# Patient Record
Sex: Female | Born: 1950 | Race: White | Hispanic: No | Marital: Married | Smoking: Never smoker
Health system: Southern US, Community
[De-identification: ages and names within clinical notes are randomized; demographics above are authoritative.]

## PROBLEM LIST (undated history)

## (undated) DIAGNOSIS — R112 Nausea with vomiting, unspecified: Secondary | ICD-10-CM

## (undated) DIAGNOSIS — Z86718 Personal history of other venous thrombosis and embolism: Secondary | ICD-10-CM

## (undated) DIAGNOSIS — D649 Anemia, unspecified: Secondary | ICD-10-CM

## (undated) DIAGNOSIS — Z8049 Family history of malignant neoplasm of other genital organs: Secondary | ICD-10-CM

## (undated) DIAGNOSIS — T8859XA Other complications of anesthesia, initial encounter: Secondary | ICD-10-CM

## (undated) DIAGNOSIS — Z8051 Family history of malignant neoplasm of kidney: Secondary | ICD-10-CM

## (undated) DIAGNOSIS — IMO0002 Reserved for concepts with insufficient information to code with codable children: Secondary | ICD-10-CM

## (undated) DIAGNOSIS — Z9889 Other specified postprocedural states: Secondary | ICD-10-CM

## (undated) DIAGNOSIS — H269 Unspecified cataract: Secondary | ICD-10-CM

## (undated) DIAGNOSIS — E039 Hypothyroidism, unspecified: Secondary | ICD-10-CM

## (undated) DIAGNOSIS — T4145XA Adverse effect of unspecified anesthetic, initial encounter: Secondary | ICD-10-CM

## (undated) DIAGNOSIS — E119 Type 2 diabetes mellitus without complications: Secondary | ICD-10-CM

## (undated) DIAGNOSIS — I890 Lymphedema, not elsewhere classified: Secondary | ICD-10-CM

## (undated) DIAGNOSIS — M1990 Unspecified osteoarthritis, unspecified site: Secondary | ICD-10-CM

## (undated) HISTORY — DX: Unspecified osteoarthritis, unspecified site: M19.90

## (undated) HISTORY — DX: Reserved for concepts with insufficient information to code with codable children: IMO0002

## (undated) HISTORY — PX: EYE SURGERY: SHX253

## (undated) HISTORY — PX: CATARACT EXTRACTION: SUR2

## (undated) HISTORY — PX: CARPAL TUNNEL RELEASE: SHX101

## (undated) HISTORY — DX: Family history of malignant neoplasm of other genital organs: Z80.49

## (undated) HISTORY — DX: Family history of malignant neoplasm of kidney: Z80.51

## (undated) HISTORY — PX: TONSILLECTOMY: SUR1361

## (undated) HISTORY — DX: Unspecified cataract: H26.9

## (undated) HISTORY — PX: KNEE CARTILAGE SURGERY: SHX688

## (undated) HISTORY — PX: TRIGGER FINGER RELEASE: SHX641

## (undated) HISTORY — PX: MOHS SURGERY: SUR867

## (undated) HISTORY — PX: ECTOPIC PREGNANCY SURGERY: SHX613

## (undated) HISTORY — PX: TUBAL LIGATION: SHX77

## (undated) MED FILL — Dexamethasone Sodium Phosphate Inj 100 MG/10ML: INTRAMUSCULAR | Qty: 1 | Status: AC

## (undated) MED FILL — Fosaprepitant Dimeglumine For IV Infusion 150 MG (Base Eq): INTRAVENOUS | Qty: 5 | Status: AC

---

## 1997-11-08 ENCOUNTER — Other Ambulatory Visit: Admission: RE | Admit: 1997-11-08 | Discharge: 1997-11-08 | Payer: Self-pay | Admitting: Obstetrics and Gynecology

## 1998-02-08 ENCOUNTER — Ambulatory Visit (HOSPITAL_COMMUNITY): Admission: RE | Admit: 1998-02-08 | Discharge: 1998-02-08 | Payer: Self-pay | Admitting: Obstetrics and Gynecology

## 1998-12-18 ENCOUNTER — Other Ambulatory Visit: Admission: RE | Admit: 1998-12-18 | Discharge: 1998-12-18 | Payer: Self-pay | Admitting: Obstetrics and Gynecology

## 1999-04-09 ENCOUNTER — Ambulatory Visit (HOSPITAL_COMMUNITY): Admission: RE | Admit: 1999-04-09 | Discharge: 1999-04-09 | Payer: Self-pay | Admitting: Obstetrics and Gynecology

## 1999-04-09 ENCOUNTER — Encounter: Payer: Self-pay | Admitting: Obstetrics and Gynecology

## 1999-12-26 ENCOUNTER — Other Ambulatory Visit: Admission: RE | Admit: 1999-12-26 | Discharge: 1999-12-26 | Payer: Self-pay | Admitting: Obstetrics and Gynecology

## 2000-11-05 ENCOUNTER — Ambulatory Visit (HOSPITAL_COMMUNITY): Admission: RE | Admit: 2000-11-05 | Discharge: 2000-11-05 | Payer: Self-pay | Admitting: Obstetrics and Gynecology

## 2000-11-05 ENCOUNTER — Encounter: Payer: Self-pay | Admitting: Obstetrics and Gynecology

## 2001-05-20 ENCOUNTER — Other Ambulatory Visit: Admission: RE | Admit: 2001-05-20 | Discharge: 2001-05-20 | Payer: Self-pay | Admitting: Obstetrics and Gynecology

## 2002-04-22 ENCOUNTER — Emergency Department (HOSPITAL_COMMUNITY): Admission: EM | Admit: 2002-04-22 | Discharge: 2002-04-22 | Payer: Self-pay | Admitting: Emergency Medicine

## 2002-06-20 ENCOUNTER — Other Ambulatory Visit: Admission: RE | Admit: 2002-06-20 | Discharge: 2002-06-20 | Payer: Self-pay | Admitting: Obstetrics and Gynecology

## 2002-09-13 ENCOUNTER — Encounter: Admission: RE | Admit: 2002-09-13 | Discharge: 2002-09-13 | Payer: Self-pay | Admitting: Obstetrics and Gynecology

## 2002-09-13 ENCOUNTER — Encounter: Payer: Self-pay | Admitting: Obstetrics and Gynecology

## 2003-01-16 ENCOUNTER — Ambulatory Visit (HOSPITAL_BASED_OUTPATIENT_CLINIC_OR_DEPARTMENT_OTHER): Admission: RE | Admit: 2003-01-16 | Discharge: 2003-01-16 | Payer: Self-pay | Admitting: Orthopedic Surgery

## 2003-08-16 ENCOUNTER — Other Ambulatory Visit: Admission: RE | Admit: 2003-08-16 | Discharge: 2003-08-16 | Payer: Self-pay | Admitting: Obstetrics and Gynecology

## 2003-09-28 ENCOUNTER — Encounter: Admission: RE | Admit: 2003-09-28 | Discharge: 2003-09-28 | Payer: Self-pay | Admitting: Obstetrics and Gynecology

## 2004-08-18 ENCOUNTER — Other Ambulatory Visit: Admission: RE | Admit: 2004-08-18 | Discharge: 2004-08-18 | Payer: Self-pay | Admitting: Obstetrics and Gynecology

## 2004-12-03 ENCOUNTER — Encounter: Admission: RE | Admit: 2004-12-03 | Discharge: 2004-12-03 | Payer: Self-pay | Admitting: Obstetrics and Gynecology

## 2005-08-20 ENCOUNTER — Other Ambulatory Visit: Admission: RE | Admit: 2005-08-20 | Discharge: 2005-08-20 | Payer: Self-pay | Admitting: Obstetrics and Gynecology

## 2005-12-17 ENCOUNTER — Encounter: Admission: RE | Admit: 2005-12-17 | Discharge: 2005-12-17 | Payer: Self-pay | Admitting: Obstetrics and Gynecology

## 2006-08-26 ENCOUNTER — Other Ambulatory Visit: Admission: RE | Admit: 2006-08-26 | Discharge: 2006-08-26 | Payer: Self-pay | Admitting: Obstetrics and Gynecology

## 2007-01-04 ENCOUNTER — Ambulatory Visit (HOSPITAL_COMMUNITY): Admission: RE | Admit: 2007-01-04 | Discharge: 2007-01-04 | Payer: Self-pay | Admitting: Obstetrics and Gynecology

## 2007-09-16 ENCOUNTER — Other Ambulatory Visit: Admission: RE | Admit: 2007-09-16 | Discharge: 2007-09-16 | Payer: Self-pay | Admitting: Obstetrics and Gynecology

## 2008-01-19 ENCOUNTER — Ambulatory Visit (HOSPITAL_COMMUNITY): Admission: RE | Admit: 2008-01-19 | Discharge: 2008-01-19 | Payer: Self-pay | Admitting: Obstetrics and Gynecology

## 2008-01-27 ENCOUNTER — Encounter: Admission: RE | Admit: 2008-01-27 | Discharge: 2008-01-27 | Payer: Self-pay | Admitting: Obstetrics and Gynecology

## 2008-07-17 ENCOUNTER — Ambulatory Visit (HOSPITAL_BASED_OUTPATIENT_CLINIC_OR_DEPARTMENT_OTHER): Admission: RE | Admit: 2008-07-17 | Discharge: 2008-07-17 | Payer: Self-pay | Admitting: Orthopedic Surgery

## 2008-10-05 ENCOUNTER — Encounter: Payer: Self-pay | Admitting: Obstetrics and Gynecology

## 2008-10-05 ENCOUNTER — Ambulatory Visit: Payer: Self-pay | Admitting: Obstetrics and Gynecology

## 2008-10-05 ENCOUNTER — Other Ambulatory Visit: Admission: RE | Admit: 2008-10-05 | Discharge: 2008-10-05 | Payer: Self-pay | Admitting: Obstetrics and Gynecology

## 2009-01-21 ENCOUNTER — Encounter: Admission: RE | Admit: 2009-01-21 | Discharge: 2009-01-21 | Payer: Self-pay | Admitting: Obstetrics and Gynecology

## 2009-10-09 ENCOUNTER — Ambulatory Visit: Payer: Self-pay | Admitting: Obstetrics and Gynecology

## 2009-10-09 ENCOUNTER — Other Ambulatory Visit: Admission: RE | Admit: 2009-10-09 | Discharge: 2009-10-09 | Payer: Self-pay | Admitting: Obstetrics and Gynecology

## 2009-11-27 ENCOUNTER — Ambulatory Visit: Payer: Self-pay | Admitting: Obstetrics and Gynecology

## 2010-01-22 ENCOUNTER — Encounter: Admission: RE | Admit: 2010-01-22 | Discharge: 2010-01-22 | Payer: Self-pay | Admitting: Obstetrics and Gynecology

## 2010-10-20 ENCOUNTER — Other Ambulatory Visit: Payer: Self-pay | Admitting: Obstetrics and Gynecology

## 2010-10-20 ENCOUNTER — Other Ambulatory Visit (HOSPITAL_COMMUNITY)
Admission: RE | Admit: 2010-10-20 | Discharge: 2010-10-20 | Disposition: A | Discharge status: Home / Self Care | Payer: BC Managed Care – PPO | Source: Ambulatory Visit | Attending: Obstetrics and Gynecology | Admitting: Obstetrics and Gynecology

## 2010-10-20 ENCOUNTER — Encounter (INDEPENDENT_AMBULATORY_CARE_PROVIDER_SITE_OTHER): Payer: BC Managed Care – PPO | Admitting: Obstetrics and Gynecology

## 2010-10-20 DIAGNOSIS — R823 Hemoglobinuria: Secondary | ICD-10-CM

## 2010-10-20 DIAGNOSIS — Z01419 Encounter for gynecological examination (general) (routine) without abnormal findings: Secondary | ICD-10-CM

## 2010-10-20 DIAGNOSIS — Z124 Encounter for screening for malignant neoplasm of cervix: Secondary | ICD-10-CM | POA: Insufficient documentation

## 2010-11-11 NOTE — Op Note (Signed)
[name], [name]          ACCOUNT NO.:  [id number]   MEDICAL RECORD NO.:  [id number]          PATIENT TYPE:  AMB   LOCATION:  DSC                          FACILITY:  [facility]   PHYSICIAN:  Katy Fitch. Sypher, M.D. DATE OF BIRTH:  01/09/1951   DATE OF PROCEDURE:  07/17/2008  DATE OF DISCHARGE:                               OPERATIVE REPORT   PREOPERATIVE DIAGNOSES:  1. Entrapped neuropathy, median nerve right carpal tunnel.  2. Chronic stenosing tenosynovitis, right first dorsal compartment.  3. 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   POSTOPERATIVE DIAGNOSES:  1. Entrapped neuropathy, median nerve right carpal tunnel.  2. Chronic stenosing tenosynovitis, right first dorsal compartment.  3. 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   OPERATIONS:  1. Release of right transcarpal ligament.  2. Release of right first dorsal compartment.  3. Release of right thumb A1 pulley, all through separate incisions.   OPERATING SURGEON:  Katy Fitch. Sypher, MD   ASSISTANT:  Marveen Reeks Dasnoit, PA-C   ANESTHESIA:  General by LMA.   SUPERVISING ANESTHESIOLOGIST:  Janetta Hora. Gelene Mink, MD   INDICATIONS:  [name] is a 60 year old woman referred for  evaluation and management of hand numbness and trigger finger/trigger  thumb symptoms.  Clinical examination confirmed right carpal tunnel  syndrome, stenosing tenosynovitis, right first dorsal compartment, and  stenosing tenosynovitis to right thumb.  She had minor stenosing  tenosynovitis of the right small finger that we will manage  nonoperatively.   Due to failed respond to nonoperative measures, she is now brought to  the operating room for release of right transcarpal ligament, release of  the first dorsal compartment, and release of the right thumb A1 pulley.   Preoperatively, electrodiagnostic studies confirmed significant  entrapped neuropathy of the right median nerve at the wrist  level.   Questions were invited and answered in detail.   PROCEDURE:  [name] was brought to the operating room and  placed in supine position on the operating table.   Following induction of general anesthesia by LMA technique, the right  arm was prepped with Betadine soap solution, sterilely draped.  A  pneumatic tourniquet was applied to proximal right brachium.  Following  exsanguination of the limb with Esmarch bandage, arterial tourniquet was  inflated to 230 mmHg.   The procedure commenced with a short incision in line of the ring finger  and the palm.  Subcutaneous tissue were carefully divided in the palmar  fascia.  This split longitudinally to the common sensory branch of the  median nerve.  These were followed back to the transcarpal ligament  where the ligament was gently separated from the median nerve proper  with a Insurance risk surveyor.  The pulley was then released with scissors  along its ulnar border extending into the distal forearm.   This widely opened carpal canal.  No mass or pigments were noted.   Bleeding points along the margin of the ligament were electrocauterized  followed by repair of the skin with intradermal 3-0 Prolene suture.   Attention was then directed to the radial aspect of the right  wrist.  A  short transverse incision was fashioned directly over the palpably  thickened first dorsal compartment retinaculum.  Subcutaneous tissues  were carefully divided taking care to gently retract the radial  superficial sensory branches.  The compartment was split with scalpel  and scissors.  The tendons were delivered.  There was a small septum  distally that was released with scissors.  There were no apparent  masses.  The wound was repaired with intradermal 3-0 Prolene.  Attention  was then directed to the right thumb.  A short transverse incision was  fashioned directly over the A1 pulley.  Subcutaneous tissues were  carefully divided  revealing the A1 pulley and the radial proper digital  nerve.  The proper digital nerve was gently retracted with a blunt  Ragnell retractor followed by splitting of the A1 pulley with scalpel  and scissors.  No tendon pathology was noted other than focal  compression.   The wound was then repaired with intradermal 3-0 Prolene.   Compressive dressings were applied to all three wounds followed by  application of voluminous gauze dressing and a volar plaster splint  maintaining the wrist in 5 degrees of dorsiflexion.  For aftercare, Ms.  Osoria was provided prescription for Percocet 5 mg one p.o. 4-6 hours  p.r.n. pain, 20 tablets without refill.      Katy Fitch Sypher, M.D.  Electronically Signed     RVS/MEDQ  D:  07/17/2008  T:  07/18/2008  Job:  [id number]

## 2010-11-14 NOTE — Op Note (Signed)
   NAME:  [name], [name]                    ACCOUNT NO.:  [id number]   MEDICAL RECORD NO.:  [id number]                   PATIENT TYPE:  AMB   LOCATION:  DSC                                  FACILITY:  [facility]   PHYSICIAN:  Katy Fitch. Naaman Plummer., M.D.          DATE OF BIRTH:  15-Sep-1950   DATE OF PROCEDURE:  01/16/2003  DATE OF DISCHARGE:                                 OPERATIVE REPORT   PREOPERATIVE DIAGNOSES:  Entrapment neuropathy of median nerve, left carpal  tunnel.   POSTOPERATIVE DIAGNOSES:  Entrapment neuropathy of median nerve, left carpal  tunnel.   OPERATION PERFORMED:  Release of left transverse carpal ligament.   SURGEON:  Katy Fitch. Sypher, M.D.   ASSISTANT:  Jonni Sanger, P.A.   ANESTHESIA:  General by LMA.   SUPERVISING ANESTHESIOLOGIST:  Guadalupe Maple, M.D.   INDICATIONS FOR PROCEDURE:  [name] is a 60 year old woman  referred for evaluation and management of hand numbness.  Clinical  examination revealed signs of carpal tunnel syndrome.  Electrodiagnostic  studies confirmed median neuropathy at the level of the transverse carpal  ligament.  After informed consent she is brought to the operating room at  this time for release of her left transverse carpal ligament.   DESCRIPTION OF PROCEDURE:  The patient was brought to the operating room and  placed in supine position upon the operating table.  Following induction of  general anesthesia, the left arm was prepped with Betadine soap and solution  and sterilely draped.  Following exsanguination of the limb with an Esmarch  bandage, an arterial tourniquet on the proximal brachium was inflated to 220  mmHg.  The procedure commenced with a short incision in line with the ring  finger in the palm.  Subcutaneous tissues are carefully divided revealing  the palmar fascia.  This was split longitudinally to reveal the common  sensory branch of the median nerve.  This was followed back to the  transverse carpal ligament which was carefully isolated from the median  nerve.  The ligament was released on its ulnar border extending to the  distal forearm.  This widely opened the carpal canal.  No masses or other  predicaments were noted.  Bleeding points along the margin of the released  ligament were electrocauterized with bipolar current.  The wound was then  repaired with intradermal 3-0 Prolene suture.   A compressive dressing was applied with a volar plaster splint maintaining  the wrist in five degrees dorsiflexion.                                                Katy Fitch Naaman Plummer., M.D.    RVS/MEDQ  D:  01/16/2003  T:  01/16/2003  Job:  [id number]

## 2011-01-13 ENCOUNTER — Other Ambulatory Visit: Payer: Self-pay | Admitting: Obstetrics and Gynecology

## 2011-01-13 DIAGNOSIS — Z1231 Encounter for screening mammogram for malignant neoplasm of breast: Secondary | ICD-10-CM

## 2011-02-06 ENCOUNTER — Ambulatory Visit
Admission: RE | Admit: 2011-02-06 | Discharge: 2011-02-06 | Disposition: A | Discharge status: Home / Self Care | Payer: BC Managed Care – PPO | Source: Ambulatory Visit | Attending: Obstetrics and Gynecology | Admitting: Obstetrics and Gynecology

## 2011-02-06 DIAGNOSIS — Z1231 Encounter for screening mammogram for malignant neoplasm of breast: Secondary | ICD-10-CM

## 2011-10-09 ENCOUNTER — Encounter: Payer: Self-pay | Admitting: Gynecology

## 2011-10-09 DIAGNOSIS — IMO0002 Reserved for concepts with insufficient information to code with codable children: Secondary | ICD-10-CM | POA: Insufficient documentation

## 2011-10-09 DIAGNOSIS — M1990 Unspecified osteoarthritis, unspecified site: Secondary | ICD-10-CM | POA: Insufficient documentation

## 2011-10-21 ENCOUNTER — Ambulatory Visit (INDEPENDENT_AMBULATORY_CARE_PROVIDER_SITE_OTHER): Payer: BC Managed Care – PPO | Admitting: Obstetrics and Gynecology

## 2011-10-21 ENCOUNTER — Other Ambulatory Visit (HOSPITAL_COMMUNITY)
Admission: RE | Admit: 2011-10-21 | Discharge: 2011-10-21 | Disposition: A | Discharge status: Home / Self Care | Payer: BC Managed Care – PPO | Source: Ambulatory Visit | Attending: Obstetrics and Gynecology | Admitting: Obstetrics and Gynecology

## 2011-10-21 ENCOUNTER — Encounter: Payer: Self-pay | Admitting: Obstetrics and Gynecology

## 2011-10-21 VITALS — BP 120/70 | Ht 63.0 in | Wt 148.0 lb

## 2011-10-21 DIAGNOSIS — Z01419 Encounter for gynecological examination (general) (routine) without abnormal findings: Secondary | ICD-10-CM | POA: Insufficient documentation

## 2011-10-21 DIAGNOSIS — Z833 Family history of diabetes mellitus: Secondary | ICD-10-CM

## 2011-10-21 NOTE — Progress Notes (Signed)
Patient came to see me today for her annual GYN exam. She is up-to-date on mammograms. She has had 2 normal bone densities. She is having no vaginal bleeding except for her occasional spotting after intercourse if she is dry. It is just a little spot that occurs when she goes to the bathroom and then is gone. They have intercourse approximately once a month. The spotting  does not happen often. She is having no pelvic pain.  Physical examination: [name] gardner present. HEENT within normal limits. Neck: Thyroid not large. No masses. Supraclavicular nodes: not enlarged. Breasts: Examined in both sitting and lying  position. No skin changes and no masses. Abdomen: Soft no guarding rebound or masses or hernia. Pelvic: External: Within normal limits. BUS: Within normal limits. Vaginal:within normal limits. Poor  estrogen effect. No evidence of cystocele rectocele or enterocele. Cervix: clean. There is some redness at 9:00 as if there's been trauma. Patient had intercourse 5 days ago and was dry and did have a little spotting afterwards. Uterus: Normal size and shape. Adnexa: No masses. Rectovaginal exam: Confirmatory and negative. Extremities: Within normal limits.  Assessment: Atrophic vaginitis  Plan: Continue yearly mammograms. Discussed the issues that she sees any spotting it makes me worry about pathology. Certainly with what she told me today that only occurs immediately after intercourse and not regularly makes me less concerned. I told her I thought would be better if she were used a  lubricant regularly or Vagifem so there would be no spotting to report. Patient will inform.

## 2011-10-22 LAB — CBC WITH DIFFERENTIAL/PLATELET
Basophils Relative: 0 % (ref 0–1)
Lymphocytes Relative: 27 % (ref 12–46)
Lymphs Abs: 1.7 10*3/uL (ref 0.7–4.0)
MCH: 29.5 pg (ref 26.0–34.0)
Neutrophils Relative %: 62 % (ref 43–77)
Platelets: 233 10*3/uL (ref 150–400)
RBC: 4.03 MIL/uL (ref 3.87–5.11)

## 2011-10-22 LAB — URINALYSIS W MICROSCOPIC + REFLEX CULTURE
Bacteria, UA: NONE SEEN
Hgb urine dipstick: NEGATIVE
Ketones, ur: NEGATIVE mg/dL
Leukocytes, UA: NEGATIVE
Nitrite: NEGATIVE
Protein, ur: NEGATIVE mg/dL
Squamous Epithelial / LPF: NONE SEEN
Urobilinogen, UA: 0.2 mg/dL (ref 0.0–1.0)

## 2011-10-22 LAB — HEMOGLOBIN A1C
Hgb A1c MFr Bld: 5.5 % (ref <5.7)
Mean Plasma Glucose: 111 mg/dL (ref <117)

## 2011-10-22 LAB — HDL CHOLESTEROL: HDL: 90 mg/dL (ref >39)

## 2011-11-30 ENCOUNTER — Other Ambulatory Visit: Payer: Self-pay | Admitting: Obstetrics and Gynecology

## 2011-11-30 DIAGNOSIS — Z13820 Encounter for screening for osteoporosis: Secondary | ICD-10-CM

## 2011-12-16 ENCOUNTER — Ambulatory Visit (INDEPENDENT_AMBULATORY_CARE_PROVIDER_SITE_OTHER): Payer: BC Managed Care – PPO

## 2011-12-16 DIAGNOSIS — Z13820 Encounter for screening for osteoporosis: Secondary | ICD-10-CM

## 2011-12-23 ENCOUNTER — Encounter: Payer: Self-pay | Admitting: Obstetrics and Gynecology

## 2012-02-11 ENCOUNTER — Other Ambulatory Visit: Payer: Self-pay | Admitting: Obstetrics and Gynecology

## 2012-02-11 DIAGNOSIS — Z1231 Encounter for screening mammogram for malignant neoplasm of breast: Secondary | ICD-10-CM

## 2012-03-02 ENCOUNTER — Ambulatory Visit
Admission: RE | Admit: 2012-03-02 | Discharge: 2012-03-02 | Disposition: A | Discharge status: Home / Self Care | Payer: BC Managed Care – PPO | Source: Ambulatory Visit | Attending: Obstetrics and Gynecology | Admitting: Obstetrics and Gynecology

## 2012-03-02 DIAGNOSIS — Z1231 Encounter for screening mammogram for malignant neoplasm of breast: Secondary | ICD-10-CM

## 2012-10-26 ENCOUNTER — Encounter: Payer: Self-pay | Admitting: Gynecology

## 2012-10-26 ENCOUNTER — Ambulatory Visit (INDEPENDENT_AMBULATORY_CARE_PROVIDER_SITE_OTHER): Payer: Self-pay | Admitting: Gynecology

## 2012-10-26 VITALS — BP 120/76 | Ht 63.0 in | Wt 144.0 lb

## 2012-10-26 DIAGNOSIS — N952 Postmenopausal atrophic vaginitis: Secondary | ICD-10-CM

## 2012-10-26 DIAGNOSIS — N951 Menopausal and female climacteric states: Secondary | ICD-10-CM

## 2012-10-26 DIAGNOSIS — Z01419 Encounter for gynecological examination (general) (routine) without abnormal findings: Secondary | ICD-10-CM

## 2012-10-26 DIAGNOSIS — Z13220 Encounter for screening for lipoid disorders: Secondary | ICD-10-CM

## 2012-10-26 LAB — CBC WITH DIFFERENTIAL/PLATELET
Basophils Relative: 1 % (ref 0–1)
Eosinophils Absolute: 0.3 10*3/uL (ref 0.0–0.7)
Eosinophils Relative: 8 % — ABNORMAL HIGH (ref 0–5)
Hemoglobin: 12.3 g/dL (ref 12.0–15.0)
Lymphocytes Relative: 32 % (ref 12–46)
Lymphs Abs: 1.2 10*3/uL (ref 0.7–4.0)
MCHC: 34.5 g/dL (ref 30.0–36.0)
MCV: 88.4 fL (ref 78.0–100.0)
Monocytes Absolute: 0.2 10*3/uL (ref 0.1–1.0)
Neutro Abs: 2 10*3/uL (ref 1.7–7.7)
RBC: 4.04 MIL/uL (ref 3.87–5.11)
WBC: 3.7 10*3/uL — ABNORMAL LOW (ref 4.0–10.5)

## 2012-10-26 NOTE — Progress Notes (Signed)
[name] 1951-03-02 [id number]        62 y.o.  [id number] for annual exam.  Several issues noted below. Former patient of Dr. Eda Paschal.  Past medical history,surgical history, medications, allergies, family history and social history were all reviewed and documented in the EPIC chart. ROS:  Was performed and pertinent positives and negatives are included in the history.  Exam: Kim assistant Filed Vitals:   10/26/12 1526  BP: 120/76  Height: 5\' 3"  (1.6 m)  Weight: 144 lb (65.318 kg)   General appearance  Normal Skin grossly normal Head/Neck normal with no cervical or supraclavicular adenopathy thyroid normal Lungs  clear Cardiac RR, without RMG Abdominal  soft, nontender, without masses, organomegaly or hernia Breasts  examined lying and sitting without masses, retractions, discharge or axillary adenopathy. Pelvic  Ext/BUS/vagina  normal with atrophic changes.  Cervix  normal with atrophic changes  Uterus  anteverted, normal size, shape and contour, midline and mobile nontender   Adnexa  Without masses or tenderness    Anus and perineum  normal   Rectovaginal  normal sphincter tone without palpated masses or tenderness.    Assessment/Plan:  62 y.o. [id number] female for annual exam.   1. Atrophic vaginal changes/menopausal symptoms. Patient does have some vaginal dryness discomfort with intercourse and some hot flashes and night sweats. Had been on HRT in the past but discontinued. Options to reinitiate HRT or vaginal estrogen discussed and declined. Recommend OTC moisturizers. Followup if wants to discuss HRT again. 2. Mammography 02/2012. Repeat this coming fall. SBE monthly reviewed. 3. DEXA 11/2011 normal. Plan repeat at 5 year interval. Check vitamin D today. Increase calcium and vitamin D reviewed. 4. Colonoscopy 7-8 years ago. Stool guaiac kit given and encouraged to return. Plan repeat colonoscopy in 10 year mark. 5. Pap smear 2013. No Pap smear done today. No history  of abnormal Pap smears. Plan repeat at 3 year interval. 6. Health maintenance. Check baseline CBC comprehensive metabolic panel lipid profile urinalysis TSH vitamin D. Followup one year, sooner as needed    Dara Lords MD, 3:54 PM 10/26/2012

## 2012-10-26 NOTE — Patient Instructions (Signed)
Follow up in one year for annual exam 

## 2012-10-27 LAB — URINALYSIS W MICROSCOPIC + REFLEX CULTURE
Casts: NONE SEEN
Hgb urine dipstick: NEGATIVE
Leukocytes, UA: NEGATIVE
Nitrite: NEGATIVE
Protein, ur: NEGATIVE mg/dL
Squamous Epithelial / LPF: NONE SEEN
Urobilinogen, UA: 0.2 mg/dL (ref 0.0–1.0)
pH: 6 (ref 5.0–8.0)

## 2012-10-27 LAB — LIPID PANEL
HDL: 68 mg/dL (ref >39)
LDL Cholesterol: 57 mg/dL (ref 0–99)
Total CHOL/HDL Ratio: 2.1 Ratio
Triglycerides: 77 mg/dL (ref <150)
VLDL: 15 mg/dL (ref 0–40)

## 2012-10-27 LAB — COMPREHENSIVE METABOLIC PANEL
ALT: 8 U/L (ref 0–35)
AST: 18 U/L (ref 0–37)
Alkaline Phosphatase: 65 U/L (ref 39–117)
CO2: 36 mEq/L — ABNORMAL HIGH (ref 19–32)
Total Bilirubin: 0.4 mg/dL (ref 0.3–1.2)
Total Protein: 6.5 g/dL (ref 6.0–8.3)

## 2012-10-28 ENCOUNTER — Other Ambulatory Visit: Payer: Self-pay | Admitting: Gynecology

## 2012-10-28 ENCOUNTER — Other Ambulatory Visit: Payer: BC Managed Care – PPO

## 2012-10-28 DIAGNOSIS — R7989 Other specified abnormal findings of blood chemistry: Secondary | ICD-10-CM

## 2012-10-28 DIAGNOSIS — D72819 Decreased white blood cell count, unspecified: Secondary | ICD-10-CM

## 2012-10-28 LAB — CBC WITH DIFFERENTIAL/PLATELET
Lymphocytes Relative: 31 % (ref 12–46)
Lymphs Abs: 1.3 10*3/uL (ref 0.7–4.0)
MCH: 30.1 pg (ref 26.0–34.0)
MCHC: 34 g/dL (ref 30.0–36.0)
Neutro Abs: 2.3 10*3/uL (ref 1.7–7.7)
Neutrophils Relative %: 54 % (ref 43–77)
WBC: 4.2 10*3/uL (ref 4.0–10.5)

## 2012-10-29 LAB — T4: T4, Total: 5.3 ug/dL (ref 5.0–12.5)

## 2012-10-29 LAB — TSH: TSH: 4.165 u[IU]/mL (ref 0.350–4.500)

## 2012-10-31 ENCOUNTER — Other Ambulatory Visit: Payer: Self-pay | Admitting: Gynecology

## 2012-10-31 ENCOUNTER — Other Ambulatory Visit: Payer: BC Managed Care – PPO

## 2012-10-31 DIAGNOSIS — D649 Anemia, unspecified: Secondary | ICD-10-CM

## 2012-11-03 ENCOUNTER — Other Ambulatory Visit: Payer: Self-pay | Admitting: Anesthesiology

## 2012-11-03 DIAGNOSIS — Z1211 Encounter for screening for malignant neoplasm of colon: Secondary | ICD-10-CM

## 2012-11-03 DIAGNOSIS — K921 Melena: Secondary | ICD-10-CM

## 2012-11-04 ENCOUNTER — Telehealth: Payer: Self-pay | Admitting: Gynecology

## 2012-11-04 NOTE — Telephone Encounter (Signed)
Tell patient that the Hemoccult blood test done on her stool was positive. Lots of reasons from hemorrhoids to polyps but one possibility is precancerous or cancerous polyps. I recommended she followup with her gastroenterologist and when she calls to make the appointment telling him that she had a positive blood check in her stool. If she has any problems arranging this then we can help her get into see a gastroenterologist.

## 2012-11-07 NOTE — Telephone Encounter (Signed)
Pt informed with the below note. 

## 2012-11-07 NOTE — Telephone Encounter (Signed)
Left message for pt to call.

## 2013-02-22 ENCOUNTER — Other Ambulatory Visit: Payer: Self-pay | Admitting: Physician Assistant

## 2013-07-11 ENCOUNTER — Other Ambulatory Visit: Payer: Self-pay

## 2013-07-11 DIAGNOSIS — Z1231 Encounter for screening mammogram for malignant neoplasm of breast: Secondary | ICD-10-CM

## 2013-07-31 ENCOUNTER — Ambulatory Visit
Admission: RE | Admit: 2013-07-31 | Discharge: 2013-07-31 | Disposition: A | Discharge status: Home / Self Care | Payer: BC Managed Care – PPO | Source: Ambulatory Visit

## 2013-07-31 DIAGNOSIS — Z1231 Encounter for screening mammogram for malignant neoplasm of breast: Secondary | ICD-10-CM

## 2013-09-26 ENCOUNTER — Ambulatory Visit (INDEPENDENT_AMBULATORY_CARE_PROVIDER_SITE_OTHER): Payer: BC Managed Care – PPO | Admitting: Gynecology

## 2013-09-26 ENCOUNTER — Encounter: Payer: Self-pay | Admitting: Gynecology

## 2013-09-26 DIAGNOSIS — R1031 Right lower quadrant pain: Secondary | ICD-10-CM

## 2013-09-26 DIAGNOSIS — M549 Dorsalgia, unspecified: Secondary | ICD-10-CM

## 2013-09-26 LAB — URINALYSIS W MICROSCOPIC + REFLEX CULTURE
Bacteria, UA: NONE SEEN
Bilirubin Urine: NEGATIVE
CASTS: NONE SEEN
CRYSTALS: NONE SEEN
GLUCOSE, UA: NEGATIVE mg/dL
Ketones, ur: NEGATIVE mg/dL
LEUKOCYTES UA: NEGATIVE
Nitrite: NEGATIVE
PH: 6.5 (ref 5.0–8.0)
Protein, ur: NEGATIVE mg/dL
SPECIFIC GRAVITY, URINE: 1.015 (ref 1.005–1.030)
Urobilinogen, UA: 0.2 mg/dL (ref 0.0–1.0)
WBC UA: NONE SEEN WBC/hpf (ref <3)

## 2013-09-26 NOTE — Patient Instructions (Signed)
Follow up for ultrasound as scheduled 

## 2013-09-26 NOTE — Progress Notes (Signed)
[name] 1951/01/03 [id number]        63 y.o.  [id number] presents with 5 day history of right lower quadrant pain. Started spontaneously and is nagging in Scientist, research (physical sciences). Seems to radiate to her back when she twists at the waist. States that it feels almost like she ovulated. No nausea vomiting diarrhea constipation or urinary symptoms such as frequency dysuria blood in the urine. Eating without difficulty. Did see Dr. Sarina Ser this past year for followup when a stool guaiac kit returned positive. She told me that he did not find anything. He did not recommend any special followup. No history of renal lithiasis or other issues.  Past medical history,surgical history, problem list, medications, allergies, family history and social history were all reviewed and documented in the EPIC chart.  Exam: Kim assistant General appearance  Normal Spine straight without CVA tenderness. Abdomen soft nontender without masses guarding rebound organomegaly. Active bowel sounds throughout. Pelvic external BUS vagina with atrophic changes. Cervix atrophic. Uterus are small size midline mobile nontender. Adnexa without masses or tenderness. Rectal exam normal.  Assessment/Plan:  63 y.o. [id number] right lower quadrant pain no localizing symptoms. Exam is normal. Urinalysis is negative. Will followup with culture regardless. Start with GYN ultrasound to rule out GYN pathology. If continues going to recommend she followup with gastroenterology.   Note: This document was prepared with digital dictation and possible smart phrase technology. Any transcriptional errors that result from this process are unintentional.   Anastasio Auerbach MD, 12:24 PM 09/26/2013

## 2013-09-26 NOTE — Addendum Note (Signed)
Addended by: Nelva Nay on: 09/26/2013 02:20 PM   Modules accepted: Orders

## 2013-09-27 ENCOUNTER — Ambulatory Visit: Payer: BC Managed Care – PPO | Admitting: Gynecology

## 2013-09-27 LAB — URINE CULTURE
COLONY COUNT: NO GROWTH
Organism ID, Bacteria: NO GROWTH

## 2013-10-11 ENCOUNTER — Other Ambulatory Visit: Payer: BC Managed Care – PPO

## 2013-10-11 ENCOUNTER — Ambulatory Visit: Payer: BC Managed Care – PPO | Admitting: Gynecology

## 2013-10-23 ENCOUNTER — Other Ambulatory Visit: Payer: BC Managed Care – PPO

## 2013-10-23 ENCOUNTER — Ambulatory Visit: Payer: BC Managed Care – PPO | Admitting: Gynecology

## 2013-10-26 ENCOUNTER — Other Ambulatory Visit: Payer: Self-pay | Admitting: Gynecology

## 2013-10-26 ENCOUNTER — Ambulatory Visit (INDEPENDENT_AMBULATORY_CARE_PROVIDER_SITE_OTHER): Payer: BC Managed Care – PPO

## 2013-10-26 ENCOUNTER — Encounter: Payer: Self-pay | Admitting: Gynecology

## 2013-10-26 ENCOUNTER — Ambulatory Visit (INDEPENDENT_AMBULATORY_CARE_PROVIDER_SITE_OTHER): Payer: BC Managed Care – PPO | Admitting: Gynecology

## 2013-10-26 DIAGNOSIS — N83339 Acquired atrophy of ovary and fallopian tube, unspecified side: Secondary | ICD-10-CM

## 2013-10-26 DIAGNOSIS — R1031 Right lower quadrant pain: Secondary | ICD-10-CM

## 2013-10-26 NOTE — Progress Notes (Addendum)
[name] 1951/05/08 [id number]        63 y.o.  [id number] presents for followup ultrasound due to right lower quadrant pain. She saw Dr. Watt Climes who did not feel that it was GI. Patient does note worsening pain developing nausea and then had explosive diarrhea lasting several days. Her pain seems to be getting better.  Past medical history,surgical history, problem list, medications, allergies, family history and social history were all reviewed and documented in the EPIC chart.  Directed ROS with pertinent positives and negatives documented in the history of present illness/assessment and plan.  Ultrasound shows uterus normal size and echotexture. Right and left ovaries atrophic. Endometrial echo 0.8 mm. Cul-de-sac negative.  Assessment/Plan:  63 y.o. [id number] right lower quadrant pain with associated GI symptomatology. Has seen gastroenterology and at that point did not feel it was gastroenterology. Certainly historically still sounds GI related. If her pain persists I asked her called the gastroenterologist again. I think CT scan should be the next step and she did say that he mentioned this. The patient does have her annual exam scheduled with me next week and will follow up for this.    Note: This document was prepared with digital dictation and possible smart phrase technology. Any transcriptional errors that result from this process are unintentional.   Anastasio Auerbach MD, 2:47 PM 10/26/2013

## 2013-10-26 NOTE — Patient Instructions (Signed)
Followup with Dr. Watt Climes if your pain continues. Followup in one week for your annual exam as scheduled

## 2013-11-01 ENCOUNTER — Encounter: Payer: Self-pay | Admitting: Gynecology

## 2013-11-01 ENCOUNTER — Ambulatory Visit (INDEPENDENT_AMBULATORY_CARE_PROVIDER_SITE_OTHER): Payer: BC Managed Care – PPO | Admitting: Gynecology

## 2013-11-01 VITALS — BP 120/76 | Ht 63.0 in | Wt 148.0 lb

## 2013-11-01 DIAGNOSIS — Z01419 Encounter for gynecological examination (general) (routine) without abnormal findings: Secondary | ICD-10-CM

## 2013-11-01 DIAGNOSIS — N952 Postmenopausal atrophic vaginitis: Secondary | ICD-10-CM

## 2013-11-01 LAB — CBC WITH DIFFERENTIAL/PLATELET
BASOS PCT: 1 % (ref 0–1)
Basophils Absolute: 0 10*3/uL (ref 0.0–0.1)
EOS ABS: 0.2 10*3/uL (ref 0.0–0.7)
EOS PCT: 5 % (ref 0–5)
HCT: 34.6 % — ABNORMAL LOW (ref 36.0–46.0)
Hemoglobin: 12 g/dL (ref 12.0–15.0)
Lymphocytes Relative: 30 % (ref 12–46)
Lymphs Abs: 1.4 10*3/uL (ref 0.7–4.0)
MCH: 30.3 pg (ref 26.0–34.0)
MCHC: 34.7 g/dL (ref 30.0–36.0)
MCV: 87.4 fL (ref 78.0–100.0)
MONOS PCT: 8 % (ref 3–12)
Monocytes Absolute: 0.4 10*3/uL (ref 0.1–1.0)
Neutro Abs: 2.5 10*3/uL (ref 1.7–7.7)
Neutrophils Relative %: 56 % (ref 43–77)
Platelets: 210 10*3/uL (ref 150–400)
RBC: 3.96 MIL/uL (ref 3.87–5.11)
RDW: 13.1 % (ref 11.5–15.5)
WBC: 4.5 10*3/uL (ref 4.0–10.5)

## 2013-11-01 NOTE — Patient Instructions (Signed)
Followup in one year for annual exam. Report any vaginal bleeding.  You may obtain a copy of any labs that were done today by logging onto MyChart as outlined in the instructions provided with your AVS (after visit summary). The office will not call with normal lab results but certainly if there are any significant abnormalities then we will contact you.   Health Maintenance, Female A healthy lifestyle and preventative care can promote health and wellness.  Maintain regular health, dental, and eye exams.  Eat a healthy diet. Foods like vegetables, fruits, whole grains, low-fat dairy products, and lean protein foods contain the nutrients you need without too many calories. Decrease your intake of foods high in solid fats, added sugars, and salt. Get information about a proper diet from your caregiver, if necessary.  Regular physical exercise is one of the most important things you can do for your health. Most adults should get at least 150 minutes of moderate-intensity exercise (any activity that increases your heart rate and causes you to sweat) each week. In addition, most adults need muscle-strengthening exercises on 2 or more days a week.   Maintain a healthy weight. The body mass index (BMI) is a screening tool to identify possible weight problems. It provides an estimate of body fat based on height and weight. Your caregiver can help determine your BMI, and can help you achieve or maintain a healthy weight. For adults 20 years and older:  A BMI below 18.5 is considered underweight.  A BMI of 18.5 to 24.9 is normal.  A BMI of 25 to 29.9 is considered overweight.  A BMI of 30 and above is considered obese.  Maintain normal blood lipids and cholesterol by exercising and minimizing your intake of saturated fat. Eat a balanced diet with plenty of fruits and vegetables. Blood tests for lipids and cholesterol should begin at age 51 and be repeated every 5 years. If your lipid or cholesterol  levels are high, you are over 50, or you are a high risk for heart disease, you may need your cholesterol levels checked more frequently.Ongoing high lipid and cholesterol levels should be treated with medicines if diet and exercise are not effective.  If you smoke, find out from your caregiver how to quit. If you do not use tobacco, do not start.  Lung cancer screening is recommended for adults aged 56 80 years who are at high risk for developing lung cancer because of a history of smoking. Yearly low-dose computed tomography (CT) is recommended for people who have at least a 30-pack-year history of smoking and are a current smoker or have quit within the past 15 years. A pack year of smoking is smoking an average of 1 pack of cigarettes a day for 1 year (for example: 1 pack a day for 30 years or 2 packs a day for 15 years). Yearly screening should continue until the smoker has stopped smoking for at least 15 years. Yearly screening should also be stopped for people who develop a health problem that would prevent them from having lung cancer treatment.  If you are pregnant, do not drink alcohol. If you are breastfeeding, be very cautious about drinking alcohol. If you are not pregnant and choose to drink alcohol, do not exceed 1 drink per day. One drink is considered to be 12 ounces (355 mL) of beer, 5 ounces (148 mL) of wine, or 1.5 ounces (44 mL) of liquor.  Avoid use of street drugs. Do not share needles with  anyone. Ask for help if you need support or instructions about stopping the use of drugs.  High blood pressure causes heart disease and increases the risk of stroke. Blood pressure should be checked at least every 1 to 2 years. Ongoing high blood pressure should be treated with medicines, if weight loss and exercise are not effective.  If you are 55 to 61 years old, ask your caregiver if you should take aspirin to prevent strokes.  Diabetes screening involves taking a blood sample to check  your fasting blood sugar level. This should be done once every 3 years, after age 45, if you are within normal weight and without risk factors for diabetes. Testing should be considered at a younger age or be carried out more frequently if you are overweight and have at least 1 risk factor for diabetes.  Breast cancer screening is essential preventative care for women. You should practice "breast self-awareness." This means understanding the normal appearance and feel of your breasts and may include breast self-examination. Any changes detected, no matter how small, should be reported to a caregiver. Women in their 20s and 30s should have a clinical breast exam (CBE) by a caregiver as part of a regular health exam every 1 to 3 years. After age 40, women should have a CBE every year. Starting at age 40, women should consider having a mammogram (breast X-ray) every year. Women who have a family history of breast cancer should talk to their caregiver about genetic screening. Women at a high risk of breast cancer should talk to their caregiver about having an MRI and a mammogram every year.  Breast cancer gene (BRCA)-related cancer risk assessment is recommended for women who have family members with BRCA-related cancers. BRCA-related cancers include breast, ovarian, tubal, and peritoneal cancers. Having family members with these cancers may be associated with an increased risk for harmful changes (mutations) in the breast cancer genes BRCA1 and BRCA2. Results of the assessment will determine the need for genetic counseling and BRCA1 and BRCA2 testing.  The Pap test is a screening test for cervical cancer. Women should have a Pap test starting at age 21. Between ages 21 and 29, Pap tests should be repeated every 2 years. Beginning at age 30, you should have a Pap test every 3 years as long as the past 3 Pap tests have been normal. If you had a hysterectomy for a problem that was not cancer or a condition that  could lead to cancer, then you no longer need Pap tests. If you are between ages 65 and 70, and you have had normal Pap tests going back 10 years, you no longer need Pap tests. If you have had past treatment for cervical cancer or a condition that could lead to cancer, you need Pap tests and screening for cancer for at least 20 years after your treatment. If Pap tests have been discontinued, risk factors (such as a new sexual partner) need to be reassessed to determine if screening should be resumed. Some women have medical problems that increase the chance of getting cervical cancer. In these cases, your caregiver may recommend more frequent screening and Pap tests.  The human papillomavirus (HPV) test is an additional test that may be used for cervical cancer screening. The HPV test looks for the virus that can cause the cell changes on the cervix. The cells collected during the Pap test can be tested for HPV. The HPV test could be used to screen women aged 30   years and older, and should be used in women of any age who have unclear Pap test results. After the age of 30, women should have HPV testing at the same frequency as a Pap test.  Colorectal cancer can be detected and often prevented. Most routine colorectal cancer screening begins at the age of 50 and continues through age 75. However, your caregiver may recommend screening at an earlier age if you have risk factors for colon cancer. On a yearly basis, your caregiver may provide home test kits to check for hidden blood in the stool. Use of a small camera at the end of a tube, to directly examine the colon (sigmoidoscopy or colonoscopy), can detect the earliest forms of colorectal cancer. Talk to your caregiver about this at age 50, when routine screening begins. Direct examination of the colon should be repeated every 5 to 10 years through age 75, unless early forms of pre-cancerous polyps or small growths are found.  Hepatitis C blood testing is  recommended for all people born from 1945 through 1965 and any individual with known risks for hepatitis C.  Practice safe sex. Use condoms and avoid high-risk sexual practices to reduce the spread of sexually transmitted infections (STIs). Sexually active women aged 25 and younger should be checked for Chlamydia, which is a common sexually transmitted infection. Older women with new or multiple partners should also be tested for Chlamydia. Testing for other STIs is recommended if you are sexually active and at increased risk.  Osteoporosis is a disease in which the bones lose minerals and strength with aging. This can result in serious bone fractures. The risk of osteoporosis can be identified using a bone density scan. Women ages 65 and over and women at risk for fractures or osteoporosis should discuss screening with their caregivers. Ask your caregiver whether you should be taking a calcium supplement or vitamin D to reduce the rate of osteoporosis.  Menopause can be associated with physical symptoms and risks. Hormone replacement therapy is available to decrease symptoms and risks. You should talk to your caregiver about whether hormone replacement therapy is right for you.  Use sunscreen. Apply sunscreen liberally and repeatedly throughout the day. You should seek shade when your shadow is shorter than you. Protect yourself by wearing long sleeves, pants, a wide-brimmed hat, and sunglasses year round, whenever you are outdoors.  Notify your caregiver of new moles or changes in moles, especially if there is a change in shape or color. Also notify your caregiver if a mole is larger than the size of a pencil eraser.  Stay current with your immunizations. Document Released: 12/29/2010 Document Revised: 10/10/2012 Document Reviewed: 12/29/2010 [employer] Patient Information 2014 [employer].   

## 2013-11-01 NOTE — Progress Notes (Signed)
[name] 08/23/1950 [id number]        63 y.o.  [id number] for annual exam.  Several issues noted below.  Past medical history,surgical history, problem list, medications, allergies, family history and social history were all reviewed and documented as reviewed in the EPIC chart.  ROS:  12 system ROS performed with pertinent positives and negatives included in the history, assessment and plan.  Included Systems: General, HEENT, Neck, Cardiovascular, Pulmonary, Gastrointestinal, Genitourinary, Musculoskeletal, Dermatologic, Endocrine, Hematological, Neurologic, Psychiatric Additional significant findings :  None   Exam: Kim assistant Filed Vitals:   11/01/13 1459  BP: 120/76  Height: 5\' 3"  (1.6 m)  Weight: 148 lb (67.132 kg)   General appearance:  Normal affect, orientation and appearance. Skin: Grossly normal HEENT: Without gross lesions.  No cervical or supraclavicular adenopathy. Thyroid normal.  Lungs:  Clear without wheezing, rales or rhonchi Cardiac: RR, without RMG Abdominal:  Soft, nontender, without masses, guarding, rebound, organomegaly or hernia Breasts:  Examined lying and sitting without masses, retractions, discharge or axillary adenopathy. Pelvic:  Ext/BUS/vagina with generalized atrophic changes  Cervix with atrophic changes  Uterus anteverted, normal size, shape and contour, midline and mobile nontender   Adnexa  Without masses or tenderness    Anus and perineum  Normal   Rectovaginal  Normal sphincter tone without palpated masses or tenderness.    Assessment/Plan:  63 y.o. [id number] female for annual exam.   1. Postmenopausal/attributable to changes. Is having some hot flashes and vaginal dryness. Had been on HRT in the past. Is not interested in any prescription treatment. Would prefer just to monitor. No vaginal bleeding. Patient will call if symptoms worsen if she wants to consider treatment or she has any vaginal bleeding. 2. Right lower quadrant pain.  Recent evaluation included a negative ultrasound. Patient notes that her pain is actually getting better she can continue to monitor at this time. As long as her results and she'll follow expectantly. 3. Pap smear 2013. No Pap smear done today. No history of abnormal Pap smears previously. Plan repeat Pap smear next year at 3 year interval. 4. DEXA 2013 normal. Plan repeat at age 55. Increase calcium vitamin D reviewed. 5. Colonoscopy reported 2010. Reports recommended repeat interval 10 years. 6. Mammography 07/2013. Continue with annual mammography. SBE monthly reviewed. 7. Health maintenance. Patient requests baseline labs. CBC comprehensive metabolic panel lipid profile urinalysis TSH vitamin D ordered. Followup one year, sooner as needed. 8.    Note: This document was prepared with digital dictation and possible smart phrase technology. Any transcriptional errors that result from this process are unintentional.   Anastasio Auerbach MD, 3:44 PM 11/01/2013

## 2013-11-02 LAB — COMPREHENSIVE METABOLIC PANEL
ALT: 11 U/L (ref 0–35)
AST: 19 U/L (ref 0–37)
Albumin: 4.1 g/dL (ref 3.5–5.2)
Alkaline Phosphatase: 74 U/L (ref 39–117)
BILIRUBIN TOTAL: 0.5 mg/dL (ref 0.2–1.2)
BUN: 15 mg/dL (ref 6–23)
CO2: 28 mEq/L (ref 19–32)
CREATININE: 0.74 mg/dL (ref 0.50–1.10)
Calcium: 9.4 mg/dL (ref 8.4–10.5)
Chloride: 101 mEq/L (ref 96–112)
GLUCOSE: 73 mg/dL (ref 70–99)
Potassium: 4.2 mEq/L (ref 3.5–5.3)
Sodium: 139 mEq/L (ref 135–145)
TOTAL PROTEIN: 6.5 g/dL (ref 6.0–8.3)

## 2013-11-02 LAB — URINALYSIS W MICROSCOPIC + REFLEX CULTURE
BILIRUBIN URINE: NEGATIVE
Bacteria, UA: NONE SEEN
Casts: NONE SEEN
Glucose, UA: NEGATIVE mg/dL
Hgb urine dipstick: NEGATIVE
Ketones, ur: NEGATIVE mg/dL
LEUKOCYTES UA: NEGATIVE
Nitrite: NEGATIVE
Protein, ur: NEGATIVE mg/dL
SPECIFIC GRAVITY, URINE: 1.017 (ref 1.005–1.030)
SQUAMOUS EPITHELIAL / LPF: NONE SEEN
UROBILINOGEN UA: 0.2 mg/dL (ref 0.0–1.0)
pH: 5.5 (ref 5.0–8.0)

## 2013-11-02 LAB — LIPID PANEL
CHOL/HDL RATIO: 2.2 ratio
CHOLESTEROL: 140 mg/dL (ref 0–200)
HDL: 63 mg/dL (ref >39)
LDL Cholesterol: 53 mg/dL (ref 0–99)
TRIGLYCERIDES: 122 mg/dL (ref <150)
VLDL: 24 mg/dL (ref 0–40)

## 2013-11-02 LAB — TSH: TSH: 4.208 u[IU]/mL (ref 0.350–4.500)

## 2013-11-02 LAB — VITAMIN D 25 HYDROXY (VIT D DEFICIENCY, FRACTURES): Vit D, 25-Hydroxy: 59 ng/mL (ref 30–89)

## 2014-03-28 ENCOUNTER — Ambulatory Visit: Payer: Self-pay | Admitting: Ophthalmology

## 2014-04-30 ENCOUNTER — Encounter: Payer: Self-pay | Admitting: Gynecology

## 2014-09-21 ENCOUNTER — Other Ambulatory Visit: Payer: Self-pay

## 2014-09-21 DIAGNOSIS — Z1231 Encounter for screening mammogram for malignant neoplasm of breast: Secondary | ICD-10-CM

## 2014-10-03 ENCOUNTER — Ambulatory Visit
Admission: RE | Admit: 2014-10-03 | Discharge: 2014-10-03 | Disposition: A | Discharge status: Home / Self Care | Payer: BC Managed Care – PPO | Source: Ambulatory Visit

## 2014-10-03 DIAGNOSIS — Z1231 Encounter for screening mammogram for malignant neoplasm of breast: Secondary | ICD-10-CM

## 2014-11-07 ENCOUNTER — Ambulatory Visit (INDEPENDENT_AMBULATORY_CARE_PROVIDER_SITE_OTHER): Payer: BC Managed Care – PPO | Admitting: Gynecology

## 2014-11-07 ENCOUNTER — Encounter: Payer: Self-pay | Admitting: Gynecology

## 2014-11-07 ENCOUNTER — Other Ambulatory Visit (HOSPITAL_COMMUNITY)
Admission: RE | Admit: 2014-11-07 | Discharge: 2014-11-07 | Disposition: A | Discharge status: Home / Self Care | Payer: BC Managed Care – PPO | Source: Ambulatory Visit | Attending: Gynecology | Admitting: Gynecology

## 2014-11-07 VITALS — BP 120/70 | Ht 63.0 in | Wt 150.0 lb

## 2014-11-07 DIAGNOSIS — Z01419 Encounter for gynecological examination (general) (routine) without abnormal findings: Secondary | ICD-10-CM | POA: Diagnosis not present

## 2014-11-07 DIAGNOSIS — N952 Postmenopausal atrophic vaginitis: Secondary | ICD-10-CM

## 2014-11-07 LAB — CBC WITH DIFFERENTIAL/PLATELET
Basophils Absolute: 0 10*3/uL (ref 0.0–0.1)
Basophils Relative: 0 % (ref 0–1)
EOS ABS: 0.1 10*3/uL (ref 0.0–0.7)
EOS PCT: 2 % (ref 0–5)
HCT: 37.2 % (ref 36.0–46.0)
HEMOGLOBIN: 12.5 g/dL (ref 12.0–15.0)
LYMPHS ABS: 1.4 10*3/uL (ref 0.7–4.0)
Lymphocytes Relative: 28 % (ref 12–46)
MCH: 30.3 pg (ref 26.0–34.0)
MCHC: 33.6 g/dL (ref 30.0–36.0)
MCV: 90.3 fL (ref 78.0–100.0)
MONO ABS: 0.4 10*3/uL (ref 0.1–1.0)
MPV: 10.2 fL (ref 8.6–12.4)
Monocytes Relative: 9 % (ref 3–12)
Neutro Abs: 3 10*3/uL (ref 1.7–7.7)
Neutrophils Relative %: 61 % (ref 43–77)
Platelets: 249 10*3/uL (ref 150–400)
RBC: 4.12 MIL/uL (ref 3.87–5.11)
RDW: 12.8 % (ref 11.5–15.5)
WBC: 4.9 10*3/uL (ref 4.0–10.5)

## 2014-11-07 LAB — COMPREHENSIVE METABOLIC PANEL
ALBUMIN: 4.1 g/dL (ref 3.5–5.2)
ALK PHOS: 68 U/L (ref 39–117)
ALT: 11 U/L (ref 0–35)
AST: 18 U/L (ref 0–37)
BUN: 15 mg/dL (ref 6–23)
CO2: 29 meq/L (ref 19–32)
Calcium: 9.5 mg/dL (ref 8.4–10.5)
Chloride: 101 mEq/L (ref 96–112)
Creat: 0.74 mg/dL (ref 0.50–1.10)
GLUCOSE: 83 mg/dL (ref 70–99)
Potassium: 4.2 mEq/L (ref 3.5–5.3)
Sodium: 139 mEq/L (ref 135–145)
TOTAL PROTEIN: 6.6 g/dL (ref 6.0–8.3)
Total Bilirubin: 0.5 mg/dL (ref 0.2–1.2)

## 2014-11-07 LAB — LIPID PANEL
CHOL/HDL RATIO: 2.2 ratio
Cholesterol: 156 mg/dL (ref 0–200)
HDL: 72 mg/dL (ref >=46)
LDL Cholesterol: 65 mg/dL (ref 0–99)
Triglycerides: 96 mg/dL (ref <150)
VLDL: 19 mg/dL (ref 0–40)

## 2014-11-07 LAB — TSH: TSH: 4.637 u[IU]/mL — ABNORMAL HIGH (ref 0.350–4.500)

## 2014-11-07 NOTE — Progress Notes (Signed)
[name] 04/29/1951 [id number]        64 y.o.  [id number] for annual exam.  Doing well without complaints.  Past medical history,surgical history, problem list, medications, allergies, family history and social history were all reviewed and documented as reviewed in the EPIC chart.  ROS:  Performed with pertinent positives and negatives included in the history, assessment and plan.   Additional significant findings :  none   Exam: Kim Counsellor Vitals:   11/07/14 1528  BP: 120/70  Height: 5\' 3"  (1.6 m)  Weight: 150 lb (68.04 kg)   General appearance:  Normal affect, orientation and appearance. Skin: Grossly normal HEENT: Without gross lesions.  No cervical or supraclavicular adenopathy. Thyroid normal.  Lungs:  Clear without wheezing, rales or rhonchi Cardiac: RR, without RMG Abdominal:  Soft, nontender, without masses, guarding, rebound, organomegaly or hernia Breasts:  Examined lying and sitting without masses, retractions, discharge or axillary adenopathy. Pelvic:  Ext/BUS/vagina with atrophic changes  Cervix with atrophic changes. Pap smear done. Stenotic os noted.  Uterus axial to anteverted, normal size, shape and contour, midline and mobile nontender   Adnexa  Without masses or tenderness    Anus and perineum  Normal   Rectovaginal  Normal sphincter tone without palpated masses or tenderness.    Assessment/Plan:  64 y.o. [id number] female for annual exam.   1. Postmenopausal/atrophic genital changes. Still having some hot flashes. No vaginal bleeding or vaginal dryness. Not interested in medication such as ERT. Continue to monitor. Report any vaginal bleeding. 2. Mammography 09/2014. Continue with annual mammography. SBE monthly reviewed. 3. DEXA 2013 normal. Plan repeat DEXA at age 89. Check vitamin D level today. 4. Colonoscopy 2010 with recommended repeat interval reported at 10 years. 5. Pap smear 2013. Pap smear done today. No history of abnormal Pap  smears previously. 6. Health maintenance. Patient requests routine blood work. CBC comprehensive metabolic panel lipid profile urinalysis TSH vitamin D ordered. Follow up in one year, sooner as needed.     Anastasio Auerbach MD, 4:03 PM 11/07/2014

## 2014-11-07 NOTE — Addendum Note (Signed)
Addended by: Nelva Nay on: 11/07/2014 04:07 PM   Modules accepted: Orders

## 2014-11-07 NOTE — Patient Instructions (Signed)
You may obtain a copy of any labs that were done today by logging onto MyChart as outlined in the instructions provided with your AVS (after visit summary). The office will not call with normal lab results but certainly if there are any significant abnormalities then we will contact you.   Health Maintenance, Female A healthy lifestyle and preventative care can promote health and wellness.  Maintain regular health, dental, and eye exams.  Eat a healthy diet. Foods like vegetables, fruits, whole grains, low-fat dairy products, and lean protein foods contain the nutrients you need without too many calories. Decrease your intake of foods high in solid fats, added sugars, and salt. Get information about a proper diet from your caregiver, if necessary.  Regular physical exercise is one of the most important things you can do for your health. Most adults should get at least 150 minutes of moderate-intensity exercise (any activity that increases your heart rate and causes you to sweat) each week. In addition, most adults need muscle-strengthening exercises on 2 or more days a week.   Maintain a healthy weight. The body mass index (BMI) is a screening tool to identify possible weight problems. It provides an estimate of body fat based on height and weight. Your caregiver can help determine your BMI, and can help you achieve or maintain a healthy weight. For adults 20 years and older:  A BMI below 18.5 is considered underweight.  A BMI of 18.5 to 24.9 is normal.  A BMI of 25 to 29.9 is considered overweight.  A BMI of 30 and above is considered obese.  Maintain normal blood lipids and cholesterol by exercising and minimizing your intake of saturated fat. Eat a balanced diet with plenty of fruits and vegetables. Blood tests for lipids and cholesterol should begin at age 61 and be repeated every 5 years. If your lipid or cholesterol levels are high, you are over 50, or you are a high risk for heart  disease, you may need your cholesterol levels checked more frequently.Ongoing high lipid and cholesterol levels should be treated with medicines if diet and exercise are not effective.  If you smoke, find out from your caregiver how to quit. If you do not use tobacco, do not start.  Lung cancer screening is recommended for adults aged 33 80 years who are at high risk for developing lung cancer because of a history of smoking. Yearly low-dose computed tomography (CT) is recommended for people who have at least a 30-pack-year history of smoking and are a current smoker or have quit within the past 15 years. A pack year of smoking is smoking an average of 1 pack of cigarettes a day for 1 year (for example: 1 pack a day for 30 years or 2 packs a day for 15 years). Yearly screening should continue until the smoker has stopped smoking for at least 15 years. Yearly screening should also be stopped for people who develop a health problem that would prevent them from having lung cancer treatment.  If you are pregnant, do not drink alcohol. If you are breastfeeding, be very cautious about drinking alcohol. If you are not pregnant and choose to drink alcohol, do not exceed 1 drink per day. One drink is considered to be 12 ounces (355 mL) of beer, 5 ounces (148 mL) of wine, or 1.5 ounces (44 mL) of liquor.  Avoid use of street drugs. Do not share needles with anyone. Ask for help if you need support or instructions about stopping  the use of drugs.  High blood pressure causes heart disease and increases the risk of stroke. Blood pressure should be checked at least every 1 to 2 years. Ongoing high blood pressure should be treated with medicines, if weight loss and exercise are not effective.  If you are 59 to 62 years old, ask your caregiver if you should take aspirin to prevent strokes.  Diabetes screening involves taking a blood sample to check your fasting blood sugar level. This should be done once every 3  years, after age 91, if you are within normal weight and without risk factors for diabetes. Testing should be considered at a younger age or be carried out more frequently if you are overweight and have at least 1 risk factor for diabetes.  Breast cancer screening is essential preventative care for women. You should practice "breast self-awareness." This means understanding the normal appearance and feel of your breasts and may include breast self-examination. Any changes detected, no matter how small, should be reported to a caregiver. Women in their 66s and 30s should have a clinical breast exam (CBE) by a caregiver as part of a regular health exam every 1 to 3 years. After age 101, women should have a CBE every year. Starting at age 100, women should consider having a mammogram (breast X-ray) every year. Women who have a family history of breast cancer should talk to their caregiver about genetic screening. Women at a high risk of breast cancer should talk to their caregiver about having an MRI and a mammogram every year.  Breast cancer gene (BRCA)-related cancer risk assessment is recommended for women who have family members with BRCA-related cancers. BRCA-related cancers include breast, ovarian, tubal, and peritoneal cancers. Having family members with these cancers may be associated with an increased risk for harmful changes (mutations) in the breast cancer genes BRCA1 and BRCA2. Results of the assessment will determine the need for genetic counseling and BRCA1 and BRCA2 testing.  The Pap test is a screening test for cervical cancer. Women should have a Pap test starting at age 57. Between ages 25 and 35, Pap tests should be repeated every 2 years. Beginning at age 37, you should have a Pap test every 3 years as long as the past 3 Pap tests have been normal. If you had a hysterectomy for a problem that was not cancer or a condition that could lead to cancer, then you no longer need Pap tests. If you are  between ages 50 and 76, and you have had normal Pap tests going back 10 years, you no longer need Pap tests. If you have had past treatment for cervical cancer or a condition that could lead to cancer, you need Pap tests and screening for cancer for at least 20 years after your treatment. If Pap tests have been discontinued, risk factors (such as a new sexual partner) need to be reassessed to determine if screening should be resumed. Some women have medical problems that increase the chance of getting cervical cancer. In these cases, your caregiver may recommend more frequent screening and Pap tests.  The human papillomavirus (HPV) test is an additional test that may be used for cervical cancer screening. The HPV test looks for the virus that can cause the cell changes on the cervix. The cells collected during the Pap test can be tested for HPV. The HPV test could be used to screen women aged 44 years and older, and should be used in women of any age  who have unclear Pap test results. After the age of 55, women should have HPV testing at the same frequency as a Pap test.  Colorectal cancer can be detected and often prevented. Most routine colorectal cancer screening begins at the age of 44 and continues through age 20. However, your caregiver may recommend screening at an earlier age if you have risk factors for colon cancer. On a yearly basis, your caregiver may provide home test kits to check for hidden blood in the stool. Use of a small camera at the end of a tube, to directly examine the colon (sigmoidoscopy or colonoscopy), can detect the earliest forms of colorectal cancer. Talk to your caregiver about this at age 86, when routine screening begins. Direct examination of the colon should be repeated every 5 to 10 years through age 13, unless early forms of pre-cancerous polyps or small growths are found.  Hepatitis C blood testing is recommended for all people born from [birth year] through 1963 and any  individual with known risks for hepatitis C.  Practice safe sex. Use condoms and avoid high-risk sexual practices to reduce the spread of sexually transmitted infections (STIs). Sexually active women aged 36 and younger should be checked for Chlamydia, which is a common sexually transmitted infection. Older women with new or multiple partners should also be tested for Chlamydia. Testing for other STIs is recommended if you are sexually active and at increased risk.  Osteoporosis is a disease in which the bones lose minerals and strength with aging. This can result in serious bone fractures. The risk of osteoporosis can be identified using a bone density scan. Women ages 20 and over and women at risk for fractures or osteoporosis should discuss screening with their caregivers. Ask your caregiver whether you should be taking a calcium supplement or vitamin D to reduce the rate of osteoporosis.  Menopause can be associated with physical symptoms and risks. Hormone replacement therapy is available to decrease symptoms and risks. You should talk to your caregiver about whether hormone replacement therapy is right for you.  Use sunscreen. Apply sunscreen liberally and repeatedly throughout the day. You should seek shade when your shadow is shorter than you. Protect yourself by wearing long sleeves, pants, a wide-brimmed hat, and sunglasses year round, whenever you are outdoors.  Notify your caregiver of new moles or changes in moles, especially if there is a change in shape or color. Also notify your caregiver if a mole is larger than the size of a pencil eraser.  Stay current with your immunizations. Document Released: 12/29/2010 Document Revised: 10/10/2012 Document Reviewed: 12/29/2010 [facility] Patient Information 2014 [place].

## 2014-11-08 ENCOUNTER — Other Ambulatory Visit: Payer: Self-pay | Admitting: Gynecology

## 2014-11-08 DIAGNOSIS — R7989 Other specified abnormal findings of blood chemistry: Secondary | ICD-10-CM

## 2014-11-08 LAB — URINALYSIS W MICROSCOPIC + REFLEX CULTURE
Bacteria, UA: NONE SEEN
Bilirubin Urine: NEGATIVE
Casts: NONE SEEN
Crystals: NONE SEEN
Glucose, UA: NEGATIVE mg/dL
Hgb urine dipstick: NEGATIVE
Ketones, ur: NEGATIVE mg/dL
Leukocytes, UA: NEGATIVE
Nitrite: NEGATIVE
PROTEIN: NEGATIVE mg/dL
SQUAMOUS EPITHELIAL / LPF: NONE SEEN
Specific Gravity, Urine: 1.019 (ref 1.005–1.030)
UROBILINOGEN UA: 0.2 mg/dL (ref 0.0–1.0)
pH: 5 (ref 5.0–8.0)

## 2014-11-08 LAB — VITAMIN D 25 HYDROXY (VIT D DEFICIENCY, FRACTURES): VIT D 25 HYDROXY: 50 ng/mL (ref 30–100)

## 2014-11-09 LAB — CYTOLOGY - PAP

## 2014-11-12 ENCOUNTER — Other Ambulatory Visit: Payer: BC Managed Care – PPO

## 2014-11-12 DIAGNOSIS — R7989 Other specified abnormal findings of blood chemistry: Secondary | ICD-10-CM

## 2014-11-12 LAB — THYROID PANEL WITH TSH
FREE THYROXINE INDEX: 1.4 (ref 1.4–3.8)
T3 Uptake: 28 % (ref 22–35)
T4, Total: 4.9 ug/dL (ref 4.5–12.0)
TSH: 5.026 u[IU]/mL — AB (ref 0.350–4.500)

## 2014-11-13 ENCOUNTER — Telehealth: Payer: Self-pay | Admitting: *Deleted

## 2014-11-13 DIAGNOSIS — E038 Other specified hypothyroidism: Secondary | ICD-10-CM

## 2014-11-13 NOTE — Telephone Encounter (Signed)
Referral placed Dr.Gherge office will contact pt to schedule. 

## 2014-11-13 NOTE — Telephone Encounter (Signed)
Tell patient her TSH is elevated. This is consistent with the beginning of hypothyroidism. I think for completeness I would recommend seeing an endocrinologist at least for the first time to make sure nothing else needs to be done. I suspect she does need to be on thyroid replacement. Schedule an appointment with Dr. Cruzita Lederer.

## 2014-11-14 NOTE — Telephone Encounter (Signed)
Appointment 01/14/15 @ 11:15am

## 2014-11-30 ENCOUNTER — Ambulatory Visit: Payer: BC Managed Care – PPO | Admitting: Endocrinology

## 2014-11-30 ENCOUNTER — Encounter: Payer: Self-pay | Admitting: Endocrinology

## 2014-11-30 ENCOUNTER — Ambulatory Visit (INDEPENDENT_AMBULATORY_CARE_PROVIDER_SITE_OTHER): Payer: BC Managed Care – PPO | Admitting: Endocrinology

## 2014-11-30 VITALS — BP 120/80 | HR 64 | Temp 98.1°F | Ht 63.0 in | Wt 150.0 lb

## 2014-11-30 DIAGNOSIS — E039 Hypothyroidism, unspecified: Secondary | ICD-10-CM | POA: Diagnosis not present

## 2014-11-30 MED ORDER — LEVOTHYROXINE SODIUM 50 MCG PO TABS: 50.0000 ug | ORAL_TABLET | Freq: Every day | ORAL | Status: DC

## 2014-11-30 NOTE — Patient Instructions (Addendum)
i have sent a prescription to your pharmacy, for the thyroid Please recheck the blood test in 1 month. Please come back for a follow-up appointment in 6 months    Hypothyroidism The thyroid is a large gland located in the lower front of your neck. The thyroid gland helps control metabolism. Metabolism is how your body handles food. It controls metabolism with the hormone thyroxine. When this gland is underactive (hypothyroid), it produces too little hormone.  CAUSES These include:   Absence or destruction of thyroid tissue.  Goiter due to iodine deficiency.  Goiter due to medications.  Congenital defects (since birth).  Problems with the pituitary. This causes a lack of TSH (thyroid stimulating hormone). This hormone tells the thyroid to turn out more hormone. SYMPTOMS  Lethargy (feeling as though you have no energy)  Cold intolerance  Weight gain (in spite of normal food intake)  Dry skin  Coarse hair  Menstrual irregularity (if severe, may lead to infertility)  Slowing of thought processes Cardiac problems are also caused by insufficient amounts of thyroid hormone. Hypothyroidism in the newborn is cretinism, and is an extreme form. It is important that this form be treated adequately and immediately or it will lead rapidly to retarded physical and mental development. DIAGNOSIS  To prove hypothyroidism, your caregiver may do blood tests and ultrasound tests. Sometimes the signs are hidden. It may be necessary for your caregiver to watch this illness with blood tests either before or after diagnosis and treatment. TREATMENT  Low levels of thyroid hormone are increased by using synthetic thyroid hormone. This is a safe, effective treatment. It usually takes about four weeks to gain the full effects of the medication. After you have the full effect of the medication, it will generally take another four weeks for problems to leave. Your caregiver may start you on low doses. If you  have had heart problems the dose may be gradually increased. It is generally not an emergency to get rapidly to normal. HOME CARE INSTRUCTIONS   Take your medications as your caregiver suggests. Let your caregiver know of any medications you are taking or start taking. Your caregiver will help you with dosage schedules.  As your condition improves, your dosage needs may increase. It will be necessary to have continuing blood tests as suggested by your caregiver.  Report all suspected medication side effects to your caregiver. SEEK MEDICAL CARE IF: Seek medical care if you develop:  Sweating.  Tremulousness (tremors).  Anxiety.  Rapid weight loss.  Heat intolerance.  Emotional swings.  Diarrhea.  Weakness. SEEK IMMEDIATE MEDICAL CARE IF:  You develop chest pain, an irregular heart beat (palpitations), or a rapid heart beat. MAKE SURE YOU:   Understand these instructions.  Will watch your condition.  Will get help right away if you are not doing well or get worse. Document Released: 06/15/2005 Document Revised: 09/07/2011 Document Reviewed: 02/03/2008 [facility] Patient Information 2015 [place], [place]. This information is not intended to replace advice given to you by your health care provider. Make sure you discuss any questions you have with your health care provider.

## 2014-11-30 NOTE — Progress Notes (Signed)
Subjective:    Patient ID: [name], female    DOB: 24-Feb-1951, 64 y.o.   MRN: [id number]  HPI Pt reports hypothyroidism was dx'ed in early 2016, on a routine blood test.  she has never been on prescribed thyroid hormone therapy.  she has never taken kelp or any other type of non-prescribed thyroid product.  she has never had thyroid imaging.  She has never had thyroid surgery, or XRT to the neck.  He has never been on amiodarone or lithium.  She has moderate dry skin, worst on the legs, and assoc fatigue.   Past Medical History  Diagnosis Date  . Degenerative disc disease   . Arthritis     Past Surgical History  Procedure Laterality Date  . Ectopic pregnancy surgery    . Tonsillectomy    . Tubal ligation    . Carpal tunnel release    . Cataract extraction    . Trigger finger release      History   Social History  . Marital Status: Married    Spouse Name: N/A  . Number of Children: N/A  . Years of Education: N/A   Occupational History  . Not on file.   Social History Main Topics  . Smoking status: Never Smoker   . Smokeless tobacco: Not on file  . Alcohol Use: 0.0 oz/week    0 Standard drinks or equivalent per week     Comment: Rare  . Drug Use: No  . Sexual Activity: Yes    Birth Control/ Protection: Surgical, Post-menopausal     Comment: Tubal lig-1st intercourse 64 yo-Fewer than 5 partners   Other Topics Concern  . Not on file   Social History Narrative    Current Outpatient Prescriptions on File Prior to Visit  Medication Sig Dispense Refill  . Ascorbic Acid (VITAMIN C PO) Take by mouth.    . Calcium Carbonate-Vitamin D (CALCIUM + D PO) Take by mouth.    Marland Kitchen glucosamine-chondroitin 500-400 MG tablet Take 1 tablet by mouth daily.    . Ibuprofen (ADVIL PO) Take by mouth.    . Iron Combinations (IRON COMPLEX PO) Take by mouth.    . Multiple Vitamin (MULTIVITAMIN) tablet Take 1 tablet by mouth daily.     No current facility-administered  medications on file prior to visit.    Allergies  Allergen Reactions  . Sulfa Antibiotics Swelling    Family History  Problem Relation Age of Onset  . Uterine cancer Mother   . Heart attack Mother   . Hypertension Mother   . Kidney cancer Father   . Lung cancer Father   . Diabetes Sister   . Thyroid disease Neg Hx     BP 120/80 mmHg  Pulse 64  Temp(Src) 98.1 F (36.7 C) (Oral)  Ht 5\' 3"  (1.6 m)  Wt 150 lb (68.04 kg)  BMI 26.58 kg/m2  SpO2 97%   Review of Systems denies depression, hair loss, sob, weight gain, constipation, numbness, blurry vision, cold intolerance, myalgias, rhinorrhea, easy bruising, and syncope.  She has leg cramps.       Objective:   Physical Exam VS: see vs page GEN: no distress HEAD: head: no deformity eyes: no periorbital swelling, no proptosis external nose and ears are normal mouth: no lesion seen NECK: supple, thyroid is slightly enlarged, with an irregular surface, but no nodule is palpable.  CHEST WALL: no deformity LUNGS:  Clear to auscultation CV: reg rate and rhythm, no murmur ABD: abdomen  is soft, nontender.  no hepatosplenomegaly.  not distended.  no hernia MUSCULOSKELETAL: muscle bulk and strength are grossly normal.  no obvious joint swelling.  gait is normal and steady.   EXTEMITIES: no deformity.  no ulcer on the feet.  feet are of normal color and temp.  no edema PULSES: dorsalis pedis intact bilat.  no carotid bruit.   NEURO:  cn 2-12 grossly intact.   readily moves all 4's.  sensation is intact to touch on the feet SKIN:  Normal texture and temperature.  No rash or suspicious lesion is visible.   NODES:  None palpable at the neck.   PSYCH: alert, well-oriented.  Does not appear anxious nor depressed.     Lab Results  Component Value Date   TSH 5.026* 11/12/2014   T4TOTAL 4.9 11/12/2014      Assessment & Plan:  Hypothyroidism, mild, new.  Patient is advised the following: Patient Instructions  i have sent a  prescription to your pharmacy, for the thyroid Please recheck the blood test in 1 month. Please come back for a follow-up appointment in 6 months    Hypothyroidism The thyroid is a large gland located in the lower front of your neck. The thyroid gland helps control metabolism. Metabolism is how your body handles food. It controls metabolism with the hormone thyroxine. When this gland is underactive (hypothyroid), it produces too little hormone.  CAUSES These include:   Absence or destruction of thyroid tissue.  Goiter due to iodine deficiency.  Goiter due to medications.  Congenital defects (since birth).  Problems with the pituitary. This causes a lack of TSH (thyroid stimulating hormone). This hormone tells the thyroid to turn out more hormone. SYMPTOMS  Lethargy (feeling as though you have no energy)  Cold intolerance  Weight gain (in spite of normal food intake)  Dry skin  Coarse hair  Menstrual irregularity (if severe, may lead to infertility)  Slowing of thought processes Cardiac problems are also caused by insufficient amounts of thyroid hormone. Hypothyroidism in the newborn is cretinism, and is an extreme form. It is important that this form be treated adequately and immediately or it will lead rapidly to retarded physical and mental development. DIAGNOSIS  To prove hypothyroidism, your caregiver may do blood tests and ultrasound tests. Sometimes the signs are hidden. It may be necessary for your caregiver to watch this illness with blood tests either before or after diagnosis and treatment. TREATMENT  Low levels of thyroid hormone are increased by using synthetic thyroid hormone. This is a safe, effective treatment. It usually takes about four weeks to gain the full effects of the medication. After you have the full effect of the medication, it will generally take another four weeks for problems to leave. Your caregiver may start you on low doses. If you have had  heart problems the dose may be gradually increased. It is generally not an emergency to get rapidly to normal. HOME CARE INSTRUCTIONS   Take your medications as your caregiver suggests. Let your caregiver know of any medications you are taking or start taking. Your caregiver will help you with dosage schedules.  As your condition improves, your dosage needs may increase. It will be necessary to have continuing blood tests as suggested by your caregiver.  Report all suspected medication side effects to your caregiver. SEEK MEDICAL CARE IF: Seek medical care if you develop:  Sweating.  Tremulousness (tremors).  Anxiety.  Rapid weight loss.  Heat intolerance.  Emotional swings.  Diarrhea.  Weakness. SEEK IMMEDIATE MEDICAL CARE IF:  You develop chest pain, an irregular heart beat (palpitations), or a rapid heart beat. MAKE SURE YOU:   Understand these instructions.  Will watch your condition.  Will get help right away if you are not doing well or get worse. Document Released: 06/15/2005 Document Revised: 09/07/2011 Document Reviewed: 02/03/2008 [facility] Patient Information 2015 [place], [place]. This information is not intended to replace advice given to you by your health care provider. Make sure you discuss any questions you have with your health care provider.

## 2014-12-06 ENCOUNTER — Other Ambulatory Visit: Payer: Self-pay | Admitting: Orthopaedic Surgery

## 2014-12-06 DIAGNOSIS — M25561 Pain in right knee: Secondary | ICD-10-CM

## 2014-12-10 ENCOUNTER — Ambulatory Visit
Admission: RE | Admit: 2014-12-10 | Discharge: 2014-12-10 | Disposition: A | Discharge status: Home / Self Care | Payer: BC Managed Care – PPO | Source: Ambulatory Visit | Attending: Orthopaedic Surgery | Admitting: Orthopaedic Surgery

## 2014-12-10 DIAGNOSIS — M25561 Pain in right knee: Secondary | ICD-10-CM

## 2015-01-03 ENCOUNTER — Other Ambulatory Visit (INDEPENDENT_AMBULATORY_CARE_PROVIDER_SITE_OTHER): Payer: BC Managed Care – PPO

## 2015-01-03 DIAGNOSIS — E039 Hypothyroidism, unspecified: Secondary | ICD-10-CM | POA: Diagnosis not present

## 2015-01-03 LAB — TSH: TSH: 1.72 u[IU]/mL (ref 0.35–4.50)

## 2015-01-11 ENCOUNTER — Ambulatory Visit: Payer: BC Managed Care – PPO | Admitting: Internal Medicine

## 2015-01-14 ENCOUNTER — Ambulatory Visit: Payer: BC Managed Care – PPO | Admitting: Internal Medicine

## 2015-03-14 ENCOUNTER — Ambulatory Visit: Payer: BC Managed Care – PPO | Admitting: Internal Medicine

## 2015-04-12 ENCOUNTER — Encounter: Payer: Self-pay | Admitting: Internal Medicine

## 2015-04-12 ENCOUNTER — Ambulatory Visit (INDEPENDENT_AMBULATORY_CARE_PROVIDER_SITE_OTHER): Payer: BC Managed Care – PPO | Admitting: Internal Medicine

## 2015-04-12 VITALS — BP 102/60 | HR 65 | Temp 97.8°F | Resp 12 | Ht 64.0 in | Wt 157.8 lb

## 2015-04-12 DIAGNOSIS — E039 Hypothyroidism, unspecified: Secondary | ICD-10-CM

## 2015-04-12 NOTE — Patient Instructions (Signed)
Please come back for labs.  Continue Levothyroxine 50 mcg daily.  Take the thyroid hormone every day, with water, >30 minutes before breakfast, separated by >4 hours from acid reflux medications, calcium, iron, multivitamins.  Please come back for a follow-up appointment in 6 months.

## 2015-04-12 NOTE — Progress Notes (Signed)
Patient ID: [name], female   DOB: [birth year], 64 y.o.   MRN: [id number]   HPI  [name] is a 64 y.o.-year-old female, referred by Dr Phineas Real for management of hypothyroidism. She saw Dr Loanne Drilling before, last visit with him 4 mo ago.  Pt. has been dx with hypothyroidism in 10/2014; started on Levothyroxine 50 mcg, taken: - fasting - with water - separated by >30 min from b'fast  - + calcium during the day (>10 am) - No PPIs - + multivitamins  - + iron at night  I reviewed pt's thyroid tests: Lab Results  Component Value Date   TSH 1.72 01/03/2015   TSH 5.026* 11/12/2014   TSH 4.637* 11/07/2014   TSH 4.208 11/01/2013   TSH 4.165 10/28/2012   TSH 5.623* 10/26/2012    Pt complains of: - + fatigue - + dry skin and itching - + Poor sleep - + weight gain - no cold intolerance - no depression - no constipation - no hair loss  Pt denies feeling nodules in neck, hoarseness, dysphagia/odynophagia, SOB with lying down.  She has no FH of thyroid disorders. No FH of thyroid cancer.  No h/o radiation tx to head or neck. No recent use of iodine supplements.  I reviewed her chart and she also has a history of carpal tunnel sx'es: 2003 and 2010.  Sister has DM1.   She sees Dr Estanislado Pandy (rheum). She was investigated for possible autoimmune disease in the past, but she does not have a clear diagnosis.  She exercises: Walking and tennis 5-7 times a week.  ROS: Constitutional: See history of present illness Eyes: no blurry vision, no xerophthalmia ENT: no sore throat, no nodules palpated in throat, no dysphagia/odynophagia, no hoarseness Cardiovascular: no CP/SOB/palpitations/leg swelling Respiratory: no cough/SOB Gastrointestinal: no N/V/D/C Musculoskeletal: + muscle/+ joint aches Skin: no rashes, + easy bruising, + itching-dry skin Neurological: no tremors/numbness/tingling/dizziness Psychiatric: no depression/anxiety  Past Medical History  Diagnosis  Date  . Degenerative disc disease   . Arthritis    Past Surgical History  Procedure Laterality Date  . Ectopic pregnancy surgery    . Tonsillectomy    . Tubal ligation    . Carpal tunnel release    . Cataract extraction    . Trigger finger release     Social History   Social History  . Marital Status: Married    Spouse Name: N/A  . Number of Children: 2   Occupational History  .  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 Smoking status: Never Smoker   . Smokeless tobacco: Not on file  . Alcohol Use: 0.0 oz/week    0 Standard drinks or equivalent per week     Comment: Rare  . Drug Use: No   Current Outpatient Prescriptions on File Prior to Visit  Medication Sig Dispense Refill  . Ascorbic Acid (VITAMIN C PO) Take by mouth.    . Calcium Carbonate-Vitamin D (CALCIUM + D PO) Take by mouth.    Marland Kitchen glucosamine-chondroitin 500-400 MG tablet Take 1 tablet by mouth daily.    . Ibuprofen (ADVIL PO) Take by mouth.    . Iron Combinations (IRON COMPLEX PO) Take by mouth.    . levothyroxine (SYNTHROID, LEVOTHROID) 50 MCG tablet Take 1 tablet (50 mcg total) by mouth daily. 90 tablet 3  . Multiple Vitamin (MULTIVITAMIN) tablet Take 1 tablet by mouth daily.     No current facility-administered medications on  file prior to visit.   Allergies  Allergen Reactions  . Sulfa Antibiotics Swelling   Family History  Problem Relation Age of Onset  . Uterine cancer Mother   . Heart attack Mother   . Hypertension Mother   . Kidney cancer Father   . Lung cancer Father   . Diabetes Sister   . Thyroid disease Neg Hx    PE: BP 102/60 mmHg  Pulse 65  Temp(Src) 97.8 F (36.6 C) (Oral)  Resp 12  Ht 5\' 4"  (1.626 m)  Wt 157 lb 12.8 oz (71.578 kg)  BMI 27.07 kg/m2  SpO2 95% Wt Readings from Last 3 Encounters:  04/12/15 157 lb 12.8 oz (71.578 kg)  12/10/14 140 lb (63.504 kg)  11/30/14 150 lb (68.04 kg)   Constitutional: Slightly overweight, in  NAD Eyes: PERRLA, EOMI, no exophthalmos ENT: moist mucous membranes, no thyromegaly, no cervical lymphadenopathy Cardiovascular: RRR, No MRG Respiratory: CTA B Gastrointestinal: abdomen soft, NT, ND, BS+ Musculoskeletal: no deformities, strength intact in all 4 Skin: moist, warm, no rashes Neurological: no tremor with outstretched hands, DTR normal in all 4  ASSESSMENT: 1. Hypothyroidism  PLAN:  1. Patient with recent dx of hypothyroidism, on levothyroxine therapy. She appears euthyroid. She does have some symptoms that are possibly related to hypothyroidism: Fatigue, dry skin, weight gain. She does not appear to have a goiter, thyroid nodules, or neck compression symptoms. - We discussed about possible etiologies of hypothyroidism, the most common in [place] being Hashimoto's thyroiditis. I explained that this is an autoimmune disorder in which the body secretes antibodies against the thyroid. The thyroid is gradually becoming inactive and she may need increased doses of levothyroxine over time. I explained that there is no specific treatment for this other than levothyroxine in case her thyroid tests are abnormal. - We discussed about correct intake of levothyroxine, fasting, with water, separated by at least 30 minutes from breakfast, and separated by more than 4 hours from calcium, iron, multivitamins, acid reflux medications (PPIs). She is taking it correctly. - will check thyroid tests today: TSH, free T4, free T3, and will also add thyroid antibodies: TPO and ATA. - If these are abnormal, she will need to return in 6-8 weeks for repeat labs - If these are normal, I will see her back in 6 months  Component     Latest Ref Rng 04/15/2015  TSH     0.35 - 4.50 uIU/mL 1.14  Free T4     0.60 - 1.60 ng/dL 1.05  T3, Free     2.3 - 4.2 pg/mL 3.2  Thyroperoxidase Ab SerPl-aCnc     <9 IU/mL 151 (H)  Thyroglobulin Ab     <2 IU/mL <1   New dx of hashimoto's thyroiditis, based on elevated  thyroid Ab's.  Thyroid tests are normal, therefore will continue the same dose of levothyroxine, 50 g daily.

## 2015-04-15 ENCOUNTER — Other Ambulatory Visit (INDEPENDENT_AMBULATORY_CARE_PROVIDER_SITE_OTHER): Payer: BC Managed Care – PPO

## 2015-04-15 DIAGNOSIS — E039 Hypothyroidism, unspecified: Secondary | ICD-10-CM | POA: Diagnosis not present

## 2015-04-15 LAB — TSH: TSH: 1.14 u[IU]/mL (ref 0.35–4.50)

## 2015-04-15 LAB — T3, FREE: T3 FREE: 3.2 pg/mL (ref 2.3–4.2)

## 2015-04-16 ENCOUNTER — Telehealth: Payer: Self-pay | Admitting: Internal Medicine

## 2015-04-16 LAB — THYROID PEROXIDASE ANTIBODY: Thyroperoxidase Ab SerPl-aCnc: 151 IU/mL — ABNORMAL HIGH (ref <9)

## 2015-04-16 LAB — T4, FREE: FREE T4: 1.05 ng/dL (ref 0.60–1.60)

## 2015-04-16 LAB — THYROGLOBULIN ANTIBODY

## 2015-04-16 NOTE — Telephone Encounter (Signed)
Called pt back and lvm advising her per Dr Arman Filter result note. Advised pt to call back with any further questions.

## 2015-04-16 NOTE — Telephone Encounter (Signed)
Pt returning call for labs please.  

## 2015-08-27 ENCOUNTER — Other Ambulatory Visit: Payer: Self-pay | Admitting: Endocrinology

## 2015-08-27 NOTE — Telephone Encounter (Signed)
Larene Beach, This is Dr. Arman Filter pt.  Thanks!

## 2015-10-15 ENCOUNTER — Encounter: Payer: Self-pay | Admitting: Internal Medicine

## 2015-10-15 ENCOUNTER — Ambulatory Visit (INDEPENDENT_AMBULATORY_CARE_PROVIDER_SITE_OTHER): Payer: BC Managed Care – PPO | Admitting: Internal Medicine

## 2015-10-15 VITALS — BP 102/64 | HR 54 | Temp 98.2°F | Resp 12 | Wt 142.0 lb

## 2015-10-15 DIAGNOSIS — E063 Autoimmune thyroiditis: Secondary | ICD-10-CM

## 2015-10-15 DIAGNOSIS — E038 Other specified hypothyroidism: Secondary | ICD-10-CM | POA: Insufficient documentation

## 2015-10-15 NOTE — Progress Notes (Signed)
Patient ID: [name], female   DOB: 1951/06/01, 65 y.o.   MRN: [id number]   HPI  [name] is a 65 y.o.-year-old female, initially referred by Dr Phineas Real for management of hypothyroidism, diagnosed as secondary to Hashimoto's thyroiditis at last visit. Last visit with me 6 months ago.  Pt. has been dx with hypothyroidism in 10/2014; started on Levothyroxine 50 mcg, which she continues today. She takes this - fasting - separated by >30 min from b'fast  - + calcium at lunch and another tab at 4 pm - No PPIs - + multivitamins at night - + iron at night She takes Biotin in am.  I reviewed pt's thyroid tests: Lab Results  Component Value Date   TSH 1.14 04/15/2015   TSH 1.72 01/03/2015   TSH 5.026* 11/12/2014   TSH 4.637* 11/07/2014   TSH 4.208 11/01/2013   TSH 4.165 10/28/2012   TSH 5.623* 10/26/2012   FREET4 1.05 04/15/2015    At last visit, her TPO antibodies were elevated, confirming Hashimoto thyroiditis: Component     Latest Ref Rng 04/15/2015  Thyroperoxidase Ab SerPl-aCnc     <9 IU/mL 151 (H)  Thyroglobulin Ab     <2 IU/mL <1   Pt complains of: - no fatigue - + dry skin and itching (improved) - no Poor sleep - + weight loss (intentional) - no cold intolerance - no depression - no constipation - no hair loss  Pt denies feeling nodules in neck, hoarseness, dysphagia/odynophagia, SOB with lying down.  She has no FH of thyroid disorders. No FH of thyroid cancer.  No h/o radiation tx to head or neck. No recent use of iodine supplements.  I reviewed her chart and she also has a history of carpal tunnel sx'es: 2003 and 2010.  She sees Dr Estanislado Pandy (rheum). She was investigated for possible autoimmune disease in the past, but she does not have a clear diagnosis.  She exercises: Walking and tennis 5-7 times a week.  ROS: Constitutional: see HPI Eyes: no blurry vision, no xerophthalmia ENT: no sore throat, see HPI Cardiovascular: no  CP/SOB/palpitations/leg swelling Respiratory: no cough/SOB Gastrointestinal: no N/V/D/C Musculoskeletal: no muscle/joint aches Skin: no rashes Neurological: no tremors/numbness/tingling/dizziness  I reviewed pt's medications, allergies, PMH, social hx, family hx, and changes were documented in the history of present illness. Otherwise, unchanged from my initial visit note.  Past Medical History  Diagnosis Date  . Degenerative disc disease   . Arthritis    Past Surgical History  Procedure Laterality Date  . Ectopic pregnancy surgery    . Tonsillectomy    . Tubal ligation    . Carpal tunnel release    . Cataract extraction    . Trigger finger release     Social History   Social History  . Marital Status: Married    Spouse Name: [name]  . Number of Children: 2   Occupational History  .  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 Smoking status: Never Smoker   . Smokeless tobacco: Not on file  . Alcohol Use: 0.0 oz/week    0 Standard drinks or equivalent per week     Comment: Rare  . Drug Use: No   Current Outpatient Prescriptions on File Prior to Visit  Medication Sig Dispense Refill  . Ascorbic Acid (VITAMIN C PO) Take by mouth.    . Calcium Carbonate-Vitamin D (CALCIUM + D PO) Take by mouth.    Marland Kitchen  glucosamine-chondroitin 500-400 MG tablet Take 1 tablet by mouth daily.    . Ibuprofen (ADVIL PO) Take by mouth.    . Iron Combinations (IRON COMPLEX PO) Take by mouth.    . levothyroxine (SYNTHROID, LEVOTHROID) 50 MCG tablet TAKE ONE (1) TABLET EACH DAY 90 tablet 0  . Multiple Vitamin (MULTIVITAMIN) tablet Take 1 tablet by mouth daily.    . Naproxen Sodium (ALEVE) 220 MG CAPS Take 1 capsule by mouth as needed.     No current facility-administered medications on file prior to visit.   Allergies  Allergen Reactions  . Sulfa Antibiotics Swelling   Family History  Problem Relation Age of Onset  . Uterine cancer Mother   . Heart  attack Mother   . Hypertension Mother   . Kidney cancer Father   . Lung cancer Father   . Diabetes Sister   . Thyroid disease Neg Hx    PE: BP 102/64 mmHg  Pulse 54  Temp(Src) 98.2 F (36.8 C) (Oral)  Resp 12  Wt 142 lb (64.411 kg)  SpO2 98% Body mass index is 24.36 kg/(m^2).  Wt Readings from Last 3 Encounters:  10/15/15 142 lb (64.411 kg)  04/12/15 157 lb 12.8 oz (71.578 kg)  12/10/14 140 lb (63.504 kg)   Constitutional: normal weight, in NAD Eyes: PERRLA, EOMI, no exophthalmos ENT: moist mucous membranes, no thyromegaly, no cervical lymphadenopathy Cardiovascular: RRR, No MRG Respiratory: CTA B Gastrointestinal: abdomen soft, NT, ND, BS+ Musculoskeletal: no deformities, strength intact in all 4 Skin: moist, warm, no rashes Neurological: no tremor with outstretched hands, DTR normal in all 4  ASSESSMENT: 1. Hypothyroidism due to Hashimoto's thyroiditis  PLAN:  1. Patient with recent dx of hypothyroidism, on levothyroxine therapy. She appears euthyroid, but continues to have some symptoms that are possibly related to hypothyroidism: Fatigue, dry skin, weight gain.  - She does not appear to have a goiter, thyroid nodules, or neck compression symptoms. - at last visit, we dx'ed Hashimoto's thyroiditis. We discussed that this is an autoimmune disorder in which the body produces antibodies against the thyroid. The thyroid is gradually becoming inactive and she may need increased doses of levothyroxine over time. There is no specific treatment for this other than levothyroxine in case her thyroid tests are abnormal. - We discussed about correct intake of levothyroxine, fasting, with water, separated by at least 30 minutes from breakfast, and separated by more than 4 hours from calcium, iron, multivitamins, acid reflux medications (PPIs). She is taking it correctly. - We did reviewed together her previous TFTs from 03/2015, which were normal, on 50 g levothyroxine daily. We'll  continue this dose for now, pending the results of the thyroid tests. We need to check her TSH and fT4 but cannot check today as she already took her Biotin >> come back in 3-4 days for labs. - Return to clinic in 1 year  Needs refills.  Component     Latest Ref Rng 10/18/2015  TSH     0.35 - 4.50 uIU/mL 1.97  T4,Free(Direct)     0.60 - 1.60 ng/dL 1.01  Excellent labs >> will refill LT4 50 mcg daily.

## 2015-10-15 NOTE — Patient Instructions (Signed)
Please come back for labs after stopping Biotin for 2 days..  Continue Levothyroxine 50 mcg daily.  Take the thyroid hormone every day, with water, >30 minutes before breakfast, separated by >4 hours from acid reflux medications, calcium, iron, multivitamins.  Please come back for a follow-up appointment in 1 year.

## 2015-10-18 ENCOUNTER — Other Ambulatory Visit (INDEPENDENT_AMBULATORY_CARE_PROVIDER_SITE_OTHER): Payer: BC Managed Care – PPO

## 2015-10-18 DIAGNOSIS — E038 Other specified hypothyroidism: Secondary | ICD-10-CM

## 2015-10-18 DIAGNOSIS — E063 Autoimmune thyroiditis: Secondary | ICD-10-CM

## 2015-10-18 LAB — T4, FREE: Free T4: 1.01 ng/dL (ref 0.60–1.60)

## 2015-10-18 LAB — TSH: TSH: 1.97 u[IU]/mL (ref 0.35–4.50)

## 2015-10-18 MED ORDER — LEVOTHYROXINE SODIUM 50 MCG PO TABS: ORAL_TABLET | ORAL | Status: DC

## 2015-10-21 ENCOUNTER — Other Ambulatory Visit: Payer: BC Managed Care – PPO

## 2015-11-27 ENCOUNTER — Telehealth: Payer: Self-pay

## 2015-11-27 NOTE — Telephone Encounter (Signed)
Patient called because she is needing to have hip replacement with Dr. Durward Fortes. She wants to see if you will fill out medical clearance form stating she is healthy enough for major surgery.

## 2015-11-27 NOTE — Telephone Encounter (Signed)
Patient informed that PCP would need to provide this medical clearance.

## 2015-11-27 NOTE — Telephone Encounter (Signed)
I cannot do as a gynecologist. She would need to see a primary physician.

## 2015-11-28 ENCOUNTER — Ambulatory Visit (INDEPENDENT_AMBULATORY_CARE_PROVIDER_SITE_OTHER): Payer: BC Managed Care – PPO | Admitting: Family Medicine

## 2015-11-28 VITALS — BP 110/64 | HR 75 | Temp 98.2°F | Resp 18 | Ht 64.0 in | Wt 134.0 lb

## 2015-11-28 DIAGNOSIS — Z01818 Encounter for other preprocedural examination: Secondary | ICD-10-CM

## 2015-11-28 LAB — COMPLETE METABOLIC PANEL WITH GFR
ALT: 11 U/L (ref 6–29)
AST: 17 U/L (ref 10–35)
Albumin: 3.8 g/dL (ref 3.6–5.1)
Alkaline Phosphatase: 85 U/L (ref 33–130)
BUN: 20 mg/dL (ref 7–25)
CO2: 28 mmol/L (ref 20–31)
Calcium: 8.6 mg/dL (ref 8.6–10.4)
Chloride: 104 mmol/L (ref 98–110)
Creat: 0.78 mg/dL (ref 0.50–0.99)
GFR, Est African American: >89 mL/min (ref >=60)
GFR, Est Non African American: 81 mL/min (ref >=60)
GLUCOSE: 93 mg/dL (ref 65–99)
Potassium: 4.5 mmol/L (ref 3.5–5.3)
Sodium: 140 mmol/L (ref 135–146)
TOTAL PROTEIN: 6.2 g/dL (ref 6.1–8.1)
Total Bilirubin: 0.4 mg/dL (ref 0.2–1.2)

## 2015-11-28 LAB — POCT URINALYSIS DIP (MANUAL ENTRY)
BILIRUBIN UA: NEGATIVE
Blood, UA: NEGATIVE
Glucose, UA: NEGATIVE
Ketones, POC UA: NEGATIVE
LEUKOCYTES UA: NEGATIVE
NITRITE UA: NEGATIVE
PH UA: 6.5
PROTEIN UA: NEGATIVE
Spec Grav, UA: 1.015
Urobilinogen, UA: 0.2

## 2015-11-28 LAB — CBC
HCT: 36.4 % (ref 35.0–45.0)
Hemoglobin: 12.1 g/dL (ref 11.7–15.5)
MCH: 30.5 pg (ref 27.0–33.0)
MCHC: 33.2 g/dL (ref 32.0–36.0)
MCV: 91.7 fL (ref 80.0–100.0)
MPV: 10 fL (ref 7.5–12.5)
Platelets: 275 10*3/uL (ref 140–400)
RBC: 3.97 MIL/uL (ref 3.80–5.10)
RDW: 13.1 % (ref 11.0–15.0)
WBC: 6.5 10*3/uL (ref 3.8–10.8)

## 2015-11-28 NOTE — Progress Notes (Addendum)
Subjective:    Patient ID: [name], female    DOB: 01-06-1951, 65 y.o.   MRN: [id number]  HPI This is a pleasant 65 yo female who presents today for surgical clearance for left hip replacement. She does not have regular PCP, sees endocrinologist for hypothyroidism and gyn for annual well woman exam. Has an appointment with Dr. Phineas Real next week. Up to date on mammogram and colonoscopy. She has lost 25 pounds with weight watchers. She plays tennis regularly.   Past Medical History  Diagnosis Date  . Degenerative disc disease   . Arthritis   . Cataract    Past Surgical History  Procedure Laterality Date  . Ectopic pregnancy surgery    . Tonsillectomy    . Tubal ligation    . Carpal tunnel release    . Cataract extraction    . Trigger finger release    . Eye surgery     Family History  Problem Relation Age of Onset  . Uterine cancer Mother   . Heart attack Mother   . Hypertension Mother   . Kidney cancer Father   . Lung cancer Father   . Diabetes Sister   . Thyroid disease Neg Hx    Social History  Substance Use Topics  . Smoking status: Never Smoker   . Smokeless tobacco: None  . Alcohol Use: 0.0 oz/week    0 Standard drinks or equivalent per week     Comment: Rare    Review of Systems  Constitutional: Negative for fever and fatigue.  Respiratory: Negative for cough, chest tightness and shortness of breath.   Cardiovascular: Negative for chest pain, palpitations and leg swelling.  Gastrointestinal: Negative for nausea, vomiting, diarrhea, constipation, blood in stool and abdominal distention.  Genitourinary: Negative for dysuria, frequency and hematuria.  Musculoskeletal:       Left hip pain  Neurological: Negative for dizziness, light-headedness and headaches.  Hematological: Negative for adenopathy. Does not bruise/bleed easily.       Objective:   Physical Exam  Constitutional: She is oriented to person, place, and time. She appears  well-developed and well-nourished. No distress.  HENT:  Head: Normocephalic and atraumatic.  Right Ear: External ear normal.  Left Ear: External ear normal.  Nose: Nose normal.  Mouth/Throat: Oropharynx is clear and moist.  Eyes: Conjunctivae are normal.  Neck: Normal range of motion. Neck supple. No thyromegaly present.  Cardiovascular: Normal rate, regular rhythm and normal heart sounds.   Pulmonary/Chest: Effort normal and breath sounds normal.  Musculoskeletal: Normal range of motion.  Lymphadenopathy:    She has no cervical adenopathy.  Neurological: She is oriented to person, place, and time.  Skin: Skin is warm and dry. She is not diaphoretic.  Psychiatric: She has a normal mood and affect. Her behavior is normal. Judgment and thought content normal.  Vitals reviewed.  BP 110/64 mmHg  Pulse 75  Temp(Src) 98.2 F (36.8 C) (Oral)  Resp 18  Ht 5\' 4"  (1.626 m)  Wt 134 lb (60.782 kg)  BMI 22.99 kg/m2  SpO2 99% EKG- Normal Sinus Rhythm, reviewed with Dr. Tamala Julian    Assessment & Plan:  1. Pre-operative clearance - EKG 12-Lead - POCT urinalysis dipstick - CBC - COMPLETE METABOLIC PANEL WITH GFR - I have her form and will complete once labs are resulted  Clarene Reamer, FNP-BC  [facility]  11/28/2015 3:06 PM 12/02/15 10:20 am- Patient's form completed for surgical clearance and placed  in box to be faxed.

## 2015-11-28 NOTE — Patient Instructions (Addendum)
If there are any concerning abnormalities with your labs, I will call you, Otherwise, I will send your results in letter.   Good luck with your surgery!    IF you received an x-ray today, you will receive an invoice from [facility]. Please contact [facility] at [phone] with questions or concerns regarding your invoice.   IF you received labwork today, you will receive an invoice from [employer]. Please contact [facility] at [phone] with questions or concerns regarding your invoice.   Our billing staff will not be able to assist you with questions regarding bills from these companies.  You will be contacted with the lab results as soon as they are available. The fastest way to get your results is to activate your My Chart account. Instructions are located on the last page of this paperwork. If you have not heard from [place] regarding the results in 2 weeks, please contact this office.    \

## 2015-12-01 ENCOUNTER — Encounter: Payer: Self-pay | Admitting: Family Medicine

## 2015-12-04 ENCOUNTER — Telehealth: Payer: Self-pay | Admitting: Family Medicine

## 2015-12-04 NOTE — Telephone Encounter (Signed)
[name],  Pt is calling to make sure that her form has been completed and faxed to the surgeon so she can schedule her surgery.  Please advise if this has been completed.  thanks

## 2015-12-04 NOTE — Telephone Encounter (Signed)
Called patient, she had called surgeon's office and was told her form was received.

## 2015-12-09 ENCOUNTER — Encounter: Payer: Self-pay | Admitting: Gynecology

## 2015-12-09 ENCOUNTER — Ambulatory Visit (INDEPENDENT_AMBULATORY_CARE_PROVIDER_SITE_OTHER): Payer: BC Managed Care – PPO | Admitting: Gynecology

## 2015-12-09 ENCOUNTER — Encounter: Payer: BC Managed Care – PPO | Admitting: Gynecology

## 2015-12-09 VITALS — BP 116/74 | Ht 64.0 in | Wt 131.0 lb

## 2015-12-09 DIAGNOSIS — Z01419 Encounter for gynecological examination (general) (routine) without abnormal findings: Secondary | ICD-10-CM | POA: Diagnosis not present

## 2015-12-09 DIAGNOSIS — N952 Postmenopausal atrophic vaginitis: Secondary | ICD-10-CM | POA: Diagnosis not present

## 2015-12-09 NOTE — Patient Instructions (Signed)

## 2015-12-09 NOTE — Progress Notes (Signed)
    AERIN LAVANWAY 1951/02/16 [id number]        65 y.o.  [id number]  for annual exam.  Doing well.  Past medical history,surgical history, problem list, medications, allergies, family history and social history were all reviewed and documented as reviewed in the EPIC chart.  ROS:  Performed with pertinent positives and negatives included in the history, assessment and plan.   Additional significant findings :  None   Exam: Caryn Bee assistant Filed Vitals:   12/09/15 0939  BP: 116/74  Height: 5\' 4"  (1.626 m)  Weight: 131 lb (59.421 kg)   General appearance:  Normal affect, orientation and appearance. Skin: Grossly normal HEENT: Without gross lesions.  No cervical or supraclavicular adenopathy. Thyroid normal.  Lungs:  Clear without wheezing, rales or rhonchi Cardiac: RR, without RMG Abdominal:  Soft, nontender, without masses, guarding, rebound, organomegaly or hernia Breasts:  Examined lying and sitting without masses, retractions, discharge or axillary adenopathy. Pelvic:  Ext/BUS/vagina With atrophic changes  Cervix with atrophic changes  Uterus anteverted, normal size, shape and contour, midline and mobile nontender   Adnexa without masses or tenderness    Anus and perineum normal   Rectovaginal normal sphincter tone without palpated masses or tenderness.    Assessment/Plan:  65 y.o. [id number] female for annual exam.   1. Postmenopausal. Without significant hot flashes, night sweats, vaginal dryness or any vaginal bleeding. Continue to monitor and report any issues or vaginal bleeding. 2. Mammogram overdue. I reminded her to schedule this and she agrees to do so. SBE monthly reviewed. 3. Pap smear 2016. No Pap smear done today. No history of significant abnormal Pap smears previously. Plan repeat Pap smear at 3 year interval per current screening guidelines. 4. Colonoscopy 2010 with reported repeat interval 10 years. 5. DEXA 2013 normal. Plan repeat at age  84. 6. Health maintenance. Recent lab work to include CBC, CMP, TSH and urinalysis negative. Lipid profile last year normal and she declines repeat this year. Follow up 1 year, sooner as needed.   Anastasio Auerbach MD, 9:58 AM 12/09/2015

## 2015-12-12 ENCOUNTER — Encounter (HOSPITAL_COMMUNITY): Payer: Self-pay

## 2015-12-12 ENCOUNTER — Encounter (HOSPITAL_COMMUNITY)
Admission: RE | Admit: 2015-12-12 | Discharge: 2015-12-12 | Disposition: A | Discharge status: Home / Self Care | Payer: BC Managed Care – PPO | Source: Ambulatory Visit | Attending: Orthopaedic Surgery | Admitting: Orthopaedic Surgery

## 2015-12-12 ENCOUNTER — Encounter (HOSPITAL_COMMUNITY)
Admission: RE | Admit: 2015-12-12 | Discharge: 2015-12-12 | Disposition: A | Discharge status: Home / Self Care | Payer: BC Managed Care – PPO | Source: Ambulatory Visit | Attending: Orthopedic Surgery | Admitting: Orthopedic Surgery

## 2015-12-12 DIAGNOSIS — Z0183 Encounter for blood typing: Secondary | ICD-10-CM | POA: Diagnosis not present

## 2015-12-12 DIAGNOSIS — M1612 Unilateral primary osteoarthritis, left hip: Secondary | ICD-10-CM | POA: Diagnosis not present

## 2015-12-12 DIAGNOSIS — Z01812 Encounter for preprocedural laboratory examination: Secondary | ICD-10-CM | POA: Diagnosis not present

## 2015-12-12 DIAGNOSIS — Z01818 Encounter for other preprocedural examination: Secondary | ICD-10-CM | POA: Insufficient documentation

## 2015-12-12 HISTORY — DX: Adverse effect of unspecified anesthetic, initial encounter: T41.45XA

## 2015-12-12 HISTORY — DX: Other complications of anesthesia, initial encounter: T88.59XA

## 2015-12-12 HISTORY — DX: Hypothyroidism, unspecified: E03.9

## 2015-12-12 HISTORY — DX: Nausea with vomiting, unspecified: R11.2

## 2015-12-12 HISTORY — DX: Other specified postprocedural states: Z98.890

## 2015-12-12 LAB — TYPE AND SCREEN
ABO/RH(D): O POS
ANTIBODY SCREEN: NEGATIVE

## 2015-12-12 LAB — URINALYSIS, ROUTINE W REFLEX MICROSCOPIC
Bilirubin Urine: NEGATIVE
GLUCOSE, UA: NEGATIVE mg/dL
HGB URINE DIPSTICK: NEGATIVE
KETONES UR: NEGATIVE mg/dL
Leukocytes, UA: NEGATIVE
Nitrite: NEGATIVE
PH: 5 (ref 5.0–8.0)
PROTEIN: NEGATIVE mg/dL
Specific Gravity, Urine: 1.026 (ref 1.005–1.030)

## 2015-12-12 LAB — COMPREHENSIVE METABOLIC PANEL
ALT: 16 U/L (ref 14–54)
ANION GAP: 8 (ref 5–15)
AST: 27 U/L (ref 15–41)
Albumin: 4.2 g/dL (ref 3.5–5.0)
Alkaline Phosphatase: 94 U/L (ref 38–126)
BUN: 21 mg/dL — ABNORMAL HIGH (ref 6–20)
CHLORIDE: 100 mmol/L — AB (ref 101–111)
CO2: 31 mmol/L (ref 22–32)
Calcium: 9.6 mg/dL (ref 8.9–10.3)
Creatinine, Ser: 0.84 mg/dL (ref 0.44–1.00)
Glucose, Bld: 88 mg/dL (ref 65–99)
POTASSIUM: 3.8 mmol/L (ref 3.5–5.1)
SODIUM: 139 mmol/L (ref 135–145)
Total Bilirubin: 0.6 mg/dL (ref 0.3–1.2)
Total Protein: 7 g/dL (ref 6.5–8.1)

## 2015-12-12 LAB — CBC WITH DIFFERENTIAL/PLATELET
Basophils Absolute: 0 10*3/uL (ref 0.0–0.1)
Basophils Relative: 1 %
EOS ABS: 0.2 10*3/uL (ref 0.0–0.7)
EOS PCT: 3 %
HCT: 38.9 % (ref 36.0–46.0)
Hemoglobin: 12.6 g/dL (ref 12.0–15.0)
LYMPHS ABS: 1.4 10*3/uL (ref 0.7–4.0)
Lymphocytes Relative: 27 %
MCH: 29.8 pg (ref 26.0–34.0)
MCHC: 32.4 g/dL (ref 30.0–36.0)
MCV: 92 fL (ref 78.0–100.0)
MONO ABS: 0.4 10*3/uL (ref 0.1–1.0)
Monocytes Relative: 8 %
Neutro Abs: 3.2 10*3/uL (ref 1.7–7.7)
Neutrophils Relative %: 61 %
PLATELETS: 246 10*3/uL (ref 150–400)
RBC: 4.23 MIL/uL (ref 3.87–5.11)
RDW: 13 % (ref 11.5–15.5)
WBC: 5.2 10*3/uL (ref 4.0–10.5)

## 2015-12-12 LAB — SURGICAL PCR SCREEN
MRSA, PCR: NEGATIVE
STAPHYLOCOCCUS AUREUS: NEGATIVE

## 2015-12-12 LAB — ABO/RH: ABO/RH(D): O POS

## 2015-12-12 LAB — APTT: aPTT: 29 seconds (ref 24–37)

## 2015-12-12 LAB — PROTIME-INR
INR: 1.04 (ref 0.00–1.49)
PROTHROMBIN TIME: 13.8 s (ref 11.6–15.2)

## 2015-12-12 NOTE — Pre-Procedure Instructions (Signed)
[name]  12/12/2015      [facility] - [place], [place] - [address] [place] [address] Phone: [phone] Fax: [phone]    Your procedure is scheduled on  Thursday  12/19/15  Report to [facility] Admitting at 530 A.M.  Call this number if you have problems the morning of surgery:  [phone]   Remember:  Do not eat food or drink liquids after midnight.  Take these medicines the morning of surgery with A SIP OF WATER   LEVOTHYROXINE     (STOP ASPIRIN , TURMERIC, NAPROXEN/ ALEVE, MULTIVITAMIN, IBUPROFEN/ ADVIL/ MOTRIN, HERBAL MEDICINES ,GOODY POWDERS/ BC'S)   Do not wear jewelry, make-up or nail polish.  Do not wear lotions, powders, or perfumes.  You may wear deoderant.  Do not shave 48 hours prior to surgery.  Men may shave face and neck.  Do not bring valuables to the hospital.  [facility] is not responsible for any belongings or valuables.  Contacts, dentures or bridgework may not be worn into surgery.  Leave your suitcase in the car.  After surgery it may be brought to your room.  For patients admitted to the hospital, discharge time will be determined by your treatment team.  Patients discharged the day of surgery will not be allowed to drive home.   Name and phone number of your driver:    Special instructions:  [name] - Preparing for Surgery  Before surgery, you can play an important role.  Because skin is not sterile, your skin needs to be as free of germs as possible.  You can reduce the number of germs on you skin by washing with CHG (chlorahexidine gluconate) soap before surgery.  CHG is an antiseptic cleaner which kills germs and bonds with the skin to continue killing germs even after washing.  Please DO NOT use if you have an allergy to CHG or antibacterial soaps.  If your skin becomes reddened/irritated stop using the CHG and inform your nurse when you arrive at Short Stay.  Do not shave  (including legs and underarms) for at least 48 hours prior to the first CHG shower.  You may shave your face.  Please follow these instructions carefully:   1.  Shower with CHG Soap the night before surgery and the                                morning of Surgery.  2.  If you choose to wash your hair, wash your hair first as usual with your       normal shampoo.  3.  After you shampoo, rinse your hair and body thoroughly to remove the                      Shampoo.  4.  Use CHG as you would any other liquid soap.  You can apply chg directly       to the skin and wash gently with scrungie or a clean washcloth.  5.  Apply the CHG Soap to your body ONLY FROM THE NECK DOWN.        Do not use on open wounds or open sores.  Avoid contact with your eyes,       ears, mouth and genitals (private parts).  Wash genitals (private parts)       with your normal  soap.  6.  Wash thoroughly, paying special attention to the area where your surgery        will be performed.  7.  Thoroughly rinse your body with warm water from the neck down.  8.  DO NOT shower/wash with your normal soap after using and rinsing off       the CHG Soap.  9.  Pat yourself dry with a clean towel.            10.  Wear clean pajamas.            11.  Place clean sheets on your bed the night of your first shower and do not        sleep with pets.  Day of Surgery  Do not apply any lotions/deoderants the morning of surgery.  Please wear clean clothes to the hospital/surgery center.    Please read over the following fact sheets that you were given. Pain Booklet, Coughing and Deep Breathing, Blood Transfusion Information, MRSA Information and Surgical Site Infection Prevention

## 2015-12-13 NOTE — H&P (Signed)
CHIEF COMPLAINT:  Painful left hip.   HISTORY OF PRESENT ILLNESS:  [place] is a very pleasant 65 year old white female who is seen today for evaluation of her left hip and groin.  We have seen her previously for evaluation and have had corticosteroid injections to the left hip which had initially questionable results, the second injection was not of any benefit.  She has now gotten to the point where she is having nighttime pain, as well as pain with activities of daily  living.  She finds now that she is having quite a difficult time with playing tennis.  She continues to have difficulties with her activities of daily living.  She does not remember any history of injury or trauma, but has been a fairly aggressive tennis player.  Seen today for evaluation.   Past medical history and general health is good.   HOSPITALIZATIONS: 1.  Tonsillectomy 1970s. 2.  Tubal ligation 1978. 3.  Ectopic pregnancy 1983. 4.  Carpal tunnel release of the right hand 2003. 5.  Cataract on the right excision 2004. 6.  Left carpal tunnel release and trigger thumb release 2010. 7.  Left cataract extraction 2015. 8.  Right knee arthroscopic debridement 2016. 9.  Admitted in 1970 for dehydration and reaction to sulfa drugs. 6.  Birth of first child in 35. 38.  Birth of second child 36.   CURRENT MEDICATIONS:    1.  Levothyroxine 50 mcg daily. 2.  Multivitamin daily. 3.  Calcium citrate, magnesium and zinc with vitamin D3 2 times a day. 4.  Iron 65 mg. 5.  Biotin 5,000 mcg daily. 6.  Tumeric and cinnamon 1,000 mg. 7.  Glucosamine with MSM daily.   ALLERGIES:  SULFA.   REVIEW OF SYSTEMS:  Fourteen point review of systems unremarkable except for cataract extractions.  She has had bronchitis in the past.  Also has a history of asthma, but is not having problems with that.  She does have occasional vertigo.   FAMILY HISTORY:    Positive for mother who died at 63 years of age from probably COPD.  She had a  heart attack and hypertension, as well as arthritis and uterine cancer.   Father who died at age 89 from renal carcinoma.  She has no brothers.  She has 1 sister at age 51 who has diabetes.   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  She denies the use of tobacco.  States an occasional beer.   PHYSICAL EXAMINATION:  Height 5 feet 3 inches, weight 134 pounds, BMI was 23.7. Vital signs reveals temperature of 98.2, pulse 68, respirations 12, blood pressure 118/70.   Head is normocephalic. Eyes:  Pupils equal, round and reactive to light and accommodation with extraocular movements intact. Ears, nose and throat were benign. Neck was supple, no bruits were noted. Chest had good expansion. Lungs were essentially clear. Cardiac had a regular rhythm and rate, normal S1, S2, no murmurs, rubs or gallops appreciated. Pulses were 2+ bilateral and symmetric in the lower extremities.   CNS: She is oriented x3 and cranial nerves II-XII were grossly intact. Abdomen is scaphoid, soft, nontender, no mass palpable, normal bowel sounds present. Musculoskeletal:  She has range of motion from near 0 degrees to 100 degrees of forward flexion.  This is very painful for her.  She has decreased internal rotation  and external rotation with hip at 90 degrees of flexion.  She has very good strength.  She is neurovascularly intact distally.   RADIOGRAPHS:  Studies do reveal bone-on-bone OA of the left hip with flattening of the femoral head.   CLINICAL IMPRESSION:   1.  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 2.  History of spondylolisthesis L4 on 5. 3.  Hypothyroidism. 4.  History of asthma. 5.  History of vertigo.   RECOMMENDATIONS:  At this time I have reviewed a form from her medical doctor and the nurse practitioner who felt that she  was a candidate from a medical and cardiac standpoint.  This was [employer] office.   At this time because of her symptoms and radiographic studies, as well as the fact that this is being influentially difficult for her on a daily basis, our plan is to proceed with a left total hip arthroplasty. Procedure risks and benefits were fully explained to her in detail using appropriate models.  All questions were answered in detail.  She would like to proceed with this in the very near future.  Mike Craze [place], [place] [phone]  12/18/2015 4:40 PM

## 2015-12-14 LAB — URINE CULTURE: Special Requests: NORMAL

## 2015-12-16 NOTE — Progress Notes (Signed)
Spoke with Dr. Rudene Anda office to notify of U/A and urine culture stating "multiple species present, suggest recollection". Office to call back regarding whether specimen needs to be recollected prior to surgery on 12/19/15.

## 2015-12-17 NOTE — Progress Notes (Signed)
Per Aaron Edelman at Dr. Rudene Anda office, no need to re-collect urine specimen.

## 2015-12-18 ENCOUNTER — Encounter (HOSPITAL_COMMUNITY): Payer: Self-pay | Admitting: Anesthesiology

## 2015-12-18 MED ORDER — ACETAMINOPHEN 10 MG/ML IV SOLN
1000.0000 mg | Freq: Once | INTRAVENOUS | Status: AC
  Administered 2015-12-19: 1000 mg via INTRAVENOUS
  Filled 2015-12-18: qty 100

## 2015-12-18 MED ORDER — CEFAZOLIN SODIUM-DEXTROSE 2-4 GM/100ML-% IV SOLN
2.0000 g | INTRAVENOUS | Status: AC
  Administered 2015-12-19: 2 g via INTRAVENOUS
  Filled 2015-12-18: qty 100

## 2015-12-18 MED ORDER — SODIUM CHLORIDE 0.9 % IV SOLN: INTRAVENOUS | Status: DC

## 2015-12-18 MED ORDER — TRANEXAMIC ACID 1000 MG/10ML IV SOLN
2000.0000 mg | INTRAVENOUS | Status: AC
  Administered 2015-12-19: 2000 mg via TOPICAL
  Filled 2015-12-18: qty 20

## 2015-12-18 NOTE — Anesthesia Preprocedure Evaluation (Addendum)
Anesthesia Evaluation  Patient identified by MRN, date of birth, ID band Patient awake    Reviewed: Allergy & Precautions, NPO status , Patient's Chart, lab work & pertinent test results  History of Anesthesia Complications (+) PONV and history of anesthetic complications  Airway Mallampati: II  TM Distance: >3 FB Neck ROM: Full    Dental no notable dental hx. (+) Teeth Intact, Caps   Pulmonary neg pulmonary ROS,    Pulmonary exam normal breath sounds clear to auscultation       Cardiovascular negative cardio ROS Normal cardiovascular exam Rhythm:Regular Rate:Normal     Neuro/Psych negative neurological ROS  negative psych ROS   GI/Hepatic negative GI ROS, Neg liver ROS,   Endo/Other  Hypothyroidism Hx/o Hashimoto's thyroiditis   Renal/GU negative Renal ROS     Musculoskeletal  (+) Arthritis , Osteoarthritis,  OA Left hip   Abdominal   Peds  Hematology   Anesthesia Other Findings   Reproductive/Obstetrics negative OB ROS                            Anesthesia Physical Anesthesia Plan  ASA: II  Anesthesia Plan: Spinal   Post-op Pain Management:    Induction:   Airway Management Planned: Natural Airway and Nasal Cannula  Additional Equipment:   Intra-op Plan:   Post-operative Plan:   Informed Consent: I have reviewed the patients History and Physical, chart, labs and discussed the procedure including the risks, benefits and alternatives for the proposed anesthesia with the patient or authorized representative who has indicated his/her understanding and acceptance.   Dental advisory given  Plan Discussed with: Anesthesiologist, CRNA and Surgeon  Anesthesia Plan Comments:        Anesthesia Quick Evaluation

## 2015-12-19 ENCOUNTER — Encounter (HOSPITAL_COMMUNITY)
Admission: RE | Disposition: A | Discharge status: Home Health | Payer: Self-pay | Source: Ambulatory Visit | Attending: Orthopaedic Surgery

## 2015-12-19 ENCOUNTER — Inpatient Hospital Stay (HOSPITAL_COMMUNITY)
Admission: RE | Admit: 2015-12-19 | Discharge: 2015-12-21 | DRG: 470 | Disposition: A | Discharge status: Home Health | Payer: BC Managed Care – PPO | Source: Ambulatory Visit | Attending: Orthopaedic Surgery | Admitting: Orthopaedic Surgery

## 2015-12-19 ENCOUNTER — Inpatient Hospital Stay (HOSPITAL_COMMUNITY): Payer: BC Managed Care – PPO

## 2015-12-19 ENCOUNTER — Inpatient Hospital Stay (HOSPITAL_COMMUNITY): Payer: BC Managed Care – PPO | Admitting: Anesthesiology

## 2015-12-19 ENCOUNTER — Encounter (HOSPITAL_COMMUNITY): Payer: Self-pay | Admitting: *Deleted

## 2015-12-19 DIAGNOSIS — Z9841 Cataract extraction status, right eye: Secondary | ICD-10-CM

## 2015-12-19 DIAGNOSIS — Z79899 Other long term (current) drug therapy: Secondary | ICD-10-CM

## 2015-12-19 DIAGNOSIS — E039 Hypothyroidism, unspecified: Secondary | ICD-10-CM | POA: Diagnosis present

## 2015-12-19 DIAGNOSIS — Z96642 Presence of left artificial hip joint: Secondary | ICD-10-CM

## 2015-12-19 DIAGNOSIS — M1612 Unilateral primary osteoarthritis, left hip: Principal | ICD-10-CM | POA: Diagnosis present

## 2015-12-19 DIAGNOSIS — D62 Acute posthemorrhagic anemia: Secondary | ICD-10-CM | POA: Diagnosis not present

## 2015-12-19 DIAGNOSIS — Z882 Allergy status to sulfonamides status: Secondary | ICD-10-CM

## 2015-12-19 DIAGNOSIS — Z419 Encounter for procedure for purposes other than remedying health state, unspecified: Secondary | ICD-10-CM

## 2015-12-19 DIAGNOSIS — Z9842 Cataract extraction status, left eye: Secondary | ICD-10-CM | POA: Diagnosis not present

## 2015-12-19 DIAGNOSIS — G8918 Other acute postprocedural pain: Secondary | ICD-10-CM

## 2015-12-19 HISTORY — PX: TOTAL HIP ARTHROPLASTY: SHX124

## 2015-12-19 SURGERY — ARTHROPLASTY, HIP, TOTAL,POSTERIOR APPROACH
Anesthesia: Spinal | Site: Hip | Laterality: Left

## 2015-12-19 MED ORDER — PROPOFOL 1000 MG/100ML IV EMUL
INTRAVENOUS | Status: AC
  Filled 2015-12-19: qty 100

## 2015-12-19 MED ORDER — SODIUM CHLORIDE 0.9 % IV SOLN
75.0000 mL/h | INTRAVENOUS | Status: DC
  Administered 2015-12-19 – 2015-12-20 (×2): 75 mL/h via INTRAVENOUS

## 2015-12-19 MED ORDER — PHENYLEPHRINE HCL 10 MG/ML IJ SOLN
INTRAMUSCULAR | Status: DC | PRN
  Administered 2015-12-19 (×2): 40 ug via INTRAVENOUS

## 2015-12-19 MED ORDER — DIPHENHYDRAMINE HCL 50 MG/ML IJ SOLN
INTRAMUSCULAR | Status: DC | PRN
  Administered 2015-12-19: 25 mg via INTRAVENOUS

## 2015-12-19 MED ORDER — BISACODYL 10 MG RE SUPP: 10.0000 mg | Freq: Every day | RECTAL | Status: DC | PRN

## 2015-12-19 MED ORDER — MIDAZOLAM HCL 5 MG/5ML IJ SOLN
INTRAMUSCULAR | Status: DC | PRN
  Administered 2015-12-19 (×2): 1 mg via INTRAVENOUS

## 2015-12-19 MED ORDER — DIPHENHYDRAMINE HCL 12.5 MG/5ML PO ELIX: 12.5000 mg | ORAL_SOLUTION | ORAL | Status: DC | PRN

## 2015-12-19 MED ORDER — ACETAMINOPHEN 650 MG RE SUPP: 650.0000 mg | Freq: Four times a day (QID) | RECTAL | Status: DC | PRN

## 2015-12-19 MED ORDER — PHENOL 1.4 % MT LIQD: 1.0000 | OROMUCOSAL | Status: DC | PRN

## 2015-12-19 MED ORDER — METOCLOPRAMIDE HCL 5 MG/ML IJ SOLN: 5.0000 mg | Freq: Three times a day (TID) | INTRAMUSCULAR | Status: DC | PRN

## 2015-12-19 MED ORDER — BUPIVACAINE-EPINEPHRINE (PF) 0.25% -1:200000 IJ SOLN
INTRAMUSCULAR | Status: AC
  Filled 2015-12-19: qty 30

## 2015-12-19 MED ORDER — PROPOFOL 500 MG/50ML IV EMUL
INTRAVENOUS | Status: AC
  Filled 2015-12-19: qty 50

## 2015-12-19 MED ORDER — ALUM & MAG HYDROXIDE-SIMETH 200-200-20 MG/5ML PO SUSP: 30.0000 mL | ORAL | Status: DC | PRN

## 2015-12-19 MED ORDER — LIDOCAINE HCL (CARDIAC) 20 MG/ML IV SOLN
INTRAVENOUS | Status: DC | PRN
  Administered 2015-12-19: 40 mg via INTRAVENOUS

## 2015-12-19 MED ORDER — KETAMINE HCL 100 MG/ML IJ SOLN
INTRAMUSCULAR | Status: AC
  Filled 2015-12-19: qty 1

## 2015-12-19 MED ORDER — CEFAZOLIN IN D5W 1 GM/50ML IV SOLN
1.0000 g | Freq: Four times a day (QID) | INTRAVENOUS | Status: AC
  Administered 2015-12-19 (×2): 1 g via INTRAVENOUS
  Filled 2015-12-19 (×2): qty 50

## 2015-12-19 MED ORDER — MAGNESIUM HYDROXIDE 400 MG/5ML PO SUSP: 30.0000 mL | Freq: Every day | ORAL | Status: DC | PRN

## 2015-12-19 MED ORDER — CHLORHEXIDINE GLUCONATE 4 % EX LIQD: 60.0000 mL | Freq: Once | CUTANEOUS | Status: DC

## 2015-12-19 MED ORDER — METHOCARBAMOL 1000 MG/10ML IJ SOLN
500.0000 mg | Freq: Four times a day (QID) | INTRAVENOUS | Status: DC | PRN
  Filled 2015-12-19: qty 5

## 2015-12-19 MED ORDER — MENTHOL 3 MG MT LOZG
1.0000 | LOZENGE | OROMUCOSAL | Status: DC | PRN
Start: 2015-12-19 — End: 2015-12-21

## 2015-12-19 MED ORDER — BUPIVACAINE-EPINEPHRINE (PF) 0.25% -1:200000 IJ SOLN
INTRAMUSCULAR | Status: DC | PRN
  Administered 2015-12-19: 25 mL via PERINEURAL

## 2015-12-19 MED ORDER — MEPERIDINE HCL 25 MG/ML IJ SOLN: 6.2500 mg | INTRAMUSCULAR | Status: DC | PRN

## 2015-12-19 MED ORDER — HYDROMORPHONE HCL 1 MG/ML IJ SOLN
INTRAMUSCULAR | Status: AC
  Filled 2015-12-19: qty 1

## 2015-12-19 MED ORDER — METHOCARBAMOL 500 MG PO TABS
500.0000 mg | ORAL_TABLET | Freq: Four times a day (QID) | ORAL | Status: DC | PRN
  Administered 2015-12-20: 500 mg via ORAL
  Filled 2015-12-19: qty 1

## 2015-12-19 MED ORDER — PROPOFOL 500 MG/50ML IV EMUL
INTRAVENOUS | Status: DC | PRN
  Administered 2015-12-19: 40 ug/kg/min via INTRAVENOUS

## 2015-12-19 MED ORDER — LIDOCAINE 2% (20 MG/ML) 5 ML SYRINGE
INTRAMUSCULAR | Status: AC
  Filled 2015-12-19: qty 5

## 2015-12-19 MED ORDER — DOCUSATE SODIUM 100 MG PO CAPS
100.0000 mg | ORAL_CAPSULE | Freq: Two times a day (BID) | ORAL | Status: DC
  Administered 2015-12-19 – 2015-12-21 (×4): 100 mg via ORAL
  Filled 2015-12-19 (×4): qty 1

## 2015-12-19 MED ORDER — FERROUS SULFATE 325 (65 FE) MG PO TABS
325.0000 mg | ORAL_TABLET | Freq: Every day | ORAL | Status: DC
  Administered 2015-12-19 – 2015-12-20 (×2): 325 mg via ORAL
  Filled 2015-12-19 (×4): qty 1

## 2015-12-19 MED ORDER — ACETAMINOPHEN 10 MG/ML IV SOLN
INTRAVENOUS | Status: AC
  Filled 2015-12-19: qty 100

## 2015-12-19 MED ORDER — ONDANSETRON HCL 4 MG/2ML IJ SOLN: 4.0000 mg | Freq: Four times a day (QID) | INTRAMUSCULAR | Status: DC | PRN

## 2015-12-19 MED ORDER — SODIUM CHLORIDE 0.9 % IR SOLN
Status: DC | PRN
  Administered 2015-12-19 (×2): 1000 mL

## 2015-12-19 MED ORDER — MIDAZOLAM HCL 2 MG/2ML IJ SOLN
INTRAMUSCULAR | Status: AC
  Filled 2015-12-19: qty 2

## 2015-12-19 MED ORDER — HYDROMORPHONE HCL 1 MG/ML IJ SOLN
0.5000 mg | INTRAMUSCULAR | Status: DC | PRN
  Administered 2015-12-19: 1 mg via INTRAVENOUS
  Administered 2015-12-19: 0.5 mg via INTRAVENOUS
  Administered 2015-12-20: 1 mg via INTRAVENOUS
  Filled 2015-12-19 (×3): qty 1

## 2015-12-19 MED ORDER — ONDANSETRON HCL 4 MG/2ML IJ SOLN
INTRAMUSCULAR | Status: DC | PRN
  Administered 2015-12-19: 4 mg via INTRAVENOUS

## 2015-12-19 MED ORDER — MAGNESIUM CITRATE PO SOLN: 1.0000 | Freq: Once | ORAL | Status: DC | PRN

## 2015-12-19 MED ORDER — LACTATED RINGERS IV SOLN
INTRAVENOUS | Status: DC | PRN
  Administered 2015-12-19 (×2): via INTRAVENOUS

## 2015-12-19 MED ORDER — BUPIVACAINE IN DEXTROSE 0.75-8.25 % IT SOLN
INTRATHECAL | Status: DC | PRN
  Administered 2015-12-19: 1.7 mL via INTRATHECAL

## 2015-12-19 MED ORDER — FENTANYL CITRATE (PF) 250 MCG/5ML IJ SOLN
INTRAMUSCULAR | Status: AC
  Filled 2015-12-19: qty 5

## 2015-12-19 MED ORDER — HYDROMORPHONE HCL 1 MG/ML IJ SOLN
0.2500 mg | INTRAMUSCULAR | Status: DC | PRN
  Administered 2015-12-19: 0.25 mg via INTRAVENOUS

## 2015-12-19 MED ORDER — OXYCODONE HCL 5 MG PO TABS
5.0000 mg | ORAL_TABLET | ORAL | Status: DC | PRN
  Filled 2015-12-19: qty 2

## 2015-12-19 MED ORDER — RIVAROXABAN 10 MG PO TABS
10.0000 mg | ORAL_TABLET | Freq: Every day | ORAL | Status: DC
  Administered 2015-12-20 – 2015-12-21 (×2): 10 mg via ORAL
  Filled 2015-12-19 (×2): qty 1

## 2015-12-19 MED ORDER — LEVOTHYROXINE SODIUM 50 MCG PO TABS
50.0000 ug | ORAL_TABLET | Freq: Every day | ORAL | Status: DC
  Administered 2015-12-20 – 2015-12-21 (×2): 50 ug via ORAL
  Filled 2015-12-19 (×3): qty 1

## 2015-12-19 MED ORDER — ACETAMINOPHEN 10 MG/ML IV SOLN
1000.0000 mg | Freq: Four times a day (QID) | INTRAVENOUS | Status: AC
  Administered 2015-12-19 – 2015-12-20 (×4): 1000 mg via INTRAVENOUS
  Filled 2015-12-19 (×4): qty 100

## 2015-12-19 MED ORDER — METOCLOPRAMIDE HCL 5 MG/ML IJ SOLN: 10.0000 mg | Freq: Once | INTRAMUSCULAR | Status: DC | PRN

## 2015-12-19 MED ORDER — DIPHENHYDRAMINE HCL 50 MG/ML IJ SOLN
INTRAMUSCULAR | Status: AC
  Filled 2015-12-19: qty 1

## 2015-12-19 MED ORDER — ONDANSETRON HCL 4 MG/2ML IJ SOLN
INTRAMUSCULAR | Status: AC
  Filled 2015-12-19: qty 2

## 2015-12-19 MED ORDER — ACETAMINOPHEN 325 MG PO TABS: 650.0000 mg | ORAL_TABLET | Freq: Four times a day (QID) | ORAL | Status: DC | PRN

## 2015-12-19 MED ORDER — METOCLOPRAMIDE HCL 5 MG PO TABS: 5.0000 mg | ORAL_TABLET | Freq: Three times a day (TID) | ORAL | Status: DC | PRN

## 2015-12-19 MED ORDER — ONDANSETRON HCL 4 MG PO TABS: 4.0000 mg | ORAL_TABLET | Freq: Four times a day (QID) | ORAL | Status: DC | PRN

## 2015-12-19 MED ORDER — FENTANYL CITRATE (PF) 100 MCG/2ML IJ SOLN
INTRAMUSCULAR | Status: DC | PRN
  Administered 2015-12-19 (×2): 25 ug via INTRAVENOUS
  Administered 2015-12-19: 50 ug via INTRAVENOUS
  Administered 2015-12-19 (×2): 25 ug via INTRAVENOUS

## 2015-12-19 MED ORDER — PROPOFOL 10 MG/ML IV BOLUS
INTRAVENOUS | Status: AC
  Filled 2015-12-19: qty 20

## 2015-12-19 MED ORDER — SCOPOLAMINE 1 MG/3DAYS TD PT72
MEDICATED_PATCH | TRANSDERMAL | Status: AC
  Administered 2015-12-19: 1 via TRANSDERMAL
  Filled 2015-12-19: qty 1

## 2015-12-19 SURGICAL SUPPLY — 58 items
BLADE SAW SAG 73X25 THK (BLADE) ×2
BLADE SAW SGTL 73X25 THK (BLADE) ×1 IMPLANT
CAPT HIP TOTAL 2 ×2 IMPLANT
COVER SURGICAL LIGHT HANDLE (MISCELLANEOUS) ×5 IMPLANT
DRAPE INCISE IOBAN 66X45 STRL (DRAPES) ×2 IMPLANT
DRAPE ORTHO SPLIT 77X108 STRL (DRAPES) ×6
DRAPE PROXIMA HALF (DRAPES) ×2 IMPLANT
DRAPE SURG ORHT 6 SPLT 77X108 (DRAPES) ×2 IMPLANT
DRSG MEPILEX BORDER 4X8 (GAUZE/BANDAGES/DRESSINGS) ×2 IMPLANT
DURAPREP 26ML APPLICATOR (WOUND CARE) ×6 IMPLANT
ELECT BLADE 6.5 EXT (BLADE) ×2 IMPLANT
ELECT CAUTERY BLADE 6.4 (BLADE) ×2 IMPLANT
ELECT REM PT RETURN 9FT ADLT (ELECTROSURGICAL) ×3
ELECTRODE REM PT RTRN 9FT ADLT (ELECTROSURGICAL) ×1 IMPLANT
FACESHIELD WRAPAROUND (MASK) ×9 IMPLANT
FACESHIELD WRAPAROUND OR TEAM (MASK) ×3 IMPLANT
GLOVE BIO SURGEON STRL SZ7 (GLOVE) ×6 IMPLANT
GLOVE BIOGEL PI IND STRL 6.5 (GLOVE) IMPLANT
GLOVE BIOGEL PI IND STRL 7.0 (GLOVE) IMPLANT
GLOVE BIOGEL PI IND STRL 8 (GLOVE) ×2 IMPLANT
GLOVE BIOGEL PI IND STRL 8.5 (GLOVE) ×1 IMPLANT
GLOVE BIOGEL PI IND STRL 9 (GLOVE) IMPLANT
GLOVE BIOGEL PI INDICATOR 6.5 (GLOVE) ×2
GLOVE BIOGEL PI INDICATOR 7.0 (GLOVE) ×2
GLOVE BIOGEL PI INDICATOR 8 (GLOVE) ×6
GLOVE BIOGEL PI INDICATOR 8.5 (GLOVE) ×4
GLOVE BIOGEL PI INDICATOR 9 (GLOVE) ×2
GLOVE ECLIPSE 8.0 STRL XLNG CF (GLOVE) ×8 IMPLANT
GLOVE SURG ORTHO 8.5 STRL (GLOVE) ×8 IMPLANT
GOWN STRL REUS W/ TWL LRG LVL3 (GOWN DISPOSABLE) ×2 IMPLANT
GOWN STRL REUS W/TWL 2XL LVL3 (GOWN DISPOSABLE) ×6 IMPLANT
GOWN STRL REUS W/TWL LRG LVL3 (GOWN DISPOSABLE) ×6
IMMOBILIZER KNEE 22 UNIV (SOFTGOODS) ×3 IMPLANT
KIT BASIN OR (CUSTOM PROCEDURE TRAY) ×3 IMPLANT
KIT ROOM TURNOVER OR (KITS) ×3 IMPLANT
MANIFOLD NEPTUNE II (INSTRUMENTS) ×3 IMPLANT
NEEDLE 22X1 1/2 (OR ONLY) (NEEDLE) ×3 IMPLANT
NS IRRIG 1000ML POUR BTL (IV SOLUTION) ×3 IMPLANT
PACK TOTAL JOINT (CUSTOM PROCEDURE TRAY) ×3 IMPLANT
PACK UNIVERSAL I (CUSTOM PROCEDURE TRAY) ×3 IMPLANT
PAD ARMBOARD 7.5X6 YLW CONV (MISCELLANEOUS) ×3 IMPLANT
STAPLER VISISTAT 35W (STAPLE) ×2 IMPLANT
SUCTION FRAZIER HANDLE 10FR (MISCELLANEOUS) ×2
SUCTION TUBE FRAZIER 10FR DISP (MISCELLANEOUS) ×1 IMPLANT
SUT ETHIBOND NAB CT1 #1 30IN (SUTURE) ×9 IMPLANT
SUT MNCRL AB 3-0 PS2 18 (SUTURE) ×3 IMPLANT
SUT VIC AB 0 CT1 27 (SUTURE) ×6
SUT VIC AB 0 CT1 27XBRD ANBCTR (SUTURE) ×2 IMPLANT
SUT VIC AB 1 CT1 27 (SUTURE) ×6
SUT VIC AB 1 CT1 27XBRD ANBCTR (SUTURE) ×2 IMPLANT
SUT VIC AB 2-0 CT1 27 (SUTURE) ×3
SUT VIC AB 2-0 CT1 TAPERPNT 27 (SUTURE) ×1 IMPLANT
SYR CONTROL 10ML LL (SYRINGE) ×3 IMPLANT
TOWEL OR 17X24 6PK STRL BLUE (TOWEL DISPOSABLE) ×3 IMPLANT
TOWEL OR 17X26 10 PK STRL BLUE (TOWEL DISPOSABLE) ×3 IMPLANT
WATER STERILE IRR 1000ML POUR (IV SOLUTION) ×12 IMPLANT
WRAP KNEE MAXI GEL POST OP (GAUZE/BANDAGES/DRESSINGS) ×2 IMPLANT
YANKAUER SUCT BULB TIP NO VENT (SUCTIONS) ×4 IMPLANT

## 2015-12-19 NOTE — Plan of Care (Signed)
Problem: Activity: Goal: Ability to tolerate increased activity will improve Outcome: Not Met (add Reason) First day of sx

## 2015-12-19 NOTE — Progress Notes (Signed)
   12/19/15 1547  Clinical Encounter Type  Visited With Patient and family together  Visit Type Initial  Spiritual Encounters  Spiritual Needs Emotional;Prayer  Stress Factors  Patient Stress Factors Health changes  Family Stress Factors Not reviewed  Chaplain received page to visit patient.  Chaplain visited patient.  Patient shared concerns. Chaplain prayed for patient.  Chaplain made available further support if needed.

## 2015-12-19 NOTE — Op Note (Signed)
PATIENT ID:      [name]  MRN:     [id number] DOB/AGE:    1951-03-19 / 65 y.o.       OPERATIVE REPORT    DATE OF PROCEDURE:  12/19/2015       PREOPERATIVE DIAGNOSIS:   LEFT HIP OSTEOARTHRITIS-END STAGE                                                       Estimated body mass index is 22.3 kg/(m^2) as calculated from the following:   Height as of this encounter: 5\' 4"  (1.626 m).   Weight as of this encounter: 58.968 kg (130 lb).     POSTOPERATIVE DIAGNOSIS:   LEFT HIP OSTEOARTHRITIS-SAME                                                                    Estimated body mass index is 22.3 kg/(m^2) as calculated from the following:   Height as of this encounter: 5\' 4"  (1.626 m).   Weight as of this encounter: 58.968 kg (130 lb).     PROCEDURE:  Procedure(s): LEFT TOTAL HIP ARTHROPLASTY      SURGEON:  Joni Fears, MD    ASSISTANT:   Biagio Borg, PA-C   (Present and scrubbed throughout the case, critical for assistance with exposure, retraction, instrumentation, and closure.)          ANESTHESIA: spinal and IV sedation     DRAINS: none :      TOURNIQUET TIME: * No tourniquets in log *    COMPLICATIONS:  None   CONDITION:  stable  PROCEDURE IN DETAIL:    [name] W 12/19/2015, 10:02 AM

## 2015-12-19 NOTE — H&P (Signed)
  The recent History & Physical has been reviewed. I have personally examined the patient today. There is no interval change to the documented History & Physical. The patient would like to proceed with the procedure.  [name] W 12/19/2015,  7:09 AM

## 2015-12-19 NOTE — Op Note (Signed)
[name], [name]          ACCOUNT NO.:  [id number]  MEDICAL RECORD NO.:  [id number]  LOCATION:  [facility]                         FACILITY:  [facility]  PHYSICIAN:  Claude Manges. Margean Korell, M.D.DATE OF BIRTH:  07/02/1950  DATE OF PROCEDURE:  12/19/2015 DATE OF DISCHARGE:                              OPERATIVE REPORT   PREOPERATIVE DIAGNOSIS:  End-stage osteoarthritis, left hip.  POSTOPERATIVE DIAGNOSIS:  End-stage osteoarthritis, left hip.  PROCEDURE:  Left total hip replacement.  SURGEON:  Claude Manges. Cleophas Dunker, M.D.  ASSISTANT:  Arlys John D. Petrarca, P.A.-C.  ANESTHESIA:  Spinal with supplemental IV sedation.  COMPLICATIONS:  None.  COMPONENTS:  DePuy AML small-stature 12-mm femoral component, 8.5-mm outer-diameter femoral head, neck length with 36-mm outer diameter hip ball (metallic).  A Sector 3, 52-mm outer-diameter acetabular component with an apex hole eliminator, a marathon polyethylene liner, +4 with a 10-degree posterior lip.  All components were press-fit.  DESCRIPTION OF PROCEDURE:  [name] was met with her husband in the holding area, identified the left knee as appropriate operative site and marked it accordingly and questions were answered.  The patient was then transported to room #11 and placed under spinal anesthetic without difficulty.  Under IV sedation, the patient was placed in the lateral decubitus position with left side up and secured to the operating table with Innomed hip system.  Time-out was called.  The left lower extremity was then prepped the iliac crest to the ankle with chlorhexidine scrub and DuraPrep x2.  Sterile draping was performed.  Time-out was called again.  Routine Southern incision was utilized and via sharp dissection, carried down to the subcutaneous tissue.  Gross bleeders were Bovie coagulated. Adipose tissue was carefully incised and Bovie coagulated.  The IT band was identified and incised.  Self-retaining retractors were  inserted. By manual dissection, the fibers of the glutei were then separated, demonstrating the short external rotators.  These were tagged with 0 Ethibond suture and then incised near their attachment to the posterior aspect of the greater trochanter.  The capsule was identified and incised.  The joint was entered.  There was minimal clear yellow joint effusion.  The femoral head was easily dislocated posteriorly.  There were large areas of articular cartilage loss.  Using the calcar guide, osteotomy was made at the femoral head and neck junction.  Retractors were then placed around the femur, center hole was made in the piriformis fossa and reaming was performed to 12.5 mm. Rasping was performed using the short-stature 12-mm component.  We thought that 12 at that point was very stable and it was excellent canal fit, I was concerned about using any larger component for fear of causing the fracture.  Retractors were then placed around the acetabulum.  A small amount of synovitis was resected as well as the labrum.  Reaming was performed sequentially starting at 43 mm to 51 mm.  We inserted the acetabular component, felt that it was too uncovered anteriorly, we obtained an x-ray and thought we need to deepen the acetabulum, so this was deepened using the same reamers.  Then, we reinserted the acetabular component and felt like this was a perfect fit.  The wound was then irrigated with saline solution.  I used a Sector 3 Gription 52-mm outer-diameter acetabular component, impacted, flushed, nice and tight and nicely covered in all planes.  We inserted the trial polyethylene liner followed by the 12 small-stature femoral component and we trialed several neck lengths, felt that the 8.5 reestablished leg lengths and was perfectly stable with 36-mm outer-diameter hip ball.  All the trial components were then carefully removed.  We copiously irrigated the joint with saline solution.   The apex hole eliminator was inserted followed by the Marathon polyethylene liner +4 with a 10-degree posterior lip.  The wound was again irrigated with saline solution.  A final 12-mm small-stature trial component was then impacted in about 15 degrees of anteversion.  We did pack some bone into the canal as we inserted the component from the acetabular reaming.  The wound was copiously irrigated with saline solution.  We applied the 8.5-mm neck with a 36-mm outer-diameter hip ball.  We checked the acetabulum, to be sure it was clean and then reduced the entire construct.  With the full range of motion, there was no instability, there was no toggling, we felt we were not too tight, excellent leg lengths.  The wound was again irrigated with saline solution.  The capsule was closed anatomically with interrupted #1 Ethibond.  Short external rotator was closed with the similar material.  IT band was closed with a running 1 Vicryl, subcu in several layers with 3-0 Vicryl and 0 Monocryl.  Skin closed skin clips.  Sterile bulky dressing was applied.  The patient was then placed supine, knee immobilizer placed in the left lower extremity.  The patient was then transported to the postanesthesia recovery room without complications.  I did palpate the sciatic nerve throughout the procedure, felt it was well out of harm's way.     Vonna Kotyk. Durward Fortes, M.D.     PWW/MEDQ  D:  12/19/2015  T:  12/19/2015  Job:  [id number]

## 2015-12-19 NOTE — Anesthesia Procedure Notes (Addendum)
Spinal Patient location during procedure: OR Start time: 12/19/2015 7:22 AM Staffing Anesthesiologist: Josephine Igo Performed by: anesthesiologist  Preanesthetic Checklist Completed: patient identified, site marked, surgical consent, pre-op evaluation, timeout performed, IV checked, risks and benefits discussed and monitors and equipment checked Spinal Block Patient position: sitting Prep: site prepped and draped and DuraPrep Patient monitoring: heart rate, cardiac monitor, continuous pulse ox and blood pressure Approach: midline Location: L4-5 Injection technique: single-shot Needle Needle type: Pencan  Needle gauge: 24 G Needle length: 9 cm Needle insertion depth: 4.5 cm Assessment Sensory level: T6 Additional Notes Patient tolerated procedure well. Adequate sensory level.  Procedure Name: MAC Date/Time: 12/19/2015 7:20 AM Performed by: Mariea Clonts Pre-anesthesia Checklist: Patient identified, Patient being monitored, Emergency Drugs available, Timeout performed and Suction available Patient Re-evaluated:Patient Re-evaluated prior to inductionOxygen Delivery Method: Nasal cannula

## 2015-12-19 NOTE — Transfer of Care (Signed)
Immediate Anesthesia Transfer of Care Note  Patient: [name]  Procedure(s) Performed: Procedure(s): LEFT TOTAL HIP ARTHROPLASTY (Left)  Patient Location: PACU  Anesthesia Type:Spinal  Level of Consciousness: awake, alert  and oriented  Airway & Oxygen Therapy: Patient Spontanous Breathing and Patient connected to nasal cannula oxygen  Post-op Assessment: Report given to RN and Post -op Vital signs reviewed and stable  Post vital signs: Reviewed and stable  Last Vitals:  Filed Vitals:   12/19/15 0648  BP: 120/74  Pulse: 54  Temp: 36.4 C  Resp: 18    Last Pain: There were no vitals filed for this visit.       Complications: No apparent anesthesia complications

## 2015-12-19 NOTE — Discharge Instructions (Signed)

## 2015-12-19 NOTE — Evaluation (Signed)
Physical Therapy Evaluation Patient Details Name: [name] MRN: [id number] DOB: Jun 09, 1951 Today's Date: 12/19/2015   History of Present Illness  Patient is a 65 y/o female with hx of hypothyroidism presents s/p Left THA.   Clinical Impression  Patient presents with dizziness, hypotension, and post surgical deficits LLE s/p Lft THA. Education provided on posterior hip precautions. Attempted to instruct pt in exercises but not able to comprehend due to dizziness, pain. RN notified of low BP. Assessment limited due to above. Encouraged pt to get to chair once feeling better or BP improves. Pt will have support from spouse at home. Will follow acutely to maximize independence and mobility prior to return home. Will need to negotiate 1 flight of steps to get to bedroom prior to d/c.    Follow Up Recommendations Home health PT;Supervision - Intermittent    Equipment Recommendations  Rolling walker with 5" wheels    Recommendations for Other Services OT consult     Precautions / Restrictions Precautions Precautions: Posterior Hip Precaution Booklet Issued: No Precaution Comments: Reviewed precautions. Required Braces or Orthoses: Knee Immobilizer - Left Restrictions Weight Bearing Restrictions: Yes LLE Weight Bearing: Weight bearing as tolerated      Mobility  Bed Mobility Overal bed mobility: Needs Assistance Bed Mobility: Supine to Sit;Sit to Supine     Supine to sit: Min assist Sit to supine: Min assist   General bed mobility comments: Assist to mobilize LLE into/out of bed. + dizziness, pallor, lightheadedness  Transfers                 General transfer comment: Deferred secondary to hypotension and symptomatic.  Ambulation/Gait                Stairs            Wheelchair Mobility    Modified Rankin (Stroke Patients Only)       Balance Overall balance assessment: Needs assistance Sitting-balance support: Feet supported;No upper  extremity supported Sitting balance-Leahy Scale: Good                                       Pertinent Vitals/Pain Pain Assessment: Faces Faces Pain Scale: Hurts even more Pain Location: butt Pain Descriptors / Indicators: Sore Pain Intervention(s): Monitored during session;Premedicated before session;Repositioned    Home Living Family/patient expects to be discharged to:: Private residence Living Arrangements: Spouse/significant other Available Help at Discharge: Family;Available 24 hours/day Type of Home: House Home Access: Stairs to enter Entrance Stairs-Rails: None Entrance Stairs-Number of Steps: 2 Home Layout: Two level;Bed/bath upstairs Home Equipment: Cane - single point      Prior Function Level of Independence: Independent         Comments: Plays tennis, just retired prior to knowing about the surgery     Hand Dominance        Extremity/Trunk Assessment   Upper Extremity Assessment: Defer to OT evaluation           Lower Extremity Assessment: LLE deficits/detail   LLE Deficits / Details: Able to mobilize ankle without difficulty. Pt in KI  Cervical / Trunk Assessment: Normal  Communication   Communication: No difficulties  Cognition Arousal/Alertness: Lethargic Behavior During Therapy: WFL for tasks assessed/performed Overall Cognitive Status: Within Functional Limits for tasks assessed                      General  Comments General comments (skin integrity, edema, etc.): Daughter present in room. BP EOB 78/51, pallor, dizzy; BP back in bed 88/48. RN notified.    Exercises Total Joint Exercises Ankle Circles/Pumps: Both;10 reps;Supine      Assessment/Plan    PT Assessment Patient needs continued PT services  PT Diagnosis Difficulty walking;Acute pain   PT Problem List Decreased strength;Pain;Cardiopulmonary status limiting activity;Decreased activity tolerance;Decreased balance;Decreased mobility;Decreased  knowledge of precautions;Decreased knowledge of use of DME  PT Treatment Interventions Balance training;Gait training;Stair training;Functional mobility training;Therapeutic activities;Therapeutic exercise;Patient/family education;DME instruction   PT Goals (Current goals can be found in the Care Plan section) Acute Rehab PT Goals Patient Stated Goal: to get back to playing tennis PT Goal Formulation: With patient Time For Goal Achievement: 01/02/16 Potential to Achieve Goals: Good    Frequency 7X/week   Barriers to discharge Inaccessible home environment stairs to get to bedroom    Co-evaluation               End of Session Equipment Utilized During Treatment: Gait belt;Left knee immobilizer Activity Tolerance: Treatment limited secondary to medical complications (Comment) (symptomatic hypotension) Patient left: in bed;with call bell/phone within reach;with bed alarm set;Other (comment);with family/visitor present (on bed pan, RN notified) Nurse Communication: Mobility status         Time: [id number] PT Time Calculation (min) (ACUTE ONLY): 20 min   Charges:   PT Evaluation $PT Eval Moderate Complexity: 1 Procedure     PT G Codes:        Erubiel Manasco A Judye Lorino 12/19/2015, 3:33 PM Wray Kearns, [place], DPT [phone]

## 2015-12-20 ENCOUNTER — Encounter (HOSPITAL_COMMUNITY): Payer: Self-pay | Admitting: Orthopaedic Surgery

## 2015-12-20 LAB — CBC
HCT: 25.5 % — ABNORMAL LOW (ref 36.0–46.0)
HEMOGLOBIN: 8.7 g/dL — AB (ref 12.0–15.0)
MCH: 30.9 pg (ref 26.0–34.0)
MCHC: 34.1 g/dL (ref 30.0–36.0)
MCV: 90.4 fL (ref 78.0–100.0)
Platelets: 173 10*3/uL (ref 150–400)
RBC: 2.82 MIL/uL — AB (ref 3.87–5.11)
RDW: 12.6 % (ref 11.5–15.5)
WBC: 5.1 10*3/uL (ref 4.0–10.5)

## 2015-12-20 LAB — BASIC METABOLIC PANEL
ANION GAP: 5 (ref 5–15)
BUN: 13 mg/dL (ref 6–20)
CHLORIDE: 102 mmol/L (ref 101–111)
CO2: 29 mmol/L (ref 22–32)
Calcium: 8.4 mg/dL — ABNORMAL LOW (ref 8.9–10.3)
Creatinine, Ser: 0.78 mg/dL (ref 0.44–1.00)
GFR calc Af Amer: >60 mL/min (ref >60)
Glucose, Bld: 114 mg/dL — ABNORMAL HIGH (ref 65–99)
POTASSIUM: 4.1 mmol/L (ref 3.5–5.1)
SODIUM: 136 mmol/L (ref 135–145)

## 2015-12-20 MED ORDER — METHOCARBAMOL 500 MG PO TABS: 500.0000 mg | ORAL_TABLET | Freq: Three times a day (TID) | ORAL | Status: DC | PRN

## 2015-12-20 MED ORDER — HYDROCODONE-ACETAMINOPHEN 5-325 MG PO TABS
1.0000 | ORAL_TABLET | ORAL | Status: DC | PRN
  Administered 2015-12-20 (×2): 1 via ORAL
  Administered 2015-12-21: 2 via ORAL
  Filled 2015-12-20 (×2): qty 2
  Filled 2015-12-20: qty 1

## 2015-12-20 MED ORDER — SODIUM CHLORIDE 0.9 % IV SOLN
INTRAVENOUS | Status: AC
  Administered 2015-12-20: 09:00:00 via INTRAVENOUS

## 2015-12-20 MED ORDER — HYDROCODONE-ACETAMINOPHEN 5-325 MG PO TABS: 1.0000 | ORAL_TABLET | ORAL | Status: DC | PRN

## 2015-12-20 MED ORDER — HYDROMORPHONE HCL 1 MG/ML IJ SOLN: 0.5000 mg | INTRAMUSCULAR | Status: DC | PRN

## 2015-12-20 MED ORDER — RIVAROXABAN 10 MG PO TABS: 10.0000 mg | ORAL_TABLET | Freq: Every day | ORAL | Status: DC

## 2015-12-20 NOTE — Evaluation (Signed)
Occupational Therapy Evaluation Patient Details Name: [name] MRN: [id number] DOB: 07/11/1950 Today's Date: 12/20/2015    History of Present Illness Patient is a 65 y/o female with hx of hypothyroidism presents s/p Left THA.    Clinical Impression   [name]. Presents with minimal pain in buttocks, generalized weakness and poor standing balance. Pt able to state 3/3 posterior hip precautions. Began education in use of AE. Pt with symptomatic hypotension at EOB, so session limited. Will continue to follow.    Follow Up Recommendations  No OT follow up    Equipment Recommendations  3 in 1 bedside comode    Recommendations for Other Services       Precautions / Restrictions Precautions Precautions: Fall;Posterior Hip Precaution Booklet Issued: Yes (comment) Precaution Comments: Reviewed precautions. Required Braces or Orthoses: Knee Immobilizer - Left Knee Immobilizer - Left:  (in bed) Restrictions Weight Bearing Restrictions: Yes LLE Weight Bearing: Weight bearing as tolerated      Mobility Bed Mobility Overal bed mobility: Needs Assistance Bed Mobility: Supine to Sit;Sit to Supine     Supine to sit: Min assist Sit to supine: Min assist   General bed mobility comments: MIN A to L LE management  Transfers Overall transfer level: Needs assistance Equipment used: Rolling walker (2 wheeled) Transfers: Sit to/from Stand Sit to Stand: Min guard         General transfer comment: discussed seating when pt returns home that is high enough to adhere to hip precautions    Balance Overall balance assessment: Needs assistance   Sitting balance-Leahy Scale: Good     Standing balance support: Bilateral upper extremity supported Standing balance-Leahy Scale: Poor Standing balance comment: requires UE support                            ADL Overall ADL's : Needs assistance/impaired Eating/Feeding:  Independent;Sitting   Grooming: Wash/dry hands;Sitting;Set up   Upper Body Bathing: Set up;Sitting   Lower Body Bathing: Sit to/from stand;Moderate assistance Lower Body Bathing Details (indicate cue type and reason): educated in use of long handled bath sponge Upper Body Dressing : Set up;Sitting   Lower Body Dressing: Moderate assistance;Sit to/from stand Lower Body Dressing Details (indicate cue type and reason): educated in use of AE, husband to purchase in gift shop Toilet Transfer: BSC;Stand-pivot;RW;Min guard   Toileting- Water quality scientist and Hygiene: Sit to/from stand;Min guard       Functional mobility during ADLs: Minimal assistance;Rolling walker General ADL Comments: educated in safe footwear and transporting items safely with walker     Vision     Perception     Praxis      Pertinent Vitals/Pain Pain Assessment: Faces Pain Score: 2  Faces Pain Scale: Hurts a little bit Pain Location: buttocks Pain Descriptors / Indicators: Sore Pain Intervention(s): Monitored during session;Repositioned     Hand Dominance Right   Extremity/Trunk Assessment Upper Extremity Assessment Upper Extremity Assessment: Overall WFL for tasks assessed   Lower Extremity Assessment Lower Extremity Assessment: Defer to PT evaluation   Cervical / Trunk Assessment Cervical / Trunk Assessment: Normal   Communication Communication Communication: No difficulties   Cognition Arousal/Alertness: Awake/alert Behavior During Therapy: WFL for tasks assessed/performed Overall Cognitive Status: Within Functional Limits for tasks assessed       Memory: Decreased recall of precautions             General Comments  Exercises      Shoulder Instructions      Home Living Family/patient expects to be discharged to:: Private residence Living Arrangements: Spouse/significant other Available Help at Discharge: Family;Available 24 hours/day Type of Home: House Home  Access: Stairs to enter [employer] of Steps: 2 Entrance Stairs-Rails: None Home Layout: Two level;Bed/bath upstairs Alternate Level Stairs-Number of Steps: 1 flight Alternate Level Stairs-Rails: Right Bathroom Shower/Tub: Walk-in shower;Tub/shower unit   Bathroom Toilet: Standard     Home Equipment: Cane - single point;Shower seat;Hand held shower head   Additional Comments: may have a reacher at home      Prior Functioning/Environment Level of Independence: Independent        Comments: Plays tennis, just retired prior to knowing about the surgery    OT Diagnosis: Generalized weakness;Acute pain   OT Problem List: Decreased strength;Decreased activity tolerance;Impaired balance (sitting and/or standing);Decreased knowledge of use of DME or AE;Decreased knowledge of precautions;Pain   OT Treatment/Interventions: Self-care/ADL training;DME and/or AE instruction;Patient/family education    OT Goals(Current goals can be found in the care plan section) Acute Rehab OT Goals Patient Stated Goal: to get back to playing tennis OT Goal Formulation: With patient Time For Goal Achievement: 12/27/15 Potential to Achieve Goals: Good ADL Goals Pt Will Perform Grooming: with supervision;standing Pt Will Perform Lower Body Bathing: with supervision;with adaptive equipment;sit to/from stand Pt Will Perform Lower Body Dressing: with supervision;with adaptive equipment;sit to/from stand Pt Will Transfer to Toilet: with supervision;ambulating;bedside commode (over toilet) Pt Will Perform Toileting - Clothing Manipulation and hygiene: with supervision;with adaptive equipment;sit to/from stand Pt Will Perform Tub/Shower Transfer: Shower transfer;with supervision;ambulating;shower seat;rolling walker  OT Frequency: Min 2X/week   Barriers to D/C:            Co-evaluation              End of Session    Activity Tolerance: Treatment limited secondary to medical  complications (Comment) (BP 95/44 with nausea) Patient left: in bed;with call bell/phone within reach;with family/visitor present   Time: 1538-1600 OT Time Calculation (min): 22 min Charges:  OT General Charges $OT Visit: 1 Procedure OT Evaluation $OT Eval Moderate Complexity: 1 Procedure G-Codes:    Malka So 12/20/2015, 4:16 PM [phone]

## 2015-12-20 NOTE — Progress Notes (Signed)
Pt BP noted to have dropped back to the 80's after fluid stopped. Fluids rate changed back to 111ml/hr and pt BP back to the 90's. Pt asymptomatic and reported off to oncoming RN. Delia Heady RN

## 2015-12-20 NOTE — Discharge Summary (Signed)
Lisa Fears, MD   Biagio Borg, PA-C [address] [place], [place]  [address]                             [phone]  PATIENT ID: [name]        MRN:  [id number]          DOB/AGE: [birth year] / 65 y.o.    DISCHARGE SUMMARY  ADMISSION DATE:    12/19/2015 DISCHARGE DATE:   12/21/2015   ADMISSION DIAGNOSIS: LEFT HIP OSTEOARTHRITIS    DISCHARGE DIAGNOSIS:  LEFT HIP OSTEOARTHRITIS    ADDITIONAL DIAGNOSIS: Principal Problem:   Primary osteoarthritis of left hip Active Problems:   Status post total replacement of left hip  Past Medical History  Diagnosis Date  . Degenerative disc disease   . Arthritis   . Cataract   . Complication of anesthesia     SPINAL  -  CONVULSIONS (PLACED IN WRONG PLACE)  . PONV (postoperative nausea and vomiting)   . Hypothyroidism     PROCEDURE: Procedure(s): LEFT TOTAL HIP ARTHROPLASTY on 12/19/2015  CONSULTS: none     HISTORY: [name] is a very pleasant 65 year old white female who is seen today for evaluation of her left hip and groin. We have seen her previously for evaluation and have had corticosteroid injections to the left hip which had initially questionable results, the second injection was not of any benefit. She has now gotten to the point where she is having nighttime pain, as well as pain with activities of daily living. She finds now that she is having quite a difficult time with playing tennis. She continues to have difficulties with her activities of daily living. She does not remember any history of injury or trauma, but has been a fairly aggressive tennis player.  HOSPITAL COURSE:  [name] is a 65 y.o. admitted on 12/19/2015 and found to have a diagnosis of LEFT HIP OSTEOARTHRITIS.  After appropriate laboratory studies were obtained  they were taken to the operating room on 12/19/2015 and underwent  Procedure(s): LEFT TOTAL HIP ARTHROPLASTY     They were given perioperative antibiotics:    Anti-infectives    Start     Dose/Rate Route Frequency Ordered Stop   12/19/15 1330  ceFAZolin (ANCEF) IVPB 1 g/50 mL premix     1 g 100 mL/hr over 30 Minutes Intravenous Every 6 hours 12/19/15 1221 12/19/15 2207   12/19/15 0700  ceFAZolin (ANCEF) IVPB 2g/100 mL premix     2 g 200 mL/hr over 30 Minutes Intravenous To ShortStay Surgical 12/18/15 0927 12/19/15 0730    .  Tolerated the procedure well.  Marland Kitchen  POD #1, allowed out of bed to a chair.  PT for ambulation and exercise program.  BP decreased as well as HGB.  IV saline bolus 125 ml/hr for 3 hours given.  O2 discontionued.  POD #2, continued PT and ambulation.    The remainder of the hospital course was dedicated to ambulation and strengthening.   The patient was discharged on 2 Day Post-Op in  Stable condition.  Blood products given:none  DIAGNOSTIC STUDIES: Recent vital signs:  Patient Vitals for the past 24 hrs:  BP Temp Temp src Pulse Resp SpO2  12/20/15 0432 (!) 89/49 mmHg 98.4 F (36.9 C) Oral 60 16 99 %  12/20/15 0010 (!) 88/43 mmHg 98.6 F (37 C) Oral (!) 58 16 100 %  12/19/15 1949 (!) 93/48  mmHg 97.4 F (36.3 C) Oral (!) 57 16 98 %  12/19/15 1221 110/75 mmHg 97.6 F (36.4 C) - (!) 53 14 100 %  12/19/15 1200 100/64 mmHg - - (!) 48 11 100 %  12/19/15 1145 - 97.5 F (36.4 C) - (!) 47 13 100 %  12/19/15 1130 98/62 mmHg - - (!) 48 11 100 %  12/19/15 1115 100/60 mmHg - - (!) 46 10 100 %  12/19/15 1100 (!) 90/58 mmHg - - (!) 47 (!) 7 100 %  12/19/15 1045 94/62 mmHg - - (!) 50 14 100 %  12/19/15 1030 97/64 mmHg - - (!) 43 11 100 %  12/19/15 1021 100/69 mmHg 97.2 F (36.2 C) - (!) 50 12 100 %       Recent laboratory studies:  Recent Labs  12/20/15 0550  WBC 5.1  HGB 8.7*  HCT 25.5*  PLT 173    Recent Labs  12/20/15 0550  NA 136  K 4.1  CL 102  CO2 29  BUN 13  CREATININE 0.78  GLUCOSE 114*  CALCIUM 8.4*   Lab Results  Component Value Date   INR 1.04 12/12/2015     Recent Radiographic Studies  :  Dg Chest 2 View  12/12/2015  CLINICAL DATA:  Preoperative testing EXAM: CHEST  2 VIEW COMPARISON:  None. FINDINGS: Normal heart size and mediastinal contours. No acute infiltrate or edema. No effusion or pneumothorax. Mild pectus excavatum. No acute osseous findings. IMPRESSION: No active cardiopulmonary disease. Electronically Signed   By: Monte Fantasia M.D.   On: 12/12/2015 15:53   Dg Hip Operative Unilat W Or W/o Pelvis Left  12/19/2015  CLINICAL DATA:  Left total hip arthroplasty EXAM: OPERATIVE LEFT HIP (WITH PELVIS IF PERFORMED) 3 VIEWS TECHNIQUE: Portable intraoperative radiographs were submitted. COMPARISON:  None. FINDINGS: Three intraoperative portable left hip radiographs demonstrate left acetabular prosthesis, which appears well positioned on these views. Surgical resection of the left femoral head. IMPRESSION: Postsurgical changes in the left hip as described. Electronically Signed   By: Ilona Sorrel M.D.   On: 12/19/2015 09:50   Dg Hip Unilat With Pelvis 2-3 Views Left  12/19/2015  CLINICAL DATA:  Increasing pain in left hip ever since left total hip surgery this morning EXAM: DG HIP (WITH OR WITHOUT PELVIS) 2-3V LEFT COMPARISON:  None. FINDINGS: Left total hip arthroplasty. No fracture or dislocation. Soft tissue emphysema in the surrounding soft tissue consistent with postsurgical changes. Degenerative changes of bilateral SI joints. IMPRESSION: Left total hip arthroplasty with postsurgical changes in the surrounding soft tissues. Electronically Signed   By: Kathreen Devoid   On: 12/19/2015 18:48    DISCHARGE INSTRUCTIONS:     Discharge Instructions    Call MD / Call 911    Complete by:  As directed   If you experience chest pain or shortness of breath, CALL 911 and be transported to the hospital emergency room.  If you develope a fever above 101 F, pus (white drainage) or increased drainage or redness at the wound, or calf pain, call your surgeon's office.     Change dressing     Complete by:  As directed   DO NOT CHANGE THE DRESSING     Constipation Prevention    Complete by:  As directed   Drink plenty of fluids.  Prune juice may be helpful.  You may use a stool softener, such as Colace (over the counter) 100 mg twice a day.  Use MiraLax (over  the counter) for constipation as needed.     Diet general    Complete by:  As directed      Discharge instructions    Complete by:  As directed   [place] items at home which could result in a fall. This includes throw rugs or furniture in walking pathways ICE to the affected joint every three hours while awake for 30 minutes at a time, for at least the first 3-5 days, and then as needed for pain and swelling.  Continue to use ice for pain and swelling. You may notice swelling that will progress down to the foot and ankle.  This is normal after surgery.  Elevate your leg when you are not up walking on it.   Continue to use the breathing machine you got in the hospital (incentive spirometer) which will help keep your temperature down.  It is common for your temperature to cycle up and down following surgery, especially at night when you are not up moving around and exerting yourself.  The breathing machine keeps your lungs expanded and your temperature down.   DIET:  As you were doing prior to hospitalization, we recommend a well-balanced diet.  DRESSING / WOUND CARE / SHOWERING  Keep the surgical dressing until follow up.  The dressing is water proof, so you can shower without any extra covering.  IF THE DRESSING FALLS OFF or the wound gets wet inside, change the dressing with sterile gauze.  Please use good hand washing techniques before changing the dressing.  Do not use any lotions or creams on the incision until instructed by your surgeon.    ACTIVITY  Increase activity slowly as tolerated, but follow the weight bearing instructions below.   No driving for 6 weeks or until further  direction given by your physician.  You cannot drive while taking narcotics.  No lifting or carrying greater than 10 lbs. until further directed by your surgeon. Avoid periods of inactivity such as sitting longer than an hour when not asleep. This helps prevent blood clots.  You may return to work once you are authorized by your doctor.     WEIGHT BEARING   Weight bearing as tolerated with assist device (walker, cane, etc) as directed, use it as long as suggested by your surgeon or therapist, typically at least 4-6 weeks.   EXERCISES  Results after joint replacement surgery are often greatly improved when you follow the exercise, range of motion and muscle strengthening exercises prescribed by your doctor. Safety measures are also important to protect the joint from further injury. Any time any of these exercises cause you to have increased pain or swelling, decrease what you are doing until you are comfortable again and then slowly increase them. If you have problems or questions, call your caregiver or physical therapist for advice.   Rehabilitation is important following a joint replacement. After just a few days of immobilization, the muscles of the leg can become weakened and shrink (atrophy).  These exercises are designed to build up the tone and strength of the thigh and leg muscles and to improve motion. Often times heat used for twenty to thirty minutes before working out will loosen up your tissues and help with improving the range of motion but do not use heat for the first two weeks following surgery (sometimes heat can increase post-operative swelling).   These exercises can be done on a training (exercise) mat, on a table or on  a bed. Use whatever works the best and is most comfortable for you.    Use music or television while you are exercising so that the exercises are a pleasant break in your day. This will make your life better with the exercises acting as a break in your routine  that you can look forward to.   Perform all exercises about fifteen times, three times per day or as directed.  You should exercise both the operative leg and the other leg as well.   Exercises include:  Quad Sets - Tighten up the muscle on the front of the thigh (Quad) and hold for 5-10 seconds.   Straight Leg Raises - With your knee straight (if you were given a brace, keep it on), lift the leg to 60 degrees, hold for 3 seconds, and slowly lower the leg.  Perform this exercise against resistance later as your leg gets stronger.  Leg Slides: Lying on your back, slowly slide your foot toward your buttocks, bending your knee up off the floor (only go as far as is comfortable). Then slowly slide your foot back down until your leg is flat on the floor again.  Angel Wings: Lying on your back spread your legs to the side as far apart as you can without causing discomfort.  Hamstring Strength:  Lying on your back, push your heel against the floor with your leg straight by tightening up the muscles of your buttocks.  Repeat, but this time bend your knee to a comfortable angle, and push your heel against the floor.  You may put a pillow under the heel to make it more comfortable if necessary.   A rehabilitation program following joint replacement surgery can speed recovery and prevent re-injury in the future due to weakened muscles. Contact your doctor or a physical therapist for more information on knee rehabilitation.    CONSTIPATION  Constipation is defined medically as fewer than three stools per week and severe constipation as less than one stool per week.  Even if you have a regular bowel pattern at home, your normal regimen is likely to be disrupted due to multiple reasons following surgery.  Combination of anesthesia, postoperative narcotics, change in appetite and fluid intake all can affect your bowels.   YOU MUST use at least one of the following options; they are listed in order of increasing  strength to get the job done.  They are all available over the counter, and you may need to use some, POSSIBLY even all of these options:    Drink plenty of fluids (prune juice may be helpful) and high fiber foods Colace 100 mg by mouth twice a day  Senokot for constipation as directed and as needed Dulcolax (bisacodyl), take with full glass of water  Miralax (polyethylene glycol) once or twice a day as needed.  If you have tried all these things and are unable to have a bowel movement in the first 3-4 days after surgery call either your surgeon or your primary doctor.    If you experience loose stools or diarrhea, hold the medications until you stool forms back up.  If your symptoms do not get better within 1 week or if they get worse, check with your doctor.  If you experience "the worst abdominal pain ever" or develop nausea or vomiting, please contact the office immediately for further recommendations for treatment.   ITCHING:  If you experience itching with your medications, try taking only a single pain pill, or even  half a pain pill at a time.  You can also use Benadryl over the counter for itching or also to help with sleep.   TED HOSE STOCKINGS:  Use stockings on both legs until for at least 2 weeks or as directed by physician office. They may be removed at night for sleeping.  MEDICATIONS:  See your medication summary on the "After Visit Summary" that nursing will review with you.  You may have some home medications which will be placed on hold until you complete the course of blood thinner medication.  It is important for you to complete the blood thinner medication as prescribed.  PRECAUTIONS:  If you experience chest pain or shortness of breath - call 911 immediately for transfer to the hospital emergency department.   If you develop a fever greater that 101 F, purulent drainage from wound, increased redness or drainage from wound, foul odor from the wound/dressing, or calf pain -  CONTACT YOUR SURGEON.                                                   FOLLOW-UP APPOINTMENTS:  If you do not already have a post-op appointment, please call the office for an appointment to be seen by your surgeon.  Guidelines for how soon to be seen are listed in your "After Visit Summary", but are typically between 1-4 weeks after surgery.  OTHER INSTRUCTIONS:   Knee Replacement:  Do not place pillow under knee, focus on keeping the knee straight while resting. CPM instructions: 0-90 degrees, 2 hours in the morning, 2 hours in the afternoon, and 2 hours in the evening. Place foam block, curve side up under heel at all times except when in CPM or when walking.  DO NOT modify, tear, cut, or change the foam block in any way.  MAKE SURE YOU:  Understand these instructions.  Get help right away if you are not doing well or get worse.    Thank you for letting us be a part of your medical care team.  It is a privilege we respect greatly.  We hope these instructions will help you stay on track for a fast and full recovery!     Driving restrictions    Complete by:  As directed   No driving for 6 weeks     Follow the hip precautions as taught in Physical Therapy    Complete by:  As directed      Increase activity slowly as tolerated    Complete by:  As directed      Lifting restrictions    Complete by:  As directed   No lifting for 6 weeks     Patient may shower    Complete by:  As directed   You may shower over the brown dressing     TED hose    Complete by:  As directed   Use stockings (TED hose) for 3 weeks on left leg.  You may remove them at night for sleeping.     Weight bearing as tolerated    Complete by:  As directed   Laterality:  left  Extremity:  Lower           DISCHARGE MEDICATIONS:     Medication List    STOP taking these medications        ALEVE  220 MG Caps  Generic drug:  Naproxen Sodium     GLUCOSAMINE-MSM DS PO     ibuprofen 200 MG tablet  Commonly  known as:  ADVIL,MOTRIN     Turmeric Curcumin 500 MG Caps      TAKE these medications        Biotin 5000 MCG Tabs  Take 5,000 mcg by mouth daily.     Calcium-Magnesium-Vitamin D 300-20-200 MG-MG-UNIT Chew  Chew 1 tablet by mouth 2 (two) times daily.     ferrous sulfate 325 (65 FE) MG EC tablet  Take 325 mg by mouth at bedtime.     HYDROcodone-acetaminophen 5-325 MG tablet  Commonly known as:  NORCO/VICODIN  Take 1-2 tablets by mouth every 4 (four) hours as needed for moderate pain or severe pain.     levothyroxine 50 MCG tablet  Commonly known as:  SYNTHROID, LEVOTHROID  Take 1 tablet by mouth every morning     methocarbamol 500 MG tablet  Commonly known as:  ROBAXIN  Take 1 tablet (500 mg total) by mouth every 8 (eight) hours as needed for muscle spasms.     multivitamin tablet  Take 1 tablet by mouth daily.     rivaroxaban 10 MG Tabs tablet  Commonly known as:  XARELTO  Take 1 tablet (10 mg total) by mouth daily with breakfast.        FOLLOW UP VISIT:   Follow-up Information    Follow up with [facility], PA-C On 01/01/2016.   Specialty:  Orthopedic Surgery   Contact information:   [place] [address] [phone]       DISPOSITION:   Home  CONDITION:  Stable   Mike [address]. [place], [place] [phone]  12/20/2015 8:07 AM

## 2015-12-20 NOTE — Progress Notes (Signed)
Patient ID: [name], female   DOB: 26-Sep-1950, 65 y.o.   MRN: [id number] PATIENT ID: BANESA ALTEPETER        MRN:  [id number]          DOB/AGE: 06-Feb-1951 / 65 y.o.    Joni Fears, MD   Biagio Borg, PA-C [address] [place], [place]  [address]                             [phone]   PROGRESS NOTE  Subjective:  negative for Chest Pain  negative for Shortness of Breath    negative for Calf Pain    Tolerating Diet: yes         Patient reports pain as moderate.     Taking dilaudid  Objective: Vital signs in last 24 hours:   Patient Vitals for the past 24 hrs:  BP Temp Temp src Pulse Resp SpO2  12/20/15 0432 (!) 89/49 mmHg 98.4 F (36.9 C) Oral 60 16 99 %  12/20/15 0010 (!) 88/43 mmHg 98.6 F (37 C) Oral (!) 58 16 100 %  12/19/15 1949 (!) 93/48 mmHg 97.4 F (36.3 C) Oral (!) 57 16 98 %  12/19/15 1221 110/75 mmHg 97.6 F (36.4 C) - (!) 53 14 100 %  12/19/15 1200 100/64 mmHg - - (!) 48 11 100 %  12/19/15 1145 - 97.5 F (36.4 C) - (!) 47 13 100 %  12/19/15 1130 98/62 mmHg - - (!) 48 11 100 %  12/19/15 1115 100/60 mmHg - - (!) 46 10 100 %  12/19/15 1100 (!) 90/58 mmHg - - (!) 47 (!) 7 100 %  12/19/15 1045 94/62 mmHg - - (!) 50 14 100 %  12/19/15 1030 97/64 mmHg - - (!) 43 11 100 %  12/19/15 1021 100/69 mmHg 97.2 F (36.2 C) - (!) 50 12 100 %      Intake/Output from previous day:   06/22 0701 - 06/23 0700 In: 1600 [I.V.:1600] Out: 1550 [Urine:750]   Intake/Output this shift:       Intake/Output      06/22 0701 - 06/23 0700 06/23 0701 - 06/24 0700   I.V. (mL/kg) 1600 (27.1)    Total Intake(mL/kg) 1600 (27.1)    Urine (mL/kg/hr) 750 (0.5)    Blood 800 (0.6)    Total Output 1550     Net +50          Urine Occurrence 4 x       LABORATORY DATA:  Recent Labs  12/20/15 0550  WBC 5.1  HGB 8.7*  HCT 25.5*  PLT 173    Recent Labs  12/20/15 0550  NA 136  K 4.1  CL 102  CO2 29  BUN 13  CREATININE 0.78  GLUCOSE 114*  CALCIUM  8.4*   Lab Results  Component Value Date   INR 1.04 12/12/2015    Recent Radiographic Studies :  Dg Chest 2 View  12/12/2015  CLINICAL DATA:  Preoperative testing EXAM: CHEST  2 VIEW COMPARISON:  None. FINDINGS: Normal heart size and mediastinal contours. No acute infiltrate or edema. No effusion or pneumothorax. Mild pectus excavatum. No acute osseous findings. IMPRESSION: No active cardiopulmonary disease. Electronically Signed   By: Monte Fantasia M.D.   On: 12/12/2015 15:53   Dg Hip Operative Unilat W Or W/o Pelvis Left  12/19/2015  CLINICAL DATA:  Left total hip arthroplasty EXAM: OPERATIVE LEFT HIP (WITH  PELVIS IF PERFORMED) 3 VIEWS TECHNIQUE: Portable intraoperative radiographs were submitted. COMPARISON:  None. FINDINGS: Three intraoperative portable left hip radiographs demonstrate left acetabular prosthesis, which appears well positioned on these views. Surgical resection of the left femoral head. IMPRESSION: Postsurgical changes in the left hip as described. Electronically Signed   By: Ilona Sorrel M.D.   On: 12/19/2015 09:50   Dg Hip Unilat With Pelvis 2-3 Views Left  12/19/2015  CLINICAL DATA:  Increasing pain in left hip ever since left total hip surgery this morning EXAM: DG HIP (WITH OR WITHOUT PELVIS) 2-3V LEFT COMPARISON:  None. FINDINGS: Left total hip arthroplasty. No fracture or dislocation. Soft tissue emphysema in the surrounding soft tissue consistent with postsurgical changes. Degenerative changes of bilateral SI joints. IMPRESSION: Left total hip arthroplasty with postsurgical changes in the surrounding soft tissues. Electronically Signed   By: Kathreen Devoid   On: 12/19/2015 18:48     Examination:  General appearance: alert, cooperative and no distress  Wound Exam: clean, dry, intact   Drainage:  None: wound tissue dry  Motor Exam: EHL, FHL, Anterior Tibial and Posterior Tibial Intact  Sensory Exam: Superficial Peroneal, Deep Peroneal and Tibial  normal  Vascular Exam: Normal  Assessment:    1 Day Post-Op  Procedure(s) (LRB): LEFT TOTAL HIP ARTHROPLASTY (Left)  ADDITIONAL DIAGNOSIS:  Principal Problem:   Primary osteoarthritis of left hip Active Problems:   Status post total replacement of left hip  Acute Blood Loss Anemia-will monitor,BP decreased which could be related to analgesics   Plan: Physical Therapy as ordered Weight Bearing as Tolerated (WBAT)  DVT Prophylaxis:  Xarelto, Foot Pumps and TED hose  DISCHARGE PLAN: Home  DISCHARGE NEEDS: HHPT, Walker and 3-in-1 comode seat   awake, alert this am-light headed last night, but feeling better this am-will monitor progress in PT this am when OOB, voiding without difficulty     Garald Balding  12/20/2015 7:36 AM

## 2015-12-20 NOTE — Progress Notes (Addendum)
Physical Therapy Treatment Patient Details Name: [name] MRN: [id number] DOB: Oct 09, 1950 Today's Date: 12/20/2015    History of Present Illness Patient is a 65 y/o female with hx of hypothyroidism presents s/p Left THA.     PT Comments    Able to initiate stair training this afternoon, so did not ambulate as far as AM session.  Pt should continue to progress well. Recommend HHPT and RW.  Follow Up Recommendations  Home health PT;Supervision - Intermittent     Equipment Recommendations  Rolling walker with 5" wheels    Recommendations for Other Services       Precautions / Restrictions Precautions Precautions: Posterior Hip Precaution Booklet Issued: Yes (comment) Precaution Comments: Reviewed precautions. Required Braces or Orthoses: Knee Immobilizer - Left Knee Immobilizer - Left: Other (comment) (in bed) Restrictions Weight Bearing Restrictions: Yes LLE Weight Bearing: Weight bearing as tolerated    Mobility  Bed Mobility Overal bed mobility: Needs Assistance Bed Mobility: Sit to Supine       Sit to supine: Min assist   General bed mobility comments: MIN A to L LE management  Transfers Overall transfer level: Needs assistance Equipment used: Rolling walker (2 wheeled) Transfers: Sit to/from Stand Sit to Stand: Min guard         General transfer comment: cues to not bend too far forward and with turning to maintain hip precautions  Ambulation/Gait Ambulation/Gait assistance: Min guard Ambulation Distance (Feet): 75 Feet Assistive device: Rolling walker (2 wheeled) Gait Pattern/deviations: Step-to pattern;Step-through pattern;Antalgic Gait velocity: decreased   General Gait Details: Step to pattern, but able to progress to step through pattern.  cues for incorporating hip precautions into turns. One episode of L knee buckle, but able to correct.   Stairs Stairs: Yes Stairs assistance: Min guard Stair Management: One rail Right;Step to  pattern;Forwards;With cane Number of Stairs: 2 (plus 3 short ones) General stair comments: Cues for technique.  Husband present and observed session  Wheelchair Mobility    Modified Rankin (Stroke Patients Only)       Balance     Sitting balance-Leahy Scale: Good     Standing balance support: Bilateral upper extremity supported Standing balance-Leahy Scale: Poor Standing balance comment: requires UE support                    Cognition Arousal/Alertness: Awake/alert Behavior During Therapy: WFL for tasks assessed/performed Overall Cognitive Status: Within Functional Limits for tasks assessed       Memory: Decreased recall of precautions              Exercises     General Comments General comments (skin integrity, edema, etc.): husband present      Pertinent Vitals/Pain Pain Assessment: 0-10 Pain Score: 2  Pain Location: L buttocks Pain Descriptors / Indicators: Sore Pain Intervention(s): Limited activity within patient's tolerance;Monitored during session;Repositioned    Home Living                      Prior Function            PT Goals (current goals can now be found in the care plan section) Acute Rehab PT Goals Patient Stated Goal: to get back to playing tennis PT Goal Formulation: With patient Time For Goal Achievement: 01/02/16 Potential to Achieve Goals: Good Progress towards PT goals: Progressing toward goals    Frequency  7X/week    PT Plan Current plan remains appropriate    Co-evaluation  End of Session Equipment Utilized During Treatment: Gait belt Activity Tolerance: Patient tolerated treatment well Patient left: in bed;with call bell/phone within reach;with family/visitor present     Time: [id number] PT Time Calculation (min) (ACUTE ONLY): 34 min  Charges:  $Gait Training: 23-37 mins                    G Codes:      Latissa Frick LUBECK 12/20/2015, 2:56 PM

## 2015-12-20 NOTE — Care Management Note (Addendum)
Case Management Note  Patient Details  Name: YODIT SMALE MRN: [id number] Date of Birth: April 07, 1951  Subjective/Objective:     65 yr old female s/p left total hip arthroplasty.              Action/Plan: Case manager spoke with patient and husband concerning home health and DME needs. Choice was offered for Home Health agency, patient was preoperatively setup with [facility], no changes. Patient requested therapist by the name of Nicholaus Corolla, CM contacted [facility], Corliss Blacker with this request. DME has been ordered. Patient will have family support at discharge.   Expected Discharge Date:   12/21/15               Expected Discharge Plan:  [place]  In-House Referral:     Discharge planning Services  CM Consult  Post Acute Care Choice:  Durable Medical Equipment, Home Health Choice offered to:  Patient, Spouse  DME Arranged:  3-N-1, Walker rolling DME Agency:  [place]:  PT [place]:  [facility] (now [facility])  Status of Service:  Completed, signed off  If discussed at [employer] of Stay Meetings, dates discussed:    Additional Comments:  Ninfa Meeker, RN 12/20/2015, 2:14 PM

## 2015-12-20 NOTE — Progress Notes (Signed)
Physical Therapy Treatment Patient Details Name: [name] MRN: [id number] DOB: 02-05-1951 Today's Date: 12/20/2015    History of Present Illness Patient is a 65 y/o female with hx of hypothyroidism presents s/p Left THA.     PT Comments    Pt did well with ambulation today.  Some brief nausea at end of gait, so she sat down and then felt better.  Needs continued education on hip precautions and incorporating into mobility. Con't to recommend HHPT.  Follow Up Recommendations  Home health PT;Supervision - Intermittent     Equipment Recommendations  Rolling walker with 5" wheels    Recommendations for Other Services       Precautions / Restrictions Precautions Precautions: Posterior Hip Precaution Booklet Issued: No Precaution Comments: Reviewed precautions. Required Braces or Orthoses: Knee Immobilizer - Left Knee Immobilizer - Left:  (in bed) Restrictions Weight Bearing Restrictions: Yes LLE Weight Bearing: Weight bearing as tolerated    Mobility  Bed Mobility               General bed mobility comments: up in recliner upon arrival  Transfers Overall transfer level: Needs assistance Equipment used: Rolling walker (2 wheeled) Transfers: Sit to/from Stand Sit to Stand: Min guard         General transfer comment: cues for hand placement and to maintain hip precautions  Ambulation/Gait Ambulation/Gait assistance: Min guard Ambulation Distance (Feet): 110 Feet Assistive device: Rolling walker (2 wheeled) Gait Pattern/deviations: Step-to pattern;Decreased stance time - left;Antalgic Gait velocity: decreased   General Gait Details: Cues for maintaining hip precautions with turning.  Able to increase WB as gait progressed. Husband followed with recliner.   Stairs            Wheelchair Mobility    Modified Rankin (Stroke Patients Only)       Balance     Sitting balance-Leahy Scale: Good     Standing balance support: Bilateral  upper extremity supported Standing balance-Leahy Scale: Poor Standing balance comment: requries RW                    Cognition Arousal/Alertness: Awake/alert Behavior During Therapy: WFL for tasks assessed/performed Overall Cognitive Status: Within Functional Limits for tasks assessed       Memory: Decreased recall of precautions              Exercises Total Joint Exercises Ankle Circles/Pumps: Both;10 reps;Supine Quad Sets: Strengthening;Both;10 reps Gluteal Sets: Strengthening;Both;10 reps Heel Slides: AAROM;Left;10 reps Hip ABduction/ADduction: AAROM;Left;10 reps    General Comments General comments (skin integrity, edema, etc.): husband present      Pertinent Vitals/Pain Pain Assessment: 0-10 Pain Score: 5  Pain Location: butt Pain Descriptors / Indicators: Sore Pain Intervention(s): Monitored during session;Repositioned    Home Living                      Prior Function            PT Goals (current goals can now be found in the care plan section) Acute Rehab PT Goals Patient Stated Goal: to get back to playing tennis PT Goal Formulation: With patient Time For Goal Achievement: 01/02/16 Potential to Achieve Goals: Good Progress towards PT goals: Progressing toward goals    Frequency  7X/week    PT Plan Current plan remains appropriate    Co-evaluation             End of Session Equipment Utilized During Treatment: Gait belt Activity Tolerance: Patient  tolerated treatment well Patient left: in chair;with call bell/phone within reach;with nursing/sitter in room     Time: 1120-1148 PT Time Calculation (min) (ACUTE ONLY): 28 min  Charges:                       G Codes:      [name] 12/20/2015, 1:11 PM

## 2015-12-21 LAB — BASIC METABOLIC PANEL
Anion gap: 4 — ABNORMAL LOW (ref 5–15)
BUN: 7 mg/dL (ref 6–20)
CO2: 26 mmol/L (ref 22–32)
Calcium: 8.2 mg/dL — ABNORMAL LOW (ref 8.9–10.3)
Chloride: 108 mmol/L (ref 101–111)
Creatinine, Ser: 0.62 mg/dL (ref 0.44–1.00)
GFR calc Af Amer: >60 mL/min (ref >60)
GLUCOSE: 111 mg/dL — AB (ref 65–99)
POTASSIUM: 3.6 mmol/L (ref 3.5–5.1)
Sodium: 138 mmol/L (ref 135–145)

## 2015-12-21 LAB — CBC
HEMATOCRIT: 23.9 % — AB (ref 36.0–46.0)
Hemoglobin: 7.9 g/dL — ABNORMAL LOW (ref 12.0–15.0)
MCH: 29.6 pg (ref 26.0–34.0)
MCHC: 33.1 g/dL (ref 30.0–36.0)
MCV: 89.5 fL (ref 78.0–100.0)
PLATELETS: 169 10*3/uL (ref 150–400)
RBC: 2.67 MIL/uL — AB (ref 3.87–5.11)
RDW: 13 % (ref 11.5–15.5)
WBC: 6.3 10*3/uL (ref 4.0–10.5)

## 2015-12-21 NOTE — Progress Notes (Signed)
Patient ID: [name], female   DOB: [birth year], 65 y.o.   MRN: [id number] PATIENT ID: [name]        MRN:  [id number]          DOB/AGE: 1951/03/08 / 65 y.o    Lisa Fears, MD   Biagio Borg, PA-C [address] [place], [place]  [address]                             [phone]   PROGRESS NOTE  Subjective:  negative for Chest Pain  negative for Shortness of Breath  negative for Nausea/Vomiting   negative for Calf Pain    Tolerating Diet: yes         Patient reports pain as mild.     Pain controlled with hydrocodone  Objective: Vital signs in last 24 hours:   Patient Vitals for the past 24 hrs:  BP Temp Temp src Pulse Resp SpO2  12/21/15 0520 (!) 107/55 mmHg 100.3 F (37.9 C) Oral 83 18 97 %  12/20/15 2024 (!) 104/46 mmHg 98.8 F (37.1 C) Oral 80 18 100 %  12/20/15 1541 (!) 95/44 mmHg 98.6 F (37 C) Oral 65 16 100 %  12/20/15 1300 (!) 88/50 mmHg 98.8 F (37.1 C) - 66 16 95 %      Intake/Output from previous day:   06/23 0701 - 06/24 0700 In: 960 [P.O.:960] Out: -    Intake/Output this shift:       Intake/Output      06/23 0701 - 06/24 0700 06/24 0701 - 06/25 0700   P.O. 960    I.V. (mL/kg)     Total Intake(mL/kg) 960 (16.3)    Urine (mL/kg/hr)     Blood     Total Output       Net +960          Urine Occurrence 6 x       LABORATORY DATA:  Recent Labs  12/20/15 0550 12/21/15 0432  WBC 5.1 6.3  HGB 8.7* 7.9*  HCT 25.5* 23.9*  PLT 173 169    Recent Labs  12/20/15 0550 12/21/15 0432  NA 136 138  K 4.1 3.6  CL 102 108  CO2 29 26  BUN 13 7  CREATININE 0.78 0.62  GLUCOSE 114* 111*  CALCIUM 8.4* 8.2*   Lab Results  Component Value Date   INR 1.04 12/12/2015    Recent Radiographic Studies :  Dg Chest 2 View  12/12/2015  CLINICAL DATA:  Preoperative testing EXAM: CHEST  2 VIEW COMPARISON:  None. FINDINGS: Normal heart size and mediastinal contours. No acute infiltrate or edema. No effusion or pneumothorax. Mild  pectus excavatum. No acute osseous findings. IMPRESSION: No active cardiopulmonary disease. Electronically Signed   By: Monte Fantasia M.D.   On: 12/12/2015 15:53   Dg Hip Operative Unilat W Or W/o Pelvis Left  12/19/2015  CLINICAL DATA:  Left total hip arthroplasty EXAM: OPERATIVE LEFT HIP (WITH PELVIS IF PERFORMED) 3 VIEWS TECHNIQUE: Portable intraoperative radiographs were submitted. COMPARISON:  None. FINDINGS: Three intraoperative portable left hip radiographs demonstrate left acetabular prosthesis, which appears well positioned on these views. Surgical resection of the left femoral head. IMPRESSION: Postsurgical changes in the left hip as described. Electronically Signed   By: Ilona Sorrel M.D.   On: 12/19/2015 09:50   Dg Hip Unilat With Pelvis 2-3 Views Left  12/19/2015  CLINICAL DATA:  Increasing pain in  left hip ever since left total hip surgery this morning EXAM: DG HIP (WITH OR WITHOUT PELVIS) 2-3V LEFT COMPARISON:  None. FINDINGS: Left total hip arthroplasty. No fracture or dislocation. Soft tissue emphysema in the surrounding soft tissue consistent with postsurgical changes. Degenerative changes of bilateral SI joints. IMPRESSION: Left total hip arthroplasty with postsurgical changes in the surrounding soft tissues. Electronically Signed   By: Kathreen Devoid   On: 12/19/2015 18:48     Examination:  General appearance: alert, cooperative and no distress  Wound Exam: clean, dry, intact   Drainage:  None: wound tissue dry  Motor Exam: EHL, FHL, Anterior Tibial and Posterior Tibial Intact  Sensory Exam: Superficial Peroneal, Deep Peroneal and Tibial normal  Vascular Exam: Normal  Assessment:    2 Days Post-Op  Procedure(s) (LRB): LEFT TOTAL HIP ARTHROPLASTY (Left)  ADDITIONAL DIAGNOSIS:  Principal Problem:   Primary osteoarthritis of left hip Active Problems:   Status post total replacement of left hip  Acute Blood Loss Anemia-appears stable, BP now stable, not light  headed, good effort in PT   Plan: Physical Therapy as ordered Weight Bearing as Tolerated (WBAT)  DVT Prophylaxis:  Xarelto and TED hose  DISCHARGE PLAN: Home  DISCHARGE NEEDS: HHPT, Walker and 3-in-1 comode seat  Had temp to 100.3 early this am, now 98.4-no SOB or chest pain, using incentive respirometer, without calf pain or difficulty voiding, dressing changed and wound clean, WBC not elevated--will plan D/C later this am unless further problem      [name] W  12/21/2015 9:06 AM

## 2015-12-21 NOTE — Progress Notes (Signed)
Occupational Therapy Treatment Patient Details Name: [name] MRN: [id number] DOB: [birth year] Today's Date: 12/21/2015    History of present illness Patient is a 65 y/o female with hx of hypothyroidism presents s/p Left THA.    OT comments  Pt. Able to review hip precautions and energy conservation techniques during completion of ADLS.  Pt. Declined need for A/E for LB ADLS as she will have family assist at home.  Pt. Declined need for transfer practice in b.room reporting completed prior to my arrival.  Note d/c set for later today.    Follow Up Recommendations  No OT follow up    Equipment Recommendations  3 in 1 bedside comode    Recommendations for Other Services      Precautions / Restrictions Precautions Precautions: Fall;Posterior Hip Precaution Comments: Reviewed precautions. Required Braces or Orthoses: Knee Immobilizer - Left Restrictions LLE Weight Bearing: Weight bearing as tolerated       Mobility Bed Mobility                  Transfers                      Balance                                   ADL Overall ADL's : Needs assistance/impaired               Lower Body Bathing Details (indicate cue type and reason): reviewed A/E, pt. declines need as she will have family asssit at home.  reviewed donning over LLE first and ways to implement energy conservation through use of donning all items before standing up to eliminate mulitple sit/stand       Lower Body Dressing Details (indicate cue type and reason): reviewed A/E, pt. declines need as she will have family asssit at home.  reviewed donning over LLE first and ways to implement energy conservation through use of donning all items before standing up to eliminate mulitple sit/stand               General ADL Comments: pt. declined need for any return demo of toilet,tub, dressing tasks.  reviewed energy conservation with pt., and hip precautions as she  expressed concern with "popping this thing out".  answered all questions.  pt. denies need for A/E will have family assist      Vision                     Perception     Praxis      Cognition   Behavior During Therapy: WFL for tasks assessed/performed Overall Cognitive Status: Within Functional Limits for tasks assessed                       Extremity/Trunk Assessment               Exercises     Shoulder Instructions       General Comments      Pertinent Vitals/ Pain       Pain Assessment: No/denies pain  Home Living                                          Prior Functioning/Environment  Frequency Min 2X/week     Progress Toward Goals  OT Goals(current goals can now be found in the care plan section)  Progress towards OT goals: Progressing toward goals     Plan Discharge plan remains appropriate    Co-evaluation                 End of Session     Activity Tolerance Patient tolerated treatment well   Patient Left in chair;with call bell/phone within reach   Nurse Communication          Time: 1021-1030 OT Time Calculation (min): 9 min  Charges: OT General Charges $OT Visit: 1 Procedure OT Treatments $Self Care/Home Management : 8-22 mins  Janice Coffin, COTA/L 12/21/2015, 10:36 AM

## 2015-12-21 NOTE — Progress Notes (Signed)
PT Cancellation Note  Patient Details Name: [name] MRN: [id number] DOB: 03/31/1951   Cancelled Treatment:    Reason Eval/Treat Not Completed: Other (comment) (Pt d/c on arrival.  Unable to perform tx.  )   Cristela Blue 12/21/2015, 11:34 AM  Governor Rooks, PTA pager [phone]

## 2015-12-23 NOTE — Anesthesia Postprocedure Evaluation (Signed)
Anesthesia Post Note  Patient: [name]  Procedure(s) Performed: Procedure(s) (LRB): LEFT TOTAL HIP ARTHROPLASTY (Left)  Patient location during evaluation: PACU Anesthesia Type: Spinal and MAC Level of consciousness: awake and alert Pain management: pain level controlled Vital Signs Assessment: post-procedure vital signs reviewed and stable Respiratory status: spontaneous breathing and respiratory function stable Cardiovascular status: blood pressure returned to baseline and stable Postop Assessment: spinal receding Anesthetic complications: no    Last Vitals:  Filed Vitals:   12/21/15 0520 12/21/15 0908  BP: 107/55   Pulse: 83   Temp: 37.9 C 36.9 C  Resp: 18     Last Pain:  Filed Vitals:   12/21/15 0908  PainSc: 3                  Tiajuana Amass

## 2016-03-19 ENCOUNTER — Other Ambulatory Visit: Payer: Self-pay | Admitting: Physician Assistant

## 2016-03-19 DIAGNOSIS — C4491 Basal cell carcinoma of skin, unspecified: Secondary | ICD-10-CM

## 2016-03-19 HISTORY — DX: Basal cell carcinoma of skin, unspecified: C44.91

## 2016-03-30 ENCOUNTER — Other Ambulatory Visit: Payer: Self-pay | Admitting: Gynecology

## 2016-03-30 DIAGNOSIS — Z1231 Encounter for screening mammogram for malignant neoplasm of breast: Secondary | ICD-10-CM

## 2016-04-07 ENCOUNTER — Ambulatory Visit
Admission: RE | Admit: 2016-04-07 | Discharge: 2016-04-07 | Disposition: A | Discharge status: Home / Self Care | Payer: BC Managed Care – PPO | Source: Ambulatory Visit | Attending: Gynecology | Admitting: Gynecology

## 2016-04-07 DIAGNOSIS — Z1231 Encounter for screening mammogram for malignant neoplasm of breast: Secondary | ICD-10-CM

## 2016-05-25 ENCOUNTER — Encounter (INDEPENDENT_AMBULATORY_CARE_PROVIDER_SITE_OTHER): Payer: Self-pay | Admitting: Orthopaedic Surgery

## 2016-05-25 ENCOUNTER — Ambulatory Visit (INDEPENDENT_AMBULATORY_CARE_PROVIDER_SITE_OTHER): Payer: BC Managed Care – PPO | Admitting: Orthopaedic Surgery

## 2016-05-25 VITALS — BP 118/70 | Resp 14 | Ht 64.0 in | Wt 128.0 lb

## 2016-05-25 DIAGNOSIS — Z96642 Presence of left artificial hip joint: Secondary | ICD-10-CM

## 2016-05-25 NOTE — Progress Notes (Signed)
Office Visit Note   Patient: [name]           Date of Birth: Nov 09, 1950           MRN: [id number] Visit Date: 05/25/2016              Requested by: Anastasio Auerbach, MD [place], [place] [address] PCP: Anastasio Auerbach, MD   Assessment & Plan: Visit Diagnoses:  1. Status post left hip replacement     Plan: f/u 6 months.[name] is doing very well post left total hip replacement she has worked very hard and has had an excellent result I'll see her back in 6 months.  Follow-Up Instructions: No Follow-up on file.   Orders:  No orders of the defined types were placed in this encounter.  No orders of the defined types were placed in this encounter.     Procedures: No procedures performed   Clinical Data: No additional findings.   Subjective: No chief complaint on file.   Pt is Six month Post op of Left THA on 12/19/15   [name] is playing tennis on a routine basis without problem. She walks about 3 miles a day. She fell 2 weeks ago when is held little discomfort along her left thigh which was delayed rather than initial after the fall. There has been no compromise of her activities. She denies any numbness tingling or neurovascular compromise. She does have some mild bilateral knee pain that seems to be alleviated with Aleve. Review of Systems   Objective: Vital Signs: There were no vitals taken for this visit.  Physical Exam  Ortho Exam painless range of motion of both hips with internal/external rotation. The leg lengths appeared to be symmetrical. No lateral hip pain bilaterally. Straight leg raise is negative. Neurovascular exam intact. Slight limp when first getting up from a seated position which resolves after 5 or 6 steps.  Specialty Comments:  No specialty comments available.  Imaging: No results found.   PMFS History: Patient Active Problem List   Diagnosis Date Noted  . Primary  osteoarthritis of left hip 12/19/2015  . 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  . Hypothyroidism due to Hashimoto's thyroiditis 10/15/2015  . Degenerative disc disease   . Arthritis    Past Medical History:  Diagnosis Date  . Arthritis   . Cataract   . Complication of anesthesia    SPINAL  -  CONVULSIONS (PLACED IN WRONG PLACE)  . Degenerative disc disease   . Hypothyroidism   . PONV (postoperative nausea and vomiting)     Family History  Problem Relation Age of Onset  . Uterine cancer Mother   . Heart attack Mother   . Hypertension Mother   . Kidney cancer Father   . Lung cancer Father   . Diabetes Sister   . Thyroid disease Neg Hx     Past Surgical History:  Procedure Laterality Date  . CARPAL TUNNEL RELEASE     BILAT    . CATARACT EXTRACTION     BILAT   . ECTOPIC PREGNANCY SURGERY    . EYE SURGERY    . KNEE CARTILAGE SURGERY     RIGHT    (MENISCUS)  . TONSILLECTOMY    . TOTAL HIP ARTHROPLASTY Left 12/19/2015  . TOTAL HIP ARTHROPLASTY Left 12/19/2015   Procedure: LEFT TOTAL HIP ARTHROPLASTY;  Surgeon: Garald Balding, MD;  Location: [facility];  Service: Orthopedics;  Laterality:  Left;  . TRIGGER FINGER RELEASE    . TUBAL LIGATION     Social History   Occupational History  . Not on file.   Social History Main Topics  . Smoking status: Never Smoker  . Smokeless tobacco: Never Used  . Alcohol use 0.0 oz/week     Comment: Rare  . Drug use: No  . Sexual activity: Yes    Birth control/ protection: Surgical, Post-menopausal     Comment: Tubal lig-1st intercourse 65 yo-Fewer than 5 partners

## 2016-08-26 ENCOUNTER — Telehealth: Payer: Self-pay | Admitting: Internal Medicine

## 2016-08-26 NOTE — Telephone Encounter (Signed)
Pt called in to reschedule her appointment she had scheduled, but she wanted to mention that she has been having some itching lately, which is what happened when she was first diagnosed with Hashimotos, and she wants to know if this could possibly be a sign of something wrong with her thyroid and if she needs to come in sooner or what Dr. Cruzita Lederer thinks she should do.

## 2016-08-27 ENCOUNTER — Telehealth: Payer: Self-pay

## 2016-08-27 ENCOUNTER — Other Ambulatory Visit: Payer: Self-pay

## 2016-08-27 DIAGNOSIS — E063 Autoimmune thyroiditis: Secondary | ICD-10-CM

## 2016-08-27 NOTE — Telephone Encounter (Signed)
Called and LVM for patient to call back to schedule an appointment with Dr.Gherghe in 3-4 months, and also to schedule a lab appointment. Lab orders are in.

## 2016-08-27 NOTE — Telephone Encounter (Signed)
OK to check a TSH, fT4 and fT3. She does need an appt with me in next 3-4 mo, since last was 09/2015

## 2016-08-27 NOTE — Telephone Encounter (Signed)
Called patient and advised that we did need the labs first, made appointment for tomorrow, patient requested to wait for results to come back and then will make appointment with Dr.Gherghe. No other questions at this time.

## 2016-08-27 NOTE — Telephone Encounter (Signed)
Called patient and advised that we needed her to call back to schedule appointment with Dr.Gherghe in 3-4 months, and we need her to make lab appointment. Lab orders are in.

## 2016-08-27 NOTE — Telephone Encounter (Signed)
Patient stated that maybe she should get labs drawn first and see what that says before she make an appt.

## 2016-08-28 ENCOUNTER — Telehealth: Payer: Self-pay

## 2016-08-28 ENCOUNTER — Other Ambulatory Visit (INDEPENDENT_AMBULATORY_CARE_PROVIDER_SITE_OTHER): Payer: BC Managed Care – PPO

## 2016-08-28 DIAGNOSIS — E063 Autoimmune thyroiditis: Secondary | ICD-10-CM | POA: Diagnosis not present

## 2016-08-28 LAB — T3, FREE: T3 FREE: 3.1 pg/mL (ref 2.3–4.2)

## 2016-08-28 LAB — TSH: TSH: 3.13 u[IU]/mL (ref 0.35–4.50)

## 2016-08-28 LAB — T4, FREE: FREE T4: 0.95 ng/dL (ref 0.60–1.60)

## 2016-08-28 NOTE — Telephone Encounter (Signed)
-----   Message from Philemon Kingdom, MD sent at 08/28/2016  4:33 PM EST ----- TFTs are all normal. Can schedule a f/u appt within ~ 3 mo or so.

## 2016-08-28 NOTE — Telephone Encounter (Signed)
Called patient and gave lab results. Patient had no questions or concerns. Scheduled for appointment 3 months.

## 2016-10-14 ENCOUNTER — Ambulatory Visit: Payer: BC Managed Care – PPO | Admitting: Internal Medicine

## 2016-11-19 ENCOUNTER — Other Ambulatory Visit: Payer: Self-pay | Admitting: Internal Medicine

## 2016-11-23 ENCOUNTER — Ambulatory Visit (INDEPENDENT_AMBULATORY_CARE_PROVIDER_SITE_OTHER): Payer: BC Managed Care – PPO | Admitting: Orthopaedic Surgery

## 2016-11-30 ENCOUNTER — Ambulatory Visit (INDEPENDENT_AMBULATORY_CARE_PROVIDER_SITE_OTHER): Payer: BC Managed Care – PPO | Admitting: Internal Medicine

## 2016-11-30 ENCOUNTER — Encounter: Payer: Self-pay | Admitting: Internal Medicine

## 2016-11-30 VITALS — BP 110/60 | HR 66 | Wt 128.0 lb

## 2016-11-30 DIAGNOSIS — E038 Other specified hypothyroidism: Secondary | ICD-10-CM

## 2016-11-30 DIAGNOSIS — E063 Autoimmune thyroiditis: Secondary | ICD-10-CM

## 2016-11-30 NOTE — Patient Instructions (Signed)
Please come back for labs after stopping Biotin for 5 days..  Continue Levothyroxine 50 mcg daily.  Take the thyroid hormone every day, with water, >30 minutes before breakfast, separated by >4 hours from acid reflux medications, calcium, iron, multivitamins.  Please come back for a follow-up appointment in 1 year.

## 2016-11-30 NOTE — Progress Notes (Addendum)
Patient ID: [name], female   DOB: [birth year], 66 y.o.   MRN: [id number]   HPI  [name] is a 66 y.o.-year-old female, initially referred by Dr Phineas Real for management of hypothyroidism, diagnosed as secondary to Hashimoto's thyroiditis at last visit. Last visit with me  1 year and 2 mo ago.  She had THR in 11/2015.  Pt. has been dx with hypothyroidism in 10/2014; started on Levothyroxine 50 mcg. Continues this dose today.  Pt is on levothyroxine 50 mcg daily, taken: - in am - at 4 am - fasting - at least 30 min from b'fast - no Ca - + coffee with creamer in am - + Fe, MVI at night - no PPIs - + on Biotin - last dose yeatserday  I reviewed pt's thyroid tests: Lab Results  Component Value Date   TSH 3.13 08/28/2016   TSH 1.97 10/18/2015   TSH 1.14 04/15/2015   TSH 1.72 01/03/2015   TSH 5.026 (H) 11/12/2014   TSH 4.637 (H) 11/07/2014   TSH 4.208 11/01/2013   TSH 4.165 10/28/2012   TSH 5.623 (H) 10/26/2012   FREET4 0.95 08/28/2016   FREET4 1.01 10/18/2015   FREET4 1.05 04/15/2015    Her TPO antibodies were elevated, confirming Hashimoto thyroiditis: Component     Latest Ref Rng 04/15/2015  Thyroperoxidase Ab SerPl-aCnc     <9 IU/mL 151 (H)  Thyroglobulin Ab     <2 IU/mL <1   She c/o: - itching  - feet and entire body >> takes an antihistamine med 1 tab alternating with 1/2 qod. She tried to reduce the dose >> itching returned.  Pt denies: - feeling nodules in neck - hoarseness - dysphagia - choking - SOB with lying down  She has no FH of thyroid disorders. No FH of thyroid cancer. No h/o radiation tx to head or neck.  No seaweed or kelp. No recent contrast studies. No herbal supplements. No Biotin use. No recent steroids use.   She sees Dr Estanislado Pandy (rheum). She was investigated for possible autoimmune disease in the past, but she does not have a clear diagnosis.  She exercises: Walking and plays tennis almost  daily.  ROS: Constitutional: see HPI Eyes: no blurry vision, no xerophthalmia ENT: no sore throat, see HPI Cardiovascular: no CP/SOB/palpitations/leg swelling Respiratory: no cough/SOB Gastrointestinal: no N/V/D/C Musculoskeletal: no muscle/joint aches Skin: no rashes Neurological: no tremors/numbness/tingling/dizziness  I reviewed pt's medications, allergies, PMH, social hx, family hx, and changes were documented in the history of present illness. Otherwise, unchanged from my initial visit note.  Past Medical History:  Diagnosis Date  . Arthritis   . Cataract   . Complication of anesthesia    SPINAL  -  CONVULSIONS (PLACED IN WRONG PLACE)  . Degenerative disc disease   . Hypothyroidism   . PONV (postoperative nausea and vomiting)    Past Surgical History:  Procedure Laterality Date  . CARPAL TUNNEL RELEASE     BILAT    . CATARACT EXTRACTION     BILAT   . ECTOPIC PREGNANCY SURGERY    . EYE SURGERY    . KNEE CARTILAGE SURGERY     RIGHT    (MENISCUS)  . TONSILLECTOMY    . TOTAL HIP ARTHROPLASTY Left 12/19/2015  . TOTAL HIP ARTHROPLASTY Left 12/19/2015   Procedure: LEFT TOTAL HIP ARTHROPLASTY;  Surgeon: Garald Balding, MD;  Location: [facility];  Service: Orthopedics;  Laterality: Left;  . TRIGGER FINGER RELEASE    .  TUBAL LIGATION     Social History   Social History  . Marital Status: Married    Spouse Name: N/A  . Number of Children: 2   Occupational History  .  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 Smoking status: Never Smoker   . Smokeless tobacco: Not on file  . Alcohol Use: 0.0 oz/week    0 Standard drinks or equivalent per week     Comment: Rare  . Drug Use: No   Current Outpatient Prescriptions on File Prior to Visit  Medication Sig Dispense Refill  . Biotin 5000 MCG TABS Take 5,000 mcg by mouth daily.    . Calcium-Magnesium-Vitamin D 300-20-200 MG-MG-UNIT CHEW Chew 1 tablet by mouth 2 (two) times daily.     . ferrous sulfate 325 (65 FE) MG EC tablet Take 325 mg by mouth at bedtime.    Marland Kitchen HYDROcodone-acetaminophen (NORCO/VICODIN) 5-325 MG tablet Take 1-2 tablets by mouth every 4 (four) hours as needed for moderate pain or severe pain. 90 tablet 0  . levothyroxine (SYNTHROID, LEVOTHROID) 50 MCG tablet TAKE ONE TABLET EVERY MORNING 90 tablet 0  . methocarbamol (ROBAXIN) 500 MG tablet Take 1 tablet (500 mg total) by mouth every 8 (eight) hours as needed for muscle spasms. 30 tablet 0  . Multiple Vitamin (MULTIVITAMIN) tablet Take 1 tablet by mouth daily.    . rivaroxaban (XARELTO) 10 MG TABS tablet Take 1 tablet (10 mg total) by mouth daily with breakfast. 30 tablet 0   No current facility-administered medications on file prior to visit.    Allergies  Allergen Reactions  . Sulfa Antibiotics Swelling  . Motrin [Ibuprofen] Other (See Comments)    Constipation in high doses   Family History  Problem Relation Age of Onset  . Uterine cancer Mother   . Heart attack Mother   . Hypertension Mother   . Kidney cancer Father   . Lung cancer Father   . Diabetes Sister   . Thyroid disease Neg Hx    PE: BP 110/60 (BP Location: Left Arm, Patient Position: Sitting)   Pulse 66   Wt 128 lb (58.1 kg)   SpO2 98%   BMI 21.97 kg/m  Body mass index is 21.97 kg/m.  Wt Readings from Last 3 Encounters:  11/30/16 128 lb (58.1 kg)  05/25/16 128 lb (58.1 kg)  12/19/15 130 lb (59 kg)   Constitutional: normal weight, in NAD, + very tanned Eyes: PERRLA, EOMI, no exophthalmos ENT: moist mucous membranes, no thyromegaly, no cervical lymphadenopathy Cardiovascular: RRR, No MRG Respiratory: CTA B Gastrointestinal: abdomen soft, NT, ND, BS+ Musculoskeletal: no deformities, strength intact in all 4 Skin: moist, warm, no rashes Neurological: no tremor with outstretched hands, DTR normal in all 4   ASSESSMENT: 1. Hypothyroidism due to Hashimoto's thyroiditis  PLAN:  1. Patient with recent dx of  hypothyroidism, on levothyroxine therapy. She appears euthyroid, and her only complaint is itching/hived  - controlled by antihistaminics >> will see dermatology - latest thyroid labs reviewed with pt >> normal  - she continues on LT4 50 mcg daily - pt feels good on this dose. - we discussed about taking the thyroid hormone every day, with water, >30 minutes before breakfast, separated by >4 hours from acid reflux medications, calcium, iron, multivitamins. Pt. is taking it correctly. She moved it at 4 am. - will check thyroid tests in 5 days: TSH and fT4, as she needs to be off Biotin for  5 days before blood draw - If labs are abnormal, she will need to return for repeat TFTs in 1.5 months - OTW, RTC in 1 year  Needs refills for 1 year.  Component     Latest Ref Rng & Units 12/04/2016  TSH     0.35 - 4.50 uIU/mL 1.68  T4,Free(Direct)     0.60 - 1.60 ng/dL 0.87   TFTs normal.  Philemon Kingdom, MD PhD [facility] Endocrinology

## 2016-12-04 ENCOUNTER — Other Ambulatory Visit (INDEPENDENT_AMBULATORY_CARE_PROVIDER_SITE_OTHER): Payer: BC Managed Care – PPO

## 2016-12-04 DIAGNOSIS — E038 Other specified hypothyroidism: Secondary | ICD-10-CM | POA: Diagnosis not present

## 2016-12-04 DIAGNOSIS — E063 Autoimmune thyroiditis: Secondary | ICD-10-CM | POA: Diagnosis not present

## 2016-12-04 LAB — TSH: TSH: 1.68 u[IU]/mL (ref 0.35–4.50)

## 2016-12-04 LAB — T4, FREE: Free T4: 0.87 ng/dL (ref 0.60–1.60)

## 2016-12-04 MED ORDER — LEVOTHYROXINE SODIUM 50 MCG PO TABS: 50.0000 ug | ORAL_TABLET | Freq: Every morning | ORAL | 3 refills | Status: DC

## 2016-12-04 NOTE — Addendum Note (Signed)
Addended by: Philemon Kingdom on: 12/04/2016 05:53 PM   Modules accepted: Orders

## 2016-12-07 ENCOUNTER — Telehealth: Payer: Self-pay

## 2016-12-07 NOTE — Telephone Encounter (Signed)
Called patient and gave lab results. Patient had no questions or concerns.  

## 2016-12-07 NOTE — Telephone Encounter (Signed)
-----   Message from Philemon Kingdom, MD sent at 12/04/2016  5:53 PM EDT ----- [name], can you please call pt: TFTs all normal >> I refilled her LT4 Rx.

## 2016-12-14 ENCOUNTER — Encounter: Payer: Self-pay | Admitting: Gynecology

## 2016-12-14 ENCOUNTER — Ambulatory Visit (INDEPENDENT_AMBULATORY_CARE_PROVIDER_SITE_OTHER): Payer: BC Managed Care – PPO | Admitting: Gynecology

## 2016-12-14 VITALS — BP 120/76 | Ht 63.0 in | Wt 129.0 lb

## 2016-12-14 DIAGNOSIS — Z01411 Encounter for gynecological examination (general) (routine) with abnormal findings: Secondary | ICD-10-CM | POA: Diagnosis not present

## 2016-12-14 DIAGNOSIS — Z23 Encounter for immunization: Secondary | ICD-10-CM

## 2016-12-14 DIAGNOSIS — Z13220 Encounter for screening for lipoid disorders: Secondary | ICD-10-CM | POA: Diagnosis not present

## 2016-12-14 DIAGNOSIS — N952 Postmenopausal atrophic vaginitis: Secondary | ICD-10-CM | POA: Diagnosis not present

## 2016-12-14 LAB — CBC WITH DIFFERENTIAL/PLATELET
Basophils Absolute: 42 cells/uL (ref 0–200)
Basophils Relative: 1 %
Eosinophils Absolute: 126 cells/uL (ref 15–500)
Eosinophils Relative: 3 %
HEMATOCRIT: 38.2 % (ref 35.0–45.0)
Hemoglobin: 12.3 g/dL (ref 11.7–15.5)
LYMPHS PCT: 25 %
Lymphs Abs: 1050 cells/uL (ref 850–3900)
MCH: 30.1 pg (ref 27.0–33.0)
MCHC: 32.2 g/dL (ref 32.0–36.0)
MCV: 93.4 fL (ref 80.0–100.0)
MONO ABS: 378 {cells}/uL (ref 200–950)
MONOS PCT: 9 %
MPV: 10.3 fL (ref 7.5–12.5)
NEUTROS PCT: 62 %
Neutro Abs: 2604 cells/uL (ref 1500–7800)
PLATELETS: 230 10*3/uL (ref 140–400)
RBC: 4.09 MIL/uL (ref 3.80–5.10)
RDW: 12.9 % (ref 11.0–15.0)
WBC: 4.2 10*3/uL (ref 3.8–10.8)

## 2016-12-14 LAB — LIPID PANEL
CHOL/HDL RATIO: 2.3 ratio (ref <5.0)
Cholesterol: 161 mg/dL (ref <200)
HDL: 71 mg/dL (ref >50)
LDL CALC: 77 mg/dL (ref <100)
Triglycerides: 65 mg/dL (ref <150)
VLDL: 13 mg/dL (ref <30)

## 2016-12-14 LAB — COMPREHENSIVE METABOLIC PANEL
ALT: 11 U/L (ref 6–29)
AST: 18 U/L (ref 10–35)
Albumin: 3.9 g/dL (ref 3.6–5.1)
Alkaline Phosphatase: 83 U/L (ref 33–130)
BUN: 22 mg/dL (ref 7–25)
CALCIUM: 9.2 mg/dL (ref 8.6–10.4)
CO2: 31 mmol/L (ref 20–31)
Chloride: 101 mmol/L (ref 98–110)
Creat: 0.83 mg/dL (ref 0.50–0.99)
GLUCOSE: 82 mg/dL (ref 65–99)
POTASSIUM: 4.6 mmol/L (ref 3.5–5.3)
Sodium: 139 mmol/L (ref 135–146)
Total Bilirubin: 0.4 mg/dL (ref 0.2–1.2)
Total Protein: 6.5 g/dL (ref 6.1–8.1)

## 2016-12-14 NOTE — Addendum Note (Signed)
Addended by: Nelva Nay on: 12/14/2016 12:49 PM   Modules accepted: Orders

## 2016-12-14 NOTE — Progress Notes (Signed)
    [name] 25-Aug-1950 [id number]        66 y.o.  [id number] for annual exam.    Past medical history,surgical history, problem list, medications, allergies, family history and social history were all reviewed and documented as reviewed in the EPIC chart.  ROS:  Performed with pertinent positives and negatives included in the history, assessment and plan.   Additional significant findings :  None   Exam: Caryn Bee assistant Vitals:   12/14/16 1204  BP: 120/76  Weight: 129 lb (58.5 kg)  Height: 5\' 3"  (1.6 m)   Body mass index is 22.85 kg/m.  General appearance:  Normal affect, orientation and appearance. Skin: Grossly normal HEENT: Without gross lesions.  No cervical or supraclavicular adenopathy. Thyroid normal.  Lungs:  Clear without wheezing, rales or rhonchi Cardiac: RR, without RMG Abdominal:  Soft, nontender, without masses, guarding, rebound, organomegaly or hernia Breasts:  Examined lying and sitting without masses, retractions, discharge or axillary adenopathy. Pelvic:  Ext, BUS, Vagina: With atrophic changes  Cervix: With atrophic changes  Uterus: Anteverted, normal size, shape and contour, midline and mobile nontender   Adnexa: Without masses or tenderness    Anus and perineum: Normal   Rectovaginal: Normal sphincter tone without palpated masses or tenderness.    Assessment/Plan:  66 y.o. [id number] female for annual exam.   1. Postmenopausal/atrophic genital changes. No significant hot flushes, night sweats, vaginal dryness or any vaginal bleeding. Continue to monitor report any issues or bleeding. 2. Mammography 03/2016. Continue with annual mammography this coming fall. SBE monthly reviewed. Breast exam normal today. 3. Pap smear 2016. No Pap smear done today. Plan repeat Pap smear next year at 3 year interval per current screening guidelines. No history of significant abnormal Pap smears. 4. DEXA 2013 normal. Plan repeat DEXA next year at age 66.  Vitamin D 50 two years ago and not repeated now. 5. Colonoscopy 2010. Reported repeat interval 10 years. 6. Health maintenance. Requests baseline labs. CBC, CMP, lipid profile ordered. Has thyroid follow-through her endocrinologist. Follow up in one year, sooner as needed.   Anastasio Auerbach MD, 12:37 PM 12/14/2016

## 2016-12-14 NOTE — Patient Instructions (Signed)
Follow up in one year, sooner as needed. 

## 2016-12-31 ENCOUNTER — Telehealth (INDEPENDENT_AMBULATORY_CARE_PROVIDER_SITE_OTHER): Payer: Self-pay | Admitting: Orthopaedic Surgery

## 2016-12-31 NOTE — Telephone Encounter (Signed)
Patient left a message in regards to her Voltaren Gel prescription.  She stated that she had 3 refills until October.  She has not needed the prescription until now.  She is wondering if Dr. Durward Fortes can activate the refills instead of going through the insurance again.  CB#[phone].  Thank you.

## 2016-12-31 NOTE — Telephone Encounter (Signed)
Patient left a message stating that we would have to call the insurance's prior authorization number which is [id number].  Patient's CB#[phone].  Thank you

## 2016-12-31 NOTE — Telephone Encounter (Signed)
Patient has contacted Pharmacy, and insurance company. Per both Dr. Has to call for prior Authorization. BCBs prior authorization dept. [id number]. This is for an exception to cover for patient. Also, patient request you call Helen with prior authorization so rx can be filled.

## 2016-12-31 NOTE — Telephone Encounter (Signed)
Patient requesting a refill on Voltaren Gel 1%. Patient never had to get refills on last rx, and now rx has expired. Patient is going out of town tomorrow late, and would like to get RX if possible before then. Patient uses [facility] in [place]. Please call patient when rx is called in.

## 2016-12-31 NOTE — Telephone Encounter (Signed)
Call the pharmacy

## 2016-12-31 NOTE — Telephone Encounter (Signed)
I'm not sure this can be done

## 2016-12-31 NOTE — Telephone Encounter (Signed)
Called pt, meds expired

## 2017-01-01 ENCOUNTER — Other Ambulatory Visit (INDEPENDENT_AMBULATORY_CARE_PROVIDER_SITE_OTHER): Payer: Self-pay

## 2017-01-01 MED ORDER — DICLOFENAC SODIUM 1 % TD GEL: 2.0000 g | Freq: Four times a day (QID) | TRANSDERMAL | 3 refills | Status: DC

## 2017-01-01 NOTE — Telephone Encounter (Signed)
Sent rx.

## 2017-01-20 ENCOUNTER — Other Ambulatory Visit: Payer: Self-pay | Admitting: Physician Assistant

## 2017-03-26 ENCOUNTER — Encounter: Payer: Self-pay | Admitting: Gynecology

## 2017-03-26 ENCOUNTER — Ambulatory Visit (INDEPENDENT_AMBULATORY_CARE_PROVIDER_SITE_OTHER): Payer: BC Managed Care – PPO | Admitting: Gynecology

## 2017-03-26 VITALS — BP 112/66

## 2017-03-26 DIAGNOSIS — N9089 Other specified noninflammatory disorders of vulva and perineum: Secondary | ICD-10-CM | POA: Diagnosis not present

## 2017-03-26 DIAGNOSIS — N750 Cyst of Bartholin's gland: Secondary | ICD-10-CM

## 2017-03-26 NOTE — Patient Instructions (Signed)
Follow up for biopsy appointment as scheduled

## 2017-03-26 NOTE — Progress Notes (Signed)
    [name] 19-Jul-1950 [id number]        66 y.o.  [id number] presents having noticed a small amount of blood staining on her underwear several weeks ago after playing tennis. When she looked she noticed a swelling at her vaginal opening that was draining. Also developed some ecchymoses around it which is now resolved but she still has a small raised area that she wanted me to look at. Was never tender. No other symptoms.  Past medical history,surgical history, problem list, medications, allergies, family history and social history were all reviewed and documented in the EPIC chart.  Directed ROS with pertinent positives and negatives documented in the history of present illness/assessment and plan.  Exam: Caryn Bee assistant Vitals:   03/26/17 1418  BP: 112/66   General appearance:  Normal Abdomen soft nontender without masses guarding rebound Pelvic external BUS vagina with atrophic changes. Small right Bartholin cyst noted. Nontender. Small pigmented raised fleshy area left of posterior fourchette as diagrammed. Cervix atrophic. Uterus normal size midline mobile nontender. Adnexa without masses or tenderness. Physical Exam  Genitourinary:        Assessment/Plan:  66 y.o. [id number] with small pigmented raised fleshy area left of posterior fourchette. Discussed differential to include VIN. Recommended colposcopy appointment for better inspection and biopsy/excision. Patient agrees follow up for this appointment. I also reviewed her right classic appearing Bartholin's cyst. Never noticed this on prior exams. Patient has never noticed this herself. Options for management reviewed with her and recommended observation for now with self exams and as long as this remains unchanged/asymptomatic them will follow. If would enlarge or become symptomatic them will represent to discuss marsupialization/excision.    Anastasio Auerbach MD, 2:33 PM 03/26/2017

## 2017-04-08 ENCOUNTER — Ambulatory Visit: Payer: BC Managed Care – PPO | Admitting: Gynecology

## 2017-05-03 ENCOUNTER — Ambulatory Visit: Payer: BC Managed Care – PPO | Admitting: Gynecology

## 2017-05-03 ENCOUNTER — Encounter: Payer: Self-pay | Admitting: Gynecology

## 2017-05-03 VITALS — BP 116/74

## 2017-05-03 DIAGNOSIS — N750 Cyst of Bartholin's gland: Secondary | ICD-10-CM

## 2017-05-03 DIAGNOSIS — N9089 Other specified noninflammatory disorders of vulva and perineum: Secondary | ICD-10-CM | POA: Diagnosis not present

## 2017-05-03 NOTE — Progress Notes (Signed)
    [name] 01/10/1951 [id number]        66 y.o.  [id number] presents for colposcopy and vulvar biopsy.  Recently had an episode of where she had staining on her underwear.  She noticed some swelling and drainage in the vulvar region.  Also noted some ecchymoses.  Exam 03/26/2017 showed right small Bartholin cyst and a raised pigmented area left upper perineal body.  Patient has had no bleeding or other issues since.  Past medical history,surgical history, problem list, medications, allergies, family history and social history were all reviewed and documented in the EPIC chart.  Directed ROS with pertinent positives and negatives documented in the history of present illness/assessment and plan.  Exam: Caryn Bee assistant Vitals:   05/03/17 1524  BP: 116/74   General appearance:  Normal External BUS vagina with 2 cm classic right Bartholin cyst.  Subtle raised pigmented area left upper perineal body. Physical Exam  Genitourinary:        Colposcopy performed after acetic acid cleanse with no concerning changes of the vulva.  The raised mildly pigmented area left upper outer perineal body remained unchanged after acetic acid cleansing.  Procedure: A representative area of this pigmented change was cleansed with Betadine, infiltrated with 1% lidocaine and a biopsy was taken.  Silver nitrate hemostasis applied.  Patient tolerated well.  Assessment/Plan:  66 y.o. [id number] with right:  1. Right Bartholin cyst not appreciated previously.  Not bothersome to the patient.  Patient is going to monitor him as long as remains unchanged will follow.  If it enlarges or becomes symptomatic she will represent for evaluation. 2. Subtle raised pigmented area perineal body.  Questionable anatomic variant versus pathology such as VIN.  Representative biopsy taken.  Patient will follow-up for results.    Anastasio Auerbach MD, 3:51 PM 05/03/2017

## 2017-05-03 NOTE — Patient Instructions (Signed)
Office will call you with biopsy results 

## 2017-05-05 LAB — TISSUE SPECIMEN

## 2017-05-05 LAB — PATHOLOGY

## 2017-06-23 ENCOUNTER — Other Ambulatory Visit: Payer: Self-pay | Admitting: Gynecology

## 2017-06-23 DIAGNOSIS — Z1231 Encounter for screening mammogram for malignant neoplasm of breast: Secondary | ICD-10-CM

## 2017-06-28 ENCOUNTER — Ambulatory Visit
Admission: RE | Admit: 2017-06-28 | Discharge: 2017-06-28 | Disposition: A | Discharge status: Home / Self Care | Payer: BC Managed Care – PPO | Source: Ambulatory Visit | Attending: Gynecology | Admitting: Gynecology

## 2017-06-28 DIAGNOSIS — Z1231 Encounter for screening mammogram for malignant neoplasm of breast: Secondary | ICD-10-CM

## 2017-11-19 ENCOUNTER — Other Ambulatory Visit: Payer: Self-pay | Admitting: Internal Medicine

## 2017-11-29 ENCOUNTER — Encounter: Payer: Self-pay | Admitting: Internal Medicine

## 2017-11-29 ENCOUNTER — Ambulatory Visit: Payer: BC Managed Care – PPO | Admitting: Internal Medicine

## 2017-11-29 VITALS — BP 98/60 | HR 58 | Ht 63.0 in | Wt 137.6 lb

## 2017-11-29 DIAGNOSIS — E063 Autoimmune thyroiditis: Secondary | ICD-10-CM | POA: Diagnosis not present

## 2017-11-29 DIAGNOSIS — E038 Other specified hypothyroidism: Secondary | ICD-10-CM | POA: Diagnosis not present

## 2017-11-29 NOTE — Patient Instructions (Addendum)
Please come back for labs after stopping Biotin for >10 day.  Continue Levothyroxine 50 mcg daily.  Take the thyroid hormone every day, with water, >30 minutes before breakfast, separated by >4 hours from acid reflux medications, calcium, iron, multivitamins.  Please come back for a follow-up appointment in 1 year.

## 2017-11-29 NOTE — Progress Notes (Addendum)
Patient ID: [name], female   DOB: Nov 18, 1950, 67 y.o.   MRN: [id number]   HPI  [name] is a 67 y.o.-year-old female, initially referred by Dr Phineas Real for management of hypothyroidism, diagnosed as secondary to Hashimoto's thyroiditis at last visit. Last visit a year ago.  Pt. has been dx with hypothyroidism in 10/2014 and started on levothyroxine 50 mcg daily.   We did not have to change the dose since.  Pt takes the levothyroxine: - at 3-5:30 am - fasting - Coffee and creamer in the morning, at least 30 minutes from levothyroxine - at least 30 min from b'fast - Takes iron in MVIs at night (9-10 pm) - + calcium >4h later - no PPIs - + Biotin (?5000 mcg daily) - last dose yesterday  Reviewed her TFTs: Lab Results  Component Value Date   TSH 1.68 12/04/2016   TSH 3.13 08/28/2016   TSH 1.97 10/18/2015   TSH 1.14 04/15/2015   TSH 1.72 01/03/2015   TSH 5.026 (H) 11/12/2014   TSH 4.637 (H) 11/07/2014   TSH 4.208 11/01/2013   TSH 4.165 10/28/2012   TSH 5.623 (H) 10/26/2012   FREET4 0.87 12/04/2016   FREET4 0.95 08/28/2016   FREET4 1.01 10/18/2015   FREET4 1.05 04/15/2015    TPO antibodies were elevated, confirming Hashimoto's thyroiditis: Component     Latest Ref Rng 04/15/2015  Thyroperoxidase Ab SerPl-aCnc     <9 IU/mL 151 (H)  Thyroglobulin Ab     <2 IU/mL <1   At last visit, she had intense itching and she continues on anti-histamines. She sees dermatology.This is resolved now.  Pt denies: - feeling nodules in neck - hoarseness - dysphagia - choking - SOB with lying down  She has no FH of thyroid disorders. No FH of thyroid cancer. No h/o radiation tx to head or neck.  No seaweed or kelp. No recent contrast studies. No herbal supplements. No Biotin use. No recent steroids use.    She sees Dr Estanislado Pandy (rheum). She was investigated for possible autoimmune disease in the past, but she does not have a clear diagnosis.  She is walking and  plays tennis almost daily.  She played in a tournament this weekend: 4 games in 2 days.  She had THR in 11/2015.  ROS: Constitutional: no weight gain/no weight loss, no fatigue, fine hot flashes, no subjective hypothermia Eyes: no blurry vision, no xerophthalmia ENT: no sore throat, + see HPI Cardiovascular: no CP/no SOB/no palpitations/no leg swelling Respiratory: no cough/no SOB/no wheezing Gastrointestinal: no N/no V/no D/no C/no acid reflux Musculoskeletal: + muscle aches/no joint aches Skin: no rashes, no hair loss Neurological: no tremors/no numbness/no tingling/no dizziness  I reviewed pt's medications, allergies, PMH, social hx, family hx, and changes were documented in the history of present illness. Otherwise, unchanged from my initial visit note.  Past Medical History:  Diagnosis Date  . Arthritis   . Cataract   . Complication of anesthesia    SPINAL  -  CONVULSIONS (PLACED IN WRONG PLACE)  . Degenerative disc disease   . Hypothyroidism   . PONV (postoperative nausea and vomiting)    Past Surgical History:  Procedure Laterality Date  . CARPAL TUNNEL RELEASE     BILAT    . CATARACT EXTRACTION     BILAT   . ECTOPIC PREGNANCY SURGERY    . EYE SURGERY    . KNEE CARTILAGE SURGERY     RIGHT    (MENISCUS)  . TONSILLECTOMY    .  TOTAL HIP ARTHROPLASTY Left 12/19/2015  . TOTAL HIP ARTHROPLASTY Left 12/19/2015   Procedure: LEFT TOTAL HIP ARTHROPLASTY;  Surgeon: Garald Balding, MD;  Location: [place];  Service: Orthopedics;  Laterality: Left;  . TRIGGER FINGER RELEASE    . TUBAL LIGATION     Social History   Social History  . Marital Status: Married    Spouse Name: N/A  . Number of Children: 2   Occupational History  .  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 Smoking status: Never Smoker   . Smokeless tobacco: Not on file  . Alcohol Use: 0.0 oz/week    0 Standard drinks or equivalent per week     Comment: Rare  .  Drug Use: No   Current Outpatient Medications on File Prior to Visit  Medication Sig Dispense Refill  . Biotin 5000 MCG TABS Take 5,000 mcg by mouth daily.    . Calcium Carb-Cholecalciferol (CALCIUM 1000 + D PO) Take by mouth.    . diclofenac sodium (VOLTAREN) 1 % GEL Apply 2 g topically 4 (four) times daily. 1 Tube 3  . ferrous sulfate 325 (65 FE) MG EC tablet Take 325 mg by mouth at bedtime.    . Glucosamine-Chondroit-Vit C-Mn (GLUCOSAMINE CHONDR 1500 COMPLX PO) Take by mouth.    . levothyroxine (SYNTHROID, LEVOTHROID) 50 MCG tablet TAKE ONE TABLET BY MOUTH EVERY MORNING 90 tablet 0  . Multiple Vitamin (MULTIVITAMIN) tablet Take 1 tablet by mouth daily.    . Triprolidine-Pseudoephedrine (ANTIHISTAMINE PO) Take by mouth.     No current facility-administered medications on file prior to visit.    Allergies  Allergen Reactions  . Sulfa Antibiotics Swelling  . Motrin [Ibuprofen] Other (See Comments)    Constipation in high doses   Family History  Problem Relation Age of Onset  . Uterine cancer Mother   . Heart attack Mother   . Hypertension Mother   . Kidney cancer Father   . Lung cancer Father   . Diabetes Sister   . Thyroid disease Neg Hx    PE: BP 98/60   Pulse (!) 58   Ht 5\' 3"  (1.6 m)   Wt 137 lb 9.6 oz (62.4 kg)   SpO2 97%   BMI 24.37 kg/m  Body mass index is 24.37 kg/m.  Wt Readings from Last 3 Encounters:  11/29/17 137 lb 9.6 oz (62.4 kg)  12/14/16 129 lb (58.5 kg)  11/30/16 128 lb (58.1 kg)   Constitutional: Normal weight, in NAD Eyes: PERRLA, EOMI, no exophthalmos ENT: moist mucous membranes, + slight thyromegaly - symmetrical, no cervical lymphadenopathy Cardiovascular: RRR, No MRG Respiratory: CTA B Gastrointestinal: abdomen soft, NT, ND, BS+ Musculoskeletal: no deformities, strength intact in all 4 Skin: moist, warm, no rashes Neurological: no tremor with outstretched hands, DTR normal in all 4   ASSESSMENT: 1. Hypothyroidism due to Hashimoto's  thyroiditis  PLAN:  1. Patient with relatively recent diagnosis of hypothyroidism, found to be related to Hashimoto's thyroiditis, currently on levothyroxine therapy. - latest thyroid labs reviewed with pt >> normal a year ago: Lab Results  Component Value Date   TSH 1.68 12/04/2016  - she continues on LT4 50 mcg daily - pt feels good on this dose  and has no complaints today - we discussed about taking the thyroid hormone every day, with water, >30 minutes before breakfast, separated by >4 hours from acid reflux medications, calcium, iron, multivitamins. Pt. is taking it correctly. -  will check thyroid tests in approximately 10 days after she stops biotin: TSH and fT4 - If labs are abnormal, she will need to return for repeat TFTs in 1.5 months -  RTC in 1 year  Needs refills for 1 year.  Orders Placed This Encounter  Procedures  . TSH    Standing Status:   Future    Standing Expiration Date:   11/30/2018  . T4, free    Standing Status:   Future    Standing Expiration Date:   11/30/2018    - time spent with the patient: 15 minutes, of which >50% was spent in obtaining information about her symptoms, reviewing her previous labs, evaluations, and treatments, counseling her about her condition, and developing a plan to further investigate and treat it.  Lab Results  Component Value Date   TSH 1.59 12/13/2017   FREET4 0.90 12/13/2017   Labs are normal.  Philemon Kingdom, MD PhD [facility]

## 2017-12-13 ENCOUNTER — Other Ambulatory Visit (INDEPENDENT_AMBULATORY_CARE_PROVIDER_SITE_OTHER): Payer: Medicare Other

## 2017-12-13 DIAGNOSIS — E063 Autoimmune thyroiditis: Secondary | ICD-10-CM

## 2017-12-13 DIAGNOSIS — E038 Other specified hypothyroidism: Secondary | ICD-10-CM | POA: Diagnosis not present

## 2017-12-13 LAB — T4, FREE: FREE T4: 0.9 ng/dL (ref 0.60–1.60)

## 2017-12-13 LAB — TSH: TSH: 1.59 u[IU]/mL (ref 0.35–4.50)

## 2017-12-13 MED ORDER — LEVOTHYROXINE SODIUM 50 MCG PO TABS: 50.0000 ug | ORAL_TABLET | Freq: Every morning | ORAL | 3 refills | Status: DC

## 2017-12-13 NOTE — Addendum Note (Signed)
Addended by: Philemon Kingdom on: 12/13/2017 05:17 PM   Modules accepted: Orders

## 2018-01-13 ENCOUNTER — Telehealth (INDEPENDENT_AMBULATORY_CARE_PROVIDER_SITE_OTHER): Payer: Self-pay | Admitting: Radiology

## 2018-01-13 NOTE — Telephone Encounter (Signed)
Diclofenac sodium gel   Request Reference Number: [id number]. DICLOFENAC GEL 1% is approved through 06/28/2018. For further questions, call [phone].

## 2018-03-22 ENCOUNTER — Other Ambulatory Visit: Payer: Self-pay | Admitting: Physician Assistant

## 2018-03-22 DIAGNOSIS — C4492 Squamous cell carcinoma of skin, unspecified: Secondary | ICD-10-CM

## 2018-03-22 HISTORY — DX: Squamous cell carcinoma of skin, unspecified: C44.92

## 2018-04-19 ENCOUNTER — Encounter (INDEPENDENT_AMBULATORY_CARE_PROVIDER_SITE_OTHER): Payer: Self-pay | Admitting: Orthopedic Surgery

## 2018-04-19 ENCOUNTER — Encounter (INDEPENDENT_AMBULATORY_CARE_PROVIDER_SITE_OTHER): Payer: Self-pay

## 2018-04-19 ENCOUNTER — Ambulatory Visit (INDEPENDENT_AMBULATORY_CARE_PROVIDER_SITE_OTHER): Payer: Self-pay

## 2018-04-19 ENCOUNTER — Ambulatory Visit (INDEPENDENT_AMBULATORY_CARE_PROVIDER_SITE_OTHER): Payer: Medicare Other | Admitting: Orthopedic Surgery

## 2018-04-19 VITALS — BP 107/79 | HR 68 | Ht 63.0 in | Wt 137.0 lb

## 2018-04-19 DIAGNOSIS — M722 Plantar fascial fibromatosis: Secondary | ICD-10-CM | POA: Diagnosis not present

## 2018-04-19 DIAGNOSIS — G8929 Other chronic pain: Secondary | ICD-10-CM | POA: Diagnosis not present

## 2018-04-19 DIAGNOSIS — M79672 Pain in left foot: Secondary | ICD-10-CM

## 2018-04-19 MED ORDER — METHYLPREDNISOLONE ACETATE 40 MG/ML IJ SUSP
40.0000 mg | INTRAMUSCULAR | Status: AC | PRN
  Administered 2018-04-19: 40 mg

## 2018-04-19 MED ORDER — LIDOCAINE HCL 1 % IJ SOLN
1.0000 mL | INTRAMUSCULAR | Status: AC | PRN
  Administered 2018-04-19: 1 mL

## 2018-04-19 MED ORDER — BUPIVACAINE HCL 0.5 % IJ SOLN
1.0000 mL | INTRAMUSCULAR | Status: AC | PRN
  Administered 2018-04-19: 1 mL

## 2018-04-19 MED ORDER — DICLOFENAC SODIUM 1 % TD GEL: 2.0000 g | Freq: Four times a day (QID) | TRANSDERMAL | 3 refills | Status: AC

## 2018-04-19 NOTE — Progress Notes (Signed)
Office Visit Note   Patient: [name]           Date of Birth: 08-03-1950           MRN: [id number] Visit Date: 04/19/2018              Requested by: Anastasio Auerbach, MD [address] [place], [place] [address] PCP: Anastasio Auerbach, MD   Assessment & Plan: Visit Diagnoses:  1. Chronic heel pain, left   2. Pain of left heel   3. Plantar fasciitis of left foot     Plan:  #1: Corticosteroid injection to the left plantar calcaneal area was accomplished atraumatically with excellent results. #2: I have printed out papers of the power step Proteh classic plus and the sorbithane heels which she can get off of the Internet. #3: Also discussed proper shoeing.  Follow-Up Instructions: Return if symptoms worsen or fail to improve.   Face-to-face time spent with patient was greater than 30 minutes.  Greater than 50% of the time was spent in counseling and coordination of care as well as treatment options..   Orders:  Orders Placed This Encounter  Procedures  . XR Os Calcis Left   Meds ordered this encounter  Medications  . diclofenac sodium (VOLTAREN) 1 % GEL    Sig: Apply 2-4 g topically 4 (four) times daily.    Dispense:  3 Tube    Refill:  3    Order Specific Question:   Supervising Provider    Answer:   Garald Balding [[id number]]      Procedures: Foot Inj Date/Time: 04/19/2018 3:25 PM Performed by: Cherylann Ratel, PA-C Authorized by: Cherylann Ratel, PA-C   Consent Given by:  Patient Site marked: the procedure site was marked   Timeout: prior to procedure the correct patient, procedure, and site was verified   Indications:  Fasciitis Condition: Plantar Fasciitis   Location: left plantar fascia muscle   Prep: patient was prepped and draped in usual sterile fashion   Needle Size:  25 G Approach:  Dorsal and lateral Medications:  1 mL bupivacaine 0.5 %; 1 mL lidocaine 1 %; 40 mg methylPREDNISolone acetate 40 MG/ML Patient  Tolerance:  Patient tolerated the procedure well with no immediate complications     Clinical Data: No additional findings.   Subjective: Chief Complaint  Patient presents with  . Left Foot - Pain  . Foot Pain    left heel pain, medial portion. 1 month. no injury.  standing, walking, playing tennis long periods of time. advil. uses ice after tennis, hurts when using ice. massage helps and rest. no radiating pain, no numbness.     HPI  67 year old white female seen today for evaluation of her left foot.  She is had a 1 month history without any injury or trauma to the left foot that of pain on the plantar surface of the heel.  She apparently has been not only playing tennis but also has been working on concrete.  Standing, walking, playing tennis has all been worsening her symptoms.  It actually hurts though when she uses the ice.  Massage has been helpful and so is the rest.  However her symptoms are worsening and she is going to be going to the beach this week and would like to consider evaluation for her heel pain.  Review of Systems  Constitutional: Negative for activity change.  HENT: Negative for congestion.   Eyes: Negative for visual  disturbance.  Respiratory: Negative for chest tightness.   Cardiovascular: Negative for leg swelling.  Gastrointestinal: Negative for nausea.  Endocrine: Negative for heat intolerance.  Genitourinary: Negative for pelvic pain.  Musculoskeletal: Positive for gait problem.  Skin: Negative for rash.  Allergic/Immunologic: Negative for food allergies.  Neurological: Negative for numbness.  Hematological: Does not bruise/bleed easily.  Psychiatric/Behavioral: Negative for sleep disturbance.     Objective: Vital Signs: BP 107/79 (BP Location: Right Arm, Patient Position: Sitting, Cuff Size: Normal)   Pulse 68   Physical Exam  Constitutional: She is oriented to person, place, and time. She appears well-developed and well-nourished.  HENT:    Mouth/Throat: Oropharynx is clear and moist.  Eyes: Pupils are equal, round, and reactive to light. EOM are normal.  Pulmonary/Chest: Effort normal.  Neurological: She is alert and oriented to person, place, and time.  Skin: Skin is warm and dry.  Psychiatric: She has a normal mood and affect. Her behavior is normal.    Ortho Exam  Exam today reveals tender to palpation over the insertion of the plantar fascia at the plantar calcaneus.  She does not have a positive squeeze test of the calcaneus.  No posterior Achilles insertion pain.  Good motion of the foot and ankle.  Neurovascular intact distally.  Specialty Comments:  No specialty comments available.  Imaging: Xr Os Calcis Left  Result Date: 04/19/2018 2 view x-ray of the left heel reveals a large plantar calcaneal spur.  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  Some calcification at the insertion of the Achilles at the third of the calcaneus.    PMFS History: Current Outpatient Medications  Medication Sig Dispense Refill  . Biotin 5000 MCG TABS Take 5,000 mcg by mouth daily.    . Calcium Carb-Cholecalciferol (CALCIUM 1000 + D PO) Take by mouth.    . diclofenac sodium (VOLTAREN) 1 % GEL Apply 2 g topically 4 (four) times daily. 1 Tube 3  . ferrous sulfate 325 (65 FE) MG EC tablet Take 325 mg by mouth at bedtime.    . Glucosamine-Chondroit-Vit C-Mn (GLUCOSAMINE CHONDR 1500 COMPLX PO) Take by mouth.    . levothyroxine (SYNTHROID, LEVOTHROID) 50 MCG tablet Take 1 tablet (50 mcg total) by mouth every morning. 90 tablet 3  . Multiple Vitamin (MULTIVITAMIN) tablet Take 1 tablet by mouth daily.    . Triprolidine-Pseudoephedrine (ANTIHISTAMINE PO) Take by mouth.    . diclofenac sodium (VOLTAREN) 1 % GEL Apply 2-4 g topically 4 (four) times daily. 3 Tube 3   No current facility-administered medications for this visit.     Patient Active Problem List   Diagnosis Date Noted  . Primary osteoarthritis of left hip 12/19/2015  . Status  post total replacement of left hip 12/19/2015  . Hypothyroidism due to Hashimoto's thyroiditis 10/15/2015  . Degenerative disc disease   . Arthritis    Past Medical History:  Diagnosis Date  . Arthritis   . Cataract   . Complication of anesthesia    SPINAL  -  CONVULSIONS (PLACED IN WRONG PLACE)  . Degenerative disc disease   . Hypothyroidism   . PONV (postoperative nausea and vomiting)     Family History  Problem Relation Age of Onset  . Uterine cancer Mother   . Heart attack Mother   . Hypertension Mother   . Kidney cancer Father   . Lung cancer Father   . Diabetes Sister   . Thyroid disease Neg Hx     Past Surgical History:  Procedure  Laterality Date  . CARPAL TUNNEL RELEASE     BILAT    . CATARACT EXTRACTION     BILAT   . ECTOPIC PREGNANCY SURGERY    . EYE SURGERY    . KNEE CARTILAGE SURGERY     RIGHT    (MENISCUS)  . TONSILLECTOMY    . TOTAL HIP ARTHROPLASTY Left 12/19/2015  . TOTAL HIP ARTHROPLASTY Left 12/19/2015   Procedure: LEFT TOTAL HIP ARTHROPLASTY;  Surgeon: Garald Balding, MD;  Location: [place];  Service: Orthopedics;  Laterality: Left;  . TRIGGER FINGER RELEASE    . TUBAL LIGATION     Social History   Occupational History  . Not on file  Tobacco Use  . Smoking status: Never Smoker  . Smokeless tobacco: Never Used  Substance and Sexual Activity  . Alcohol use: Yes    Alcohol/week: 0.0 standard drinks    Comment: Rare  . Drug use: No  . Sexual activity: Yes    Birth control/protection: Surgical, Post-menopausal    Comment: Tubal lig-1st intercourse 67 yo-Fewer than 5 partners

## 2018-06-02 IMAGING — MG 2D DIGITAL SCREENING BILATERAL MAMMOGRAM WITH CAD AND ADJUNCT TO
9 of 13 series · 9 of 29 positions shown · non-contrast
Comparison: Previous exam(s).

CLINICAL DATA: Screening.

EXAM:
2D DIGITAL SCREENING BILATERAL MAMMOGRAM WITH CAD AND ADJUNCT TOMO

[L MLO (1 of 2)]
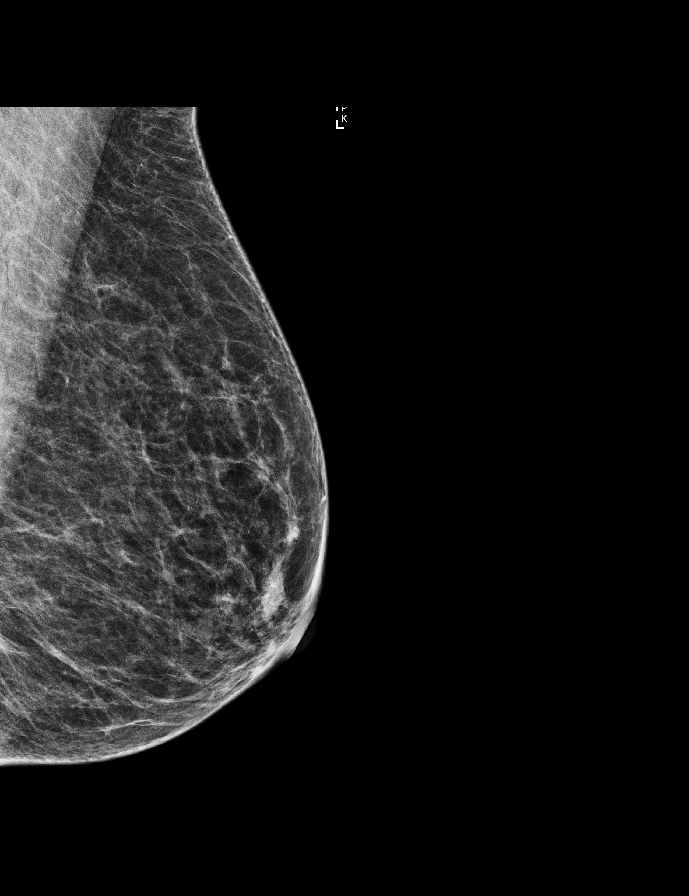

[L MLO synth-2D]
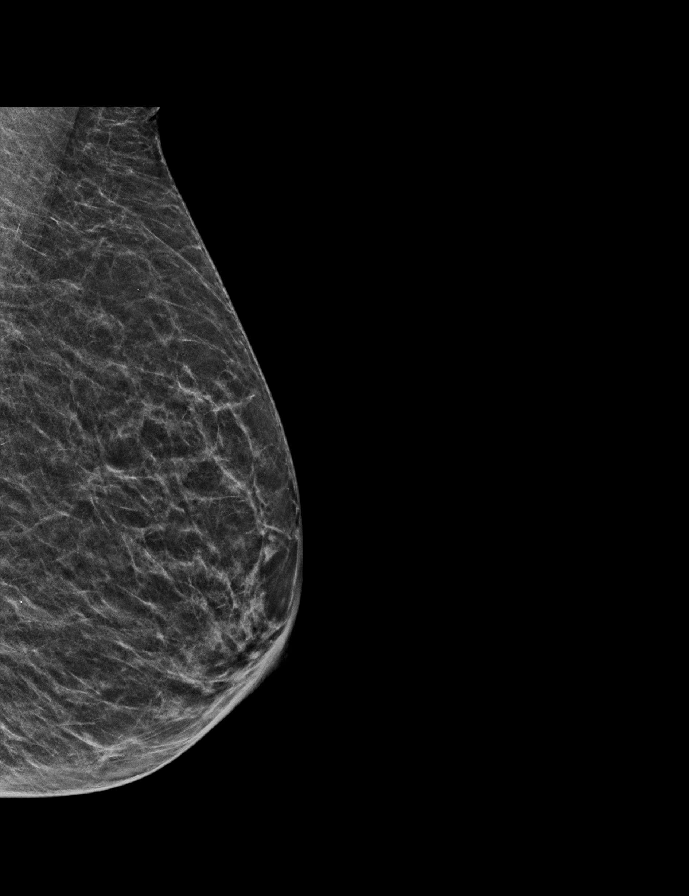

[R MLO synth-2D]
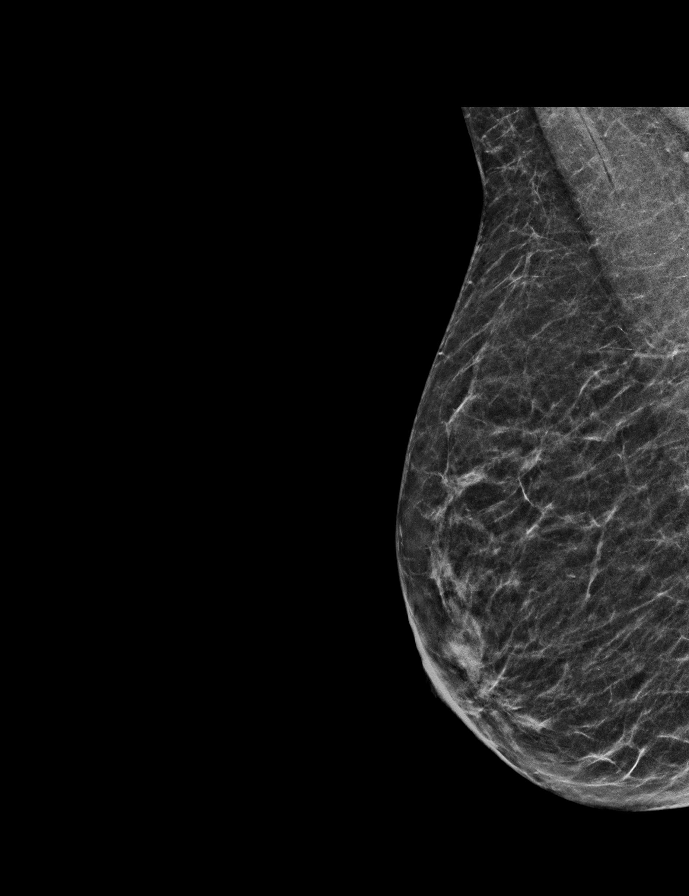

[R MLO]
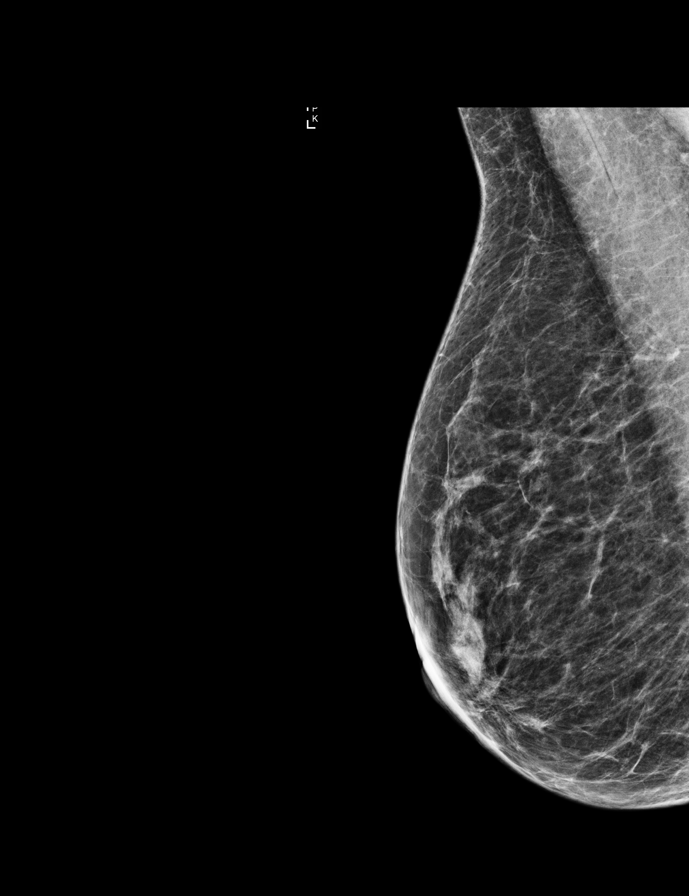

[R CC synth-2D]
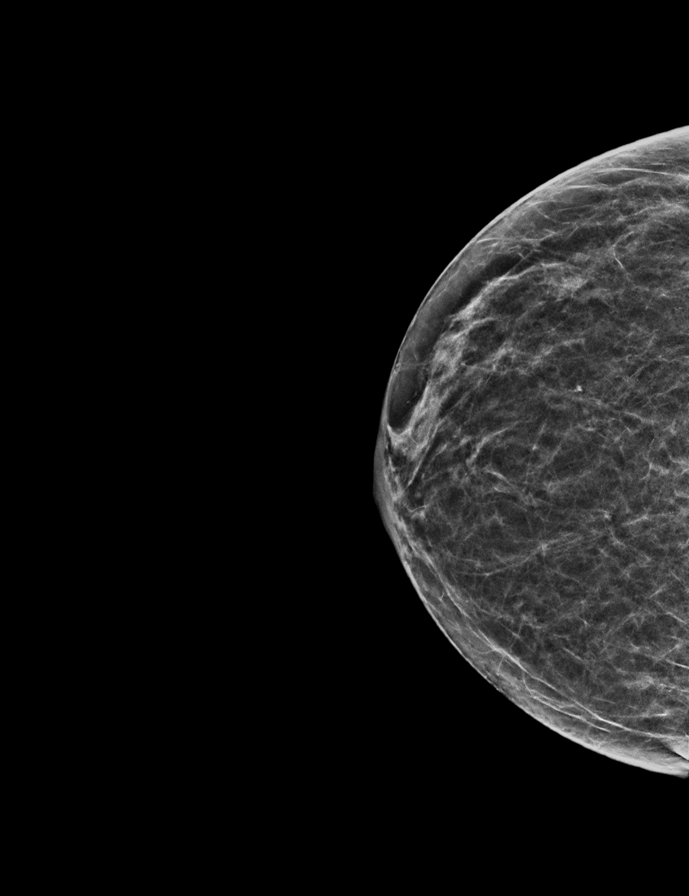

[L MLO (2 of 2)]
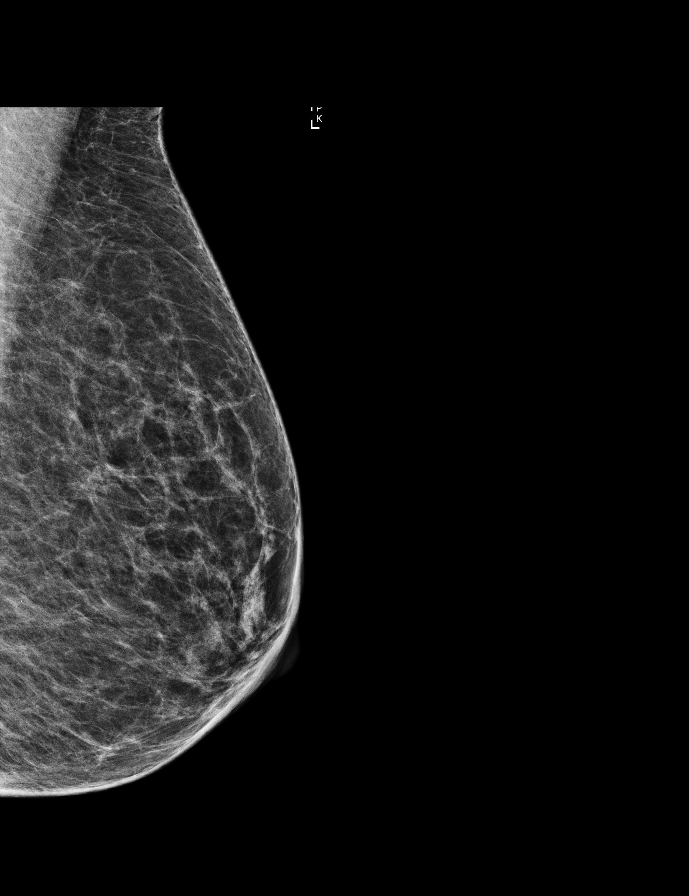

[R CC]
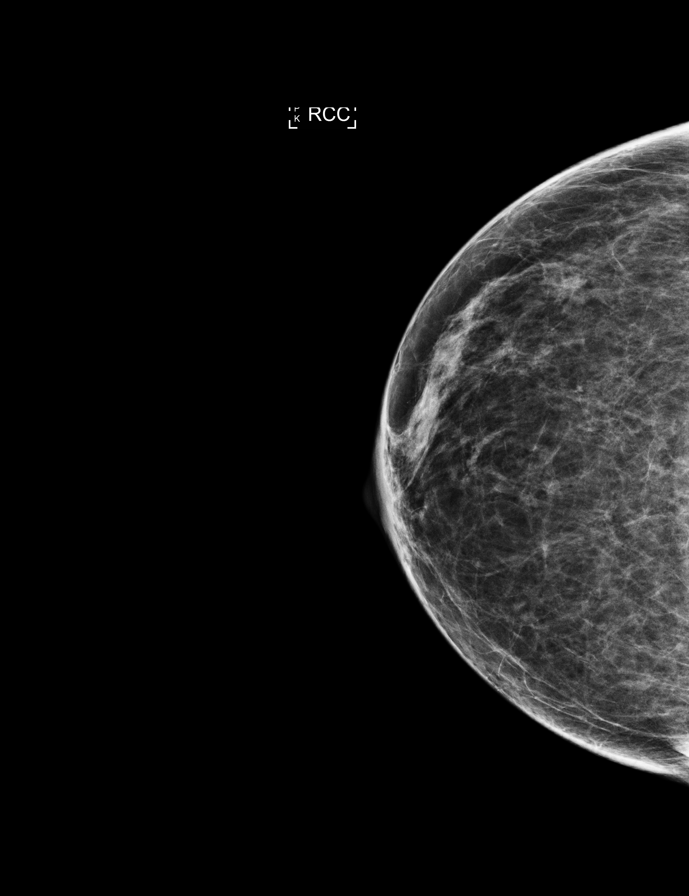

[L CC]
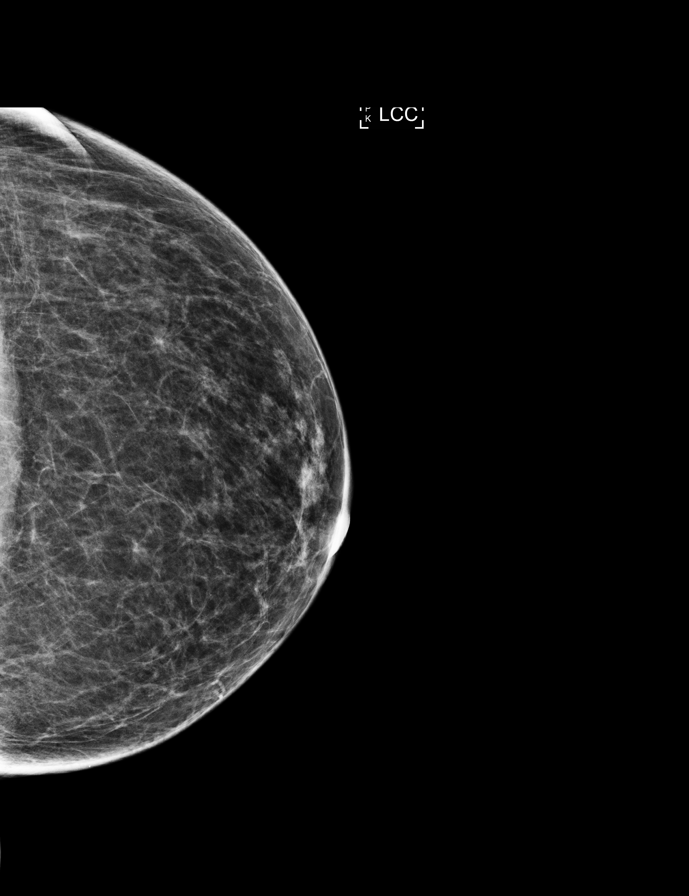

[L CC synth-2D]
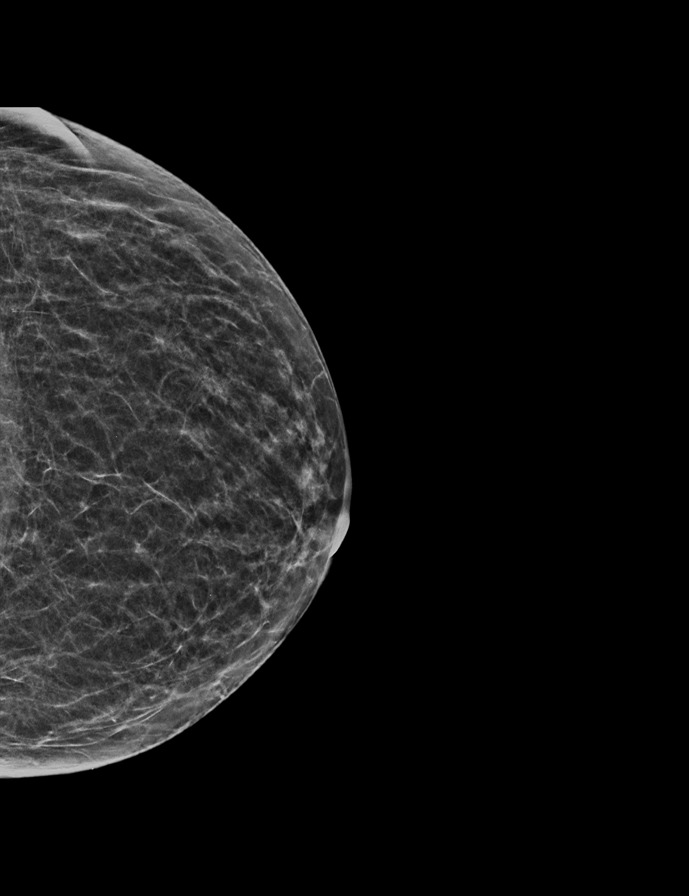

[9 of 29 positions shown; findings below may reference images not displayed]

ACR Breast Density Category b: There are scattered areas of
fibroglandular density.
FINDINGS: There are no findings suspicious for malignancy. Images were
processed with CAD.
IMPRESSION: No mammographic evidence of malignancy. A result letter of this
screening mammogram will be mailed directly to the patient.

RECOMMENDATION:
Screening mammogram in one year. (Code:97-6-RS4)

BI-RADS CATEGORY  1: Negative.

## 2018-11-28 ENCOUNTER — Encounter: Payer: Self-pay | Admitting: Internal Medicine

## 2018-11-28 ENCOUNTER — Other Ambulatory Visit: Payer: Self-pay

## 2018-11-28 ENCOUNTER — Ambulatory Visit: Payer: Medicare Other | Admitting: Internal Medicine

## 2018-11-28 VITALS — BP 108/60 | HR 65 | Ht 63.0 in | Wt 140.0 lb

## 2018-11-28 DIAGNOSIS — E038 Other specified hypothyroidism: Secondary | ICD-10-CM

## 2018-11-28 DIAGNOSIS — E063 Autoimmune thyroiditis: Secondary | ICD-10-CM

## 2018-11-28 DIAGNOSIS — N951 Menopausal and female climacteric states: Secondary | ICD-10-CM

## 2018-11-28 LAB — T4, FREE: Free T4: 0.88 ng/dL (ref 0.60–1.60)

## 2018-11-28 LAB — TSH: TSH: 2.32 u[IU]/mL (ref 0.35–4.50)

## 2018-11-28 NOTE — Progress Notes (Signed)
Patient ID: [name], female   DOB: Nov 02, 1950, 68 y.o.   MRN: [id number]  HPI  [name] is a 68 y.o.-year-old female, initially referred by Dr Phineas Real for management of hypothyroidism, diagnosed as secondary to Hashimoto's thyroiditis, returning for follow-up. Last visit 11 months ago.  Pt. has been dx with hypothyroidism 10/2014 and started on levothyroxine 50 mcg daily.  We did not have to change the dose since.  Pt takes her levothyroxine 50 mcg daily, taken: - in am - fasting - She drinks coffee and creamer in the morning, at least 30 minutes from levothyroxine - at least 30 min from b'fast - + Fe, MVI at night - no PPIs - + Calcium at least 4 hours later - + Biotin - not in last 2 weeks  Reviewed her TFTs: Lab Results  Component Value Date   TSH 1.59 12/13/2017   TSH 1.68 12/04/2016   TSH 3.13 08/28/2016   TSH 1.97 10/18/2015   TSH 1.14 04/15/2015   TSH 1.72 01/03/2015   TSH 5.026 (H) 11/12/2014   TSH 4.637 (H) 11/07/2014   TSH 4.208 11/01/2013   TSH 4.165 10/28/2012   FREET4 0.90 12/13/2017   FREET4 0.87 12/04/2016   FREET4 0.95 08/28/2016   FREET4 1.01 10/18/2015   FREET4 1.05 04/15/2015    TPO antibodies were elevated, confirming Hashimoto's thyroiditis: Component     Latest Ref Rng 04/15/2015  Thyroperoxidase Ab SerPl-aCnc     <9 IU/mL 151 (H)  Thyroglobulin Ab     <2 IU/mL <1   Pt denies: - feeling nodules in neck - hoarseness - dysphagia - choking - SOB with lying down  No FH of thyroid cancer. No h/o radiation tx to head or neck.  No seaweed or kelp. No recent contrast studies. No herbal supplements. No recent steroids use.   She sees Dr Estanislado Pandy (rheum).  She was investigated for possible autoimmune disease in the past, but she does not have a clear diagnosis.  She is walking and plays tennis.  She played in a tournament this weekend: 4 games in 2 days.  She had THR in 11/2015.  ROS: Constitutional: no weight gain/no  weight loss, no fatigue, + hot flushes -since 06/2018, no subjective hypothermia Eyes: no blurry vision, no xerophthalmia ENT: no sore throat, + see HPI Cardiovascular: no CP/no SOB/no palpitations/no leg swelling Respiratory: no cough/no SOB/no wheezing Gastrointestinal: no N/no V/no D/no C/no acid reflux Musculoskeletal: no muscle aches/no joint aches Skin: no rashes, no hair loss Neurological: no tremors/no numbness/no tingling/no dizziness  I reviewed pt's medications, allergies, PMH, social hx, family hx, and changes were documented in the history of present illness. Otherwise, unchanged from my initial visit note.  Past Medical History:  Diagnosis Date  . Arthritis   . Cataract   . Complication of anesthesia    SPINAL  -  CONVULSIONS (PLACED IN WRONG PLACE)  . Degenerative disc disease   . Hypothyroidism   . PONV (postoperative nausea and vomiting)    Past Surgical History:  Procedure Laterality Date  . CARPAL TUNNEL RELEASE     BILAT    . CATARACT EXTRACTION     BILAT   . ECTOPIC PREGNANCY SURGERY    . EYE SURGERY    . KNEE CARTILAGE SURGERY     RIGHT    (MENISCUS)  . TONSILLECTOMY    . TOTAL HIP ARTHROPLASTY Left 12/19/2015  . TOTAL HIP ARTHROPLASTY Left 12/19/2015   Procedure: LEFT TOTAL HIP ARTHROPLASTY;  Surgeon: Garald Balding, MD;  Location: [place];  Service: Orthopedics;  Laterality: Left;  . TRIGGER FINGER RELEASE    . TUBAL LIGATION     Social History   Social History  . Marital Status: Married    Spouse Name: N/A  . Number of Children: 2   Occupational History  .  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 Smoking status: Never Smoker   . Smokeless tobacco: Not on file  . Alcohol Use: 0.0 oz/week    0 Standard drinks or equivalent per week     Comment: Rare  . Drug Use: No   Current Outpatient Medications on File Prior to Visit  Medication Sig Dispense Refill  . Biotin 5000 MCG TABS Take 5,000 mcg by  mouth daily.    . Calcium Carb-Cholecalciferol (CALCIUM 1000 + D PO) Take by mouth.    . diclofenac sodium (VOLTAREN) 1 % GEL Apply 2-4 g topically 4 (four) times daily. 3 Tube 3  . ferrous sulfate 325 (65 FE) MG EC tablet Take 325 mg by mouth at bedtime.    . Glucosamine-Chondroit-Vit C-Mn (GLUCOSAMINE CHONDR 1500 COMPLX PO) Take by mouth.    . levothyroxine (SYNTHROID, LEVOTHROID) 50 MCG tablet Take 1 tablet (50 mcg total) by mouth every morning. 90 tablet 3  . Multiple Vitamin (MULTIVITAMIN) tablet Take 1 tablet by mouth daily.    . Triprolidine-Pseudoephedrine (ANTIHISTAMINE PO) Take by mouth.     No current facility-administered medications on file prior to visit.    Allergies  Allergen Reactions  . Sulfa Antibiotics Swelling  . Motrin [Ibuprofen] Other (See Comments)    Constipation in high doses   Family History  Problem Relation Age of Onset  . Uterine cancer Mother   . Heart attack Mother   . Hypertension Mother   . Kidney cancer Father   . Lung cancer Father   . Diabetes Sister   . Thyroid disease Neg Hx    PE: BP 108/60   Pulse 65   Ht 5\' 3"  (1.6 m)   Wt 140 lb (63.5 kg)   SpO2 97%   BMI 24.80 kg/m  Body mass index is 24.8 kg/m.  Wt Readings from Last 3 Encounters:  11/28/18 140 lb (63.5 kg)  04/19/18 137 lb (62.1 kg)  11/29/17 137 lb 9.6 oz (62.4 kg)   Constitutional: normal weight, in NAD Eyes: PERRLA, EOMI, no exophthalmos ENT: moist mucous membranes, + slight symmetric thyromegaly, no cervical lymphadenopathy Cardiovascular: RRR, No MRG Respiratory: CTA B Gastrointestinal: abdomen soft, NT, ND, BS+ Musculoskeletal: no deformities, strength intact in all 4 Skin: moist, warm, no rashes Neurological: no tremor with outstretched hands, DTR normal in all 4   ASSESSMENT: 1. Hypothyroidism due to Hashimoto's thyroiditis  2. Hot flushes  PLAN:  1. Patient with relatively recent diagnosis of hypothyroidism, related to Hashimoto thyroiditis, currently  on levothyroxine therapy. - latest thyroid labs reviewed with pt >> normal 11/2017 - she continues on LT4 50 mcg daily - pt feels good on this dose. - we discussed about taking the thyroid hormone every day, with water, >30 minutes before breakfast, separated by >4 hours from acid reflux medications, calcium, iron, multivitamins. Pt. is taking it correctly. - will check thyroid tests today: TSH and fT4.  - If labs are abnormal, she will need to return for repeat TFTs in 1.5 months - RTC in 1 year  2. Hot flushes - new, restarted after she  initially had these after menopausal transition - discussed that we need to make sure she is not overreplaced with LT4 - we will check today - suggested Relizen, ceiling fan at night or even a portable AC units  She needs refills for a year.   Philemon Kingdom, MD PhD [facility] Endocrin[facility]

## 2018-11-28 NOTE — Patient Instructions (Addendum)
You can try Relizen for hot flushes.  Continue Levothyroxine 50 mcg daily.  Take the thyroid hormone every day, with water, >30 minutes before breakfast, separated by >4 hours from acid reflux medications, calcium, iron, multivitamins.  Please come back for a follow-up appointment in 1 year.

## 2018-11-29 ENCOUNTER — Telehealth: Payer: Self-pay

## 2018-11-29 NOTE — Telephone Encounter (Signed)
Notified via identified VM.

## 2018-11-29 NOTE — Telephone Encounter (Signed)
-----   Message from Philemon Kingdom, MD sent at 11/28/2018  4:52 PM EDT ----- Lenna Sciara, can you please call pt: TFTs are normal.

## 2019-02-20 ENCOUNTER — Other Ambulatory Visit: Payer: Self-pay | Admitting: Internal Medicine

## 2019-04-05 ENCOUNTER — Encounter: Payer: Self-pay | Admitting: Gynecology

## 2019-07-21 ENCOUNTER — Ambulatory Visit: Payer: Medicare PPO | Attending: Internal Medicine

## 2019-07-21 DIAGNOSIS — Z23 Encounter for immunization: Secondary | ICD-10-CM

## 2019-07-21 NOTE — Progress Notes (Signed)
   Covid-19 Vaccination Clinic  Name:  [name]    MRN: [id number] DOB: 1951/03/15  07/21/2019  [name] was observed post Covid-19 immunization for 15 minutes without incidence. She was provided with Vaccine Information Sheet and instruction to access the V-Safe system.   [name] was instructed to call 911 with any severe reactions post vaccine: Marland Kitchen Difficulty breathing  . Swelling of your face and throat  . A fast heartbeat  . A bad rash all over your body  . Dizziness and weakness    Immunizations Administered    Name Date Dose VIS Date Route   Pfizer COVID-19 Vaccine 07/21/2019  5:43 PM 0.3 mL 06/09/2019 Intramuscular   Manufacturer: [place]   Lot: [id number]   [place]: [id number]

## 2019-08-08 ENCOUNTER — Ambulatory Visit: Payer: Medicare PPO | Attending: Internal Medicine

## 2019-08-08 DIAGNOSIS — Z23 Encounter for immunization: Secondary | ICD-10-CM | POA: Insufficient documentation

## 2019-08-08 NOTE — Progress Notes (Signed)
   Covid-19 Vaccination Clinic  Name:  [name]    MRN: [id number] DOB: 03-22-1951  08/08/2019  [name] was observed post Covid-19 immunization for 15 minutes without incidence. She was provided with Vaccine Information Sheet and instruction to access the V-Safe system.   [name] was instructed to call 911 with any severe reactions post vaccine: Marland Kitchen Difficulty breathing  . Swelling of your face and throat  . A fast heartbeat  . A bad rash all over your body  . Dizziness and weakness    Immunizations Administered    Name Date Dose VIS Date Route   Pfizer COVID-19 Vaccine 08/08/2019  2:39 PM 0.3 mL 06/09/2019 Intramuscular   Manufacturer: [place]   Lot: [id number]   [place]: [id number]

## 2019-11-30 ENCOUNTER — Other Ambulatory Visit: Payer: Self-pay

## 2019-12-04 ENCOUNTER — Ambulatory Visit: Payer: Medicare PPO | Admitting: Internal Medicine

## 2019-12-04 ENCOUNTER — Encounter: Payer: Self-pay | Admitting: Internal Medicine

## 2019-12-04 VITALS — BP 110/60 | HR 68 | Ht 63.0 in | Wt 147.0 lb

## 2019-12-04 DIAGNOSIS — E038 Other specified hypothyroidism: Secondary | ICD-10-CM

## 2019-12-04 DIAGNOSIS — Z862 Personal history of diseases of the blood and blood-forming organs and certain disorders involving the immune mechanism: Secondary | ICD-10-CM

## 2019-12-04 DIAGNOSIS — E063 Autoimmune thyroiditis: Secondary | ICD-10-CM | POA: Diagnosis not present

## 2019-12-04 NOTE — Patient Instructions (Signed)
Please continue Levothyroxine 50 mcg daily.  Take the thyroid hormone every day, with water, at least 30 minutes before breakfast, separated by at least 4 hours from: - acid reflux medications - calcium - iron - multivitamins  Please move iron and multivitamins earlier in the day.  Please come back for labs in 4-5 weeks.  Please come back for a follow-up appointment in 1 year.

## 2019-12-04 NOTE — Progress Notes (Signed)
Patient ID: [name], female   DOB: Jul 21, 1950, 69 y.o.   MRN: [id number]  This visit occurred during the SARS-CoV-2 public health emergency.  Safety protocols were in place, including screening questions prior to the visit, additional usage of staff PPE, and extensive cleaning of exam room while observing appropriate contact time as indicated for disinfecting solutions.   HPI  [name] is a 69 y.o.-year-old female, initially referred by Dr Phineas Real for management of hypothyroidism, diagnosed as secondary to Hashimoto's thyroiditis, returning for follow-up.  Last visit 1 year ago.  Patient was diagnosed with hypothyroidism in 10/2014 and started on levothyroxine 50 mcg daily.  We did not have to change the dose since then.  She takes her levothyroxine: - in am (~4 am) - fasting - at least 30 min from b'fast and coffee + creamer - no + Ca >4h after LT4 - + Fe, + MVI at 10-11 pm - no PPIs - + Biotin - not for last week  Reviewed her TFTs: Lab Results  Component Value Date   TSH 2.32 11/28/2018   TSH 1.59 12/13/2017   TSH 1.68 12/04/2016   TSH 3.13 08/28/2016   TSH 1.97 10/18/2015   TSH 1.14 04/15/2015   TSH 1.72 01/03/2015   TSH 5.026 (H) 11/12/2014   TSH 4.637 (H) 11/07/2014   TSH 4.208 11/01/2013   FREET4 0.88 11/28/2018   FREET4 0.90 12/13/2017   FREET4 0.87 12/04/2016   FREET4 0.95 08/28/2016   FREET4 1.01 10/18/2015   FREET4 1.05 04/15/2015    Patient's TPO antibodies were elevated, confirming Hashimoto's thyroiditis Component     Latest Ref Rng 04/15/2015  Thyroperoxidase Ab SerPl-aCnc     <9 IU/mL 151 (H)  Thyroglobulin Ab     <2 IU/mL <1   Pt denies: - feeling nodules in neck - hoarseness - dysphagia - choking - SOB with lying down  No FH of thyroid cancer. No h/o radiation tx to head or neck.  No seaweed or kelp. No recent contrast studies. No herbal supplements. No Biotin use. No recent steroids use.   She sees Dr Estanislado Pandy  (rheum).  She was investigated for possible autoimmune disease in the past, but she does not have a clear diagnosis.  She is walking and still plays tennis.  She played in a tournament: 4 games in 2 days.  She had THR in 11/2015.  ROS: Constitutional: no weight gain/no weight loss, + fatigue, + hot flashes, no subjective hypothermia, + insomnia Eyes: no blurry vision, no xerophthalmia ENT: no sore throat, + see HPI Cardiovascular: no CP/no SOB/no palpitations/no leg swelling Respiratory: no cough/no SOB/no wheezing Gastrointestinal: no N/no V/no D/no C/no acid reflux Musculoskeletal: no muscle aches/no joint aches Skin: no rashes, no hair loss Neurological: no tremors/no numbness/no tingling/no dizziness  I reviewed pt's medications, allergies, PMH, social hx, family hx, and changes were documented in the history of present illness. Otherwise, unchanged from my initial visit note.   Past Medical History:  Diagnosis Date  . Arthritis   . Cataract   . Complication of anesthesia    SPINAL  -  CONVULSIONS (PLACED IN WRONG PLACE)  . Degenerative disc disease   . Hypothyroidism   . 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 PONV (postoperative nausea and vomiting)   . 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 CARPAL TUNNEL RELEASE  BILAT    . CATARACT EXTRACTION     BILAT   . ECTOPIC PREGNANCY SURGERY    . EYE SURGERY    . KNEE CARTILAGE SURGERY     RIGHT    (MENISCUS)  . TONSILLECTOMY    . TOTAL HIP ARTHROPLASTY Left 12/19/2015  . TOTAL HIP ARTHROPLASTY Left 12/19/2015   Procedure: LEFT TOTAL HIP ARTHROPLASTY;  Surgeon: Garald Balding, MD;  Location: [facility];  Service: Orthopedics;  Laterality: Left;  . TRIGGER FINGER RELEASE    . TUBAL LIGATION     Social History   Social History  . Marital Status: Married    Spouse Name: N/A  . Number  of Children: 2   Occupational History  .  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 Smoking status: Never Smoker   . Smokeless tobacco: Not on file  . Alcohol Use: 0.0 oz/week    0 Standard drinks or equivalent per week     Comment: Rare  . Drug Use: No   Current Outpatient Medications on File Prior to Visit  Medication Sig Dispense Refill  . Biotin 5000 MCG TABS Take 5,000 mcg by mouth daily.    . Calcium Carb-Cholecalciferol (CALCIUM 1000 + D PO) Take by mouth.    . diclofenac sodium (VOLTAREN) 1 % GEL Apply 2-4 g topically 4 (four) times daily. 3 Tube 3  . ferrous sulfate 325 (65 FE) MG EC tablet Take 325 mg by mouth at bedtime.    . Glucosamine-Chondroit-Vit C-Mn (GLUCOSAMINE CHONDR 1500 COMPLX PO) Take by mouth.    . levothyroxine (SYNTHROID) 50 MCG tablet TAKE ONE (1) TABLET BY MOUTH EACH DAY INTHE MORNING 90 tablet 3  . Multiple Vitamin (MULTIVITAMIN) tablet Take 1 tablet by mouth daily.    . Triprolidine-Pseudoephedrine (ANTIHISTAMINE PO) Take by mouth.     No current facility-administered medications on file prior to visit.   Allergies  Allergen Reactions  . Sulfa Antibiotics Swelling  . Motrin [Ibuprofen] Other (See Comments)    Constipation in high doses   Family History  Problem Relation Age of Onset  . Uterine cancer Mother   . Heart attack Mother   . Hypertension Mother   . Kidney cancer Father   . Lung cancer Father   . Diabetes Sister   . Thyroid disease Neg Hx    PE: BP 110/60   Pulse 68   Ht 5\' 3"  (1.6 m)   Wt 147 lb (66.7 kg)   SpO2 98%   BMI 26.04 kg/m  Body mass index is 26.04 kg/m.  Wt Readings from Last 3 Encounters:  12/04/19 147 lb (66.7 kg)  11/28/18 140 lb (63.5 kg)  04/19/18 137 lb (62.1 kg)   Constitutional: normal weight, in NAD Eyes: PERRLA, EOMI, no exophthalmos ENT: moist mucous membranes, + slight symmetric thyromegaly, no cervical lymphadenopathy Cardiovascular: RRR, No  MRG Respiratory: CTA B Gastrointestinal: abdomen soft, NT, ND, BS+ Musculoskeletal: no deformities, strength intact in all 4 Skin: moist, warm, no rashes Neurological: no tremor with outstretched hands, DTR normal in all 4  ASSESSMENT: 1. Hypothyroidism due to Hashimoto's thyroiditis  2.  History of iron deficiency anemia  PLAN:  1. Patient with history of autoimmune hypothyroidism, on levothyroxine therapy. - latest thyroid labs reviewed with pt >> normal: Lab Results  Component Value Date   TSH 2.32 11/28/2018   - she continues on LT4 50 mcg daily - pt feels good on this dose.  She does have wt gain (7 lbs) - we discussed about taking the thyroid hormone every day, with water, >30 minutes before breakfast, separated by >4 hours from acid reflux medications, calcium, iron, multivitamins. Pt. is taking it correctly, except, she is taking levothyroxine too close to iron, calcium, and multivitamins.  We discussed about moving this earlier, around dinnertime, rather than at bedtime and recheck the tests afterwards - will check thyroid tests in 4 to 5 weeks: TSH and fT4 -  I will see her back in a year  2.  History of iron deficiency anemia -Patient tells me that she had this in the past and she has been on iron off and on all her life -We discussed about how to take iron correctly (she is now taking it with a meal, and I advised her that this is better taken before meals) and also the ideal regimen is to take it every other day, rather than every day. -However, we did discuss that she needs to check with her PCP to see if she still needs to take iron now that she is postmenopausal -She is taking a multivitamin for women above 50 and I explained that this does not have iron -I also advised her that if she continues to take calcium, she needs to take this separate from the iron  Orders Placed This Encounter  Procedures  . T4, free  . TSH   Philemon Kingdom, MD PhD [facility]  Endocrinology

## 2019-12-15 ENCOUNTER — Other Ambulatory Visit: Payer: Self-pay

## 2019-12-15 ENCOUNTER — Encounter: Payer: Self-pay | Admitting: Physician Assistant

## 2019-12-15 ENCOUNTER — Ambulatory Visit: Payer: Medicare PPO | Admitting: Physician Assistant

## 2019-12-15 DIAGNOSIS — Z1283 Encounter for screening for malignant neoplasm of skin: Secondary | ICD-10-CM

## 2019-12-15 DIAGNOSIS — L570 Actinic keratosis: Secondary | ICD-10-CM

## 2019-12-15 DIAGNOSIS — D225 Melanocytic nevi of trunk: Secondary | ICD-10-CM | POA: Diagnosis not present

## 2019-12-15 DIAGNOSIS — D489 Neoplasm of uncertain behavior, unspecified: Secondary | ICD-10-CM

## 2019-12-15 DIAGNOSIS — D485 Neoplasm of uncertain behavior of skin: Secondary | ICD-10-CM

## 2019-12-15 DIAGNOSIS — Z85828 Personal history of other malignant neoplasm of skin: Secondary | ICD-10-CM | POA: Diagnosis not present

## 2019-12-15 DIAGNOSIS — D0462 Carcinoma in situ of skin of left upper limb, including shoulder: Secondary | ICD-10-CM

## 2019-12-15 NOTE — Patient Instructions (Signed)

## 2019-12-15 NOTE — Progress Notes (Addendum)
Follow-Up Visit   Subjective  [name] is a 69 y.o. female who presents for the following: Annual Exam (No concerns).   The following portions of the chart were reviewed this encounter and updated as appropriate: Tobacco  Allergies  Meds  Problems  Med Hx  Surg Hx  Fam Hx      Objective  Well appearing patient in no apparent distress; mood and affect are within normal limits.  A full examination was performed including scalp, head, eyes, ears, nose, lips, neck, chest, axillae, abdomen, back, buttocks, bilateral upper extremities, bilateral lower extremities, hands, feet, fingers, toes, fingernails, and toenails. All findings within normal limits unless otherwise noted below.  Objective  Left Malar Cheek: Scars clear  Objective  Left Dorsal Hand: Scars clear  Objective  Head - to toe: No atypical nevi  Objective  Left 3rd Finger Metacarpophalangeal Joint, Right Hand - Posterior (7): Erythematous patches with gritty scale.  Objective  Left Hand - Posterior: Hyperkeratotic scale with pink base Volcano growth on pink base  Txpbx-ED&C Total size-0.6     Objective  Right Axilla superior: Brown crust  Objective  Right Axilla inferior: Brown crust  Objective  Left axillae: Brown crust  Assessment & Plan  History of basal cell carcinoma (BCC) Left Malar Cheek  observe  History of SCC (squamous cell carcinoma) of skin Left Dorsal Hand  observe  Screening exam for skin cancer Head - to toe  Skin exams yearly  AK (actinic keratosis) (8) Right Hand - Posterior (7); Left 3rd Finger Metacarpophalangeal Joint  Destruction of lesion - Left 3rd Finger Metacarpophalangeal Joint, Right Hand - Posterior Complexity: simple   Destruction method: cryotherapy   Informed consent: discussed and consent obtained   Timeout:  patient name, date of birth, surgical site, and procedure verified Lesion destroyed using liquid nitrogen: Yes   Cryotherapy  cycles:  1 Outcome: patient tolerated procedure well with no complications   Post-procedure details: wound care instructions given    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 Left Hand - Posterior  Skin / nail biopsy Type of biopsy: tangential   Informed consent: discussed and consent obtained   Timeout: patient name, date of birth, surgical site, and procedure verified   Anesthesia: the lesion was anesthetized in a standard fashion   Anesthetic:  1% lidocaine w/ epinephrine 1-100,000 local infiltration Instrument used: flexible razor blade   Hemostasis achieved with: aluminum chloride and electrodesiccation   Outcome: patient tolerated procedure well   Post-procedure details: wound care instructions given    Destruction of lesion Complexity: simple   Destruction method: electrodesiccation and curettage   Informed consent: discussed and consent obtained   Timeout:  patient name, date of birth, surgical site, and procedure verified Anesthesia: the lesion was anesthetized in a standard fashion   Anesthetic:  1% lidocaine w/ epinephrine 1-100,000 local infiltration Curettage performed in three different directions: Yes   Curettage cycles:  3 Lesion length (cm):  0.6 Lesion width (cm):  1 Margin per side (cm):  0.1 Final wound size (cm):  1.2 Hemostasis achieved with:  aluminum chloride Outcome: patient tolerated procedure well with no complications   Post-procedure details: wound care instructions given    Specimen 1 - Surgical pathology Differential Diagnosis: scc Check Margins: No [id number]  Neoplasm of uncertain behavior (3) Right Axilla superior  Skin / nail biopsy Type of biopsy: tangential   Informed consent: discussed and consent obtained   Timeout: patient name, date of  birth, surgical site, and procedure verified   Anesthesia: the lesion was anesthetized in a standard fashion   Anesthetic:  1% lidocaine w/ epinephrine 1-100,000 local  infiltration Instrument used: flexible razor blade   Hemostasis achieved with: aluminum chloride and electrodesiccation   Outcome: patient tolerated procedure well   Post-procedure details: wound care instructions given    Specimen 3 - Surgical pathology Differential Diagnosis: sk Check Margins: No  Right Axilla inferior  Skin / nail biopsy Type of biopsy: tangential   Informed consent: discussed and consent obtained   Timeout: patient name, date of birth, surgical site, and procedure verified   Anesthesia: the lesion was anesthetized in a standard fashion   Anesthetic:  1% lidocaine w/ epinephrine 1-100,000 local infiltration Instrument used: flexible razor blade   Hemostasis achieved with: aluminum chloride and electrodesiccation   Outcome: patient tolerated procedure well   Post-procedure details: wound care instructions given    Specimen 4 - Surgical pathology Differential Diagnosis: sk Check Margins: No  Left axillae  Skin / nail biopsy Type of biopsy: tangential   Informed consent: discussed and consent obtained   Timeout: patient name, date of birth, surgical site, and procedure verified   Anesthesia: the lesion was anesthetized in a standard fashion   Anesthetic:  1% lidocaine w/ epinephrine 1-100,000 local infiltration Instrument used: flexible razor blade   Hemostasis achieved with: aluminum chloride and electrodesiccation   Outcome: patient tolerated procedure well   Post-procedure details: wound care instructions given    Specimen 5 - Surgical pathology Differential Diagnosis:  Check Margins: No  I, Toinette Lackie, PA-C, have reviewed all documentation for this visit. The documentation on 02/15/20 for the exam, diagnosis, procedures, and orders are all accurate and complete.

## 2019-12-26 ENCOUNTER — Telehealth: Payer: Self-pay | Admitting: *Deleted

## 2019-12-26 NOTE — Telephone Encounter (Signed)
Phone call to patient left message for her to call back.

## 2019-12-26 NOTE — Telephone Encounter (Signed)
Path to patient  

## 2019-12-26 NOTE — Telephone Encounter (Signed)
-----   Message from Warren Danes, [name] sent at 12/25/2019  1:38 PM EDT ----- Hand was treated. RTC if recurs.

## 2020-01-08 ENCOUNTER — Other Ambulatory Visit (INDEPENDENT_AMBULATORY_CARE_PROVIDER_SITE_OTHER): Payer: Medicare PPO

## 2020-01-08 ENCOUNTER — Other Ambulatory Visit: Payer: Self-pay

## 2020-01-08 DIAGNOSIS — E063 Autoimmune thyroiditis: Secondary | ICD-10-CM | POA: Diagnosis not present

## 2020-01-08 DIAGNOSIS — E038 Other specified hypothyroidism: Secondary | ICD-10-CM

## 2020-01-08 LAB — TSH: TSH: 2.7 u[IU]/mL (ref 0.35–4.50)

## 2020-01-08 LAB — T4, FREE: Free T4: 1.02 ng/dL (ref 0.60–1.60)

## 2020-01-09 ENCOUNTER — Telehealth: Payer: Self-pay | Admitting: Internal Medicine

## 2020-01-09 NOTE — Telephone Encounter (Signed)
LM for patient to call back.

## 2020-01-09 NOTE — Telephone Encounter (Signed)
Patient requests to be called at ph# [phone] to be given her lab results

## 2020-01-09 NOTE — Telephone Encounter (Signed)
Patient returning call about lab results.  

## 2020-01-10 NOTE — Telephone Encounter (Signed)
Patient called back to follow up on labs results - requesting a call back to [phone]

## 2020-01-10 NOTE — Telephone Encounter (Signed)
Notified patient of message from Dr. Cruzita Lederer, patient expressed understanding and agreement. No further questions.    Lisa Kingdom, MD  01/08/2020 4:27 PM EDT     Lenna Sciara, can you please call pt: TFTs are normal -please continue the current levothyroxine dose.

## 2020-02-13 ENCOUNTER — Other Ambulatory Visit: Payer: Self-pay | Admitting: Internal Medicine

## 2020-02-15 NOTE — Addendum Note (Signed)
Addended by: Robyne Askew R on: 02/15/2020 04:16 PM   Modules accepted: Level of Service

## 2020-02-15 NOTE — Addendum Note (Signed)
Addended by: Robyne Askew R on: 02/15/2020 04:01 PM   Modules accepted: Orders

## 2020-12-02 ENCOUNTER — Encounter: Payer: Self-pay | Admitting: Internal Medicine

## 2020-12-02 ENCOUNTER — Other Ambulatory Visit: Payer: Self-pay

## 2020-12-02 ENCOUNTER — Ambulatory Visit: Payer: Medicare PPO | Admitting: Internal Medicine

## 2020-12-02 VITALS — BP 120/72 | HR 70 | Ht 63.0 in | Wt 150.0 lb

## 2020-12-02 DIAGNOSIS — E038 Other specified hypothyroidism: Secondary | ICD-10-CM

## 2020-12-02 DIAGNOSIS — E063 Autoimmune thyroiditis: Secondary | ICD-10-CM

## 2020-12-02 LAB — TSH: TSH: 3.35 u[IU]/mL (ref 0.35–4.50)

## 2020-12-02 LAB — T4, FREE: Free T4: 0.8 ng/dL (ref 0.60–1.60)

## 2020-12-02 MED ORDER — LEVOTHYROXINE SODIUM 50 MCG PO TABS: ORAL_TABLET | ORAL | 3 refills | Status: DC

## 2020-12-02 NOTE — Patient Instructions (Signed)
Please continue Levothyroxine 50 mcg daily.  Take the thyroid hormone every day, with water, at least 30 minutes before breakfast, separated by at least 4 hours from: - acid reflux medications - calcium - iron - multivitamins  Please stop at the lab.  Please come back for a follow-up appointment in 1 year. 

## 2020-12-02 NOTE — Progress Notes (Signed)
Patient ID: [name], female   DOB: 11-19-1950, 70 y.o.   MRN: [id number]  This visit occurred during the SARS-CoV-2 public health emergency.  Safety protocols were in place, including screening questions prior to the visit, additional usage of staff PPE, and extensive cleaning of exam room while observing appropriate contact time as indicated for disinfecting solutions.   HPI  [name] is a 70 y.o.-year-old female, initially referred by Dr Phineas Real for management of hypothyroidism, diagnosed as secondary to Hashimoto's thyroiditis, returning for follow-up.  Last visit 1 year ago.  Interim history: She still has hot flushes and joint pain.  However, she continues to work part-time and also plays tennis almost every day, for approximately 2 hours. She had Covid19 in 06/2020.  She recovered well-no long Covid. She has insomnia, waking up several times a night.  This is not new.  Reviewed history: Patient was diagnosed with hypothyroidism in 10/2014 and started on levothyroxine 50 mcg daily.   We did not have to change the dose since then.  She takes her levothyroxine: - in am (~4 am) - fasting - at least 30 min from b'fast and coffee + creamer - no + Ca >4h after LT4 - + Fe, + MVI at 10-11 pm >> moved these to dinnertime at last visit - no PPIs - + Biotin - not for last week  Reviewed her TFTs: Lab Results  Component Value Date   TSH 2.70 01/08/2020   TSH 2.32 11/28/2018   TSH 1.59 12/13/2017   TSH 1.68 12/04/2016   TSH 3.13 08/28/2016   TSH 1.97 10/18/2015   TSH 1.14 04/15/2015   TSH 1.72 01/03/2015   TSH 5.026 (H) 11/12/2014   TSH 4.637 (H) 11/07/2014   FREET4 1.02 01/08/2020   FREET4 0.88 11/28/2018   FREET4 0.90 12/13/2017   FREET4 0.87 12/04/2016   FREET4 0.95 08/28/2016   FREET4 1.01 10/18/2015   FREET4 1.05 04/15/2015    Patient's TPO antibodies were elevated, confirming Hashimoto's thyroiditis Component     Latest Ref Rng 04/15/2015   Thyroperoxidase Ab SerPl-aCnc     <9 IU/mL 151 (H)  Thyroglobulin Ab     <2 IU/mL <1   Pt denies: - feeling nodules in neck - hoarseness - dysphagia - choking - SOB with lying down  No FH of thyroid cancer. No h/o radiation tx to head or neck.  No seaweed or kelp. No recent contrast studies. No herbal supplements. No Biotin use. No recent steroids use.   She sees Dr Estanislado Pandy (rheum).  She was investigated for possible autoimmune disease in the past, but she does not have a clear diagnosis.  She is walking and still plays tennis.  She plays tournaments.  She had THR in 11/2015.  ROS: Constitutional: + weight gain/no weight loss, + fatigue, + hot flashes, no subjective hypothermia Eyes: no blurry vision, no xerophthalmia ENT: no sore throat, + see HPI Cardiovascular: no CP/no SOB/no palpitations/no leg swelling Respiratory: no cough/no SOB/no wheezing Gastrointestinal: no N/no V/no D/no C/no acid reflux Musculoskeletal: no muscle aches/+ joint aches Skin: no rashes, no hair loss Neurological: no tremors/no numbness/no tingling/no dizziness  I reviewed pt's medications, allergies, PMH, social hx, family hx, and changes were documented in the history of present illness. Otherwise, unchanged from my initial visit note.   Past Medical History:  Diagnosis Date  . Arthritis   . Cataract   . Complication of anesthesia    SPINAL  -  CONVULSIONS (PLACED IN WRONG  PLACE)  . Degenerative disc disease   . Hypothyroidism   . 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 PONV (postoperative nausea and vomiting)   . 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 CARPAL TUNNEL RELEASE     BILAT    . CATARACT EXTRACTION     BILAT   . ECTOPIC PREGNANCY SURGERY    . EYE SURGERY    . KNEE CARTILAGE SURGERY     RIGHT    (MENISCUS)  . TONSILLECTOMY    . TOTAL  HIP ARTHROPLASTY Left 12/19/2015  . TOTAL HIP ARTHROPLASTY Left 12/19/2015   Procedure: LEFT TOTAL HIP ARTHROPLASTY;  Surgeon: Garald Balding, MD;  Location: [facility];  Service: Orthopedics;  Laterality: Left;  . TRIGGER FINGER RELEASE    . TUBAL LIGATION     Social History   Social History  . Marital Status: Married    Spouse Name: N/A  . Number of Children: 2   Occupational History  .  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 Smoking status: Never Smoker   . Smokeless tobacco: Not on file  . Alcohol Use: 0.0 oz/week    0 Standard drinks or equivalent per week     Comment: Rare  . Drug Use: No   Current Outpatient Medications on File Prior to Visit  Medication Sig Dispense Refill  . Biotin 5000 MCG TABS Take 5,000 mcg by mouth daily.    . Calcium Carb-Cholecalciferol (CALCIUM 1000 + D PO) Take by mouth.    . diclofenac sodium (VOLTAREN) 1 % GEL Apply 2-4 g topically 4 (four) times daily. 3 Tube 3  . ferrous sulfate 325 (65 FE) MG EC tablet Take 325 mg by mouth at bedtime.    . Glucosamine-Chondroit-Vit C-Mn (GLUCOSAMINE CHONDR 1500 COMPLX PO) Take by mouth.    . levothyroxine (SYNTHROID) 50 MCG tablet TAKE ONE TABLET BY MOUTH DAILY IN THE MORNING 90 tablet 3  . Multiple Vitamin (MULTIVITAMIN) tablet Take 1 tablet by mouth daily.    . Triprolidine-Pseudoephedrine (ANTIHISTAMINE PO) Take by mouth.     No current facility-administered medications on file prior to visit.   Allergies  Allergen Reactions  . Sulfa Antibiotics Swelling  . Motrin [Ibuprofen] Other (See Comments)    Constipation in high doses   Family History  Problem Relation Age of Onset  . Uterine cancer Mother   . Heart attack Mother   . Hypertension Mother   . Kidney cancer Father   . Lung cancer Father   . Diabetes Sister   . Thyroid disease Neg Hx    PE: BP 120/72 (BP Location: Right Arm, Patient Position: Sitting, Cuff Size: Normal)   Pulse 70   Ht 5\' 3"   (1.6 m)   Wt 150 lb (68 kg)   SpO2 97%   BMI 26.57 kg/m  Body mass index is 26.57 kg/m.  Wt Readings from Last 3 Encounters:  12/02/20 150 lb (68 kg)  12/04/19 147 lb (66.7 kg)  11/28/18 140 lb (63.5 kg)   Constitutional: normal weight, in NAD Eyes: PERRLA, EOMI, no exophthalmos ENT: moist mucous membranes, + slight symmetric thyromegaly, no cervical lymphadenopathy Cardiovascular: RRR, No MRG Respiratory: CTA B Gastrointestinal: abdomen soft, NT, ND, BS+ Musculoskeletal: no deformities, strength intact in all 4 Skin: moist, warm, no rashes, + very tanned  Neurological: no tremor with outstretched hands, DTR normal in all 4  ASSESSMENT: 1. Hypothyroidism due to Hashimoto's thyroiditis  PLAN:  1. Patient with history of autoimmune hypothyroidism, on levothyroxine therapy - latest thyroid labs reviewed with pt. >> normal: Lab Results  Component Value Date   TSH 2.70 01/08/2020  - she continues on LT4 50 mcg daily - pt feels good on this dose.  At last visit, she had a 7 pound weight gain, 3 more since then. - we discussed about taking the thyroid hormone every day, with water, >30 minutes before breakfast, separated by >4 hours from acid reflux medications, calcium, iron, multivitamins. Pt. is taking it correctly.  At last visit, she was taking levothyroxine to close to iron, calcium, and multivitamins and we discussed about moving these to dinnertime. - will check thyroid tests today: TSH and fT4 - If labs are abnormal, she will need to return for repeat TFTs in 1.5 months -  I will see her back in 1 year  Needs refills.  Component     Latest Ref Rng & Units 12/02/2020  TSH     0.35 - 4.50 uIU/mL 3.35  T4,Free(Direct)     0.60 - 1.60 ng/dL 0.80   Normal TFTs.  Philemon Kingdom, MD PhD [facility] Endocrinology

## 2021-02-18 ENCOUNTER — Ambulatory Visit: Payer: Medicare PPO | Admitting: Physician Assistant

## 2021-05-06 ENCOUNTER — Other Ambulatory Visit: Payer: Self-pay

## 2021-05-06 ENCOUNTER — Emergency Department (HOSPITAL_BASED_OUTPATIENT_CLINIC_OR_DEPARTMENT_OTHER)
Admission: EM | Admit: 2021-05-06 | Discharge: 2021-05-06 | Disposition: A | Discharge status: Home / Self Care | Payer: Medicare PPO | Attending: Emergency Medicine | Admitting: Emergency Medicine

## 2021-05-06 ENCOUNTER — Encounter (HOSPITAL_BASED_OUTPATIENT_CLINIC_OR_DEPARTMENT_OTHER): Payer: Self-pay

## 2021-05-06 ENCOUNTER — Emergency Department (HOSPITAL_BASED_OUTPATIENT_CLINIC_OR_DEPARTMENT_OTHER): Payer: Medicare PPO

## 2021-05-06 DIAGNOSIS — Z96642 Presence of left artificial hip joint: Secondary | ICD-10-CM | POA: Insufficient documentation

## 2021-05-06 DIAGNOSIS — E039 Hypothyroidism, unspecified: Secondary | ICD-10-CM | POA: Insufficient documentation

## 2021-05-06 DIAGNOSIS — W010XXA Fall on same level from slipping, tripping and stumbling without subsequent striking against object, initial encounter: Secondary | ICD-10-CM | POA: Diagnosis not present

## 2021-05-06 DIAGNOSIS — M25522 Pain in left elbow: Secondary | ICD-10-CM | POA: Diagnosis present

## 2021-05-06 DIAGNOSIS — Z79899 Other long term (current) drug therapy: Secondary | ICD-10-CM | POA: Diagnosis not present

## 2021-05-06 DIAGNOSIS — R2232 Localized swelling, mass and lump, left upper limb: Secondary | ICD-10-CM | POA: Diagnosis not present

## 2021-05-06 DIAGNOSIS — W19XXXA Unspecified fall, initial encounter: Secondary | ICD-10-CM

## 2021-05-06 NOTE — Discharge Instructions (Addendum)
[facility] to the [facility] today to be evaluated for your left elbow pain after suffering a fall.  Your x-ray showed no broken bones or dislocations.  It did show a joint effusion.  You will need to follow-up with your orthopedic provider.  Please call to schedule a follow-up appointment.  Please read attached paperwork on proper use of sling and make sure to perform range of motion to your shoulder to avoid a frozen shoulder.  Get help right away if: You cannot move the joint. Your fingers or toes tingle, become numb, or turn cold and blue. You have a fever along with a joint that is red, warm, and swollen.

## 2021-05-06 NOTE — ED Notes (Signed)
Pt declined ice pack

## 2021-05-06 NOTE — ED Triage Notes (Signed)
Pt states she fel playing tennis ~930am-pain to left elbow-NAD-steady gait

## 2021-05-06 NOTE — ED Notes (Signed)
Pt discharged to home. Discharge instructions have been discussed with patient and/or family members. Pt verbally acknowledges understanding d/c instructions, and endorses comprehension to checkout at registration before leaving. Pt receiving would care and having sling applied.

## 2021-05-06 NOTE — ED Provider Notes (Signed)
[place] EMERGENCY DEPARTMENT Provider Note   CSN: [id number] Arrival date & time: 05/06/21  1040     History Chief Complaint  Patient presents with   [name] is a 70 y.o. female presents to the emergency department with a chief complaint of left elbow pain after suffering a fall.  Patient states that this morning approximately 0 930 she was playing tennis when she tripped over her feet landing on her left side.  Patient denies hitting her head or any loss of consciousness.  Patient is not on any blood thinners.  Patient has had pain to left elbow since this fall.  Pain is only present when she is moving that elbow.  At rest patient has no pain.  Patient has not taken any modalities to alleviate her symptoms.  Patient complains of swelling to left elbow.  Patient has been ambulatory without difficulty since this fall.  Patient denies any numbness, weakness, neck pain, back pain, saddle esthesia, bowel or bladder dysfunction, visual disturbance, headache.  Patient is right-hand dominant.  Last tetanus shot within 2 years.   Fall Pertinent negatives include no chest pain, no abdominal pain, no headaches and no shortness of breath.      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   Hypothyroidism due to Hashimoto's thyroiditis 10/15/2015   Degenerative disc disease    Arthritis     Past Surgical History:  Procedure Laterality Date   CARPAL  TUNNEL RELEASE     BILAT     CATARACT EXTRACTION     BILAT    ECTOPIC PREGNANCY SURGERY     EYE SURGERY     KNEE CARTILAGE SURGERY     RIGHT    (MENISCUS)   TONSILLECTOMY     TOTAL HIP ARTHROPLASTY Left 12/19/2015   TOTAL HIP ARTHROPLASTY Left 12/19/2015   Procedure: LEFT TOTAL HIP ARTHROPLASTY;  Surgeon: Garald Balding, MD;  Location: [facility];  Service: Orthopedics;  Laterality: Left;   TRIGGER FINGER RELEASE     TUBAL LIGATION       OB History     Gravida  3   Para  2   Term  2   Preterm      AB  1   Living  2      SAB      IAB      Ectopic  1   Multiple      Live Births              Family History  Problem Relation Age of Onset   Uterine cancer Mother    Heart attack Mother    Hypertension Mother    Kidney cancer Father    Lung cancer Father    Diabetes Sister    Thyroid disease Neg Hx     Social History   Tobacco Use   Smoking status:  Never   Smokeless tobacco: Never  Vaping Use   Vaping Use: Never used  Substance Use Topics   Alcohol use: Yes    Alcohol/week: 0.0 standard drinks    Comment: Rare   Drug use: No    Home Medications Prior to Admission medications   Medication Sig Start Date End Date Taking? Authorizing Provider  Biotin 5000 MCG TABS Take 5,000 mcg by mouth daily.    [provider]  Calcium Carb-Cholecalciferol (CALCIUM 1000 + D PO) Take by mouth.    [provider]  diclofenac sodium (VOLTAREN) 1 % GEL Apply 2-4 g topically 4 (four) times daily. 04/19/18   Cherylann Ratel, PA-C  ferrous sulfate 325 (65 FE) MG EC tablet Take 325 mg by mouth at bedtime.    [provider]  Glucosamine-Chondroit-Vit C-Mn (GLUCOSAMINE CHONDR 1500 COMPLX PO) Take by mouth.    [provider]  levothyroxine (SYNTHROID) 50 MCG tablet TAKE ONE TABLET BY MOUTH DAILY IN THE MORNING 12/02/20   Philemon Kingdom, MD  Multiple Vitamin (MULTIVITAMIN) tablet Take 1 tablet by mouth daily.    [provider]  Triprolidine-Pseudoephedrine (ANTIHISTAMINE PO) Take by mouth.    [provider]    Allergies    Sulfa antibiotics and Motrin [ibuprofen]  Review of Systems   Review of Systems  Constitutional:  Negative for chills and fever.  Eyes:  Negative for visual disturbance.  Respiratory:  Negative for shortness of breath.   Cardiovascular:  Negative for chest pain.  Gastrointestinal:  Negative for abdominal pain, nausea and vomiting.  Genitourinary:  Negative for difficulty urinating, dysuria and enuresis.  Musculoskeletal:  Positive for arthralgias and joint swelling. Negative for back pain, neck pain and neck stiffness.  Skin:  Negative for color change and rash.  Neurological:  Negative for dizziness, tremors, seizures, syncope, facial asymmetry, speech difficulty, weakness, light-headedness, numbness and headaches.  Psychiatric/Behavioral:  Negative for confusion.    Physical Exam Updated Vital Signs BP 129/75 (BP Location: Right Arm)   Pulse 63   Temp 98.4 F (36.9 C) (Oral)   Resp 18   Ht 5\' 4"  (1.626 m)   Wt 67.6 kg   SpO2 100%   BMI 25.58 kg/m   Physical Exam Vitals and nursing note reviewed.  Constitutional:      General: She is not in acute distress.    Appearance: She is not ill-appearing, toxic-appearing or diaphoretic.  HENT:     Head: Normocephalic and atraumatic. No raccoon eyes, Battle's sign, abrasion, contusion, masses or laceration.  Eyes:     General: No scleral icterus.       Right eye: No discharge.        Left eye: No discharge.     Extraocular Movements: Extraocular movements intact.     Conjunctiva/sclera: Conjunctivae normal.     Pupils: Pupils are equal, round, and reactive to light.  Cardiovascular:     Rate and Rhythm: Normal rate.     Pulses:          Radial pulses are 2+ on the right side and 2+ on the left side.  Pulmonary:     Effort: Pulmonary effort is normal.  Musculoskeletal:     Left elbow: Swelling and effusion  present. No deformity or lacerations. Decreased range of motion. Tenderness present.     Left forearm: Normal.     Left wrist: Normal.     Left hand: No swelling, deformity, lacerations, tenderness or bony tenderness. Normal range of  motion. Normal strength. Normal sensation. Normal capillary refill.     Cervical back: Normal.     Thoracic back: No swelling, edema, deformity, signs of trauma, lacerations, spasms, tenderness or bony tenderness.     Lumbar back: No swelling, edema, deformity, signs of trauma, lacerations, spasms, tenderness or bony tenderness.     Comments: No tenderness, bony tenderness, or deformity to bilateral lower extremities.  No shortening or rotation to bilateral lower extremities.  Pelvis stable  No tenderness, bony tenderness, or deformity to right upper extremity.    Patient able to perform pronation and supination to left arm without difficulty.  Full extension of left elbow without difficulty.  Slightly decreased flexion to left elbow secondary to complaints of pain.  Patient has swelling to left elbow with overlying superficial abrasion.  Skin:    General: Skin is warm and dry.  Neurological:     General: No focal deficit present.     Mental Status: She is alert and oriented to person, place, and time.     GCS: GCS eye subscore is 4. GCS verbal subscore is 5. GCS motor subscore is 6.     Cranial Nerves: Cranial nerves 2-12 are intact. No cranial nerve deficit, dysarthria or facial asymmetry.     Comments: Patient moves all limbs without difficulty.  Psychiatric:        Behavior: Behavior is cooperative.    ED Results / Procedures / Treatments   Labs (all labs ordered are listed, but only abnormal results are displayed) Labs Reviewed - No data to display  EKG None  Radiology DG Elbow Complete Left  Result Date: 05/06/2021 CLINICAL DATA:  Fall today with elbow pain EXAM: LEFT ELBOW - COMPLETE 3+ VIEW COMPARISON:  None. FINDINGS: Elbow joint effusion with  readily visible ventral fat pad and visible posterior fat pad. No detected fracture or subluxation. Subjective osteopenia. IMPRESSION: Joint effusion without subluxation or visible fracture, recommend follow-up. Electronically Signed   By: Jorje Guild M.D.   On: 05/06/2021 11:55    Procedures Procedures   Medications Ordered in ED Medications - No data to display  ED Course  I have reviewed the triage vital signs and the nursing notes.  Pertinent labs & imaging results that were available during my care of the patient were reviewed by me and considered in my medical decision making (see chart for details).    MDM Rules/Calculators/A&P                           Alert 70 year old female no acute distress, nontoxic-appearing.  Presents emergency department with chief complaint of left elbow pain after suffering a mechanical fall.  Patient denies hitting her head or any loss of consciousness.  Patient is not on any blood thinners.  Patient has no neck pain or back pain.  No Tenderness or deformity to cervical, thoracic, or lumbar spine.  Head is atraumatic.  Patient is adamant that she did not hit her head.  Patient denies any numbness, weakness, saddle anesthesia, bowel or bladder dysfunction.  Low suspicion for intracranial or spinal injury at this time.  X-ray imaging of left elbow shows no fracture or dislocation.  Joint effusion present.  Patient has full extension to left elbow, slightly decreased flexion to left elbow.  Patient has full pronation and supination.  Pulse, motor, and sensation intact distally.  Superficial abrasion overlying left elbow.  Patient's tetanus shot is up-to-date.  Abrasion was cleaned and bandaged.  Plan to place patient in sling and have her follow-up with her orthopedic provider.  Importance of shoulder range of motion stressed to patient.  Patient to use over-the-counter pain medication as needed for her discomfort.  Patient care discussed with attending  physician Dr. Kellie Simmering.  Discussed results, findings, treatment and follow up. Patient advised of return precautions. Patient verbalized understanding and agreed with plan.     Final Clinical Impression(s) / ED Diagnoses Final diagnoses:  Fall, initial encounter  Left elbow pain    Rx / DC Orders ED Discharge Orders     None        Loni Beckwith, PA-C 05/06/21 1346    Regan Lemming, MD 05/06/21 1643

## 2021-05-13 ENCOUNTER — Ambulatory Visit: Payer: Medicare PPO | Admitting: Orthopaedic Surgery

## 2021-05-13 ENCOUNTER — Other Ambulatory Visit: Payer: Self-pay

## 2021-05-13 ENCOUNTER — Encounter: Payer: Self-pay | Admitting: Orthopaedic Surgery

## 2021-05-13 DIAGNOSIS — M25522 Pain in left elbow: Secondary | ICD-10-CM

## 2021-05-13 NOTE — Progress Notes (Signed)
Office Visit Note   Patient: [name]           Date of Birth: 12/05/1950           MRN: [id number] Visit Date: 05/13/2021              Requested by: No referring provider defined for this encounter. PCP: Patient, No Pcp Per (Inactive)   Assessment & Plan: Visit Diagnoses: No diagnosis found.  Plan: Patient is a 70 year old right hand dominant woman who is 1 week s/p fall onto her left elbow while she was playing tennis.  She did continue to play tennis afterwards and has since played at least 2 times.  She was seen and evaluated in the emergency room.  X-rays there did demonstrate a positive fat pad sign but no obvious fracture of the radial head or anywhere in her elbow.  She was given a sling but she says she is not using it.  She has very little discomfort.  She has more discomfort from the abrasion and scabbing when she places her elbow on things.  She is right-handed.  Given the fat pad sign cannot rule out an occult fracture of the radial head.  Patient is not very symptomatic.  We have advised her to refrain from high risk activities that would cause her to fall on this again in the next few weeks.  She understands if she has another fall onto the shoulder it could displace the fracture requiring surgery.  She will follow-up if pain does not continue to resolve in the next few weeks  Follow-Up Instructions: No follow-ups on file.   Orders:  No orders of the defined types were placed in this encounter.  No orders of the defined types were placed in this encounter.     Procedures: No procedures performed   Clinical Data: No additional findings.   Subjective: Chief Complaint  Patient presents with   Left Elbow - Pain  Patient presents today for her left elbow. She fell over a week ago and landed on her left elbow. [facility] to [facility] and had x-rays taken. She was told that she didn't fracture anything. She has noticed improvement and is able  to play tennis. She said that it is sore. She is not taking anything for pain. She is right hand dominant.     Review of Systems  All other systems reviewed and are negative.   Objective: Vital Signs: There were no vitals taken for this visit.  Physical Exam Constitutional:      Appearance: Normal appearance.  Pulmonary:     Effort: Pulmonary effort is normal.  Neurological:     General: No focal deficit present.     Mental Status: She is alert and oriented to person, place, and time.    Ortho Exam Examination of her left elbow.  She does have some revolve resolving ecchymosis over the medial side of her upper arm.  Also an abrasion which is well scabbed without surrounding cellulitis erythema or drainage.  She has full extension flexion of her elbow though coming to extension is slightly tender.  Distal circulation and strength is intact.  Mildly tender over the radial head to palpation.  No tenderness over the medial lateral epicondyle.  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   Hypothyroidism due to Hashimoto's thyroiditis 10/15/2015   Degenerative disc disease    Arthritis    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   Top of Left Hand(Tx p Bx)    Family History  Problem Relation Age of Onset   Uterine cancer Mother    Heart attack Mother    Hypertension Mother    Kidney cancer Father    Lung cancer  Father    Diabetes Sister    Thyroid disease Neg Hx     Past Surgical History:  Procedure Laterality Date   CARPAL TUNNEL RELEASE     BILAT     CATARACT EXTRACTION     BILAT    ECTOPIC PREGNANCY SURGERY     EYE SURGERY     KNEE CARTILAGE SURGERY     RIGHT    (MENISCUS)   TONSILLECTOMY     TOTAL HIP ARTHROPLASTY Left 12/19/2015   TOTAL HIP ARTHROPLASTY Left 12/19/2015   Procedure: LEFT TOTAL HIP ARTHROPLASTY;  Surgeon: Garald Balding, MD;  Location: [place];  Service: Orthopedics;  Laterality: Left;   TRIGGER FINGER RELEASE     TUBAL LIGATION     Social History   Occupational History   Not on file  Tobacco Use   Smoking status: Never   Smokeless tobacco: Never  Vaping Use   Vaping Use: Never used  Substance and Sexual Activity   Alcohol use: Yes    Alcohol/week: 0.0 standard drinks    Comment: Rare   Drug use: No   Sexual activity: Yes    Birth control/protection: Surgical, Post-menopausal    Comment: Tubal lig-1st intercourse 70 yo-Fewer than 5 partners

## 2021-06-16 ENCOUNTER — Telehealth: Payer: Self-pay | Admitting: Internal Medicine

## 2021-12-01 ENCOUNTER — Encounter: Payer: Self-pay | Admitting: Internal Medicine

## 2021-12-01 ENCOUNTER — Ambulatory Visit: Payer: Medicare PPO | Admitting: Internal Medicine

## 2021-12-01 VITALS — BP 110/68 | HR 55 | Ht 64.0 in | Wt 151.6 lb

## 2021-12-01 DIAGNOSIS — E063 Autoimmune thyroiditis: Secondary | ICD-10-CM | POA: Diagnosis not present

## 2021-12-01 DIAGNOSIS — E038 Other specified hypothyroidism: Secondary | ICD-10-CM

## 2021-12-01 LAB — TSH: TSH: 4.67 u[IU]/mL (ref 0.35–5.50)

## 2021-12-01 LAB — T4, FREE: Free T4: 0.78 ng/dL (ref 0.60–1.60)

## 2021-12-01 MED ORDER — LEVOTHYROXINE SODIUM 75 MCG PO TABS: 75.0000 ug | ORAL_TABLET | Freq: Every day | ORAL | 6 refills | Status: DC

## 2021-12-01 NOTE — Progress Notes (Addendum)
Patient ID: [name], female   DOB: [birth year], 71 y.o.   MRN: [id number]  HPI  [name] is a 71 y.o.-year-old female, initially referred by Dr Phineas Real for management of hypothyroidism, diagnosed as secondary to Hashimoto's thyroiditis, returning for follow-up.  Last visit 1 year ago. She has a new PCP now - Dr. Cathlean Sauer in [facility].  Interim history: She still has joint pain (R knee).  However, she continues to work part-time and also plays tennis almost every day, for approximately 2 hours. In 04/2021, she fell and hurt her elbow while playing tennis.  She had a fracture and joint effusion.  This resolved. She has insomnia and hot flashes, waking up several times a night.  This is chronic for her. She had Covid in 06/2021. She recovered well. She is preparing to go on a trip to [place], [place], and [place] this fall.  Reviewed history: Patient was diagnosed with hypothyroidism in 10/2014 and started on levothyroxine 50 mcg daily.   We did not have to change the dose since then.  She takes her levothyroxine: - in am (~4 am) - fasting - at least 1h from b'fast and coffee + creamer - no + Ca >4h after LT4 - + Fe, + MVI at 10-11 pm >> moved these to dinnertime at last visit - no PPIs - stopped Biotin   Reviewed her TFTs: Lab Results  Component Value Date   TSH 3.35 12/02/2020   TSH 2.70 01/08/2020   TSH 2.32 11/28/2018   TSH 1.59 12/13/2017   TSH 1.68 12/04/2016   TSH 3.13 08/28/2016   TSH 1.97 10/18/2015   TSH 1.14 04/15/2015   TSH 1.72 01/03/2015   TSH 5.026 (H) 11/12/2014   FREET4 0.80 12/02/2020   FREET4 1.02 01/08/2020   FREET4 0.88 11/28/2018   FREET4 0.90 12/13/2017   FREET4 0.87 12/04/2016   FREET4 0.95 08/28/2016   FREET4 1.01 10/18/2015   FREET4 1.05 04/15/2015    Patient's TPO antibodies were elevated, confirming Hashimoto's thyroiditis Component     Latest Ref Rng 04/15/2015  Thyroperoxidase Ab SerPl-aCnc     <9 IU/mL 151 (H)   Thyroglobulin Ab     <2 IU/mL <1   Pt denies: - feeling nodules in neck - hoarseness - dysphagia - choking - SOB with lying down  No FH of thyroid cancer. No h/o radiation tx to head or neck. No herbal supplements. No Biotin use. No recent steroids use.   She was seeing Dr Estanislado Pandy (rheum).  She was investigated for possible autoimmune disease in the past, but she does not have a clear diagnosis.  She is walking and still plays tennis.  She plays tournaments.  She had THR in 11/2015.  ROS: + see HPI Constitutional: + weight gain+ fatigue, + hot flashes Musculoskeletal: no muscle aches/+ joint aches  I reviewed pt's medications, allergies, PMH, social hx, family hx, and changes were documented in the history of present illness. Otherwise, unchanged from my initial visit note.   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   Top of Left Hand(Tx p Bx)   Past Surgical History:  Procedure Laterality Date   CARPAL TUNNEL RELEASE     BILAT     CATARACT EXTRACTION     BILAT    ECTOPIC PREGNANCY SURGERY     EYE SURGERY     KNEE CARTILAGE SURGERY     RIGHT    (MENISCUS)   TONSILLECTOMY     TOTAL HIP ARTHROPLASTY Left 12/19/2015   TOTAL HIP ARTHROPLASTY Left 12/19/2015   Procedure: LEFT TOTAL HIP ARTHROPLASTY;  Surgeon: Garald Balding, MD;  Location: [facility];  Service: Orthopedics;  Laterality: Left;   TRIGGER FINGER RELEASE     TUBAL LIGATION     Social History   Social History   Marital Status: Married    Spouse Name: N/A   Number of Children: 2   Occupational History    retired Tourist information centre manager, 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     Calcium Carb-Cholecalciferol (CALCIUM 1000 + D PO) Take by mouth.     diclofenac sodium (VOLTAREN) 1 % GEL Apply 2-4 g topically 4 (four) times daily. 3 Tube 3   ferrous sulfate 325 (65 FE) MG EC tablet Take 325 mg by mouth at bedtime.     Glucosamine-Chondroit-Vit C-Mn (GLUCOSAMINE CHONDR 1500 COMPLX PO) Take by mouth.     levothyroxine (SYNTHROID) 50 MCG tablet TAKE ONE TABLET BY MOUTH DAILY IN THE MORNING 90 tablet 3   Multiple Vitamin (MULTIVITAMIN) tablet Take 1 tablet by mouth daily.     Triprolidine-Pseudoephedrine (ANTIHISTAMINE PO) Take by mouth.     No current facility-administered medications on file prior to visit.   Allergies  Allergen Reactions   Sulfa Antibiotics Swelling   Motrin [Ibuprofen] Other (See Comments)    Constipation in high doses   Family History  Problem Relation Age of Onset   Uterine cancer Mother    Heart attack Mother    Hypertension Mother    Kidney cancer Father    Lung cancer Father    Diabetes Sister    Thyroid disease Neg Hx    PE: BP 110/68 (BP Location: Right Arm, Patient Position: Sitting, Cuff Size: Normal)   Pulse (!) 55   Ht '5\' 4"'$  (1.626 m)   Wt 151 lb 9.6 oz (68.8 kg)   SpO2 98%   BMI 26.02 kg/m    Wt Readings from Last 3 Encounters:  12/01/21 151 lb 9.6 oz (68.8 kg)  05/06/21 149 lb (67.6 kg)  12/02/20 150 lb (68 kg)   Constitutional: normal weight, in NAD Eyes: PERRLA, EOMI, no exophthalmos ENT: moist mucous membranes, + ~1.5 cm right-sided thyroid nodule palpated, no cervical lymphadenopathy Cardiovascular: RRR, No MRG Respiratory: CTA B Musculoskeletal: no deformities Skin: moist, warm, no  rashes, + very tanned Neurological: no tremor with outstretched hands  ASSESSMENT: 1. Hypothyroidism due to Hashimoto's thyroiditis  2.  Right thyroid nodule  PLAN:  1. Patient with history of autoimmune hypothyroidism, on levothyroxine therapy - latest thyroid labs reviewed with pt. >> normal: Lab Results  Component Value Date   TSH 3.35 12/02/2020  - she continues on LT4 50 mcg  daily - pt feels good on this dose. - we discussed about taking the thyroid hormone every day, with water, >30 minutes before breakfast, separated by >4 hours from acid reflux medications, calcium, iron, multivitamins. Pt. is taking it correctly. - will check thyroid tests today: TSH and fT4 - If labs are abnormal, she will need to return for repeat TFTs in 1.5 months -I will see her back in 1 year  2.  Right thyroid nodule -At today's visit, I cannot palpate the right-sided mass/nodule, but we discussed that this may be just irritation of her thyroid -No neck compression symptoms -We will check a thyroid ultrasound today  Needs refills.  Component     Latest Ref Rng 12/01/2021  TSH     0.35 - 5.50 uIU/mL 4.67   T4,Free(Direct)     0.60 - 1.60 ng/dL 0.78    TSH higher >> will increase the dose of levothyroxine to 75 mcg daily and have her back for labs in 1.5 mo.  Thyroid U/S (12/17/2021): Parenchymal Echotexture: Moderately heterogenous  Isthmus: 3 mm  Right lobe: 4.5 x 2.1 x 1.4 cm  Left lobe: 3.9 x 1.2 x 1.2 cm _________________________________________________________   Nodule # 1:  Location: Isthmus; right  Maximum size: 1.3 cm; Other 2 dimensions: 1.1 x 0.6 cm  Composition: solid/almost completely solid (2)  Echogenicity: hypoechoic (2)  ACR TI-RADS total points: 4.  *Given size (>/= 1 - 1.4 cm) and appearance, a follow-up ultrasound in 1 year should be considered based on TI-RADS criteria.  _________________________________________________________   Additional subcentimeter  hypoechoic nodule in the left thyroid superiorly measures only 6 mm. This would not meet criteria for any biopsy or follow-up and is not fully detailed by TI rads criteria.   Moderate thyroid heterogeneity suggesting component of medical thyroid disease. Correlate with thyroid function tests. No hypervascularity. No regional adenopathy.   IMPRESSION: 1.3 cm right isthmus TR 4 nodule meets criteria for follow-up in 1 year.   Other findings as above.    Philemon Kingdom, MD PhD [facility] Endocrinology

## 2021-12-01 NOTE — Patient Instructions (Signed)
Please continue Levothyroxine 50 mcg daily.  Take the thyroid hormone every day, with water, at least 30 minutes before breakfast, separated by at least 4 hours from: - acid reflux medications - calcium - iron - multivitamins  Please stop at the lab.  Please come back for a follow-up appointment in 1 year. 

## 2021-12-16 ENCOUNTER — Other Ambulatory Visit: Payer: Medicare PPO

## 2021-12-16 ENCOUNTER — Ambulatory Visit
Admission: RE | Admit: 2021-12-16 | Discharge: 2021-12-16 | Disposition: A | Discharge status: Home / Self Care | Payer: Medicare PPO | Source: Ambulatory Visit | Attending: Internal Medicine | Admitting: Internal Medicine

## 2021-12-16 DIAGNOSIS — E038 Other specified hypothyroidism: Secondary | ICD-10-CM

## 2021-12-22 ENCOUNTER — Other Ambulatory Visit: Payer: Medicare PPO

## 2022-01-19 ENCOUNTER — Other Ambulatory Visit (INDEPENDENT_AMBULATORY_CARE_PROVIDER_SITE_OTHER): Payer: Medicare PPO

## 2022-01-19 DIAGNOSIS — E038 Other specified hypothyroidism: Secondary | ICD-10-CM

## 2022-01-19 DIAGNOSIS — E063 Autoimmune thyroiditis: Secondary | ICD-10-CM

## 2022-01-19 LAB — TSH: TSH: 2.69 u[IU]/mL (ref 0.35–5.50)

## 2022-01-19 LAB — T4, FREE: Free T4: 0.81 ng/dL (ref 0.60–1.60)

## 2022-01-26 ENCOUNTER — Other Ambulatory Visit: Payer: Medicare PPO

## 2022-05-05 ENCOUNTER — Ambulatory Visit: Payer: Medicare PPO | Admitting: Orthopaedic Surgery

## 2022-05-06 ENCOUNTER — Ambulatory Visit (INDEPENDENT_AMBULATORY_CARE_PROVIDER_SITE_OTHER): Payer: Medicare PPO

## 2022-05-06 ENCOUNTER — Ambulatory Visit: Payer: Medicare PPO | Admitting: Orthopaedic Surgery

## 2022-05-06 ENCOUNTER — Encounter: Payer: Self-pay | Admitting: Orthopaedic Surgery

## 2022-05-06 DIAGNOSIS — M25561 Pain in right knee: Secondary | ICD-10-CM | POA: Diagnosis not present

## 2022-05-06 DIAGNOSIS — M25522 Pain in left elbow: Secondary | ICD-10-CM | POA: Diagnosis not present

## 2022-05-06 DIAGNOSIS — M1711 Unilateral primary osteoarthritis, right knee: Secondary | ICD-10-CM

## 2022-05-06 DIAGNOSIS — M170 Bilateral primary osteoarthritis of knee: Secondary | ICD-10-CM

## 2022-05-06 MED ORDER — LIDOCAINE HCL 1 % IJ SOLN
2.0000 mL | INTRAMUSCULAR | Status: AC | PRN
  Administered 2022-05-06: 2 mL

## 2022-05-06 MED ORDER — BUPIVACAINE HCL 0.25 % IJ SOLN
2.0000 mL | INTRAMUSCULAR | Status: AC | PRN
  Administered 2022-05-06: 2 mL via INTRA_ARTICULAR

## 2022-05-06 MED ORDER — METHYLPREDNISOLONE ACETATE 40 MG/ML IJ SUSP
80.0000 mg | INTRAMUSCULAR | Status: AC | PRN
  Administered 2022-05-06: 80 mg via INTRA_ARTICULAR

## 2022-05-06 NOTE — Progress Notes (Signed)
Office Visit Note   Patient: [name]           Date of Birth: 04-20-1951           MRN: [id number] Visit Date: 05/06/2022              Requested by: Cathlean Sauer, [address] [place],  [place] [address] PCP: Cathlean Sauer, MD   Assessment & Plan: Visit Diagnoses:  1. Pain in left elbow   2. Bilateral primary osteoarthritis of knee     Plan: [name] is a visit to the office relating a recurrent history of bilateral knee pain but predominantly in the right knee.  [name] is very active in tennis and finds that would prolong periods of time in her feet [name] is uncomfortable.  [name] has not had any sensation of her knee giving way or her significant swelling but mostly pain along the medial compartment.  X-rays today demonstrate significant tricompartmental changes in the right knee predominantly medially where there is narrowing of the joint space, subchondral sclerosis and about 4 degrees of varus.  Long discussion regarding her diagnosis and treatment options.  [name] is taking oral and time inflammatory meds on a as needed basis and using Voltaren gel.  We did briefly discussed knee replacement but do not think [name] is ready for that yet.  We will inject her knee with cortisone today  Follow-Up Instructions: Return if symptoms worsen or fail to improve.   Orders:  Orders Placed This Encounter  Procedures   XR KNEE 3 VIEW RIGHT   No orders of the defined types were placed in this encounter.     Procedures: Large Joint Inj: R knee on 05/06/2022 9:29 AM Indications: pain and diagnostic evaluation Details: 25 G 1.5 in needle, anteromedial approach  Arthrogram: No  Medications: 2 mL lidocaine 1 %; 80 mg methylPREDNISolone acetate 40 MG/ML; 2 mL bupivacaine 0.25 % Procedure, treatment alternatives, risks and benefits explained, specific risks discussed. Consent was given by the patient. Immediately prior to procedure a time out was called to verify the  correct patient, procedure, equipment, support staff and site/side marked as required. Patient was prepped and draped in the usual sterile fashion.      Clinical Data: No additional findings.   Subjective: Chief Complaint  Patient presents with   Right Knee - Pain  [name] relates  recurrent pain in her right knee especially after being on her feet for long periods of time or playing tennis.  Pain is mostly along the medial compartment.  [name] has tried over-the-counter anti-inflammatory medicines and even occasionally meloxicam as well as Voltaren gel.  [name] has a tennis tournament this coming weekend and would like to have a cortisone injection  HPI  Review of Systems   Objective: Vital Signs: There were no vitals taken for this visit.  Physical Exam Constitutional:      Appearance: [name] is well-developed.  Eyes:     Pupils: Pupils are equal, round, and reactive to light.  Pulmonary:     Effort: Pulmonary effort is normal.  Skin:    General: Skin is warm and dry.  Neurological:     Mental Status: [name] is alert and oriented to person, place, and time.  Psychiatric:        Behavior: Behavior normal.     Ortho Exam awake alert and oriented x3.  Comfortable sitting.  No acute distress.  Increased varus with weightbearing both knees.  Right knee was not hot warm or red.  The left knee was asymptomatic today.  Mostly medial joint pain right knee.  No instability.  Full extension flex at least to 100 degrees.  There was no opening with varus or valgus stress.  No popliteal pain or mass.  No calf pain.  Straight leg raise negative.  Specialty Comments:  No specialty comments available.  Imaging: XR KNEE 3 VIEW RIGHT  Result Date: 05/06/2022 Films of the right knee were obtained in 3 projections standing.  There is about 4 degrees of varus and significant narrowing of the medial compartment.  In the medial compartment there is subchondral sclerosis peripheral osteophytes and  irregularity of the joint line.  There are lesser changes in the lateral compartment and patellofemoral joint.  No acute changes.  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   Top of Left Hand(Tx p Bx)    Family History  Problem Relation Age of Onset   Uterine cancer Mother    Heart attack Mother    Hypertension Mother    Kidney cancer Father    Lung cancer Father    Diabetes Sister    Thyroid disease Neg Hx     Past Surgical History:  Procedure Laterality Date   CARPAL TUNNEL RELEASE     BILAT     CATARACT EXTRACTION     BILAT    ECTOPIC PREGNANCY SURGERY     EYE SURGERY     KNEE CARTILAGE SURGERY     RIGHT    (MENISCUS)   TONSILLECTOMY     TOTAL HIP ARTHROPLASTY Left 12/19/2015   TOTAL HIP ARTHROPLASTY Left 12/19/2015   Procedure: LEFT TOTAL HIP ARTHROPLASTY;  Surgeon: Garald Balding, MD;  Location: [facility];  Service: Orthopedics;  Laterality: Left;   TRIGGER FINGER RELEASE     TUBAL LIGATION     Social History   Occupational History   Not on file  Tobacco Use   Smoking status: Never   Smokeless tobacco: Never  Vaping Use   Vaping Use: Never used  Substance and Sexual Activity   Alcohol use: Yes    Alcohol/week: 0.0 standard drinks of alcohol    Comment: Rare    Drug use: No   Sexual activity: Yes    Birth control/protection: Surgical, Post-menopausal    Comment: Tubal lig-1st intercourse 71 yo-Fewer than 5 partners     Garald Balding, MD   Note - This record has been created using [employer].  Chart creation errors have been sought, but may not always  have been located. Such creation errors do not reflect on  the standard of medical care.

## 2022-08-05 ENCOUNTER — Ambulatory Visit: Payer: Medicare PPO | Admitting: Orthopaedic Surgery

## 2022-08-05 ENCOUNTER — Encounter: Payer: Self-pay | Admitting: Orthopaedic Surgery

## 2022-08-05 DIAGNOSIS — M1712 Unilateral primary osteoarthritis, left knee: Secondary | ICD-10-CM | POA: Diagnosis not present

## 2022-08-05 DIAGNOSIS — M1711 Unilateral primary osteoarthritis, right knee: Secondary | ICD-10-CM

## 2022-08-05 DIAGNOSIS — M25561 Pain in right knee: Secondary | ICD-10-CM | POA: Diagnosis not present

## 2022-08-05 DIAGNOSIS — M25562 Pain in left knee: Secondary | ICD-10-CM

## 2022-08-05 DIAGNOSIS — M170 Bilateral primary osteoarthritis of knee: Secondary | ICD-10-CM

## 2022-08-05 DIAGNOSIS — G8929 Other chronic pain: Secondary | ICD-10-CM

## 2022-08-05 MED ORDER — LIDOCAINE HCL 1 % IJ SOLN
3.0000 mL | INTRAMUSCULAR | Status: AC | PRN
  Administered 2022-08-05: 3 mL

## 2022-08-05 MED ORDER — METHYLPREDNISOLONE ACETATE 40 MG/ML IJ SUSP
40.0000 mg | INTRAMUSCULAR | Status: AC | PRN
  Administered 2022-08-05: 40 mg via INTRA_ARTICULAR

## 2022-08-05 NOTE — Progress Notes (Signed)
The patient is a 72 year old female who actually turns 72 next week.  She has bilateral knee pain with the right worse than left with known and well-documented arthritis in her knees.  She is a long-term patient of Dr. Durward Fortes.  She has a history of meniscus surgery and arthroscopy on the right knee in 2016.  She states that Dr. Durward Fortes did place a steroid injection back in November of both her knees and that did great for her in terms of her regionals and tenderness.  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  She is having stiffness in her knees.  She is not a diabetic and she is requesting steroid injections in both knees today.  She has had no acute change in her medical status.  Both knees were assessed today.  Neither has an effusion but both knees have varus malalignment that is correctable as well as medial joint line tenderness and patellofemoral crepitation.  Both knees are ligamentously stable.  I did place a steroid injection in both knees which she tolerated well.  Follow-up is as needed.  We can certainly at some point try hyaluronic acid as well.  All questions and concerns were answered and addressed.      Procedure Note  Patient: [name]             Date of Birth: [birth year]           MRN: [id number]             Visit Date: 08/05/2022  Procedures: Visit Diagnoses:  1. Chronic pain of left knee   2. Chronic pain of right knee   3. Unilateral primary osteoarthritis, left knee   4. Unilateral primary osteoarthritis, right knee     Large Joint Inj: R knee on 08/05/2022 4:07 PM Indications: diagnostic evaluation and pain Details: 22 G 1.5 in needle, superolateral approach  Arthrogram: No  Medications: 3 mL lidocaine 1 %; 40 mg methylPREDNISolone acetate 40 MG/ML Outcome: tolerated well, no immediate complications Procedure, treatment alternatives, risks and benefits explained, specific risks discussed. Consent was  given by the patient. Immediately prior to procedure a time out was called to verify the correct patient, procedure, equipment, support staff and site/side marked as required. Patient was prepped and draped in the usual sterile fashion.    Large Joint Inj: L knee on 08/05/2022 4:07 PM Indications: diagnostic evaluation and pain Details: 22 G 1.5 in needle, superolateral approach  Arthrogram: No  Medications: 3 mL lidocaine 1 %; 40 mg methylPREDNISolone acetate 40 MG/ML Outcome: tolerated well, no immediate complications Procedure, treatment alternatives, risks and benefits explained, specific risks discussed. Consent was given by the patient. Immediately prior to procedure a time out was called to verify the correct patient, procedure, equipment, support staff and site/side marked as required. Patient was prepped and draped in the usual sterile fashion.

## 2022-10-12 ENCOUNTER — Other Ambulatory Visit: Payer: Self-pay | Admitting: Internal Medicine

## 2022-10-23 ENCOUNTER — Emergency Department (HOSPITAL_BASED_OUTPATIENT_CLINIC_OR_DEPARTMENT_OTHER)
Admission: EM | Admit: 2022-10-23 | Discharge: 2022-10-23 | Disposition: A | Discharge status: Home / Self Care | Payer: Medicare PPO | Attending: Emergency Medicine | Admitting: Emergency Medicine

## 2022-10-23 ENCOUNTER — Ambulatory Visit: Payer: Medicare PPO | Admitting: Obstetrics & Gynecology

## 2022-10-23 ENCOUNTER — Other Ambulatory Visit: Payer: Self-pay

## 2022-10-23 ENCOUNTER — Telehealth: Payer: Self-pay

## 2022-10-23 ENCOUNTER — Other Ambulatory Visit (HOSPITAL_COMMUNITY)
Admission: RE | Admit: 2022-10-23 | Discharge: 2022-10-23 | Disposition: A | Discharge status: Home / Self Care | Payer: Medicare PPO | Source: Ambulatory Visit | Attending: Obstetrics & Gynecology | Admitting: Obstetrics & Gynecology

## 2022-10-23 ENCOUNTER — Encounter (HOSPITAL_BASED_OUTPATIENT_CLINIC_OR_DEPARTMENT_OTHER): Payer: Self-pay | Admitting: Emergency Medicine

## 2022-10-23 ENCOUNTER — Encounter: Payer: Self-pay | Admitting: Obstetrics & Gynecology

## 2022-10-23 ENCOUNTER — Other Ambulatory Visit: Payer: Self-pay | Admitting: Obstetrics & Gynecology

## 2022-10-23 ENCOUNTER — Ambulatory Visit (HOSPITAL_BASED_OUTPATIENT_CLINIC_OR_DEPARTMENT_OTHER)
Admission: RE | Admit: 2022-10-23 | Discharge: 2022-10-23 | Disposition: A | Discharge status: Home / Self Care | Payer: Medicare PPO | Source: Ambulatory Visit | Attending: Obstetrics & Gynecology | Admitting: Obstetrics & Gynecology

## 2022-10-23 VITALS — BP 122/76 | HR 84 | Ht 62.5 in | Wt 155.0 lb

## 2022-10-23 DIAGNOSIS — Z7901 Long term (current) use of anticoagulants: Secondary | ICD-10-CM | POA: Insufficient documentation

## 2022-10-23 DIAGNOSIS — R591 Generalized enlarged lymph nodes: Secondary | ICD-10-CM | POA: Insufficient documentation

## 2022-10-23 DIAGNOSIS — I82401 Acute embolism and thrombosis of unspecified deep veins of right lower extremity: Secondary | ICD-10-CM | POA: Insufficient documentation

## 2022-10-23 DIAGNOSIS — Z780 Asymptomatic menopausal state: Secondary | ICD-10-CM

## 2022-10-23 DIAGNOSIS — R590 Localized enlarged lymph nodes: Secondary | ICD-10-CM

## 2022-10-23 DIAGNOSIS — Z01419 Encounter for gynecological examination (general) (routine) without abnormal findings: Secondary | ICD-10-CM | POA: Insufficient documentation

## 2022-10-23 DIAGNOSIS — R600 Localized edema: Secondary | ICD-10-CM

## 2022-10-23 DIAGNOSIS — M7989 Other specified soft tissue disorders: Secondary | ICD-10-CM | POA: Diagnosis present

## 2022-10-23 DIAGNOSIS — I824Y1 Acute embolism and thrombosis of unspecified deep veins of right proximal lower extremity: Secondary | ICD-10-CM

## 2022-10-23 LAB — BASIC METABOLIC PANEL
Anion gap: 8 (ref 5–15)
BUN: 15 mg/dL (ref 8–23)
CO2: 28 mmol/L (ref 22–32)
Calcium: 9.7 mg/dL (ref 8.9–10.3)
Chloride: 102 mmol/L (ref 98–111)
Creatinine, Ser: 0.84 mg/dL (ref 0.44–1.00)
GFR, Estimated: >60 mL/min (ref >60)
Glucose, Bld: 97 mg/dL (ref 70–99)
Potassium: 3.9 mmol/L (ref 3.5–5.1)
Sodium: 138 mmol/L (ref 135–145)

## 2022-10-23 LAB — CBC
HCT: 36.9 % (ref 36.0–46.0)
Hemoglobin: 12.1 g/dL (ref 12.0–15.0)
MCH: 29.7 pg (ref 26.0–34.0)
MCHC: 32.8 g/dL (ref 30.0–36.0)
MCV: 90.7 fL (ref 80.0–100.0)
Platelets: 255 10*3/uL (ref 150–400)
RBC: 4.07 MIL/uL (ref 3.87–5.11)
RDW: 12.6 % (ref 11.5–15.5)
WBC: 5.1 10*3/uL (ref 4.0–10.5)
nRBC: 0 % (ref 0.0–0.2)

## 2022-10-23 MED ORDER — APIXABAN (ELIQUIS) EDUCATION KIT FOR DVT/PE PATIENTS: PACK | Freq: Once | Status: AC

## 2022-10-23 MED ORDER — APIXABAN 2.5 MG PO TABS
10.0000 mg | ORAL_TABLET | Freq: Once | ORAL | Status: AC
  Administered 2022-10-23: 10 mg via ORAL
  Filled 2022-10-23: qty 4

## 2022-10-23 MED ORDER — APIXABAN (ELIQUIS) VTE STARTER PACK (10MG AND 5MG)
ORAL_TABLET | ORAL | 0 refills | Status: DC
Start: 2022-10-23 — End: 2022-10-23
  Filled 2022-10-23: qty 74, fill #0

## 2022-10-23 MED ORDER — APIXABAN (ELIQUIS) VTE STARTER PACK (10MG AND 5MG): ORAL_TABLET | ORAL | 0 refills | Status: DC

## 2022-10-23 MED ORDER — APIXABAN (ELIQUIS) VTE STARTER PACK (10MG AND 5MG)
ORAL_TABLET | ORAL | 0 refills | Status: DC
Start: 2022-10-23 — End: 2022-12-21

## 2022-10-23 NOTE — ED Provider Notes (Signed)
[place] EMERGENCY DEPARTMENT AT [facility] Provider Note   CSN: [id number] Arrival date & time: 10/23/22  2054     History  Chief Complaint  Patient presents with   Leg Swelling    [name] is a 72 y.o. female presenting today with a known DVT.  Patient has had multiple weeks worth of right thigh swelling.  Not very painful.  She has been evaluated by PCP and orthopedics and her swelling has not been improving.  She saw her OB who scheduled her for an outpatient Doppler.  This was positive.  I spoke personally with her OB/GYN and instructed the patient to check in  No history of DVT/PE, does not smoke, no recent travel or surgery and no estrogen products. HPI     Home Medications Prior to Admission medications   Medication Sig Start Date End Date Taking? Authorizing Provider  APIXABAN Everlene Balls) VTE STARTER PACK (10MG  AND 5MG ) Take as directed on package: start with two-5mg  tablets twice daily for 7 days. On day 8, switch to one-5mg  tablet twice daily. 10/23/22  Yes Cait Locust A, PA-C  Calcium Carb-Cholecalciferol (CALCIUM 1000 + D PO) Take by mouth.    [provider]  diclofenac sodium (VOLTAREN) 1 % GEL Apply 2-4 g topically 4 (four) times daily. 04/19/18   Jetty Peeks, PA-C  ferrous sulfate 325 (65 FE) MG EC tablet Take 325 mg by mouth at bedtime.    [provider]  Glucosamine 500 MG CAPS 1 capsule Orally twice a day    [provider]  Glucosamine-Chondroit-Vit C-Mn (GLUCOSAMINE CHONDR 1500 COMPLX PO) Take by mouth.    [provider]  levothyroxine (SYNTHROID) 75 MCG tablet TAKE ONE (1) TABLET BY MOUTH EVERY DAY 10/12/22   Carlus Pavlov, MD  meloxicam (MOBIC) 15 MG tablet TAKE ONE (1) TABLET BY MOUTH EACH DAY 11/04/21   [provider]  Multiple Vitamin (MULTIVITAMIN) tablet Take 1 tablet by mouth daily.    [provider]  Turmeric 400 MG CAPS Take by mouth. 09/11/21   [provider]      Allergies    Sulfa antibiotics and Motrin [ibuprofen]    Review of Systems   Review of Systems  Physical Exam Updated Vital Signs BP 139/75   Pulse (!) 56   Temp 98.3 F (36.8 C) (Oral)   Resp 16   Wt 70.3 kg   SpO2 100%   BMI 27.90 kg/m  Physical Exam Vitals and nursing note reviewed.  Constitutional:      Appearance: Normal appearance.  HENT:     Head: Normocephalic and atraumatic.  Eyes:     General: No scleral icterus.    Conjunctiva/sclera: Conjunctivae normal.  Pulmonary:     Effort: Pulmonary effort is normal. No respiratory distress.  Musculoskeletal:     Comments: Full range of motion of bilateral lower extremities.  Ambulatory.  2+ DP pulse  Skin:    Findings: No rash.  Neurological:     Mental Status: She is alert.  Psychiatric:        Mood and Affect: Mood normal.     ED Results / Procedures / Treatments   Labs (all labs ordered are listed, but only abnormal results are displayed) Labs Reviewed  BASIC METABOLIC PANEL  CBC    EKG None  Radiology US Venous Img Lower Unilateral Right (DVT)  Result Date: 10/23/2022 CLINICAL DATA:  Lymphadenopathy and edema of right thigh for 3.5 weeks EXAM: RIGHT LOWER  EXTREMITY VENOUS DOPPLER ULTRASOUND TECHNIQUE: Gray-scale sonography with compression, as well as color and duplex ultrasound, were performed to evaluate the deep venous system(s) from the level of the common femoral vein through the popliteal and proximal calf veins. COMPARISON:  None Available. FINDINGS: VENOUS Nonocclusive thrombus of the right common femoral vein and the saphenofemoral junction. No other filling defects to suggest DVT on grayscale or color Doppler imaging. Doppler waveforms show normal direction of venous flow, normal respiratory plasticity and response to augmentation. Limited views of the contralateral common femoral vein are unremarkable. OTHER Multiple prominent palpable lymph nodes in the right groin measuring  up to 2.1 x 1.4 x 0.9 cm. Small fluid collection in the right popliteal fossa characteristic in appearance and location for a Baker's cyst measuring 2.4 x 1.5 x 0.6 cm. Limitations: none IMPRESSION: 1. 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 2. Multiple prominent palpable lymph nodes in the right groin measuring up to 2.1 cm, nonspecific. 3. Small right popliteal fossa Baker's cyst. These results will be called to the ordering clinician or representative by the Radiologist Assistant, and communication documented in the PACS or [employer]. Electronically Signed   By: Jearld Lesch M.D.   On: 10/23/2022 20:19    Procedures Procedures   Medications Ordered in ED Medications  apixaban (ELIQUIS) tablet 10 mg (has no administration in time range)  apixaban (ELIQUIS) Education Kit for DVT/PE patients (has no administration in time range)    ED Course/ Medical Decision Making/ A&P                             Medical Decision Making Amount and/or Complexity of Data Reviewed Labs: ordered.  Risk Prescription drug management.   Imaging: Outpatient DVT study positive for DVT in the femoral vein  Treatment: First dose of Eliquis  Physical exam: Neurovascularly intact, very subtle swelling of the right thigh when compared to the left  MDM/disposition: 72 year old female presenting today with a known DVT.  No history of GI bleed.  She is on meloxicam for knee injury.  Will encourage her to stop taking any NSAIDs and switch to Tylenol.  Will be started on Eliquis.  Educated on side effects of this medication and reasons to present to the ED.  Patient is agreeable to the plan and will be discharged with close PCP follow-up  Final Clinical Impression(s) / ED Diagnoses Final diagnoses:  Acute deep vein thrombosis (DVT) of proximal vein of right lower extremity (HCC)    Rx / DC Orders ED Discharge Orders          Ordered    APIXABAN (ELIQUIS) VTE  STARTER PACK (10MG  AND 5MG )        10/23/22 2205           Results and diagnoses were explained to the patient. Return precautions discussed in full. Patient had no additional questions and expressed complete understanding.   This chart was dictated using voice recognition software.  Despite best efforts to proofread,  errors can occur which can change the documentation meaning.    Woodroe Chen 10/23/22 2325    Maia Plan, MD 10/28/22 [phone]

## 2022-10-23 NOTE — Progress Notes (Signed)
[name] [birth year] [id number]   History:    72 y.o. [id number] Married  RP:  New (>3 years) patient presenting for annual gyn exam and Rt thigh swollen with a knot at the Rt inguinal area  HPI: Postmenopause on no HRT.  No PMB.  C/O experiencing hot flushes recently after many years of not having any menopausal Sx.  Plays tennis regularly and nonetheless has gained some weight especially at the lower abdomen.  Some discomfort and tenderness especially at the Rt lower abdomen.  Felt a knot yesterday at the Rt lower abdominal wall/inguinal area.  Reports having her Rt leg swollen x about 3 weeks, noticed after a tennis match.  Saw a Fam PA who requested a Rt lower limb Arterial Doppler which was negative, but NO VENOUS Doppler done.  She feels that her Rt thigh is still swollen.  Pap Neg 10/2014.  No h/o abnormal Pap.  Pap reflex today.  Breasts normal.  Mammo Neg 05/2017.  Will schedule Mammo.  Needs a Colono and a repeat BD. BMI 27.9.  Health labs with Fam MD.  Past medical history,surgical history, family history and social history were all reviewed and documented in the EPIC chart.  Gynecologic History No LMP recorded. Patient is postmenopausal.  Obstetric History OB History  Gravida Para Term Preterm AB Living  3 2 2   1 2   SAB IAB Ectopic Multiple Live Births      1        # Outcome Date GA Lbr Len/2nd Weight Sex Delivery Anes PTL Lv  3 Ectopic           2 Term           1 Term              ROS: A ROS was performed and pertinent positives and negatives are included in the history. GENERAL: No fevers or chills. HEENT: No change in vision, no earache, sore throat or sinus congestion. NECK: No pain or stiffness. CARDIOVASCULAR: No chest pain or pressure. No palpitations. PULMONARY: No shortness of breath, cough or wheeze. GASTROINTESTINAL: No abdominal pain, nausea, vomiting or diarrhea, melena or bright red blood per rectum. GENITOURINARY: No urinary frequency, urgency,  hesitancy or dysuria. MUSCULOSKELETAL: No joint or muscle pain, no back pain, no recent trauma. DERMATOLOGIC: No rash, no itching, no lesions. ENDOCRINE: No polyuria, polydipsia, no heat or cold intolerance. No recent change in weight. HEMATOLOGICAL: No anemia or easy bruising or bleeding. NEUROLOGIC: No headache, seizures, numbness, tingling or weakness. PSYCHIATRIC: No depression, no loss of interest in normal activity or change in sleep pattern.     Exam:   BP 122/76 (BP Location: Right Arm, Patient Position: Sitting, Cuff Size: Normal)   Pulse 84   Ht 5' 2.5" (1.588 m)   Wt 155 lb (70.3 kg)   SpO2 96%   BMI 27.90 kg/m   Body mass index is 27.9 kg/m.  General appearance : Well developed well nourished female. No acute distress HEENT: Eyes: no retinal hemorrhage or exudates,  Neck supple, trachea midline, no carotid bruits, no thyroidmegaly Lungs: Clear to auscultation, no rhonchi or wheezes, or rib retractions  Heart: Regular rate and rhythm, no murmurs or gallops Breast:Examined in sitting and supine position were symmetrical in appearance, no palpable masses or tenderness,  no skin retraction, no nipple inversion, no nipple discharge, no skin discoloration, no axillary or supraclavicular lymphadenopathy Abdomen: No palpable masses or tenderness, no rebound or guarding.  Rt inguinal  area with multiple masses c/w a lymphadenopathy.  Lt inguinal area normal. Extremities: Rt thigh tight skin and larger circumference than the Lt thigh, mildly tender.  No ankle edema or skin discoloration.  No tenderness bilaterally below the knees.  Pelvic: Vulva: Normal             Vagina: No gross lesions or discharge  Cervix: No gross lesions or discharge.  Pap reflex done.  Uterus  AV, normal size, shape and consistency, non-tender and mobile  Adnexa  Without masses or tenderness  Anus: Normal   Assessment/Plan:  72 y.o. female for annual exam   1. Encounter for routine gynecological  examination with Papanicolaou smear of cervix Postmenopause on no HRT.  No PMB.  C/O experiencing hot flushes recently after many years of not having any menopausal Sx.  Plays tennis regularly and nonetheless has gained some weight especially at the lower abdomen.  Some discomfort and tenderness especially at the Rt lower abdomen.  Felt a knot yesterday at the Rt lower abdominal wall/inguinal area.  Reports having her Rt leg swollen x about 3 weeks, noticed after a tennis match.  Saw a Fam PA who requested a Rt lower limb Arterial Doppler which was negative, but NO VENOUS Doppler done.  She feels that her Rt thigh is still swollen.  Pap Neg 10/2014.  No h/o abnormal Pap.  Pap reflex today.  Breasts normal.  Mammo Neg 05/2017.  Will schedule Mammo.  Needs a Colono and a repeat BD. BMI 27.9.  Health labs with Fam MD.- Cytology - PAP( [place]) CBC wnl  2. Postmenopause Postmenopause on no HRT.  No PMB.  C/O experiencing hot flushes recently after many years of not having any menopausal Sx.  TSH normal at 1.608 on 10/02/22.  Will reevaluate the issue after investigating the Rt inguinal lymphadenopathy/Rt thigh oedema.  3. Rt Inguinal lymphadenopathy/Rt thigh oedema C/O experiencing hot flushes recently after many years of not having any menopausal Sx.  Plays tennis regularly and nonetheless has gained some weight especially at the lower abdomen.  Some discomfort and tenderness especially at the Rt lower abdomen.  Felt a knot yesterday at the Rt lower abdominal wall/inguinal area.  Reports having her Rt leg swollen x about 3 weeks, noticed after a tennis match.  Saw a Fam PA who requested a Rt lower limb Arterial Doppler which was negative, but NO VENOUS Doppler done.  She feels that her Rt thigh is still swollen.  On exam, severe Rt inguinal lymphadenopathy with Rt thigh oedema.  Will investigate urgently with a CT scan of abdomen and pelvis with contrast.  Rt Venous Doppler of Rt Iliac/Femoral veins/Rt lower  limb.  Other orders - Glucosamine 500 MG CAPS; 1 capsule Orally twice a day - Turmeric 400 MG CAPS; Take by mouth.   [name] MD, 4:03 PM

## 2022-10-23 NOTE — Telephone Encounter (Addendum)
Pt scheduled for Venous Doppler US today @ 730 pm @ [facility]  Scheduled for CT of abdomen/pelvis on Sunday @ 4pm.  Pt notified and voiced understanding.   Now contacting insurance for PA if needed.  Spoke w/ [facility] Medicare and provided them with office information. However, wasn't able to complete PA clinical questions due to not being confident with the answers. [name] reports that he will send PA by fax.   Ref# [id number]  Their fax# is [phone]

## 2022-10-23 NOTE — Telephone Encounter (Signed)
-----   Message from [name], MD sent at 10/23/2022  3:07 PM EDT ----- Regarding: Urgent CT scan of Abdomen and Pelvis with contrast and Venous Doppler of Rt lower limb including Rt iliac/femoral veins Rt lymphadenopathy with oedema of the Rt thigh.  R/O Cancer.

## 2022-10-23 NOTE — ED Triage Notes (Signed)
Pt in with R thigh swelling, ongoing x a few weeks. Pt has had evaluations from multiple specialists, most recently seen by her OB today. Recent dopplers show concern for blood clots and lymphadenopathy of R thigh

## 2022-10-23 NOTE — Discharge Instructions (Addendum)
You came to the emergency department today due to an abnormal ultrasound.  You have a DVT (blood clot) in your right thigh.  You are being started on a blood thinner.  Remember, please stop taking the meloxicam.  He should not be taking any NSAID drugs.  You may switch to Tylenol for your knee pain  Please make an appointment with your primary care doctor to decide how long you are going to be on blood thinners.  It was a pleasure to meet you and we hope you feel better.  Please return with any palpitations, shortness of breath, chest pain, dizziness or feelings as though you may lose consciousness.

## 2022-10-24 ENCOUNTER — Other Ambulatory Visit (HOSPITAL_BASED_OUTPATIENT_CLINIC_OR_DEPARTMENT_OTHER): Payer: Self-pay

## 2022-10-25 ENCOUNTER — Ambulatory Visit (HOSPITAL_BASED_OUTPATIENT_CLINIC_OR_DEPARTMENT_OTHER)
Admission: RE | Admit: 2022-10-25 | Discharge: 2022-10-25 | Disposition: A | Discharge status: Home / Self Care | Payer: Medicare PPO | Source: Ambulatory Visit | Attending: Obstetrics & Gynecology | Admitting: Obstetrics & Gynecology

## 2022-10-25 DIAGNOSIS — R591 Generalized enlarged lymph nodes: Secondary | ICD-10-CM | POA: Diagnosis present

## 2022-10-25 DIAGNOSIS — R600 Localized edema: Secondary | ICD-10-CM | POA: Diagnosis present

## 2022-10-25 MED ORDER — IOHEXOL 300 MG/ML  SOLN
100.0000 mL | Freq: Once | INTRAMUSCULAR | Status: AC | PRN
  Administered 2022-10-25: 80 mL via INTRAVENOUS

## 2022-10-26 ENCOUNTER — Other Ambulatory Visit: Payer: Medicare PPO

## 2022-10-26 ENCOUNTER — Telehealth: Payer: Self-pay

## 2022-10-26 DIAGNOSIS — R590 Localized enlarged lymph nodes: Secondary | ICD-10-CM

## 2022-10-26 NOTE — Telephone Encounter (Signed)
[name] spoke with Dr. Mackey Birchwood and patient is aware of this referral.  Also, referral is to GYN Onc.   Referral order placed.

## 2022-10-26 NOTE — Telephone Encounter (Signed)
Refer to Oncology urgently.  Pelvic lymphadenopathy (largest at 5.6 cm at the Rt iliac chain) and prominent Rt inguinal LNs.  Metastatic lymphadenopathy or lymphoproliferative disease cannot be excluded. Rt common femoral vein thrombosis.  Started on Anticoagulation.   CT scan:  10/25/22 1. Pelvic lymphadenopathy with the most prominent lymph node along the right iliac chain measuring 5.6 x 2.8 cm. Metastatic lymphadenopathy or lymphoproliferative disease cannot be excluded. 2. 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 3. Prominent right inguinal lymph nodes. 4. 8 mm sclerotic bone lesion within the right ischium, indeterminate. 5. Aortic atherosclerosis.

## 2022-10-26 NOTE — Telephone Encounter (Signed)
Opened in error. Forwarded to [employer]. Will close this encounter.

## 2022-10-26 NOTE — Telephone Encounter (Addendum)
Dr. Veneda Melter have informed patient regarding this referral?  Just wanted to confirm before I place referral order.  Also, will this be referral to surgical oncology? We have to pick type of onc on the referral order.

## 2022-10-26 NOTE — Telephone Encounter (Signed)
-----   Message from [name], MD sent at 10/26/2022  9:32 AM EDT ----- Regarding: Referral to Oncology As we refer patient to Oncology asap, could you check if they can do a Lymph Node biopsy or if we need to refer her to General Surgery as well?  Thanks

## 2022-10-26 NOTE — Telephone Encounter (Signed)
I spoke with [name] at [facility] and posed Dr. Simon Rhein question: "As we refer patient to Oncology asap, could you check if they can do a Lymph Node biopsy or if we need to refer her to General Surgery as well?  Thanks "  She said the Gyn Oncologists do lymph node biopsy. If they cannot they will refer patient to [facility].  Dr. Mackey Birchwood was verbally informed of this in conversation.

## 2022-10-27 ENCOUNTER — Telehealth: Payer: Self-pay | Admitting: *Deleted

## 2022-10-27 LAB — CYTOLOGY - PAP: Diagnosis: NEGATIVE

## 2022-10-27 NOTE — Telephone Encounter (Signed)
Appt scheduled with Dr. Pricilla Holm 11/06/22 at 11:15am. This was first available. They will watch for cancellation and call her is sooner available.

## 2022-10-27 NOTE — Telephone Encounter (Signed)
Spoke with the patient regarding the referral to GYN oncology. Patient scheduled as new patient with Dr Pricilla Holm 5/10 at 11:15 am.  Patient given an arrival time of 10:45 am.  Explained to the patient the the doctor will perform a pelvic exam at this visit. Patient given the policy that only one visitor allowed and that visitor must be over 16 yrs are allowed in the Cancer Center. Patient given the address/phone number for the clinic and that the center offers free valet service. Patient aware that masks are option.

## 2022-10-30 ENCOUNTER — Encounter: Payer: Self-pay | Admitting: Gynecologic Oncology

## 2022-11-02 ENCOUNTER — Other Ambulatory Visit: Payer: Self-pay

## 2022-11-02 ENCOUNTER — Inpatient Hospital Stay: Payer: Medicare PPO | Attending: Gynecologic Oncology | Admitting: Psychiatry

## 2022-11-02 ENCOUNTER — Encounter: Payer: Self-pay | Admitting: Psychiatry

## 2022-11-02 ENCOUNTER — Inpatient Hospital Stay: Payer: Medicare PPO

## 2022-11-02 VITALS — BP 132/88 | HR 65 | Temp 98.5°F | Resp 16 | Wt 151.8 lb

## 2022-11-02 DIAGNOSIS — C778 Secondary and unspecified malignant neoplasm of lymph nodes of multiple regions: Secondary | ICD-10-CM | POA: Diagnosis not present

## 2022-11-02 DIAGNOSIS — I82411 Acute embolism and thrombosis of right femoral vein: Secondary | ICD-10-CM | POA: Insufficient documentation

## 2022-11-02 DIAGNOSIS — E039 Hypothyroidism, unspecified: Secondary | ICD-10-CM | POA: Insufficient documentation

## 2022-11-02 DIAGNOSIS — Z7901 Long term (current) use of anticoagulants: Secondary | ICD-10-CM | POA: Insufficient documentation

## 2022-11-02 DIAGNOSIS — Z801 Family history of malignant neoplasm of trachea, bronchus and lung: Secondary | ICD-10-CM | POA: Insufficient documentation

## 2022-11-02 DIAGNOSIS — L858 Other specified epidermal thickening: Secondary | ICD-10-CM | POA: Insufficient documentation

## 2022-11-02 DIAGNOSIS — R590 Localized enlarged lymph nodes: Secondary | ICD-10-CM

## 2022-11-02 DIAGNOSIS — Z8051 Family history of malignant neoplasm of kidney: Secondary | ICD-10-CM | POA: Insufficient documentation

## 2022-11-02 DIAGNOSIS — R319 Hematuria, unspecified: Secondary | ICD-10-CM | POA: Diagnosis not present

## 2022-11-02 DIAGNOSIS — C801 Malignant (primary) neoplasm, unspecified: Secondary | ICD-10-CM | POA: Insufficient documentation

## 2022-11-02 DIAGNOSIS — Z85828 Personal history of other malignant neoplasm of skin: Secondary | ICD-10-CM | POA: Diagnosis not present

## 2022-11-02 DIAGNOSIS — Z8049 Family history of malignant neoplasm of other genital organs: Secondary | ICD-10-CM | POA: Diagnosis not present

## 2022-11-02 DIAGNOSIS — M5136 Other intervertebral disc degeneration, lumbar region: Secondary | ICD-10-CM | POA: Diagnosis not present

## 2022-11-02 DIAGNOSIS — M1990 Unspecified osteoarthritis, unspecified site: Secondary | ICD-10-CM | POA: Insufficient documentation

## 2022-11-02 DIAGNOSIS — R591 Generalized enlarged lymph nodes: Secondary | ICD-10-CM | POA: Insufficient documentation

## 2022-11-02 LAB — URINALYSIS, COMPLETE (UACMP) WITH MICROSCOPIC
Bacteria, UA: NONE SEEN
Bilirubin Urine: NEGATIVE
Glucose, UA: NEGATIVE mg/dL
Hgb urine dipstick: NEGATIVE
Ketones, ur: NEGATIVE mg/dL
Leukocytes,Ua: NEGATIVE
Nitrite: NEGATIVE
Protein, ur: NEGATIVE mg/dL
Specific Gravity, Urine: 1.012 (ref 1.005–1.030)
pH: 7 (ref 5.0–8.0)

## 2022-11-02 NOTE — Patient Instructions (Signed)
It was a pleasure to see you in clinic today. - I did not see anything on exam that clearly explains the reason for your enlarged lymph nodes. - We will get a pet scan and a biopsy of one of the lymph nodes - Return visit planned for after the pet scan and lymph node biopsy - I will let you know if your urine shows anything  Thank you very much for allowing me to provide care for you today.  I appreciate your confidence in choosing our Gynecologic Oncology team at [facility].  If you have any questions about your visit today please call our office or send [place] a MyChart message and we will get back to you as soon as possible.

## 2022-11-02 NOTE — Progress Notes (Signed)
GYNECOLOGIC ONCOLOGY NEW PATIENT CONSULTATION  Date of Service: 11/02/2022 Referring Provider: [name], MD   ASSESSMENT AND PLAN: [name] is a 72 y.o. woman with right inguinal and pelvic lymphadenopathy of unclear origin.  Reviewed imaging results in detail with patient.  Thorough exam of bilateral lower extremities, vulva, vagina performed today without clear source for lymphadenopathy.  Given this, recommend additional imaging with a PET/CT as well as a VIR biopsy of either her palpable right inguinal lymph node or her enlarged pelvic lymph node.  Additionally, patient with questionable hematuria since starting her Eliquis, although she primarily describes the urine as dark and not necessarily bloody.  Urinalysis performed today and completely reassuring.  PET scan scheduled for 11/16/2022.  Patient will get a call from VIR to schedule lymph node biopsy.  Plan for patient to follow-up with me after the studies performed to discuss results and next steps as indicated.  A copy of this note was sent to the patient's referring provider.  [name], MD Gynecologic Oncology   Medical Decision Making I personally spent  TOTAL 50 minutes face-to-face and non-face-to-face in the care of this patient, which includes all pre, intra, and post visit time on the date of service.   ------------  CC: Inguinal and pelvic lymphadenopathy  HISTORY OF PRESENT ILLNESS:  [name] is a 72 y.o. woman who is seen in consultation at the request of [name], MD for evaluation of lymphadenopathy.  Patient presented to her OB/GYN on 10/23/2022 for an annual exam.  At that time she noted right swelling and a knot in her right groin.  At that time she reported approximately 3 weeks of leg swelling.  A Pap smear was collected at that time and returned NILM.  Today, patient reports that the swelling of her right inner thigh started on April 3.  She has only  noticed erythema in this area for the last 2 weeks.  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  She was started on Eliquis and is currently on 5 mg twice daily.  She follows closely with a dermatologist who she sees annually in the summer with her next visit in July.  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  She otherwise reports weight gain.  She also notes hot flashes that have returned in the past year.  She has not been on hormone since her mid 46s.  Since starting the Eliquis she reports 3 episodes of "dark urine".  She also reports that on those few occasions it has felt that the end of her urinary stream was "tight" but not quite as staying like a UTI.  Following this she would drink more water which seemed to help.  She otherwise denies abdominal bloating, early satiety, significant weight loss, change in bowel habits.   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   Top of Left Hand(Tx p Bx)  PAST SURGICAL HISTORY: Past Surgical History:  Procedure Laterality Date   CARPAL TUNNEL RELEASE     BILAT     CATARACT EXTRACTION     BILAT    ECTOPIC PREGNANCY SURGERY     in the setting of prior BTL, lapartomy   EYE SURGERY     KNEE CARTILAGE SURGERY     RIGHT    (MENISCUS)   MOHS SURGERY     left nose/inner eye for basal cell   TONSILLECTOMY     TOTAL HIP ARTHROPLASTY Left 12/19/2015   TOTAL HIP ARTHROPLASTY Left 12/19/2015    Procedure: LEFT TOTAL HIP ARTHROPLASTY;  Surgeon: Valeria Batman, MD;  Location: [facility] OR;  Service: Orthopedics;  Laterality: Left;   TRIGGER FINGER RELEASE     TUBAL LIGATION      OB/GYN HISTORY: OB History  Gravida Para Term Preterm AB Living  3 2 2   1 2   SAB IAB Ectopic Multiple Live Births      1   2    # Outcome Date GA Lbr Len/2nd Weight Sex Delivery Anes PTL Lv  3 Ectopic           2 Term      Vag-Spont   LIV  1 Term      Vag-Spont   LIV      Age at menarche: 77 Age at menopause: 52 Hx of HRT: Yes, from heart for  5 years, discontinued around mid 49s Hx of STI: No Last pap: 10/23/22 NILM History of abnormal pap smears: none  SCREENING STUDIES:  Last mammogram: 11/2021 Last colonoscopy: 2021  MEDICATIONS:  Current Outpatient Medications:    APIXABAN (ELIQUIS) VTE STARTER PACK (10MG  AND 5MG ), Take as directed on package: start with two-5mg  tablets twice daily for 7 days. On day 8, switch to one-5mg  tablet twice daily., Disp: 1 each, Rfl: 0   Calcium Carb-Cholecalciferol (CALCIUM 1000 + D PO), Take by mouth., Disp: , Rfl:    diclofenac sodium (VOLTAREN) 1 % GEL, Apply 2-4 g topically 4 (four) times daily., Disp: 3 Tube, Rfl: 3   ferrous sulfate 325 (65 FE) MG EC tablet, Take 325 mg by mouth at bedtime., Disp: , Rfl:    Glucosamine 500 MG CAPS, 1 capsule Orally twice a day, Disp: , Rfl:    Glucosamine-Chondroit-Vit C-Mn (GLUCOSAMINE CHONDR 1500 COMPLX PO), Take by mouth., Disp: , Rfl:    levothyroxine (SYNTHROID) 75 MCG tablet, TAKE ONE (1) TABLET BY MOUTH EVERY DAY, Disp: 45 tablet, Rfl: 2   Multiple Vitamin (MULTIVITAMIN) tablet, Take 1 tablet by mouth daily., Disp: , Rfl:    Turmeric 400 MG CAPS, Take by mouth., Disp: , Rfl:   ALLERGIES: Allergies  Allergen Reactions   Sulfa Antibiotics Swelling    FAMILY HISTORY: Family History  Problem Relation Age of Onset   Uterine cancer Mother    Heart attack Mother    Hypertension Mother    Kidney cancer Father     Lung cancer Father    Diabetes Sister    Thyroid disease Neg Hx    Colon cancer Neg Hx    Breast cancer Neg Hx    Ovarian cancer Neg Hx    Endometrial cancer Neg Hx    Pancreatic cancer Neg Hx    Prostate cancer Neg Hx     SOCIAL HISTORY: Social History   Socioeconomic History   Marital status: Married    Spouse name: Not on file   Number of children: Not  on file   Years of education: Not on file   Highest education level: Not on file  Occupational History   Not on file  Tobacco Use   Smoking status: Never   Smokeless tobacco: Never  Vaping Use   Vaping Use: Never used  Substance and Sexual Activity   Alcohol use: Yes    Alcohol/week: 0.0 standard drinks of alcohol    Comment: Rare   Drug use: No   Sexual activity: Yes    Birth control/protection: Surgical, Post-menopausal    Comment: Tubal lig-1st intercourse 72 yo-Fewer than 5 partners  Other Topics Concern   Not on file  Social History Narrative   Not on file   Social Determinants of Health   Financial Resource Strain: Not on file  Food Insecurity: No Food Insecurity (10/30/2022)   Hunger Vital Sign    Worried About Running Out of Food in the Last Year: Never true    Ran Out of Food in the Last Year: Never true  Transportation Needs: Not on file  Physical Activity: Not on file  Stress: Not on file  Social Connections: Not on file  Intimate Partner Violence: Not At Risk (10/30/2022)   Humiliation, Afraid, Rape, and Kick questionnaire    Fear of Current or Ex-Partner: No    Emotionally Abused: No    Physically Abused: No    Sexually Abused: No    REVIEW OF SYSTEMS: New patient intake form was reviewed.  Complete 10-system review is negative except for the following: Blood in urine/dark urine, pelvic soreness, swelling of right thigh, swollen lymph node  PHYSICAL EXAM: BP 132/88 (BP Location: Right Arm)   Pulse 65   Temp 98.5 F (36.9 C) (Oral)   Resp 16   Wt 151 lb 12.8 oz (68.9 kg)   SpO2 100%   BMI  27.32 kg/m  Constitutional: No acute distress. Neuro/Psych: Alert, oriented.  Head and Neck: Normocephalic, atraumatic. Neck symmetric without masses. Sclera anicteric.  Respiratory: Normal work of breathing. Clear to auscultation bilaterally. Cardiovascular: Regular rate and rhythm, no murmurs, rubs, or gallops. Abdomen: Normoactive bowel sounds. Soft, non-distended, non-tender to palpation. No masses appreciated. No evidence of hernia.  Well-healed Pfannenstiel incision. Extremities: Grossly normal range of motion. Warm, well perfused. No edema bilaterally. Skin: Skin exam performed of bilateral lower extremities without overt concerning lesion apart from one raised pink scaly lesion on the right anterior medial lower leg, see picture below.  Erythema and swelling of the upper inner right thigh Lymphatic: No cervical, supraclavicular adenopathy.  Enlarged palpable lymph nodes in the right groin.  Shotty lymph nodes in the left groin Genitourinary: Mild swelling and minimal erythema overlying the mons.  Otherwise no lesions of the external genitalia.  Urethral meatus with caruncle.  On speculum exam, vagina and cervix without lesions. Bimanual exam reveals normal cervix and mobile uterus.  Exam chaperoned by Warner Mccreedy, NP    VULVAR COLPOSCOPY PROCEDURE NOTE  Procedure Details: After appropriate verbal informed consent was obtained, a timeout was performed. Acetic acid was applied to the vulva and the vulva was inspected with the colposcope with the findings as noted below. The vulva was then cleansed of the acetic acid with water. The patient tolerated the procedure well.   Adequate Exam: yes  Biopsy Specimen: none  Condition: Stable. Patient tolerated procedure well.  Complications: None  Findings: no acetowhite changes of the vulva. No lesions identified   Colposcopic Impression: normal, no dysplasia   LABORATORY AND RADIOLOGIC DATA: Outside  medical records were reviewed to  synthesize the above history, along with the history and physical obtained during the visit.  Outside laboratory, pathology, and imaging reports were reviewed, with pertinent results below.  I personally reviewed the outside images.  WBC  Date Value Ref Range Status  10/23/2022 5.1 4.0 - 10.5 K/uL Final   Hemoglobin  Date Value Ref Range Status  10/23/2022 12.1 12.0 - 15.0 g/dL Final   HCT  Date Value Ref Range Status  10/23/2022 36.9 36.0 - 46.0 % Final   Platelets  Date Value Ref Range Status  10/23/2022 255 150 - 400 K/uL Final   Creat  Date Value Ref Range Status  12/14/2016 0.83 0.50 - 0.99 mg/dL Final    Comment:      For patients > or = 70 years of age: The upper reference limit for Creatinine is approximately 13% higher for people identified as African-American.      Creatinine, Ser  Date Value Ref Range Status  10/23/2022 0.84 0.44 - 1.00 mg/dL Final   AST  Date Value Ref Range Status  12/14/2016 18 10 - 35 U/L Final   ALT  Date Value Ref Range Status  12/14/2016 11 6 - 29 U/L Final   Diagnosis  Date Value Ref Range Status  10/23/2022   Final   - Negative for intraepithelial lesion or malignancy (NILM)    CT ABDOMEN PELVIS W CONTRAST 10/25/2022  Narrative CLINICAL DATA:  Lymphadenopathy in edema of the right thigh.  EXAM: CT ABDOMEN AND PELVIS WITH CONTRAST  TECHNIQUE: Multidetector CT imaging of the abdomen and pelvis was performed using the standard protocol following bolus administration of intravenous contrast.  RADIATION DOSE REDUCTION: This exam was performed according to the departmental dose-optimization program which includes automated exposure control, adjustment of the mA and/or kV according to patient size and/or use of iterative reconstruction technique.  CONTRAST:  80mL OMNIPAQUE IOHEXOL 300 MG/ML  SOLN  COMPARISON:  Extremity ultrasound October 23, 2022.  FINDINGS: Lower chest: No acute abnormality.  Hepatobiliary: No focal  liver abnormality is seen. No gallstones, gallbladder wall thickening, or biliary dilatation.  Pancreas: Unremarkable. No pancreatic ductal dilatation or surrounding inflammatory changes.  Spleen: Normal in size without focal abnormality.  Adrenals/Urinary Tract: Adrenal glands are unremarkable. Kidneys are normal, without renal calculi, focal lesion, or hydronephrosis. Bladder is unremarkable.  Stomach/Bowel: Stomach is within normal limits. The appendix is not identified with certainty but there are no secondary signs of appendicitis. No evidence of bowel wall thickening, distention, or inflammatory changes.  Vascular/Lymphatic: Mild calcific atherosclerotic disease of the aorta. Pelvic lymphadenopathy with the most prominent lymph node along the right iliac chain measuring 5.6 x 2.8 cm.  Reproductive: No evidence of adnexal masses.  Other: Fat stranding along the right common femoral artery and vein with diminutive appearance of the femoral vein at the pelvic outlet. Prominent right inguinal lymph nodes.  Musculoskeletal: Post left hip arthroplasty with intact hardware. Spondylosis of the lower lumbosacral spine. 8 mm sclerotic bone lesion within the right ischium  IMPRESSION: 1. Pelvic lymphadenopathy with the most prominent lymph node along the right iliac chain measuring 5.6 x 2.8 cm. Metastatic lymphadenopathy or lymphoproliferative disease cannot be excluded. 2. 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 3. Prominent right inguinal lymph nodes. 4. 8 mm sclerotic bone lesion within the right ischium, indeterminate. 5.  Aortic atherosclerosis.  Aortic Atherosclerosis (ICD10-I70.0).   Electronically Signed By: Ted Mcalpine M.D. On: 10/25/2022 16:48   US Venous Img Lower Unilateral Right (DVT)  10/23/2022  Narrative CLINICAL DATA:  Lymphadenopathy and edema of right thigh for 3.5 weeks  EXAM: RIGHT LOWER EXTREMITY VENOUS DOPPLER ULTRASOUND  TECHNIQUE: Gray-scale sonography with compression, as well as color and duplex ultrasound, were performed to evaluate the deep venous system(s) from the level of the common femoral vein through the popliteal and proximal calf veins.  COMPARISON:  None Available.  FINDINGS: VENOUS  Nonocclusive thrombus of the right common femoral vein and the saphenofemoral junction. No other filling defects to suggest DVT on grayscale or color Doppler imaging. Doppler waveforms show normal direction of venous flow, normal respiratory plasticity and response to augmentation.  Limited views of the contralateral common femoral vein are unremarkable.  OTHER  Multiple prominent palpable lymph nodes in the right groin measuring up to 2.1 x 1.4 x 0.9 cm. Small fluid collection in the right popliteal fossa characteristic in appearance and location for a Baker's cyst measuring 2.4 x 1.5 x 0.6 cm.  Limitations: none  IMPRESSION: 1. 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 2. Multiple prominent palpable lymph nodes in the right groin measuring up to 2.1 cm, nonspecific. 3. Small right popliteal fossa Baker's cyst.  These results will be called to the ordering clinician or representative by the Radiologist Assistant, and communication documented in the PACS or [employer].   Electronically Signed By: Jearld Lesch M.D. On: 10/23/2022 20:19

## 2022-11-03 ENCOUNTER — Telehealth: Payer: Self-pay | Admitting: Psychiatry

## 2022-11-03 NOTE — Progress Notes (Signed)
Lisa Big, MD  Dorise Hiss; P Ir Procedure Requests Approved for CT guided core biopsy of right pelvic sidewall nodal mass.  The smaller right superficial inguinal LNs are less clearly abnormal.  HKM

## 2022-11-03 NOTE — Telephone Encounter (Signed)
Called the office of [name] PCP to get recommendations on when the patient would need to stop her Eliquis.  We are trying to schedule her for an ultrasound biopsy ASAP and need these.  Callie at the office stated she would have someone call back and give [place] this over the phone.

## 2022-11-04 ENCOUNTER — Telehealth: Payer: Self-pay | Admitting: *Deleted

## 2022-11-04 NOTE — Telephone Encounter (Signed)
Doppler was performed on 10/23/22. CT performed and authorized per EMR on 4/28. Pt seen by [facility] on 5/6. Will route to provider for final review and close encounter.

## 2022-11-04 NOTE — Telephone Encounter (Addendum)
[name], CMA called from Dr. Hinda Glatter office (Pt's PCP) and relayed message:  Patient is to stop eliquis 48 hr. prior to surgery/procedure.   And start lovenox injections (herself) and take last dose the night before her surgery. Once she goes home, take another dose of lovenox the night of her surgery. Then 24 hrs. later ( the night after surgery) -stop the lovenox and go back on the eliquis.   See progress note in Patients chart from Dr. Roney Mans, dated 5/7-5/8: Patient will need to do Lovenox bridge.

## 2022-11-04 NOTE — Telephone Encounter (Signed)
Called back to [name] MD office and spoke with Victorino Dike (CMA).  She stated that patient hadn't been seen in a while (last date of visit was 4/5 with NP and before that was 11/04/2021).  Patient was supposed to be bridging over from Eliquis to Lovenox shots and hasn't made a decision about that yet.  Victorino Dike will call back after speaking with Dr Roney Mans today.

## 2022-11-06 ENCOUNTER — Inpatient Hospital Stay: Payer: Medicare PPO | Admitting: Gynecologic Oncology

## 2022-11-10 ENCOUNTER — Other Ambulatory Visit: Payer: Self-pay | Admitting: Student

## 2022-11-10 ENCOUNTER — Telehealth: Payer: Self-pay | Admitting: *Deleted

## 2022-11-10 DIAGNOSIS — R590 Localized enlarged lymph nodes: Secondary | ICD-10-CM

## 2022-11-10 NOTE — Progress Notes (Signed)
Patient for CT Abd Mass Biopsy on Wed 11/11/2022, I called and spoke with the patient on the phone and gave pre-procedure instructions. Pt was made aware to be here at 7:30a, NPO after MN prior to procedure as well as driver post procedure/recovery/discharge. Pt stated understanding.  Last dose of Eliquis was Sunday 11/08/2022. Pt is on a Lovenox Bridge since then, last dose prior to biopsy is Tues night 11/09/2022.  Called 11/10/2022

## 2022-11-10 NOTE — Telephone Encounter (Signed)
Patient was called to see how she was doing with her lovenox bridge. Pt states she is giving herself the injections and she is following Dr. Hinda Glatter orders. She  states she has her biopsy scheduled tomorrow, Wednesday early morning and that they called her today to go over the procedure and her medications.   Pt states she will stop the lovenox injections the night after her procedure (biopsy) Thursday, and start back up on the eliquis pills.   Pt has no further concerns or questions at this time.

## 2022-11-11 ENCOUNTER — Ambulatory Visit
Admission: RE | Admit: 2022-11-11 | Discharge: 2022-11-11 | Disposition: A | Discharge status: Home / Self Care | Payer: Medicare PPO | Source: Ambulatory Visit | Attending: Psychiatry | Admitting: Psychiatry

## 2022-11-11 DIAGNOSIS — E039 Hypothyroidism, unspecified: Secondary | ICD-10-CM | POA: Diagnosis not present

## 2022-11-11 DIAGNOSIS — R609 Edema, unspecified: Secondary | ICD-10-CM | POA: Diagnosis not present

## 2022-11-11 DIAGNOSIS — I82411 Acute embolism and thrombosis of right femoral vein: Secondary | ICD-10-CM | POA: Diagnosis not present

## 2022-11-11 DIAGNOSIS — R590 Localized enlarged lymph nodes: Secondary | ICD-10-CM | POA: Diagnosis present

## 2022-11-11 LAB — PROTIME-INR
INR: 1 (ref 0.8–1.2)
Prothrombin Time: 13 seconds (ref 11.4–15.2)

## 2022-11-11 LAB — CBC
HCT: 40.3 % (ref 36.0–46.0)
Hemoglobin: 12.9 g/dL (ref 12.0–15.0)
MCH: 29.5 pg (ref 26.0–34.0)
MCHC: 32 g/dL (ref 30.0–36.0)
MCV: 92 fL (ref 80.0–100.0)
Platelets: 295 10*3/uL (ref 150–400)
RBC: 4.38 MIL/uL (ref 3.87–5.11)
RDW: 12.3 % (ref 11.5–15.5)
WBC: 4.5 10*3/uL (ref 4.0–10.5)
nRBC: 0 % (ref 0.0–0.2)

## 2022-11-11 MED ORDER — MIDAZOLAM HCL 2 MG/2ML IJ SOLN
INTRAMUSCULAR | Status: AC
  Filled 2022-11-11: qty 2

## 2022-11-11 MED ORDER — ONDANSETRON HCL 4 MG/2ML IJ SOLN
INTRAMUSCULAR | Status: AC
  Filled 2022-11-11: qty 2

## 2022-11-11 MED ORDER — LIDOCAINE HCL (PF) 1 % IJ SOLN
10.0000 mL | Freq: Once | INTRAMUSCULAR | Status: AC
  Administered 2022-11-11: 10 mL via INTRADERMAL
  Filled 2022-11-11: qty 10

## 2022-11-11 MED ORDER — FENTANYL CITRATE (PF) 100 MCG/2ML IJ SOLN
INTRAMUSCULAR | Status: AC | PRN
  Administered 2022-11-11: 25 ug via INTRAVENOUS

## 2022-11-11 MED ORDER — FENTANYL CITRATE (PF) 100 MCG/2ML IJ SOLN
INTRAMUSCULAR | Status: AC
  Filled 2022-11-11: qty 2

## 2022-11-11 MED ORDER — SODIUM CHLORIDE 0.9 % IV SOLN: INTRAVENOUS | Status: DC

## 2022-11-11 MED ORDER — MIDAZOLAM HCL 5 MG/5ML IJ SOLN
INTRAMUSCULAR | Status: AC | PRN
  Administered 2022-11-11: .5 mg via INTRAVENOUS
  Administered 2022-11-11: 1 mg via INTRAVENOUS

## 2022-11-11 MED ORDER — ONDANSETRON HCL 4 MG/2ML IJ SOLN
INTRAMUSCULAR | Status: AC | PRN
  Administered 2022-11-11: 4 mg via INTRAVENOUS

## 2022-11-11 MED ORDER — ONDANSETRON HCL 4 MG/2ML IJ SOLN: 4.0000 mg | Freq: Four times a day (QID) | INTRAMUSCULAR | Status: DC | PRN

## 2022-11-11 MED ORDER — FENTANYL CITRATE (PF) 100 MCG/2ML IJ SOLN
INTRAMUSCULAR | Status: AC | PRN
  Administered 2022-11-11: 25 ug via INTRAVENOUS
  Administered 2022-11-11: 50 ug via INTRAVENOUS

## 2022-11-11 NOTE — Progress Notes (Signed)
Patient clinically stable post CT LN biopsy per Dr Juliette Alcide, tolerated well. Vitals stable pre and post procedure. Received Versed 1.5 mg along with Fentanyl 100 mcg IV for procedure. Awake/alert and oriented post procedure. Report given to [facility] post procedure/17

## 2022-11-11 NOTE — Progress Notes (Signed)
This patient was on our schedule for 07:30 arrival for abd mass bx.  Upon arrival registration was unable to register patient due to her case cancellation and no longer on schedule board.  I reached out to IR and angie sterling, rt added patient back onto schedule and I notified IR lab and radiologist of this delay. Patient now in unit for prep for bx

## 2022-11-11 NOTE — H&P (Signed)
Chief Complaint: Patient was seen in consultation today for inguinal and pelvic lymphadenopathy  Referring Physician(s): Newton,Meredith  Supervising Physician: Pernell Dupre  Patient Status: [facility] - Out-pt  History of Present Illness: [name] is a 72 y.o. female with PMH significant for hypothyroidism, complications with anesthesia, and PONV being seen today in relation to right inguinal and pelvic lymphadenopathy first noticed by the patient in early April of 2024. Patient has been followed by Dr Alvester Morin from Gyn-Onc service. Patient was referred to IR for image-guided lymph node biopsy by Dr Alvester Morin, and is scheduled for a PET scan on 11/16/22.  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   Top of Left Hand(Tx p Bx)    Past Surgical History:  Procedure Laterality Date   CARPAL TUNNEL RELEASE     BILAT     CATARACT EXTRACTION     BILAT    ECTOPIC PREGNANCY SURGERY     in the setting of prior BTL, lapartomy   EYE SURGERY     KNEE CARTILAGE SURGERY     RIGHT    (MENISCUS)   MOHS SURGERY     left nose/inner eye for basal cell   TONSILLECTOMY     TOTAL HIP ARTHROPLASTY Left 12/19/2015   TOTAL HIP ARTHROPLASTY Left 12/19/2015   Procedure: LEFT TOTAL HIP ARTHROPLASTY;  Surgeon: Valeria Batman, MD;  Location: [facility] OR;  Service: Orthopedics;  Laterality: Left;   TRIGGER FINGER RELEASE     TUBAL LIGATION      Allergies: Sulfa antibiotics  Medications: Prior to Admission medications   Medication Sig Start Date End Date Taking? Authorizing Provider  APIXABAN Everlene Balls) VTE STARTER PACK (10MG  AND 5MG ) Take as directed on package: start  with two-5mg  tablets twice daily for 7 days. On day 8, switch to one-5mg  tablet twice daily. 10/23/22   Redwine, Madison A, PA-C  Calcium Carb-Cholecalciferol (CALCIUM 1000 + D PO) Take by mouth.    [provider]  diclofenac sodium (VOLTAREN) 1 % GEL Apply 2-4 g topically 4 (four) times daily. 04/19/18   Jetty Peeks, PA-C  ferrous sulfate 325 (65 FE) MG EC tablet Take 325 mg by mouth at bedtime.    [provider]  Glucosamine 500 MG CAPS 1 capsule Orally twice a day    [provider]  Glucosamine-Chondroit-Vit C-Mn (GLUCOSAMINE CHONDR 1500 COMPLX PO) Take by mouth.    [provider]  levothyroxine (SYNTHROID) 75 MCG tablet TAKE ONE (1) TABLET BY MOUTH EVERY DAY 10/12/22   Carlus Pavlov, MD  Multiple Vitamin (MULTIVITAMIN) tablet Take 1 tablet by mouth daily.    [provider]  Turmeric 400 MG CAPS Take by mouth. 09/11/21   [provider]     Family History  Problem Relation Age of Onset   Uterine cancer Mother    Heart attack Mother    Hypertension Mother    Kidney cancer Father    Lung cancer Father    Diabetes Sister    Thyroid disease Neg Hx    Colon cancer Neg Hx    Breast cancer Neg Hx    Ovarian  cancer Neg Hx    Endometrial cancer Neg Hx    Pancreatic cancer Neg Hx    Prostate cancer Neg Hx     Social History   Socioeconomic History   Marital status: Married    Spouse name: Not on file   Number of children: Not on file   Years of education: Not on file   Highest education level: Not on file  Occupational History   Not on file  Tobacco Use   Smoking status: Never   Smokeless tobacco: Never  Vaping Use   Vaping Use: Never used  Substance and Sexual Activity   Alcohol use: Yes    Alcohol/week: 0.0 standard drinks of alcohol    Comment: Rare   Drug use: No   Sexual activity: Yes    Birth control/protection: Surgical, Post-menopausal    Comment: Tubal lig-1st intercourse 72 yo-Fewer than 5 partners   Other Topics Concern   Not on file  Social History Narrative   Not on file   Social Determinants of Health   Financial Resource Strain: Not on file  Food Insecurity: No Food Insecurity (10/30/2022)   Hunger Vital Sign    Worried About Running Out of Food in the Last Year: Never true    Ran Out of Food in the Last Year: Never true  Transportation Needs: Not on file  Physical Activity: Not on file  Stress: Not on file  Social Connections: Not on file    Code Status: Full code  Review of Systems: A 12 point ROS discussed and pertinent positives are indicated in the HPI above.  All other systems are negative.  Review of Systems  Constitutional:  Negative for chills and fever.  Respiratory:  Negative for chest tightness and shortness of breath.   Cardiovascular:  Positive for leg swelling. Negative for chest pain.       Patient reports right thigh swelling associated with known non-occlusive right common femoral vein thrombus  Gastrointestinal:  Negative for abdominal pain, diarrhea, nausea and vomiting.  Neurological:  Negative for dizziness and headaches.  Psychiatric/Behavioral:  Negative for confusion.     Vital Signs: BP (!) 140/71   Pulse 67   Temp 97.7 F (36.5 C) (Oral)   Resp 12   Ht 5\' 2"  (1.575 m)   Wt 150 lb (68 kg)   SpO2 99%   BMI 27.44 kg/m    Physical Exam Vitals reviewed.  Constitutional:      General: She is not in acute distress.    Appearance: She is not ill-appearing.  HENT:     Mouth/Throat:     Mouth: Mucous membranes are moist.  Eyes:     Pupils: Pupils are equal, round, and reactive to light.  Cardiovascular:     Rate and Rhythm: Normal rate and regular rhythm.     Pulses: Normal pulses.     Heart sounds: Normal heart sounds.  Pulmonary:     Effort: Pulmonary effort is normal.     Breath sounds: Normal breath sounds.  Abdominal:     Palpations: Abdomen is soft.     Tenderness: There is no abdominal tenderness.  Musculoskeletal:      Right lower leg: Edema present.     Left lower leg: No edema.     Comments: Proximal portion of right lower extremity with non-pitting edema  Skin:    General: Skin is warm and dry.  Neurological:     Mental Status: She is alert and oriented to person,  place, and time.  Psychiatric:        Mood and Affect: Mood normal.        Behavior: Behavior normal.        Thought Content: Thought content normal.        Judgment: Judgment normal.     Imaging: CT ABDOMEN PELVIS W CONTRAST  Result Date: 10/25/2022 CLINICAL DATA:  Lymphadenopathy in edema of the right thigh. EXAM: CT ABDOMEN AND PELVIS WITH CONTRAST TECHNIQUE: Multidetector CT imaging of the abdomen and pelvis was performed using the standard protocol following bolus administration of intravenous contrast. RADIATION DOSE REDUCTION: This exam was performed according to the departmental dose-optimization program which includes automated exposure control, adjustment of the mA and/or kV according to patient size and/or use of iterative reconstruction technique. CONTRAST:  80mL OMNIPAQUE IOHEXOL 300 MG/ML  SOLN COMPARISON:  Extremity ultrasound October 23, 2022. FINDINGS: Lower chest: No acute abnormality. Hepatobiliary: No focal liver abnormality is seen. No gallstones, gallbladder wall thickening, or biliary dilatation. Pancreas: Unremarkable. No pancreatic ductal dilatation or surrounding inflammatory changes. Spleen: Normal in size without focal abnormality. Adrenals/Urinary Tract: Adrenal glands are unremarkable. Kidneys are normal, without renal calculi, focal lesion, or hydronephrosis. Bladder is unremarkable. Stomach/Bowel: Stomach is within normal limits. The appendix is not identified with certainty but there are no secondary signs of appendicitis. No evidence of bowel wall thickening, distention, or inflammatory changes. Vascular/Lymphatic: Mild calcific atherosclerotic disease of the aorta. Pelvic lymphadenopathy with the most prominent lymph  node along the right iliac chain measuring 5.6 x 2.8 cm. Reproductive: No evidence of adnexal masses. Other: Fat stranding along the right common femoral artery and vein with diminutive appearance of the femoral vein at the pelvic outlet. Prominent right inguinal lymph nodes. Musculoskeletal: Post left hip arthroplasty with intact hardware. Spondylosis of the lower lumbosacral spine. 8 mm sclerotic bone lesion within the right ischium IMPRESSION: 1. Pelvic lymphadenopathy with the most prominent lymph node along the right iliac chain measuring 5.6 x 2.8 cm. Metastatic lymphadenopathy or lymphoproliferative disease cannot be excluded. 2. 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 3. Prominent right inguinal lymph nodes. 4. 8 mm sclerotic bone lesion within the right ischium, indeterminate. 5. Aortic atherosclerosis. Aortic Atherosclerosis (ICD10-I70.0). Electronically Signed   By: Ted Mcalpine M.D.   On: 10/25/2022 16:48   US Venous Img Lower Unilateral Right (DVT)  Result Date: 10/23/2022 CLINICAL DATA:  Lymphadenopathy and edema of right thigh for 3.5 weeks EXAM: RIGHT LOWER EXTREMITY VENOUS DOPPLER ULTRASOUND TECHNIQUE: Gray-scale sonography with compression, as well as color and duplex ultrasound, were performed to evaluate the deep venous system(s) from the level of the common femoral vein through the popliteal and proximal calf veins. COMPARISON:  None Available. FINDINGS: VENOUS Nonocclusive thrombus of the right common femoral vein and the saphenofemoral junction. No other filling defects to suggest DVT on grayscale or color Doppler imaging. Doppler waveforms show normal direction of venous flow, normal respiratory plasticity and response to augmentation. Limited views of the contralateral common femoral vein are unremarkable. OTHER  Multiple prominent palpable lymph nodes in the right groin measuring up to 2.1 x 1.4 x 0.9 cm. Small fluid collection in the right popliteal fossa characteristic in appearance and location for a Baker's cyst measuring 2.4 x 1.5 x 0.6 cm. Limitations: none IMPRESSION: 1. Nonocclusive thrombus of the right common femoral vein and saphenofemoral  junction. 2. Multiple prominent palpable lymph nodes in the right groin measuring up to 2.1 cm, nonspecific. 3. Small right popliteal fossa Baker's cyst. These results will be called to the ordering clinician or representative by the Radiologist Assistant, and communication documented in the PACS or [employer]. Electronically Signed   By: Jearld Lesch M.D.   On: 10/23/2022 20:19    Labs:  CBC: Recent Labs    10/23/22 2108 11/11/22 0806  WBC 5.1 4.5  HGB 12.1 12.9  HCT 36.9 40.3  PLT 255 295    COAGS: Recent Labs    11/11/22 0806  INR 1.0    BMP: Recent Labs    10/23/22 2108  NA 138  K 3.9  CL 102  CO2 28  GLUCOSE 97  BUN 15  CALCIUM 9.7  CREATININE 0.84  GFRNONAA >60    LIVER FUNCTION TESTS: No results for input(s): "BILITOT", "AST", "ALT", "ALKPHOS", "PROT", "ALBUMIN" in the last 8760 hours.  TUMOR MARKERS: No results for input(s): "AFPTM", "CEA", "CA199", "CHROMGRNA" in the last 8760 hours.  Assessment and Plan:  [name] is a 72 yo female being seen today in relation to right inguinal and pelvic lymphadenopathy. Patient is followed by Dr Alvester Morin from Gyn-Onc service. The patient presents today in her usual state of health and is NPO. 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 Case has been reviewed with Dr Juliette Alcide and is scheduled to proceed on 11/11/22.   Risks and benefits of image-guided inguinal and/or pelvic lymph node biopsy was discussed with the patient and/or patient's family including, but not limited to bleeding, infection, damage to adjacent  structures or low yield requiring additional tests.  All of the questions were answered and there is agreement to proceed.  Consent signed and in chart.   Thank you for this interesting consult.  I greatly enjoyed meeting [place] and look forward to participating in their care.  A copy of this report was sent to the requesting provider on this date.  Electronically Signed: Kennieth Francois, PA-C 11/11/2022, 8:32 AM   I spent a total of  30 Minutes   in face to face in clinical consultation, greater than 50% of which was counseling/coordinating care for inguinal and pelvic lymphadenopathy.

## 2022-11-11 NOTE — Procedures (Signed)
Interventional Radiology Procedure Note  Date of Procedure: 11/11/2022  Procedure: CT pelvic LN biposy   Findings:  1. CT right pelvic LN biopsy  18ga x7 cores   Complications: No immediate complications noted.   Estimated Blood Loss: minimal  Follow-up and Recommendations: 1. Bedrest 1 hour    Olive Bass, MD  Vascular & Interventional Radiology  11/11/2022 9:23 AM

## 2022-11-16 ENCOUNTER — Encounter: Payer: Self-pay | Admitting: Physician Assistant

## 2022-11-16 ENCOUNTER — Ambulatory Visit: Payer: Medicare PPO

## 2022-11-16 ENCOUNTER — Other Ambulatory Visit (INDEPENDENT_AMBULATORY_CARE_PROVIDER_SITE_OTHER): Payer: Medicare PPO

## 2022-11-16 ENCOUNTER — Ambulatory Visit: Payer: Medicare PPO | Admitting: Physician Assistant

## 2022-11-16 ENCOUNTER — Ambulatory Visit
Admission: RE | Admit: 2022-11-16 | Discharge: 2022-11-16 | Disposition: A | Discharge status: Home / Self Care | Payer: Medicare PPO | Source: Ambulatory Visit | Attending: Psychiatry | Admitting: Psychiatry

## 2022-11-16 DIAGNOSIS — I700 Atherosclerosis of aorta: Secondary | ICD-10-CM | POA: Insufficient documentation

## 2022-11-16 DIAGNOSIS — S62101D Fracture of unspecified carpal bone, right wrist, subsequent encounter for fracture with routine healing: Secondary | ICD-10-CM

## 2022-11-16 DIAGNOSIS — R911 Solitary pulmonary nodule: Secondary | ICD-10-CM | POA: Diagnosis not present

## 2022-11-16 DIAGNOSIS — M25531 Pain in right wrist: Secondary | ICD-10-CM

## 2022-11-16 DIAGNOSIS — R590 Localized enlarged lymph nodes: Secondary | ICD-10-CM

## 2022-11-16 DIAGNOSIS — R935 Abnormal findings on diagnostic imaging of other abdominal regions, including retroperitoneum: Secondary | ICD-10-CM | POA: Diagnosis not present

## 2022-11-16 DIAGNOSIS — R600 Localized edema: Secondary | ICD-10-CM | POA: Diagnosis not present

## 2022-11-16 LAB — GLUCOSE, CAPILLARY: Glucose-Capillary: 90 mg/dL (ref 70–99)

## 2022-11-16 MED ORDER — FLUDEOXYGLUCOSE F - 18 (FDG) INJECTION
8.3600 | Freq: Once | INTRAVENOUS | Status: AC | PRN
  Administered 2022-11-16: 8.36 via INTRAVENOUS

## 2022-11-16 NOTE — Progress Notes (Signed)
Office Visit Note   Patient: [name]           Date of Birth: 1951/03/16           MRN: [id number] Visit Date: 11/16/2022              Requested by: [name], MD [id number] [place],  [place] [address] PCP: [name], MD   Assessment & Plan: Visit Diagnoses:  1. Pain in right wrist     Plan: Encouraged elevation wiggling the fingers.  She will keep the splint clean dry and intact.  Will see her back in 1 week we will remove the splint and obtain 3 views of her right wrist.  At that point time we will place her in a long-arm cast.  Follow-Up Instructions: Return in about 1 week (around 11/23/2022).   Orders:  Orders Placed This Encounter  Procedures   XR Wrist Complete Right   No orders of the defined types were placed in this encounter.     Procedures: No procedures performed   Clinical Data: No additional findings.   Subjective: Chief Complaint  Patient presents with   Right Wrist - Pain    HPI [name] comes in today for new complaint of right wrist injury which occurred on 11/14/2022.  She was in [place] playing in the tenderness current whenever she tripped over a raised line.  She was seen there and was diagnosed with a distal radius fracture.  She was placed appropriately in a long-arm splint.  She states she is not having much pain and is taking Tylenol as needed. Review of Systems See HPI.  Objective: Vital Signs: There were no vitals taken for this visit.  Physical Exam Constitutional:      Appearance: She is not ill-appearing or diaphoretic.  Pulmonary:     Effort: Pulmonary effort is normal.  Neurological:     Mental Status: She is alert.     Ortho Exam Right arm nontender over the elbow.  Long-arm sugar-tong splint clean dry intact.  Slight edema of the fingers.  Sensation grossly intact light touch.  Specialty Comments:  No specialty comments available.  Imaging: XR Wrist Complete  Right  Result Date: 11/16/2022 Right wrist 3 views: Minimally displaced comminuted distal radius fracture intra-articular.  No other fractures identified.    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   Hypothyroidism due to Hashimoto's thyroiditis 10/15/2015   Degenerative disc disease    Arthritis    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   Top of Left Hand(Tx p Bx)    Family History  Problem Relation Age of Onset   Uterine cancer Mother    Heart attack Mother    Hypertension Mother    Kidney cancer Father    Lung cancer Father    Diabetes Sister  Thyroid disease Neg Hx    Colon cancer Neg Hx    Breast cancer Neg Hx    Ovarian cancer Neg Hx    Endometrial cancer Neg Hx    Pancreatic cancer Neg Hx    Prostate cancer Neg Hx     Past Surgical History:  Procedure Laterality Date   CARPAL TUNNEL RELEASE     BILAT     CATARACT EXTRACTION     BILAT    ECTOPIC PREGNANCY SURGERY     in the setting of prior BTL, lapartomy   EYE SURGERY     KNEE CARTILAGE SURGERY     RIGHT    (MENISCUS)   MOHS SURGERY     left nose/inner eye for basal cell   TONSILLECTOMY     TOTAL HIP ARTHROPLASTY Left 12/19/2015   TOTAL HIP ARTHROPLASTY Left 12/19/2015   Procedure: LEFT TOTAL HIP ARTHROPLASTY;  Surgeon: Valeria Batman, MD;  Location: [facility] OR;  Service: Orthopedics;   Laterality: Left;   TRIGGER FINGER RELEASE     TUBAL LIGATION     Social History   Occupational History   Not on file  Tobacco Use   Smoking status: Never   Smokeless tobacco: Never  Vaping Use   Vaping Use: Never used  Substance and Sexual Activity   Alcohol use: Yes    Alcohol/week: 0.0 standard drinks of alcohol    Comment: Rare   Drug use: No   Sexual activity: Yes    Birth control/protection: Surgical, Post-menopausal    Comment: Tubal lig-1st intercourse 72 yo-Fewer than 5 partners

## 2022-11-17 LAB — SURGICAL PATHOLOGY

## 2022-11-18 ENCOUNTER — Encounter: Payer: Self-pay | Admitting: Psychiatry

## 2022-11-18 ENCOUNTER — Telehealth: Payer: Self-pay | Admitting: Psychiatry

## 2022-11-18 ENCOUNTER — Other Ambulatory Visit: Payer: Medicare PPO

## 2022-11-18 ENCOUNTER — Telehealth: Payer: Self-pay | Admitting: Oncology

## 2022-11-18 NOTE — Telephone Encounter (Signed)
Called patient regarding her biopsy and PET/CT results.  DIAGNOSIS: A. LYMPH NODE, RIGHT PELVIC; CT-GUIDED CORE NEEDLE BIOPSY: - METASTATIC POORLY DIFFERENTIATED CARCINOMA, SEE COMMENT.  Comment: Immunohistochemical studies show tumor cells to be positive for CK7, MOC-31, GATA-3, CK5/6, and calretinin (subset). P16 is strongly and diffusely positive. P53 demonstrates an absence of staining within tumor cells, indicative of a null staining pattern. PAX-8, WT-1, D240, TTF-1, cdx-2, and p40 are negative. The pattern of immunohistochemical staining is non-specific. Based on the morphologic features and pattern of immunohistochemical staining the differential diagnosis includes metastatic urothelial carcinoma, metastatic breast carcinoma, and metastatic serous carcinoma of gyn origin. Correlation with radiographic findings is required.   Reviewed that etiology is not certain at this time. PET does not show origin of disease. She has follow-up with me next week. Recommend embx at that time to potentially rule out endometrial. Suspect that a fallopian or ovary cancer is possible. Otherwise wold start with chemo and follow-up response to treatment. Will refer her to Dr. Bertis Ruddy for neoadjuvant chemo.

## 2022-11-18 NOTE — Telephone Encounter (Signed)
Called [place] and advised her of new patient appointment with Dr. Bertis Ruddy on 11/20/22 at 1:00 with a 12:30 arrival.  Advised her to bring a family member or friend with her and discussed where to check in.  She verbalized understanding and agreement.

## 2022-11-19 ENCOUNTER — Encounter: Payer: Self-pay | Admitting: Hematology and Oncology

## 2022-11-19 DIAGNOSIS — C55 Malignant neoplasm of uterus, part unspecified: Secondary | ICD-10-CM | POA: Insufficient documentation

## 2022-11-19 DIAGNOSIS — C579 Malignant neoplasm of female genital organ, unspecified: Secondary | ICD-10-CM | POA: Insufficient documentation

## 2022-11-20 ENCOUNTER — Inpatient Hospital Stay: Payer: Medicare PPO | Admitting: Hematology and Oncology

## 2022-11-20 ENCOUNTER — Other Ambulatory Visit: Payer: Self-pay

## 2022-11-20 ENCOUNTER — Encounter: Payer: Self-pay | Admitting: Hematology and Oncology

## 2022-11-20 ENCOUNTER — Encounter: Payer: Self-pay | Admitting: Oncology

## 2022-11-20 VITALS — BP 94/58 | HR 71 | Temp 99.3°F | Resp 18 | Ht 62.0 in | Wt 155.2 lb

## 2022-11-20 DIAGNOSIS — I82411 Acute embolism and thrombosis of right femoral vein: Secondary | ICD-10-CM

## 2022-11-20 DIAGNOSIS — I82401 Acute embolism and thrombosis of unspecified deep veins of right lower extremity: Secondary | ICD-10-CM | POA: Insufficient documentation

## 2022-11-20 DIAGNOSIS — R591 Generalized enlarged lymph nodes: Secondary | ICD-10-CM | POA: Diagnosis not present

## 2022-11-20 DIAGNOSIS — D649 Anemia, unspecified: Secondary | ICD-10-CM

## 2022-11-20 DIAGNOSIS — C579 Malignant neoplasm of female genital organ, unspecified: Secondary | ICD-10-CM

## 2022-11-20 DIAGNOSIS — C55 Malignant neoplasm of uterus, part unspecified: Secondary | ICD-10-CM

## 2022-11-20 MED ORDER — DEXAMETHASONE 4 MG PO TABS: ORAL_TABLET | ORAL | 6 refills | Status: DC

## 2022-11-20 MED ORDER — PROCHLORPERAZINE MALEATE 10 MG PO TABS: 10.0000 mg | ORAL_TABLET | Freq: Four times a day (QID) | ORAL | 0 refills | Status: DC | PRN

## 2022-11-20 MED ORDER — ONDANSETRON HCL 8 MG PO TABS: 8.0000 mg | ORAL_TABLET | Freq: Three times a day (TID) | ORAL | 3 refills | Status: DC | PRN

## 2022-11-20 MED ORDER — LIDOCAINE-PRILOCAINE 2.5-2.5 % EX CREA: 1.0000 | TOPICAL_CREAM | CUTANEOUS | 3 refills | Status: DC | PRN

## 2022-11-20 NOTE — Assessment & Plan Note (Signed)
Recommend minimum 3 to 6 months of anticoagulation therapy

## 2022-11-20 NOTE — Assessment & Plan Note (Signed)
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 Alvester Morin We discussed the role of chemotherapy If PD-L1 or MMR came back abnormal, she would qualify for addition of immunotherapy Recommend port placement and chemo education class We will call her with test results next week We discussed the risk, benefits, side effects of chemotherapy with carboplatin and paclitaxel with or without addition of immunotherapy I am hopeful we can get her started on chemotherapy within the first week of June

## 2022-11-20 NOTE — Assessment & Plan Note (Signed)
She has chronic history of intermittent anemia I will order additional workup for anemia

## 2022-11-20 NOTE — Progress Notes (Signed)
Also requested ER/PR on accession [id number] with [facility] via fax.

## 2022-11-20 NOTE — Progress Notes (Signed)
Requested PD-L1 and MMR testing on accession [id number] with [facility] by fax request.

## 2022-11-20 NOTE — Progress Notes (Signed)
[facility] CONSULT NOTE  Patient Care Team: [name], MD as PCP - General (Family Medicine)  ASSESSMENT & PLAN:  Uterine cancer [facility]) I reviewed her PET/CT imaging, CT scan as well as gave the patient and her husband a copy of the pathology report Overall, I felt that the immunohistochemical stains are most consistent with primary GYN malignancy, likely uterine cancer I will add ER/PR, the MMR and PD-L1 test to her recent biopsy I recommend pelvic examination as scheduled next week with Dr. Alvester Morin We discussed the role of chemotherapy If PD-L1 or MMR came back abnormal, she would qualify for addition of immunotherapy Recommend port placement and chemo education class We will call her with test results next week We discussed the risk, benefits, side effects of chemotherapy with carboplatin and paclitaxel with or without addition of immunotherapy I am hopeful we can get her started on chemotherapy within the first week of June  Acute deep vein thrombosis (DVT) of right lower extremity (HCC) Recommend minimum 3 to 6 months of anticoagulation therapy  Mild anemia She has chronic history of intermittent anemia I will order additional workup for anemia  Orders Placed This Encounter  Procedures   IR IMAGING GUIDED PORT INSERTION    Standing Status:   Future    Standing Expiration Date:   11/20/2023    Order Specific Question:   Reason for Exam (SYMPTOM  OR DIAGNOSIS REQUIRED)    Answer:   need port for chemo in 10 days    Order Specific Question:   Preferred Imaging Location?    Answer:   [facility]   CBC with Differential (Cancer Center Only)    Standing Status:   Future    Standing Expiration Date:   11/20/2023   CMP (Cancer Center only)    Standing Status:   Future    Standing Expiration Date:   11/20/2023   Iron and Iron Binding Capacity ([facility] only)    Standing Status:   Future    Standing Expiration Date:   11/20/2023   Ferritin    Standing  Status:   Future    Standing Expiration Date:   11/20/2023   Vitamin B12    Standing Status:   Future    Standing Expiration Date:   11/20/2023   TSH    Standing Status:   Future    Standing Expiration Date:   11/20/2023    The total time spent in the appointment was 60 minutes encounter with patients including review of chart and various tests results, discussions about plan of care and coordination of care plan   All questions were answered. The patient knows to call the clinic with any problems, questions or concerns. No barriers to learning was detected.  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 female is here because of recent diagnosis of cancer The patient is an excellent historian She is here accompanied by her husband, Susy Frizzle She is Agricultural engineer at [employer].  She has 2 daughters and multiple grandchildren The patient has been very active and plays tennis on a regular basis Her symptoms began with discoloration of her right lower extremity and swelling in the right groin She had recent menopausal symptoms and went to see her gynecologist who ordered additional testing leading to the diagnosis of cancer She was  placed on anticoagulation therapy for presence of blood clot.  She has some mild brief hematuria but that has since resolved She has strong family history of uterine cancer  She had multiple imaging studies and biopsy which show evidence of poorly differentiated carcinoma  I have reviewed her chart and materials related to her cancer extensively and collaborated history with the patient. Summary of oncologic history is as follows: Oncology History  Uterine cancer (HCC)  10/23/2022 Imaging   1. Nonocclusive thrombus of the right common femoral  vein and saphenofemoral junction. 2. Multiple prominent palpable lymph nodes in the right groin measuring up to 2.1 cm, nonspecific. 3. Small right popliteal fossa Baker's cyst.   10/25/2022 Imaging   Ct imaging 1. Pelvic lymphadenopathy with the most prominent lymph node along the right iliac chain measuring 5.6 x 2.8 cm. Metastatic lymphadenopathy or lymphoproliferative disease cannot be excluded.  2. 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 3. Prominent right inguinal lymph nodes. 4. 8 mm sclerotic bone lesion within the right ischium, indeterminate. 5. Aortic atherosclerosis.   Aortic Atherosclerosis (ICD10-I70.0).       11/11/2022 Pathology Results   SURGICAL PATHOLOGY CASE: [id number] PATIENT: [name] Surgical Pathology Report  Specimen Submitted: A. Lymph node, right pelvic  Clinical History: Pelvic lymphadenopathy  DIAGNOSIS: A. LYMPH NODE, RIGHT PELVIC; CT-GUIDED CORE NEEDLE BIOPSY: - METASTATIC POORLY DIFFERENTIATED CARCINOMA, SEE COMMENT.  Comment: Immunohistochemical studies show tumor cells to be positive for CK7, MOC-31, GATA-3, CK5/6, and calretinin (subset). P16 is strongly and diffusely positive. P53 demonstrates an absence of staining within tumor cells, indicative of a null staining pattern. PAX-8, WT-1, D240, TTF-1, cdx-2, and p40 are negative. The pattern of immunohistochemical staining is non-specific. Based on the morphologic features and pattern of immunohistochemical staining the differential diagnosis includes metastatic urothelial carcinoma, metastatic breast carcinoma, and metastatic serous carcinoma of gyn origin. Correlation with radiographic findings is required.  There is sufficient tissue present for ancillary molecular testing.  IHC slides were prepared by [facility] Path[facility], [place], [place]. All controls stained appropriately.   11/16/2022 PET scan   1. Examination is positive for tracer avid adenopathy within the retroperitoneum, bilateral pelvis and right inguinal region. Findings are compatible with nodal metastasis. Primary neoplasm is not apparent on the current exam.  2. No signs of solid organ metastasis within the abdomen or pelvis. 3. No signs of tracer avid disease above the diaphragm. 4. Asymmetric subcutaneous edema is identified involving the visualized portions of the right lower extremity. 5. 5 mm perifissural nodule in the left mid lung is too small to characterize by PET-CT. 6. Increased uptake within both lobes of thyroid gland, right greater than left. Correlation with thyroid function tests advised. 7.  Aortic Atherosclerosis (ICD10-I70.0).   11/19/2022 Initial Diagnosis   Gynecologic malignancy (HCC)   11/20/2022 Cancer Staging   Staging form: Corpus Uteri - Carcinoma and Carcinosarcoma, AJCC 8th Edition - Clinical stage from 11/20/2022: FIGO Stage IVB (cTX, cN2a, pM1) - Signed by Artis Delay, MD on 11/20/2022 Stage prefix: Initial diagnosis     MEDICAL HISTORY:  Past Medical History:  Diagnosis Date   Arthritis    Cataract    Complication of anesthesia    SPINAL  -  CONVULSIONS (PLACED IN WRONG PLACE)   Degenerative disc disease    Hypothyroidism    Nodular  basal cell carcinoma (BCC) 03/19/2016   Left Inner Eye(MOH's)   PONV (postoperative nausea and vomiting)    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   Top of Left Hand(Tx p Bx)    SURGICAL HISTORY: Past Surgical History:  Procedure Laterality Date   CARPAL TUNNEL RELEASE     BILAT     CATARACT EXTRACTION     BILAT    ECTOPIC PREGNANCY SURGERY     in the setting of prior BTL, lapartomy   EYE SURGERY     KNEE CARTILAGE SURGERY     RIGHT    (MENISCUS)   MOHS SURGERY     left nose/inner eye for basal cell   TONSILLECTOMY      TOTAL HIP ARTHROPLASTY Left 12/19/2015   TOTAL HIP ARTHROPLASTY Left 12/19/2015   Procedure: LEFT TOTAL HIP ARTHROPLASTY;  Surgeon: Valeria Batman, MD;  Location: [facility] OR;  Service: Orthopedics;  Laterality: Left;   TRIGGER FINGER RELEASE     TUBAL LIGATION      SOCIAL HISTORY: Social History   Socioeconomic History   Marital status: Married    Spouse name: Not on file   Number of children: Not on file   Years of education: Not on file   Highest education level: Not on file  Occupational History   Not on file  Tobacco Use   Smoking status: Never   Smokeless tobacco: Never  Vaping Use   Vaping Use: Never used  Substance and Sexual Activity   Alcohol use: Yes    Alcohol/week: 0.0 standard drinks of alcohol    Comment: Rare   Drug use: No   Sexual activity: Yes    Birth control/protection: Surgical, Post-menopausal    Comment: Tubal lig-1st intercourse 72 yo-Fewer than 5 partners  Other Topics Concern   Not on file  Social History Narrative   Not on file   Social Determinants of Health   Financial Resource Strain: Not on file  Food Insecurity: No Food Insecurity (10/30/2022)   Hunger Vital Sign    Worried About Running Out of Food in the Last Year: Never true    Ran Out of Food in the Last Year: Never true  Transportation Needs: Not on file  Physical Activity: Not on file  Stress: Not on file  Social Connections: Not on file  Intimate Partner Violence: Not At Risk (10/30/2022)   Humiliation, Afraid, Rape, and Kick questionnaire    Fear of Current or Ex-Partner: No    Emotionally Abused: No    Physically Abused: No    Sexually Abused: No    FAMILY HISTORY: Family History  Problem Relation Age of Onset   Uterine cancer Mother    Heart attack Mother    Hypertension Mother    Kidney cancer Father    Lung cancer Father    Diabetes Sister    Thyroid disease Neg Hx    Colon cancer Neg Hx    Breast cancer Neg Hx    Ovarian cancer Neg Hx    Endometrial cancer  Neg Hx    Pancreatic cancer Neg Hx    Prostate cancer Neg Hx     ALLERGIES:  is allergic to sulfa antibiotics.  MEDICATIONS:  Current Outpatient Medications  Medication Sig Dispense Refill   dexamethasone (DECADRON) 4 MG tablet Take 2 tabs at the night before and 2 tab the morning of chemotherapy, every 3 weeks, by mouth x 6 cycles 24 tablet 6   lidocaine-prilocaine (EMLA) cream Apply 1 Application  topically as needed. 30 g 3   ondansetron (ZOFRAN) 8 MG tablet Take 1 tablet (8 mg total) by mouth every 8 (eight) hours as needed for nausea. 30 tablet 3   prochlorperazine (COMPAZINE) 10 MG tablet Take 1 tablet (10 mg total) by mouth every 6 (six) hours as needed for nausea or vomiting. 30 tablet 0   APIXABAN (ELIQUIS) VTE STARTER PACK (10MG  AND 5MG ) Take as directed on package: start with two-5mg  tablets twice daily for 7 days. On day 8, switch to one-5mg  tablet twice daily. 1 each 0   Calcium Carb-Cholecalciferol (CALCIUM 1000 + D PO) Take by mouth.     diclofenac sodium (VOLTAREN) 1 % GEL Apply 2-4 g topically 4 (four) times daily. 3 Tube 3   Glucosamine 500 MG CAPS 1 capsule Orally twice a day     Glucosamine-Chondroit-Vit C-Mn (GLUCOSAMINE CHONDR 1500 COMPLX PO) Take by mouth.     levothyroxine (SYNTHROID) 75 MCG tablet TAKE ONE (1) TABLET BY MOUTH EVERY DAY 45 tablet 2   Multiple Vitamin (MULTIVITAMIN) tablet Take 1 tablet by mouth daily.     Turmeric 400 MG CAPS Take by mouth.     No current facility-administered medications for this visit.    REVIEW OF SYSTEMS:   Constitutional: Denies fevers, chills or abnormal night sweats Eyes: Denies blurriness of vision, double vision or watery eyes Ears, nose, mouth, throat, and face: Denies mucositis or sore throat Respiratory: Denies cough, dyspnea or wheezes Cardiovascular: Denies palpitation, chest discomfort or lower extremity swelling Gastrointestinal:  Denies nausea, heartburn or change in bowel habits Skin: Denies abnormal skin  rashes Lymphatics: Denies new lymphadenopathy or easy bruising Neurological:Denies numbness, tingling or new weaknesses Behavioral/Psych: Mood is stable, no new changes  All other systems were reviewed with the patient and are negative.  PHYSICAL EXAMINATION: ECOG PERFORMANCE STATUS: 1 - Symptomatic but completely ambulatory  Vitals:   11/20/22 1239  BP: (!) 94/58  Pulse: 71  Resp: 18  Temp: 99.3 F (37.4 C)  SpO2: 100%   Filed Weights   11/20/22 1239  Weight: 155 lb 3.2 oz (70.4 kg)    GENERAL:alert, no distress and comfortable SKIN: skin color, texture, turgor are normal, no rashes or significant lesions EYES: normal, conjunctiva are pink and non-injected, sclera clear OROPHARYNX:no exudate, no erythema and lips, buccal mucosa, and tongue normal  NECK: supple, thyroid normal size, non-tender, without nodularity LYMPH: She has palpable right inguinal lymphadenopathy LUNGS: clear to auscultation and percussion with normal breathing effort HEART: regular rate & rhythm and no murmurs and with mild erythematous changes on the right lower extremity.  Presence of varicose veins ABDOMEN:abdomen soft, non-tender and normal bowel sounds.   Musculoskeletal:no cyanosis of digits and no clubbing  PSYCH: alert & oriented x 3 with fluent speech NEURO: no focal motor/sensory deficits  LABORATORY DATA:  I have reviewed the data as listed Lab Results  Component Value Date   WBC 4.5 11/11/2022   HGB 12.9 11/11/2022   HCT 40.3 11/11/2022   MCV 92.0 11/11/2022   PLT 295 11/11/2022   Recent Labs    10/23/22 2108  NA 138  K 3.9  CL 102  CO2 28  GLUCOSE 97  BUN 15  CREATININE 0.84  CALCIUM 9.7  GFRNONAA >60    RADIOGRAPHIC STUDIES: I have personally reviewed the radiological images as listed and agreed with the findings in the report. NM PET Image Restage (PS) Skull Base to Thigh (F-18 FDG)  Result Date: 11/18/2022 CLINICAL DATA:  Initial treatment strategy for adenopathy.  EXAM: NUCLEAR MEDICINE PET SKULL BASE TO THIGH TECHNIQUE: 8.36 mCi F-18 FDG was injected intravenously. Full-ring PET imaging was performed from the skull base to thigh after the radiotracer. CT data was obtained and used for attenuation correction and anatomic localization. Fasting blood glucose: 90 mg/dl COMPARISON:  CT AP 14/78/2956 FINDINGS: Mediastinal blood pool activity: SUV max 1.98 Liver activity: SUV max NA NECK: No hypermetabolic lymph nodes in the neck. Incidental CT findings: There is increased uptake within both lobes of thyroid gland, right greater than left. SUV max is equal to 7.40. CHEST: No hypermetabolic mediastinal or hilar nodes. No suspicious pulmonary nodules on the CT scan. Incidental CT findings: 5 mm perifissural nodule in the left mid lung is too small to characterize by PET-CT, image 57/4. Aortic atherosclerotic calcifications. ABDOMEN/PELVIS: There is no abnormal tracer uptake within the liver, pancreas, spleen, or adrenal glands. Multiple tracer avid retroperitoneal lymph nodes are identified. Index right retrocaval lymph node measures 1.1 cm within SUV max of 6.59, image 102/4. Bilateral tracer avid pelvic lymph nodes identified. Right common iliac lymph node measures 1.1 cm within SUV max 10.28. Left external iliac lymph node measures 1.4 cm within SUV max of 6.6, image 132/4. Dominant right pelvic sidewall lymph node measures 2.9 cm within SUV max of 12.79, image 131/4. Multiple tracer avid right inguinal lymph nodes are identified. Index right inguinal node measures 1.2 cm within SUV max of 3.73. Incidental CT findings: Aortic atherosclerosis. No ascites identified within the abdomen or pelvis. No suspicious peritoneal nodule. SKELETON: No focal hypermetabolic activity to suggest skeletal metastasis. Incidental CT findings: Status post left hip arthroplasty. Asymmetric subcutaneous edema is identified involving the visualized portions of the right lower extremity. IMPRESSION: 1.  Examination is positive for tracer avid adenopathy within the retroperitoneum, bilateral pelvis and right inguinal region. Findings are compatible with nodal metastasis. Primary neoplasm is not apparent on the current exam. 2. No signs of solid organ metastasis within the abdomen or pelvis. 3. No signs of tracer avid disease above the diaphragm. 4. Asymmetric subcutaneous edema is identified involving the visualized portions of the right lower extremity. 5. 5 mm perifissural nodule in the left mid lung is too small to characterize by PET-CT. 6. Increased uptake within both lobes of thyroid gland, right greater than left. Correlation with thyroid function tests advised. 7.  Aortic Atherosclerosis (ICD10-I70.0). Electronically Signed   By: Signa Kell M.D.   On: 11/18/2022 08:31   XR Wrist Complete Right  Result Date: 11/16/2022 Right wrist 3 views: Minimally displaced comminuted distal radius fracture intra-articular.  No other fractures identified.  CT ABDOMINAL MASS BIOPSY  Result Date: 11/11/2022 INDICATION: Pelvic lymphadenopathy EXAM: CT-guided core needle biopsy of pelvic lymph node MEDICATIONS: None. ANESTHESIA/SEDATION: Moderate (conscious) sedation was employed during this procedure. A total of Versed 1.5 mg and Fentanyl 100 mcg was administered intravenously. Moderate Sedation Time: 17 minutes. The patient's level of consciousness and vital signs were monitored continuously by radiology nursing throughout the procedure under my direct supervision. FLUOROSCOPY TIME:  N/a COMPLICATIONS: None immediate. PROCEDURE: Informed written consent was obtained from the patient after a thorough discussion of the procedural risks, benefits and alternatives. All questions were addressed. Maximal Sterile Barrier Technique was utilized including caps, mask, sterile gowns, sterile gloves, sterile drape, hand hygiene and skin antiseptic. A timeout was performed prior to the initiation of the procedure. The patient  was placed prone on the exam table. Limited CT of the abdomen and pelvis  was performed for planning purposes. This demonstrated large pelvic lymph node in the right hemipelvis. Skin entry site was marked, and the overlying skin was prepped and draped in the standard sterile fashion. Local analgesia was obtained with 1% lidocaine. Using intermittent CT fluoroscopy, a 17 gauge introducer needle was advanced towards the identified lesion. Subsequently, core needle biopsy was performed using an 18 gauge core biopsy device x7 total passes. Specimens were submitted in saline to pathology for further handling. Limited postprocedure imaging demonstrated no complicating feature. The patient tolerated the procedure well, and was transferred to recovery in stable condition. IMPRESSION: Successful CT-guided core needle biopsy of enlarged left pelvic lymph node. Electronically Signed   By: Olive Bass M.D.   On: 11/11/2022 13:36   CT ABDOMEN PELVIS W CONTRAST  Result Date: 10/25/2022 CLINICAL DATA:  Lymphadenopathy in edema of the right thigh. EXAM: CT ABDOMEN AND PELVIS WITH CONTRAST TECHNIQUE: Multidetector CT imaging of the abdomen and pelvis was performed using the standard protocol following bolus administration of intravenous contrast. RADIATION DOSE REDUCTION: This exam was performed according to the departmental dose-optimization program which includes automated exposure control, adjustment of the mA and/or kV according to patient size and/or use of iterative reconstruction technique. CONTRAST:  80mL OMNIPAQUE IOHEXOL 300 MG/ML  SOLN COMPARISON:  Extremity ultrasound October 23, 2022. FINDINGS: Lower chest: No acute abnormality. Hepatobiliary: No focal liver abnormality is seen. No gallstones, gallbladder wall thickening, or biliary dilatation. Pancreas: Unremarkable. No pancreatic ductal dilatation or surrounding inflammatory changes. Spleen: Normal in size without focal abnormality. Adrenals/Urinary Tract: Adrenal  glands are unremarkable. Kidneys are normal, without renal calculi, focal lesion, or hydronephrosis. Bladder is unremarkable. Stomach/Bowel: Stomach is within normal limits. The appendix is not identified with certainty but there are no secondary signs of appendicitis. No evidence of bowel wall thickening, distention, or inflammatory changes. Vascular/Lymphatic: Mild calcific atherosclerotic disease of the aorta. Pelvic lymphadenopathy with the most prominent lymph node along the right iliac chain measuring 5.6 x 2.8 cm. Reproductive: No evidence of adnexal masses. Other: Fat stranding along the right common femoral artery and vein with diminutive appearance of the femoral vein at the pelvic outlet. Prominent right inguinal lymph nodes. Musculoskeletal: Post left hip arthroplasty with intact hardware. Spondylosis of the lower lumbosacral spine. 8 mm sclerotic bone lesion within the right ischium IMPRESSION: 1. Pelvic lymphadenopathy with the most prominent lymph node along the right iliac chain measuring 5.6 x 2.8 cm. Metastatic lymphadenopathy or lymphoproliferative disease cannot be excluded. 2. 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 3. Prominent right inguinal lymph nodes. 4. 8 mm sclerotic bone lesion within the right ischium, indeterminate. 5. Aortic atherosclerosis. Aortic Atherosclerosis (ICD10-I70.0). Electronically Signed   By: Ted Mcalpine M.D.   On: 10/25/2022 16:48   US Venous Img Lower Unilateral Right (DVT)  Result Date: 10/23/2022 CLINICAL DATA:  Lymphadenopathy and edema of right thigh for 3.5 weeks EXAM: RIGHT LOWER EXTREMITY VENOUS DOPPLER ULTRASOUND TECHNIQUE: Gray-scale sonography with compression, as well as color and duplex ultrasound, were performed to evaluate the deep venous system(s) from the level of the common  femoral vein through the popliteal and proximal calf veins. COMPARISON:  None Available. FINDINGS: VENOUS Nonocclusive thrombus of the right common femoral vein and the saphenofemoral junction. No other filling defects to suggest DVT on grayscale or color Doppler imaging. Doppler waveforms show normal direction of  venous flow, normal respiratory plasticity and response to augmentation. Limited views of the contralateral common femoral vein are unremarkable. OTHER Multiple prominent palpable lymph nodes in the right groin measuring up to 2.1 x 1.4 x 0.9 cm. Small fluid collection in the right popliteal fossa characteristic in appearance and location for a Baker's cyst measuring 2.4 x 1.5 x 0.6 cm. Limitations: none IMPRESSION: 1. 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 2. Multiple prominent palpable lymph nodes in the right groin measuring up to 2.1 cm, nonspecific. 3. Small right popliteal fossa Baker's cyst. These results will be called to the ordering clinician or representative by the Radiologist Assistant, and communication documented in the PACS or [employer]. Electronically Signed   By: Jearld Lesch M.D.   On: 10/23/2022 20:19

## 2022-11-25 ENCOUNTER — Other Ambulatory Visit: Payer: Self-pay | Admitting: Hematology and Oncology

## 2022-11-25 ENCOUNTER — Other Ambulatory Visit (INDEPENDENT_AMBULATORY_CARE_PROVIDER_SITE_OTHER): Payer: Medicare PPO

## 2022-11-25 ENCOUNTER — Inpatient Hospital Stay (HOSPITAL_BASED_OUTPATIENT_CLINIC_OR_DEPARTMENT_OTHER): Payer: Medicare PPO | Admitting: Psychiatry

## 2022-11-25 ENCOUNTER — Ambulatory Visit: Payer: Medicare PPO | Admitting: Orthopaedic Surgery

## 2022-11-25 VITALS — BP 106/74 | HR 68 | Temp 97.8°F | Ht 62.99 in | Wt 153.8 lb

## 2022-11-25 DIAGNOSIS — C778 Secondary and unspecified malignant neoplasm of lymph nodes of multiple regions: Secondary | ICD-10-CM | POA: Diagnosis not present

## 2022-11-25 DIAGNOSIS — C801 Malignant (primary) neoplasm, unspecified: Secondary | ICD-10-CM | POA: Diagnosis not present

## 2022-11-25 DIAGNOSIS — S62101D Fracture of unspecified carpal bone, right wrist, subsequent encounter for fracture with routine healing: Secondary | ICD-10-CM

## 2022-11-25 DIAGNOSIS — M25531 Pain in right wrist: Secondary | ICD-10-CM | POA: Diagnosis not present

## 2022-11-25 DIAGNOSIS — R590 Localized enlarged lymph nodes: Secondary | ICD-10-CM

## 2022-11-25 DIAGNOSIS — R591 Generalized enlarged lymph nodes: Secondary | ICD-10-CM | POA: Diagnosis not present

## 2022-11-25 NOTE — Patient Instructions (Addendum)
It was a pleasure to see you in clinic today. - I will let you know when I see the biopsy result - MRI pelvis ordered - Return visit planned for after approximately 3 cycles of chemo  Plan on following up with Dr. Bertis Ruddy on Monday June 3. She is awaiting the results of PDL-1 on your recent biopsy. Dr. Bertis Ruddy said you do not need to hold your Eliquis before port a cath placement.  Thank you very much for allowing me to provide care for you today.  I appreciate your confidence in choosing our Gynecologic Oncology team at [facility].  If you have any questions about your visit today please call our office or send [place] a MyChart message and we will get back to you as soon as possible.

## 2022-11-25 NOTE — Progress Notes (Signed)
Gynecologic Oncology Return Clinic Visit  Date of Service: 11/25/2022 Referring Provider: [name], MD   Assessment & Plan: [name] is a 72 y.o. 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   Reviewed PET scan and IR biopsy results with patient.  Reviewed possible origins based on stains from biopsy include breast, GU and GYN.  Patient is up-to-date on mammograms; suspect breast is not likely.  Also, patient with urinalysis at last visit without hematuria, making GU also less likely.  Suspect that GYN is most likely etiology of her lymphadenopathy.  Discussed with patient that treatment for carcinoma of unknown origin is treated similarly to carcinoma from gynecologic origin.  Treatment would involve systemic chemotherapy with platinum/taxane combination.  Also discussed that if we suspect endometrial origin, an immunotherapy could be considered in addition to the platinum/taxane doublet.  This may be considered even if we do not know origin pending testing that Dr. Bertis Ruddy has requested.  Additionally recommended endometrial biopsy today as another way to discern if disease originating in the endometrium.  See procedure note below.  Also, recommend an MRI of the pelvis to better characterize the pelvic structures as a possible way to discern origin.  Described to patient typical treatment for metastatic GYN cancer involves a combination of chemotherapy and surgery.  Given that she has multiple levels of lymph nodes involved, recommend systemic chemotherapy first.  Interval imaging will be performed after 3 cycles.  If appropriate response to treatment making debulking surgery feasible, would proceed with robotic hysterectomy, BSO, debulking following 3-4 cycles.  At times, additional chemotherapy is given prior to surgery if needed.  Patient will follow-up with  Dr. Emeline Darling such to initiate chemotherapy.  We will have patient return following interval imaging after 3 cycles to discern if interval debulking feasible at that time.  RTC after 3 cycles.  [name], MD Gynecologic Oncology   Medical Decision Making I personally spent  TOTAL 33 minutes face-to-face and non-face-to-face in the care of this patient, which includes all pre, intra, and post visit time on the date of service.   ----------------------- Reason for Visit: Follow-up  Treatment History: Oncology History  Uterine cancer (HCC)  10/23/2022 Imaging   1. 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 2. Multiple prominent palpable lymph nodes in the right groin measuring up to 2.1 cm, nonspecific. 3. Small right popliteal fossa Baker's cyst.   10/25/2022 Imaging   Ct imaging 1. Pelvic lymphadenopathy with the most prominent lymph node along the right iliac chain measuring 5.6 x 2.8 cm. Metastatic lymphadenopathy or lymphoproliferative disease cannot be excluded.  2. 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 3. Prominent right inguinal lymph nodes. 4. 8 mm sclerotic bone lesion within the right ischium, indeterminate. 5. Aortic atherosclerosis.   Aortic Atherosclerosis (ICD10-I70.0).       11/11/2022 Pathology Results   SURGICAL PATHOLOGY CASE: [id number] PATIENT: [name] Surgical Pathology Report  Specimen Submitted: A. Lymph node, right pelvic  Clinical History: Pelvic lymphadenopathy  DIAGNOSIS: A. LYMPH NODE, RIGHT PELVIC; CT-GUIDED CORE NEEDLE BIOPSY: - METASTATIC POORLY DIFFERENTIATED CARCINOMA, SEE COMMENT.  Comment: Immunohistochemical studies show tumor cells to be positive for CK7, MOC-31, GATA-3, CK5/6, and calretinin (subset). P16 is  strongly and  diffusely positive. P53 demonstrates an absence of staining within tumor cells, indicative of a null staining pattern. PAX-8, WT-1, D240, TTF-1, cdx-2, and p40 are negative. The pattern of immunohistochemical staining is non-specific. Based on the morphologic features and pattern of immunohistochemical staining the differential diagnosis includes metastatic urothelial carcinoma, metastatic breast carcinoma, and metastatic serous carcinoma of gyn origin. Correlation with radiographic findings is required.  There is sufficient tissue present for ancillary molecular testing.  IHC slides were prepared by [facility], [facility], [place]. All controls stained appropriately.   11/16/2022 PET scan   1. Examination is positive for tracer avid adenopathy within the retroperitoneum, bilateral pelvis and right inguinal region. Findings are compatible with nodal metastasis. Primary neoplasm is not apparent on the current exam.  2. No signs of solid organ metastasis within the abdomen or pelvis. 3. No signs of tracer avid disease above the diaphragm. 4. Asymmetric subcutaneous edema is identified involving the visualized portions of the right lower extremity. 5. 5 mm perifissural nodule in the left mid lung is too small to characterize by PET-CT. 6. Increased uptake within both lobes of thyroid gland, right greater than left. Correlation with thyroid function tests advised. 7.  Aortic Atherosclerosis (ICD10-I70.0).   11/19/2022 Initial Diagnosis   Gynecologic malignancy (HCC)   11/20/2022 Cancer Staging   Staging form: Corpus Uteri - Carcinoma and Carcinosarcoma, AJCC 8th Edition - Clinical stage from 11/20/2022: FIGO Stage IVB (cTX, cN2a, pM1) - Signed by Artis Delay, MD on 11/20/2022 Stage prefix: Initial diagnosis     Interval History: Since her last visit, she had seen Dr. Bertis Ruddy for discussion of systemic treatment. She also broke her arm which is currently in  a splint.  Confirmed that pt has not experienced any bleeding or spotting since menopause.     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   Top of Left Hand(Tx p Bx)    Past Surgical History:  Procedure Laterality Date   CARPAL TUNNEL RELEASE     BILAT     CATARACT EXTRACTION     BILAT    ECTOPIC PREGNANCY SURGERY     in the setting of prior BTL, lapartomy   EYE SURGERY     KNEE CARTILAGE SURGERY     RIGHT    (MENISCUS)   MOHS SURGERY     left nose/inner eye for basal cell   TONSILLECTOMY     TOTAL HIP ARTHROPLASTY Left 12/19/2015   TOTAL HIP ARTHROPLASTY Left 12/19/2015   Procedure: LEFT TOTAL HIP ARTHROPLASTY;  Surgeon: Valeria Batman, MD;  Location: [facility] OR;  Service: Orthopedics;  Laterality: Left;   TRIGGER FINGER RELEASE     TUBAL LIGATION      Family History  Problem Relation Age of Onset   Uterine cancer Mother    Heart attack Mother    Hypertension Mother    Kidney cancer Father    Lung cancer Father    Diabetes Sister    Thyroid disease Neg Hx    Colon cancer Neg Hx    Breast cancer Neg Hx    Ovarian cancer  Neg Hx    Endometrial cancer Neg Hx    Pancreatic cancer Neg Hx    Prostate cancer Neg Hx     Social History   Socioeconomic History   Marital status: Married    Spouse name: Not on file   Number of children: Not on file   Years of education: Not on file   Highest education level: Not on file  Occupational History   Not on file  Tobacco Use   Smoking status: Never   Smokeless tobacco: Never  Vaping Use   Vaping Use: Never used  Substance and Sexual Activity   Alcohol use: Yes     Alcohol/week: 0.0 standard drinks of alcohol    Comment: Rare   Drug use: No   Sexual activity: Yes    Birth control/protection: Surgical, Post-menopausal    Comment: Tubal lig-1st intercourse 72 yo-Fewer than 5 partners  Other Topics Concern   Not on file  Social History Narrative   Not on file   Social Determinants of Health   Financial Resource Strain: Not on file  Food Insecurity: No Food Insecurity (10/30/2022)   Hunger Vital Sign    Worried About Running Out of Food in the Last Year: Never true    Ran Out of Food in the Last Year: Never true  Transportation Needs: Not on file  Physical Activity: Not on file  Stress: Not on file  Social Connections: Not on file    Current Medications:  Current Outpatient Medications:    APIXABAN (ELIQUIS) VTE STARTER PACK (10MG  AND 5MG ), Take as directed on package: start with two-5mg  tablets twice daily for 7 days. On day 8, switch to one-5mg  tablet twice daily., Disp: 1 each, Rfl: 0   Calcium Carb-Cholecalciferol (CALCIUM 1000 + D PO), Take by mouth., Disp: , Rfl:    dexamethasone (DECADRON) 4 MG tablet, Take 2 tabs at the night before and 2 tab the morning of chemotherapy, every 3 weeks, by mouth x 6 cycles, Disp: 24 tablet, Rfl: 6   diclofenac sodium (VOLTAREN) 1 % GEL, Apply 2-4 g topically 4 (four) times daily., Disp: 3 Tube, Rfl: 3   Glucosamine 500 MG CAPS, 1 capsule Orally twice a day, Disp: , Rfl:    Glucosamine-Chondroit-Vit C-Mn (GLUCOSAMINE CHONDR 1500 COMPLX PO), Take by mouth., Disp: , Rfl:    levothyroxine (SYNTHROID) 75 MCG tablet, TAKE ONE (1) TABLET BY MOUTH EVERY DAY, Disp: 45 tablet, Rfl: 2   lidocaine-prilocaine (EMLA) cream, Apply 1 Application topically as needed., Disp: 30 g, Rfl: 3   Multiple Vitamin (MULTIVITAMIN) tablet, Take 1 tablet by mouth daily., Disp: , Rfl:    ondansetron (ZOFRAN) 8 MG tablet, Take 1 tablet (8 mg total) by mouth every 8 (eight) hours as needed for nausea., Disp: 30 tablet, Rfl: 3    prochlorperazine (COMPAZINE) 10 MG tablet, Take 1 tablet (10 mg total) by mouth every 6 (six) hours as needed for nausea or vomiting., Disp: 30 tablet, Rfl: 0   Turmeric 400 MG CAPS, Take by mouth., Disp: , Rfl:   Review of Symptoms: Complete 10-system review is negative except as above in Interval History.  Physical Exam: BP 106/74 (BP Location: Left Arm, Patient Position: Sitting)   Pulse 68   Temp 97.8 F (36.6 C) (Oral)   Ht 5' 2.99" (1.6 m)   Wt 153 lb 12.8 oz (69.8 kg)   SpO2 100%   BMI 27.25 kg/m  General: Alert, oriented, no acute distress. HEENT: Normocephalic, atraumatic. Neck symmetric  without masses. Sclera anicteric.  Chest: Normal work of breathing. Clear to auscultation bilaterally.   Cardiovascular: Regular rate and rhythm, no murmurs. Abdomen: Soft, nontender.  Normoactive bowel sounds.  No masses appreciated.   Extremities: Right arm in splint. Lowe extremities warm, well perfused.  No edema bilaterally. Skin: No rashes or lesions noted. GU: Normal appearing external genitalia without erythema, excoriation, or lesions.  Speculum exam reveals normal vagina and cervix.  Bimanual exam reveals normal cervix and small mobile uterus. NO adnexal masses palpated.  Exam chaperoned by Warner Mccreedy, NP   EMB Procedure: After appropriate verbal informed consent was obtained, a timeout was performed. A sterile speculum was placed in the vagina, and the area was cleaned with betadine x3. A single-tooth tenaculum was placed on the anterior lip of the cervix. The os finder was used to dilate the cervix. An endometrial biopsy pipelle was advanced carefully to the uterine fundus which sounded to 7cm. A scant sample was obtained over 2 passes. The tenaculum was removed, and tenaculum sites were noted to be hemostatic. The speculum was removed from the vagina. The patient tolerated the procedure well.   UPT: not indicated   Laboratory & Radiologic Studies: Surgical pathology  (11/11/22): DIAGNOSIS: A. LYMPH NODE, RIGHT PELVIC; CT-GUIDED CORE NEEDLE BIOPSY: - METASTATIC POORLY DIFFERENTIATED CARCINOMA, SEE COMMENT.  Comment: Immunohistochemical studies show tumor cells to be positive for CK7, MOC-31, GATA-3, CK5/6, and calretinin (subset). P16 is strongly and diffusely positive. P53 demonstrates an absence of staining within tumor cells, indicative of a null staining pattern. PAX-8, WT-1, D240, TTF-1, cdx-2, and p40 are negative. The pattern of immunohistochemical staining is non-specific. Based on the morphologic features and pattern of immunohistochemical staining the differential diagnosis includes metastatic urothelial carcinoma, metastatic breast carcinoma, and metastatic serous carcinoma of gyn origin. Correlation with radiographic findings is required.  There is sufficient tissue present for ancillary molecular testing.   NM PET Image Restage (PS) Skull Base to Thigh (F-18 FDG) 11/16/2022  Narrative CLINICAL DATA:  Initial treatment strategy for adenopathy.  EXAM: NUCLEAR MEDICINE PET SKULL BASE TO THIGH  TECHNIQUE: 8.36 mCi F-18 FDG was injected intravenously. Full-ring PET imaging was performed from the skull base to thigh after the radiotracer. CT data was obtained and used for attenuation correction and anatomic localization.  Fasting blood glucose: 90 mg/dl  COMPARISON:  CT AP 16/03/9603  FINDINGS: Mediastinal blood pool activity: SUV max 1.98  Liver activity: SUV max NA  NECK: No hypermetabolic lymph nodes in the neck.  Incidental CT findings: There is increased uptake within both lobes of thyroid gland, right greater than left. SUV max is equal to 7.40.  CHEST: No hypermetabolic mediastinal or hilar nodes. No suspicious pulmonary nodules on the CT scan.  Incidental CT findings: 5 mm perifissural nodule in the left mid lung is too small to characterize by PET-CT, image 57/4. Aortic atherosclerotic  calcifications.  ABDOMEN/PELVIS: There is no abnormal tracer uptake within the liver, pancreas, spleen, or adrenal glands.  Multiple tracer avid retroperitoneal lymph nodes are identified. Index right retrocaval lymph node measures 1.1 cm within SUV max of 6.59, image 102/4.  Bilateral tracer avid pelvic lymph nodes identified. Right common iliac lymph node measures 1.1 cm within SUV max 10.28.  Left external iliac lymph node measures 1.4 cm within SUV max of 6.6, image 132/4.  Dominant right pelvic sidewall lymph node measures 2.9 cm within SUV max of 12.79, image 131/4.  Multiple tracer avid right inguinal lymph nodes are identified. Index right  inguinal node measures 1.2 cm within SUV max of 3.73.  Incidental CT findings: Aortic atherosclerosis. No ascites identified within the abdomen or pelvis. No suspicious peritoneal nodule.  SKELETON: No focal hypermetabolic activity to suggest skeletal metastasis.  Incidental CT findings: Status post left hip arthroplasty. Asymmetric subcutaneous edema is identified involving the visualized portions of the right lower extremity.  IMPRESSION: 1. Examination is positive for tracer avid adenopathy within the retroperitoneum, bilateral pelvis and right inguinal region. Findings are compatible with nodal metastasis. Primary neoplasm is not apparent on the current exam. 2. No signs of solid organ metastasis within the abdomen or pelvis. 3. No signs of tracer avid disease above the diaphragm. 4. Asymmetric subcutaneous edema is identified involving the visualized portions of the right lower extremity. 5. 5 mm perifissural nodule in the left mid lung is too small to characterize by PET-CT. 6. Increased uptake within both lobes of thyroid gland, right greater than left. Correlation with thyroid function tests advised. 7.  Aortic Atherosclerosis (ICD10-I70.0).   Electronically Signed By: Signa Kell M.D. On: 11/18/2022 08:31

## 2022-11-25 NOTE — Progress Notes (Signed)
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  She has been in a sugar-tong splint and we are seeing her today in follow-up.  She is doing well overall.  The splint has been removed today.  X-rays show that the fracture remains nondisplaced of the radial styloid.  The remainder of her wrist x-rays look good.  Clinically her wrist also appears straight and she is neurovascularly intact.  We will have her placed in a thumb spica short arm cast today.  She needs to keep this clean and dry and we will see her back in 2 weeks to have this cast removed and new x-rays of her right wrist.  Hopefully then we can put her in a Velcro thumb spica splint.  She agrees with this treatment plan.

## 2022-11-26 ENCOUNTER — Telehealth: Payer: Self-pay | Admitting: Hematology and Oncology

## 2022-11-26 LAB — SURGICAL PATHOLOGY

## 2022-11-26 NOTE — Telephone Encounter (Signed)
Responding to patients voicemail regarding appointments and also clarifying which appointments are and why each appointment is different.

## 2022-11-30 ENCOUNTER — Other Ambulatory Visit: Payer: Self-pay | Admitting: Hematology and Oncology

## 2022-11-30 ENCOUNTER — Inpatient Hospital Stay: Payer: Medicare PPO | Admitting: Hematology and Oncology

## 2022-11-30 ENCOUNTER — Encounter: Payer: Self-pay | Admitting: Hematology and Oncology

## 2022-11-30 ENCOUNTER — Other Ambulatory Visit: Payer: Self-pay

## 2022-11-30 ENCOUNTER — Inpatient Hospital Stay: Payer: Medicare PPO | Attending: Gynecologic Oncology

## 2022-11-30 ENCOUNTER — Inpatient Hospital Stay (HOSPITAL_BASED_OUTPATIENT_CLINIC_OR_DEPARTMENT_OTHER): Payer: Medicare PPO | Admitting: Hematology and Oncology

## 2022-11-30 ENCOUNTER — Inpatient Hospital Stay: Payer: Medicare PPO

## 2022-11-30 VITALS — BP 120/63 | HR 68 | Temp 98.1°F | Resp 16 | Ht 62.99 in | Wt 156.4 lb

## 2022-11-30 DIAGNOSIS — I82411 Acute embolism and thrombosis of right femoral vein: Secondary | ICD-10-CM | POA: Diagnosis not present

## 2022-11-30 DIAGNOSIS — D649 Anemia, unspecified: Secondary | ICD-10-CM

## 2022-11-30 DIAGNOSIS — Z86718 Personal history of other venous thrombosis and embolism: Secondary | ICD-10-CM | POA: Insufficient documentation

## 2022-11-30 DIAGNOSIS — C540 Malignant neoplasm of isthmus uteri: Secondary | ICD-10-CM | POA: Diagnosis present

## 2022-11-30 DIAGNOSIS — R319 Hematuria, unspecified: Secondary | ICD-10-CM

## 2022-11-30 DIAGNOSIS — Z7901 Long term (current) use of anticoagulants: Secondary | ICD-10-CM | POA: Insufficient documentation

## 2022-11-30 DIAGNOSIS — C55 Malignant neoplasm of uterus, part unspecified: Secondary | ICD-10-CM

## 2022-11-30 DIAGNOSIS — Z5111 Encounter for antineoplastic chemotherapy: Secondary | ICD-10-CM | POA: Diagnosis present

## 2022-11-30 DIAGNOSIS — Z79899 Other long term (current) drug therapy: Secondary | ICD-10-CM | POA: Diagnosis not present

## 2022-11-30 DIAGNOSIS — C579 Malignant neoplasm of female genital organ, unspecified: Secondary | ICD-10-CM

## 2022-11-30 LAB — CMP (CANCER CENTER ONLY)
ALT: 8 U/L (ref 0–44)
AST: 17 U/L (ref 15–41)
Albumin: 3.9 g/dL (ref 3.5–5.0)
Alkaline Phosphatase: 78 U/L (ref 38–126)
Anion gap: 5 (ref 5–15)
BUN: 15 mg/dL (ref 8–23)
CO2: 31 mmol/L (ref 22–32)
Calcium: 9.5 mg/dL (ref 8.9–10.3)
Chloride: 102 mmol/L (ref 98–111)
Creatinine: 0.8 mg/dL (ref 0.44–1.00)
GFR, Estimated: >60 mL/min (ref >60)
Glucose, Bld: 106 mg/dL — ABNORMAL HIGH (ref 70–99)
Potassium: 3.9 mmol/L (ref 3.5–5.1)
Sodium: 138 mmol/L (ref 135–145)
Total Bilirubin: 0.5 mg/dL (ref 0.3–1.2)
Total Protein: 7.2 g/dL (ref 6.5–8.1)

## 2022-11-30 LAB — URINALYSIS, COMPLETE (UACMP) WITH MICROSCOPIC
Bacteria, UA: NONE SEEN
Bilirubin Urine: NEGATIVE
Glucose, UA: NEGATIVE mg/dL
Ketones, ur: NEGATIVE mg/dL
Leukocytes,Ua: NEGATIVE
Nitrite: NEGATIVE
Protein, ur: NEGATIVE mg/dL
Specific Gravity, Urine: 1.008 (ref 1.005–1.030)
pH: 5 (ref 5.0–8.0)

## 2022-11-30 LAB — URINE CULTURE

## 2022-11-30 LAB — CBC WITH DIFFERENTIAL (CANCER CENTER ONLY)
Abs Immature Granulocytes: 0.01 10*3/uL (ref 0.00–0.07)
Basophils Absolute: 0 10*3/uL (ref 0.0–0.1)
Basophils Relative: 1 %
Eosinophils Absolute: 0.1 10*3/uL (ref 0.0–0.5)
Eosinophils Relative: 2 %
HCT: 35 % — ABNORMAL LOW (ref 36.0–46.0)
Hemoglobin: 11.8 g/dL — ABNORMAL LOW (ref 12.0–15.0)
Immature Granulocytes: 0 %
Lymphocytes Relative: 21 %
Lymphs Abs: 1.2 10*3/uL (ref 0.7–4.0)
MCH: 30.5 pg (ref 26.0–34.0)
MCHC: 33.7 g/dL (ref 30.0–36.0)
MCV: 90.4 fL (ref 80.0–100.0)
Monocytes Absolute: 0.5 10*3/uL (ref 0.1–1.0)
Monocytes Relative: 9 %
Neutro Abs: 3.8 10*3/uL (ref 1.7–7.7)
Neutrophils Relative %: 67 %
Platelet Count: 286 10*3/uL (ref 150–400)
RBC: 3.87 MIL/uL (ref 3.87–5.11)
RDW: 12.3 % (ref 11.5–15.5)
WBC Count: 5.6 10*3/uL (ref 4.0–10.5)
nRBC: 0 % (ref 0.0–0.2)

## 2022-11-30 LAB — IRON AND IRON BINDING CAPACITY (CC-WL,HP ONLY)
Iron: 45 ug/dL (ref 28–170)
Saturation Ratios: 16 % (ref 10.4–31.8)
TIBC: 274 ug/dL (ref 250–450)
UIBC: 229 ug/dL (ref 148–442)

## 2022-11-30 LAB — VITAMIN B12: Vitamin B-12: 574 pg/mL (ref 180–914)

## 2022-11-30 NOTE — Assessment & Plan Note (Addendum)
She has mild intermittent bleeding and it is difficult to tell whether it is coming from her urinary tract or vaginal wall I recommend urinalysis and urine culture We discussed the pathophysiology of DVT in the setting of malignancy She will need to stay on anticoagulation therapy for minimum 6 months or longer until her underlying malignancy is treated

## 2022-11-30 NOTE — Progress Notes (Signed)
START ON PATHWAY REGIMEN - Uterine     A cycle is every 21 days:     Paclitaxel      Carboplatin   **Always confirm dose/schedule in your pharmacy ordering system**  Patient Characteristics: Serous Carcinoma, Newly Diagnosed (Clinical Staging), Nonsurgical Candidate, Stage III or IV, HER2 Negative/Unknown, MSS/pMMR Histology: Serous Carcinoma Therapeutic Status: Newly Diagnosed (Clinical Staging) AJCC M Category: pM1 Surgical Candidacy: Nonsurgical Candidate AJCC 8 Stage Grouping: IVB AJCC T Category: cTX AJCC N Category: cN2 HER2 Status: Did Not Order Test Microsatellite/Mismatch Repair Status: MSS/pMMR Intent of Therapy: Curative Intent, Discussed with Patient

## 2022-11-30 NOTE — Assessment & Plan Note (Signed)
I have reviewed all available results so far ER test on her previous biopsy came back negative MMR and PD-L1 still pending Her recent endometrial biopsy was nondiagnostic MRI of the pelvis is scheduled 2 weeks from now Overall, the patient has diagnosis of cancer of unknown primary, likely from the GYN source, likely from uterine in origin I recommend we proceed with systemic chemotherapy with carboplatin and paclitaxel We discussed risk, benefits, side effects of treatment and she is in agreement to proceed We discussed premedication with dexamethasone  She is scheduled for port placement in 2 days The patient had plan for vacation end of the week with family We will try to see if we can accommodate her needs to get treatment started by Friday If not, we will get her treatment schedule next week I recommend minimum 3 cycles of treatment before repeating imaging study for to assess response to therapy We will continue to follow-up on results of her molecular studies

## 2022-11-30 NOTE — Progress Notes (Signed)
[facility] OFFICE PROGRESS NOTE  Patient Care Team: [name], MD as PCP - General (Family Medicine)  ASSESSMENT & PLAN:  Uterine cancer [facility]) I have reviewed all available results so far ER test on her previous biopsy came back negative MMR and PD-L1 still pending Her recent endometrial biopsy was nondiagnostic MRI of the pelvis is scheduled 2 weeks from now Overall, the patient has diagnosis of cancer of unknown primary, likely from the GYN source, likely from uterine in origin I recommend we proceed with systemic chemotherapy with carboplatin and paclitaxel We discussed risk, benefits, side effects of treatment and she is in agreement to proceed We discussed premedication with dexamethasone  She is scheduled for port placement in 2 days The patient had plan for vacation end of the week with family We will try to see if we can accommodate her needs to get treatment started by Friday If not, we will get her treatment schedule next week I recommend minimum 3 cycles of treatment before repeating imaging study for to assess response to therapy We will continue to follow-up on results of her molecular studies  Acute deep vein thrombosis (DVT) of right lower extremity (HCC) She has mild intermittent bleeding and it is difficult to tell whether it is coming from her urinary tract or vaginal wall I recommend urinalysis and urine culture We discussed the pathophysiology of DVT in the setting of malignancy She will need to stay on anticoagulation therapy for minimum 6 months or longer until her underlying malignancy is treated  Mild anemia She has chronic history of intermittent anemia I will order additional workup for anemia  Orders Placed This Encounter  Procedures   Urine Culture    Standing Status:   Future    Number of Occurrences:   1    Standing Expiration Date:   11/30/2023   Urinalysis, Complete w Microscopic    Standing Status:   Future    Number of  Occurrences:   1    Standing Expiration Date:   11/30/2023   CBC with Differential (Cancer Center Only)    Standing Status:   Future    Standing Expiration Date:   12/04/2023   CMP (Cancer Center only)    Standing Status:   Future    Standing Expiration Date:   12/04/2023   CBC with Differential (Cancer Center Only)    Standing Status:   Future    Standing Expiration Date:   12/25/2023   CMP (Cancer Center only)    Standing Status:   Future    Standing Expiration Date:   12/25/2023   CBC with Differential (Cancer Center Only)    Standing Status:   Future    Standing Expiration Date:   01/15/2024   CMP (Cancer Center only)    Standing Status:   Future    Standing Expiration Date:   01/15/2024   CBC with Differential (Cancer Center Only)    Standing Status:   Future    Standing Expiration Date:   02/05/2024   CMP (Cancer Center only)    Standing Status:   Future    Standing Expiration Date:   02/05/2024   CBC with Differential (Cancer Center Only)    Standing Status:   Future    Standing Expiration Date:   02/26/2024   CMP (Cancer Center only)    Standing Status:   Future    Standing Expiration Date:   02/26/2024   CBC with Differential (Cancer Center Only)  Standing Status:   Future    Standing Expiration Date:   03/18/2024   CMP (Cancer Center only)    Standing Status:   Future    Standing Expiration Date:   03/18/2024    All questions were answered. The patient knows to call the clinic with any problems, questions or concerns. The total time spent in the appointment was 40 minutes encounter with patients including review of chart and various tests results, discussions about plan of care and coordination of care plan   Artis Delay, MD 11/30/2022 3:22 PM  INTERVAL HISTORY: Please see below for problem oriented charting. she returns for discussion about plan of care Her recent endometrial biopsy was nondiagnostic Over the weekend, she had passage of intermittent vaginal spotting as well  as blood clot after she urinate but she cannot tell whether it is coming from her urinary tract of vaginal source She continues to have intermittent discomfort in her right lower extremity with edema We discussed test results and logistics of treatment We discussed and review side effects of treatment again  REVIEW OF SYSTEMS:   Constitutional: Denies fevers, chills or abnormal weight loss Eyes: Denies blurriness of vision Ears, nose, mouth, throat, and face: Denies mucositis or sore throat Respiratory: Denies cough, dyspnea or wheezes Cardiovascular: Denies palpitation, chest discomfort or lower extremity swelling Gastrointestinal:  Denies nausea, heartburn or change in bowel habits Skin: Denies abnormal skin rashes Lymphatics: Denies new lymphadenopathy or easy bruising Neurological:Denies numbness, tingling or new weaknesses Behavioral/Psych: Mood is stable, no new changes  All other systems were reviewed with the patient and are negative.  I have reviewed the past medical history, past surgical history, social history and family history with the patient and they are unchanged from previous note.  ALLERGIES:  is allergic to sulfa antibiotics.  MEDICATIONS:  Current Outpatient Medications  Medication Sig Dispense Refill   APIXABAN (ELIQUIS) VTE STARTER PACK (10MG  AND 5MG ) Take as directed on package: start with two-5mg  tablets twice daily for 7 days. On day 8, switch to one-5mg  tablet twice daily. 1 each 0   Calcium Carb-Cholecalciferol (CALCIUM 1000 + D PO) Take by mouth.     dexamethasone (DECADRON) 4 MG tablet Take 2 tabs at the night before and 2 tab the morning of chemotherapy, every 3 weeks, by mouth x 6 cycles 24 tablet 6   diclofenac sodium (VOLTAREN) 1 % GEL Apply 2-4 g topically 4 (four) times daily. 3 Tube 3   Glucosamine 500 MG CAPS 1 capsule Orally twice a day     Glucosamine-Chondroit-Vit C-Mn (GLUCOSAMINE CHONDR 1500 COMPLX PO) Take by mouth.     levothyroxine  (SYNTHROID) 75 MCG tablet TAKE ONE (1) TABLET BY MOUTH EVERY DAY 45 tablet 2   lidocaine-prilocaine (EMLA) cream Apply 1 Application topically as needed. 30 g 3   Multiple Vitamin (MULTIVITAMIN) tablet Take 1 tablet by mouth daily.     ondansetron (ZOFRAN) 8 MG tablet Take 1 tablet (8 mg total) by mouth every 8 (eight) hours as needed for nausea. 30 tablet 3   prochlorperazine (COMPAZINE) 10 MG tablet Take 1 tablet (10 mg total) by mouth every 6 (six) hours as needed for nausea or vomiting. 30 tablet 0   Turmeric 400 MG CAPS Take by mouth.     No current facility-administered medications for this visit.    SUMMARY OF ONCOLOGIC HISTORY: Oncology History Overview Note  Er neg   Uterine cancer (HCC)  10/23/2022 Imaging   1. Nonocclusive thrombus of the  right common femoral vein and saphenofemoral junction. 2. Multiple prominent palpable lymph nodes in the right groin measuring up to 2.1 cm, nonspecific. 3. Small right popliteal fossa Baker's cyst.   10/25/2022 Imaging   Ct imaging 1. Pelvic lymphadenopathy with the most prominent lymph node along the right iliac chain measuring 5.6 x 2.8 cm. Metastatic lymphadenopathy or lymphoproliferative disease cannot be excluded.  2. 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 3. Prominent right inguinal lymph nodes. 4. 8 mm sclerotic bone lesion within the right ischium, indeterminate. 5. Aortic atherosclerosis.   Aortic Atherosclerosis (ICD10-I70.0).       11/11/2022 Pathology Results   SURGICAL PATHOLOGY CASE: [id number] PATIENT: Yeslin Cleverly Surgical Pathology Report  Specimen Submitted: A. Lymph node, right pelvic  Clinical History: Pelvic lymphadenopathy  DIAGNOSIS: A. LYMPH NODE, RIGHT PELVIC; CT-GUIDED CORE NEEDLE BIOPSY: - METASTATIC POORLY DIFFERENTIATED  CARCINOMA, SEE COMMENT.  Comment: Immunohistochemical studies show tumor cells to be positive for CK7, MOC-31, GATA-3, CK5/6, and calretinin (subset). P16 is strongly and diffusely positive. P53 demonstrates an absence of staining within tumor cells, indicative of a null staining pattern. PAX-8, WT-1, D240, TTF-1, cdx-2, and p40 are negative. The pattern of immunohistochemical staining is non-specific. Based on the morphologic features and pattern of immunohistochemical staining the differential diagnosis includes metastatic urothelial carcinoma, metastatic breast carcinoma, and metastatic serous carcinoma of gyn origin. Correlation with radiographic findings is required.  There is sufficient tissue present for ancillary molecular testing.  IHC slides were prepared by [facility], [place], [place]. All controls stained appropriately.   11/16/2022 PET scan   1. Examination is positive for tracer avid adenopathy within the retroperitoneum, bilateral pelvis and right inguinal region. Findings are compatible with nodal metastasis. Primary neoplasm is not apparent on the current exam.  2. No signs of solid organ metastasis within the abdomen or pelvis. 3. No signs of tracer avid disease above the diaphragm. 4. Asymmetric subcutaneous edema is identified involving the visualized portions of the right lower extremity. 5. 5 mm perifissural nodule in the left mid lung is too small to characterize by PET-CT. 6. Increased uptake within both lobes of thyroid gland, right greater than left. Correlation with thyroid function tests advised. 7.  Aortic Atherosclerosis (ICD10-I70.0).   11/19/2022 Initial Diagnosis   Gynecologic malignancy (HCC)   11/20/2022 Cancer Staging   Staging form: Corpus Uteri - Carcinoma and Carcinosarcoma, AJCC 8th Edition - Clinical stage from 11/20/2022: FIGO Stage IVB (cTX, cN2a, pM1) - Signed by Artis Delay, MD on 11/20/2022 Stage prefix: Initial  diagnosis   12/04/2022 -  Chemotherapy   Patient is on Treatment Plan : UTERINE Carboplatin AUC 6 + Paclitaxel q21d       PHYSICAL EXAMINATION: ECOG PERFORMANCE STATUS: 1 - Symptomatic but completely ambulatory  Vitals:   11/30/22 1443  BP: 120/63  Pulse: 68  Resp: 16  Temp: 98.1 F (36.7 C)  SpO2: 96%   Filed Weights   11/30/22 1443  Weight: 156 lb 6 oz (70.9 kg)    GENERAL:alert, no distress and comfortable NEURO: alert & oriented x 3 with fluent speech, no focal motor/sensory deficits  LABORATORY DATA:  I have reviewed the data as listed    Component Value Date/Time   NA 138 10/23/2022 2108   K 3.9 10/23/2022 2108   CL 102 10/23/2022 2108   CO2  28 10/23/2022 2108   GLUCOSE 97 10/23/2022 2108   BUN 15 10/23/2022 2108   CREATININE 0.84 10/23/2022 2108   CREATININE 0.83 12/14/2016 1247   CALCIUM 9.7 10/23/2022 2108   PROT 6.5 12/14/2016 1247   ALBUMIN 3.9 12/14/2016 1247   AST 18 12/14/2016 1247   ALT 11 12/14/2016 1247   ALKPHOS 83 12/14/2016 1247   BILITOT 0.4 12/14/2016 1247   GFRNONAA >60 10/23/2022 2108   GFRNONAA 81 11/28/2015 1516   GFRAA >60 12/21/2015 0432   GFRAA >89 11/28/2015 1516    No results found for: "SPEP", "UPEP"  Lab Results  Component Value Date   WBC 4.5 11/11/2022   NEUTROABS 2,604 12/14/2016   HGB 12.9 11/11/2022   HCT 40.3 11/11/2022   MCV 92.0 11/11/2022   PLT 295 11/11/2022      Chemistry      Component Value Date/Time   NA 138 10/23/2022 2108   K 3.9 10/23/2022 2108   CL 102 10/23/2022 2108   CO2 28 10/23/2022 2108   BUN 15 10/23/2022 2108   CREATININE 0.84 10/23/2022 2108   CREATININE 0.83 12/14/2016 1247      Component Value Date/Time   CALCIUM 9.7 10/23/2022 2108   ALKPHOS 83 12/14/2016 1247   AST 18 12/14/2016 1247   ALT 11 12/14/2016 1247   BILITOT 0.4 12/14/2016 1247       RADIOGRAPHIC STUDIES: I have personally reviewed the radiological images as listed and agreed with the findings in the  report. XR Wrist Complete Right  Result Date: 11/25/2022 3 views of the right wrist show a nondisplaced radial styloid fracture.  NM PET Image Restage (PS) Skull Base to Thigh (F-18 FDG)  Result Date: 11/18/2022 CLINICAL DATA:  Initial treatment strategy for adenopathy. EXAM: NUCLEAR MEDICINE PET SKULL BASE TO THIGH TECHNIQUE: 8.36 mCi F-18 FDG was injected intravenously. Full-ring PET imaging was performed from the skull base to thigh after the radiotracer. CT data was obtained and used for attenuation correction and anatomic localization. Fasting blood glucose: 90 mg/dl COMPARISON:  CT AP 16/03/9603 FINDINGS: Mediastinal blood pool activity: SUV max 1.98 Liver activity: SUV max NA NECK: No hypermetabolic lymph nodes in the neck. Incidental CT findings: There is increased uptake within both lobes of thyroid gland, right greater than left. SUV max is equal to 7.40. CHEST: No hypermetabolic mediastinal or hilar nodes. No suspicious pulmonary nodules on the CT scan. Incidental CT findings: 5 mm perifissural nodule in the left mid lung is too small to characterize by PET-CT, image 57/4. Aortic atherosclerotic calcifications. ABDOMEN/PELVIS: There is no abnormal tracer uptake within the liver, pancreas, spleen, or adrenal glands. Multiple tracer avid retroperitoneal lymph nodes are identified. Index right retrocaval lymph node measures 1.1 cm within SUV max of 6.59, image 102/4. Bilateral tracer avid pelvic lymph nodes identified. Right common iliac lymph node measures 1.1 cm within SUV max 10.28. Left external iliac lymph node measures 1.4 cm within SUV max of 6.6, image 132/4. Dominant right pelvic sidewall lymph node measures 2.9 cm within SUV max of 12.79, image 131/4. Multiple tracer avid right inguinal lymph nodes are identified. Index right inguinal node measures 1.2 cm within SUV max of 3.73. Incidental CT findings: Aortic atherosclerosis. No ascites identified within the abdomen or pelvis. No suspicious  peritoneal nodule. SKELETON: No focal hypermetabolic activity to suggest skeletal metastasis. Incidental CT findings: Status post left hip arthroplasty. Asymmetric subcutaneous edema is identified involving the visualized portions of the right lower extremity. IMPRESSION: 1. Examination is  positive for tracer avid adenopathy within the retroperitoneum, bilateral pelvis and right inguinal region. Findings are compatible with nodal metastasis. Primary neoplasm is not apparent on the current exam. 2. No signs of solid organ metastasis within the abdomen or pelvis. 3. No signs of tracer avid disease above the diaphragm. 4. Asymmetric subcutaneous edema is identified involving the visualized portions of the right lower extremity. 5. 5 mm perifissural nodule in the left mid lung is too small to characterize by PET-CT. 6. Increased uptake within both lobes of thyroid gland, right greater than left. Correlation with thyroid function tests advised. 7.  Aortic Atherosclerosis (ICD10-I70.0). Electronically Signed   By: Signa Kell M.D.   On: 11/18/2022 08:31   XR Wrist Complete Right  Result Date: 11/16/2022 Right wrist 3 views: Minimally displaced comminuted distal radius fracture intra-articular.  No other fractures identified.  CT ABDOMINAL MASS BIOPSY  Result Date: 11/11/2022 INDICATION: Pelvic lymphadenopathy EXAM: CT-guided core needle biopsy of pelvic lymph node MEDICATIONS: None. ANESTHESIA/SEDATION: Moderate (conscious) sedation was employed during this procedure. A total of Versed 1.5 mg and Fentanyl 100 mcg was administered intravenously. Moderate Sedation Time: 17 minutes. The patient's level of consciousness and vital signs were monitored continuously by radiology nursing throughout the procedure under my direct supervision. FLUOROSCOPY TIME:  N/a COMPLICATIONS: None immediate. PROCEDURE: Informed written consent was obtained from the patient after a thorough discussion of the procedural risks, benefits  and alternatives. All questions were addressed. Maximal Sterile Barrier Technique was utilized including caps, mask, sterile gowns, sterile gloves, sterile drape, hand hygiene and skin antiseptic. A timeout was performed prior to the initiation of the procedure. The patient was placed prone on the exam table. Limited CT of the abdomen and pelvis was performed for planning purposes. This demonstrated large pelvic lymph node in the right hemipelvis. Skin entry site was marked, and the overlying skin was prepped and draped in the standard sterile fashion. Local analgesia was obtained with 1% lidocaine. Using intermittent CT fluoroscopy, a 17 gauge introducer needle was advanced towards the identified lesion. Subsequently, core needle biopsy was performed using an 18 gauge core biopsy device x7 total passes. Specimens were submitted in saline to pathology for further handling. Limited postprocedure imaging demonstrated no complicating feature. The patient tolerated the procedure well, and was transferred to recovery in stable condition. IMPRESSION: Successful CT-guided core needle biopsy of enlarged left pelvic lymph node. Electronically Signed   By: Olive Bass M.D.   On: 11/11/2022 13:36

## 2022-11-30 NOTE — Assessment & Plan Note (Signed)
She has chronic history of intermittent anemia I will order additional workup for anemia 

## 2022-12-01 ENCOUNTER — Other Ambulatory Visit: Payer: Self-pay | Admitting: Radiology

## 2022-12-01 ENCOUNTER — Telehealth: Payer: Self-pay | Admitting: Hematology and Oncology

## 2022-12-01 ENCOUNTER — Other Ambulatory Visit: Payer: Self-pay

## 2022-12-01 LAB — FERRITIN: Ferritin: 220 ng/mL (ref 11–307)

## 2022-12-01 LAB — TSH: TSH: 1.119 u[IU]/mL (ref 0.350–4.500)

## 2022-12-01 NOTE — H&P (Signed)
Referring Physician(s): Artis Delay  Supervising Physician: Ruel Favors  Patient Status:  [facility] OP  Chief Complaint:  "I'm getting a port a cath"  Subjective: Patient known to IR service from left pelvic lymph node biopsy on 11/11/2022.  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 She is scheduled today for Port-A-Cath placement to assist with treatment.   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   Top of Left Hand(Tx p Bx)   Past Surgical History:  Procedure Laterality Date   CARPAL TUNNEL RELEASE     BILAT     CATARACT EXTRACTION     BILAT    ECTOPIC PREGNANCY SURGERY     in the setting of prior BTL, lapartomy   EYE SURGERY     KNEE CARTILAGE SURGERY     RIGHT    (MENISCUS)   MOHS SURGERY     left nose/inner eye for basal cell   TONSILLECTOMY     TOTAL HIP ARTHROPLASTY Left 12/19/2015   TOTAL HIP ARTHROPLASTY Left 12/19/2015   Procedure: LEFT TOTAL HIP ARTHROPLASTY;  Surgeon: Valeria Batman, MD;  Location: [facility] OR;  Service: Orthopedics;  Laterality: Left;   TRIGGER FINGER RELEASE     TUBAL LIGATION        Allergies: Sulfa antibiotics  Medications: Prior to Admission medications   Medication Sig Start Date End Date Taking? Authorizing Provider  APIXABAN (ELIQUIS) VTE STARTER PACK (10MG  AND 5MG ) Take as directed on package: start with two-5mg  tablets twice daily for 7 days. On day 8, switch to one-5mg  tablet twice daily.  10/23/22   Redwine, Madison A, PA-C  Calcium Carb-Cholecalciferol (CALCIUM 1000 + D PO) Take by mouth.    [provider]  dexamethasone (DECADRON) 4 MG tablet Take 2 tabs at the night before and 2 tab the morning of chemotherapy, every 3 weeks, by mouth x 6 cycles 11/20/22   Artis Delay, MD  diclofenac sodium (VOLTAREN) 1 % GEL Apply 2-4 g topically 4 (four) times daily. 04/19/18   Jetty Peeks, PA-C  Glucosamine 500 MG CAPS 1 capsule Orally twice a day    [provider]  Glucosamine-Chondroit-Vit C-Mn (GLUCOSAMINE CHONDR 1500 COMPLX PO) Take by mouth.    [provider]  levothyroxine (SYNTHROID) 75 MCG tablet TAKE ONE (1) TABLET BY MOUTH EVERY DAY 10/12/22   Carlus Pavlov, MD  lidocaine-prilocaine (EMLA) cream Apply 1 Application topically as needed. 11/20/22   Artis Delay, MD  Multiple Vitamin (MULTIVITAMIN) tablet Take 1 tablet by mouth daily.    [provider]  ondansetron (ZOFRAN) 8 MG tablet Take 1 tablet (8 mg total) by mouth every 8 (eight) hours as needed for nausea. 11/20/22   Artis Delay, MD  prochlorperazine (COMPAZINE) 10 MG tablet Take 1 tablet (10 mg total) by mouth every 6 (six) hours as needed for nausea or vomiting. 11/20/22   Artis Delay, MD  Turmeric 400 MG CAPS Take by mouth. 09/11/21   [provider]  Vital Signs:    Code Status:   Physical Exam  Imaging: No results found.  Labs:  CBC: Recent Labs    10/23/22 2108 11/11/22 0806 11/30/22 1520  WBC 5.1 4.5 5.6  HGB 12.1 12.9 11.8*  HCT 36.9 40.3 35.0*  PLT 255 295 286    COAGS: Recent Labs    11/11/22 0806  INR 1.0    BMP: Recent Labs    10/23/22 2108 11/30/22 1520  NA 138 138  K 3.9 3.9  CL 102 102  CO2 28 31  GLUCOSE 97 106*  BUN 15 15  CALCIUM 9.7 9.5  CREATININE 0.84 0.80  GFRNONAA >60 >60    LIVER FUNCTION TESTS: Recent Labs    11/30/22 1520  BILITOT 0.5  AST 17  ALT 8  ALKPHOS 78  PROT 7.2  ALBUMIN 3.9     Assessment and Plan: 72 year old female with past medical history significant for arthritis, degenerative disc disease, hypothyroidism, skin cancer and 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 She is scheduled today for Port-A-Cath placement to assist with treatment.Risks and benefits of image guided port-a-catheter placement was discussed with the patient including, but not limited to bleeding, infection, pneumothorax, or fibrin sheath development and need for additional procedures.  All of the patient's questions were answered, patient is agreeable to proceed. Consent signed and in chart.    Electronically Signed: D. Jeananne Rama, PA-C 12/01/2022, 3:07 PM   I spent a total of 25 minutes at the the patient's bedside AND on the patient's hospital floor or unit, greater than 50% of which was counseling/coordinating care for port  a cath placement

## 2022-12-01 NOTE — Telephone Encounter (Signed)
I reviewed test results with the patient Her anemia panel is within normal range It appears that the patient indeed has signs of hematuria without signs of infection I recommend we keep her MRI pelvis to look for abnormalities in her pelvic structure including her bladder to find out the cause of her hematuria and she agreed

## 2022-12-02 ENCOUNTER — Other Ambulatory Visit: Payer: Self-pay

## 2022-12-02 ENCOUNTER — Ambulatory Visit (HOSPITAL_COMMUNITY)
Admission: RE | Admit: 2022-12-02 | Discharge: 2022-12-02 | Disposition: A | Discharge status: Home / Self Care | Payer: Medicare PPO | Source: Ambulatory Visit | Attending: Hematology and Oncology | Admitting: Hematology and Oncology

## 2022-12-02 ENCOUNTER — Encounter (HOSPITAL_COMMUNITY): Payer: Self-pay

## 2022-12-02 ENCOUNTER — Encounter: Payer: Self-pay | Admitting: Psychiatry

## 2022-12-02 ENCOUNTER — Encounter: Payer: Self-pay | Admitting: Hematology and Oncology

## 2022-12-02 DIAGNOSIS — C579 Malignant neoplasm of female genital organ, unspecified: Secondary | ICD-10-CM | POA: Insufficient documentation

## 2022-12-02 DIAGNOSIS — E039 Hypothyroidism, unspecified: Secondary | ICD-10-CM | POA: Diagnosis not present

## 2022-12-02 HISTORY — PX: IR IMAGING GUIDED PORT INSERTION: IMG5740

## 2022-12-02 MED ORDER — FENTANYL CITRATE (PF) 100 MCG/2ML IJ SOLN
INTRAMUSCULAR | Status: AC
  Filled 2022-12-02: qty 2

## 2022-12-02 MED ORDER — SODIUM CHLORIDE 0.9 % IV SOLN: INTRAVENOUS | Status: DC

## 2022-12-02 MED ORDER — MIDAZOLAM HCL 2 MG/2ML IJ SOLN
INTRAMUSCULAR | Status: AC
  Filled 2022-12-02: qty 2

## 2022-12-02 MED ORDER — HEPARIN SOD (PORK) LOCK FLUSH 100 UNIT/ML IV SOLN
INTRAVENOUS | Status: AC
  Filled 2022-12-02: qty 5

## 2022-12-02 MED ORDER — LIDOCAINE-EPINEPHRINE 1 %-1:100000 IJ SOLN
INTRAMUSCULAR | Status: AC
  Filled 2022-12-02: qty 1

## 2022-12-02 NOTE — Discharge Instructions (Addendum)
Implanted Port Insertion, Care After  The following information offers guidance on how to care for yourself after your procedure. Your health care provider may also give you more specific instructions. If you have problems or questions, contact your health care provider.  What can I expect after the procedure? After the procedure, it is common to have: Discomfort at the port insertion site. Bruising on the skin over the port. This should improve over 3-4 days.   Urgent needs - Interventional Radiology, clinic [phone] (mon-fri 8-5).   Wound - May remove dressing and shower in 24 to 48 hours.  Keep site clean and dry.  Replace with bandaid as needed.  Do not submerge in tub or water until site healing well. If closed with glue, glue will flake off on its own.   If ordered by your provider, may start Emla cream (or any other creams ointments or lotions) in 2 weeks or after incision is healed. Port is ready for use immediately.   After completion of treatment, your provider should have you set up for monthly port flushes.   Follow these instructions at home: Port care After your port is placed, you will get a manufacturer's information card. The card has information about your port. Keep this card with you at all times. Take care of the port as told by your health care provider. Ask your health care provider if you or a family member can get training for taking care of the port at home. A home health care nurse will be be available to help care for the port. Make sure to remember what type of port you have. Incision care     Follow instructions from your health care provider about how to take care of your port insertion site. Make sure you: Wash your hands with soap and water for at least 20 seconds before and after you change your bandage (dressing). If soap and water are not available, use hand sanitizer. Change your dressing as told by your health care provider. Leave stitches  (sutures), skin glue, or adhesive strips in place. These skin closures may need to stay in place for 2 weeks or longer. If adhesive strip edges start to loosen and curl up, you may trim the loose edges. Do not remove adhesive strips completely unless your health care provider tells you to do that. Check your port insertion site every day for signs of infection. Check for: Redness, swelling, or pain. Fluid or blood. Warmth. Pus or a bad smell. Activity Return to your normal activities as told by your health care provider. Ask your health care provider what activities are safe for you. You may have to avoid lifting. Ask your health care provider how much you can safely lift. General instructions Take over-the-counter and prescription medicines only as told by your health care provider. Do not take baths, swim, or use a hot tub until your health care provider approves. Ask your health care provider if you may take showers. You may only be allowed to take sponge baths. If you were given a sedative during the procedure, it can affect you for several hours. Do not drive or operate machinery until your health care provider says that it is safe. Wear a medical alert bracelet in case of an emergency. This will tell any health care providers that you have a port. Keep all follow-up visits. This is important. Contact a health care provider if: You cannot flush your port with saline as directed, or you cannot   draw blood from the port. You have a fever or chills. You have redness, swelling, or pain around your port insertion site. You have fluid or blood coming from your port insertion site. Your port insertion site feels warm to the touch. You have pus or a bad smell coming from the port insertion site. Get help right away if: You have chest pain or shortness of breath. You have bleeding from your port that you cannot control. These symptoms may be an emergency. Get help right away. Call 911. Do not  wait to see if the symptoms will go away. Do not drive yourself to the hospital. Summary Take care of the port as told by your health care provider. Keep the manufacturer's information card with you at all times. Change your dressing as told by your health care provider. Contact a health care provider if you have a fever or chills or if you have redness, swelling, or pain around your port insertion site. Keep all follow-up visits. This information is not intended to replace advice given to you by your health care provider. Make sure you discuss any questions you have with your health care provider. Document Revised: 12/17/2020 Document Reviewed: 12/17/2020 Elsevier Patient Education  2023 [employer] Inc[employer]    Moderate Conscious Sedation  Adult  Care After (English)  After the procedure, it is common to have: Sleepiness for a few hours. Impaired judgment for a few hours. Trouble with balance. Nausea or vomiting if you eat too soon. Follow these instructions at home: For the time period you were told by your health care provider:  Rest. Do not participate in activities where you could fall or become injured. Do not drive or use machinery. Do not drink alcohol. Do not take sleeping pills or medicines that cause drowsiness. Do not make important decisions or sign legal documents. Do not take care of children on your own. Eating and drinking Follow instructions from your health care provider about what you may eat and drink. Drink enough fluid to keep your urine pale yellow. If you vomit: Drink clear fluids slowly and in small amounts as you are able. Clear fluids include water, ice chips, low-calorie sports drinks, and fruit juice that has water added to it (diluted fruit juice). Eat light and bland foods in small amounts as you are able. These foods include bananas, applesauce, rice, lean meats, toast, and crackers. General instructions Take over-the-counter and prescription medicines  only as told by your health care provider. Have a responsible adult stay with you for the time you are told. Do not use any products that contain nicotine or tobacco. These products include cigarettes, chewing tobacco, and vaping devices, such as e-cigarettes. If you need help quitting, ask your health care provider. Return to your normal activities as told by your health care provider. Ask your health care provider what activities are safe for you. Your health care provider may give you more instructions. Make sure you know what you can and cannot do. Contact a health care provider if: You are still sleepy or having trouble with balance after 24 hours. You feel light-headed. You vomit every time you eat or drink. You get a rash. You have a fever. You have redness or swelling around the IV site. Get help right away if: You have trouble breathing. You start to feel confused at home. These symptoms may be an emergency. Get help right away. Call 911. Do not wait to see if the symptoms will go away. Do not   drive yourself to the hospital. This information is not intended to replace advice given to you by your health care provider. Make sure you discuss any questions you have with your health care provider. 

## 2022-12-02 NOTE — Procedures (Signed)
Interventional Radiology Procedure Note  Procedure: rt IJ power port    Complications: None  Estimated Blood Loss:  min  Findings: Tip SVCRA    Sharen Counter, MD

## 2022-12-03 MED FILL — Fosaprepitant Dimeglumine For IV Infusion 150 MG (Base Eq): INTRAVENOUS | Qty: 5 | Status: AC

## 2022-12-03 MED FILL — Dexamethasone Sodium Phosphate Inj 100 MG/10ML: INTRAMUSCULAR | Qty: 1 | Status: AC

## 2022-12-04 ENCOUNTER — Telehealth: Payer: Self-pay | Admitting: Orthopaedic Surgery

## 2022-12-04 ENCOUNTER — Other Ambulatory Visit: Payer: Self-pay

## 2022-12-04 ENCOUNTER — Inpatient Hospital Stay: Payer: Medicare PPO

## 2022-12-04 VITALS — BP 125/73 | HR 53 | Temp 98.3°F | Resp 18 | Ht 62.0 in | Wt 155.5 lb

## 2022-12-04 DIAGNOSIS — Z5111 Encounter for antineoplastic chemotherapy: Secondary | ICD-10-CM | POA: Diagnosis not present

## 2022-12-04 DIAGNOSIS — C55 Malignant neoplasm of uterus, part unspecified: Secondary | ICD-10-CM

## 2022-12-04 MED ORDER — FAMOTIDINE IN NACL 20-0.9 MG/50ML-% IV SOLN
20.0000 mg | Freq: Once | INTRAVENOUS | Status: AC
  Administered 2022-12-04: 20 mg via INTRAVENOUS
  Filled 2022-12-04: qty 50

## 2022-12-04 MED ORDER — SODIUM CHLORIDE 0.9 % IV SOLN
175.0000 mg/m2 | Freq: Once | INTRAVENOUS | Status: AC
  Administered 2022-12-04: 312 mg via INTRAVENOUS
  Filled 2022-12-04: qty 52

## 2022-12-04 MED ORDER — CETIRIZINE HCL 10 MG/ML IV SOLN
10.0000 mg | Freq: Once | INTRAVENOUS | Status: AC
  Administered 2022-12-04: 10 mg via INTRAVENOUS
  Filled 2022-12-04: qty 1

## 2022-12-04 MED ORDER — SODIUM CHLORIDE 0.9 % IV SOLN
150.0000 mg | Freq: Once | INTRAVENOUS | Status: AC
  Administered 2022-12-04: 150 mg via INTRAVENOUS
  Filled 2022-12-04: qty 150

## 2022-12-04 MED ORDER — SODIUM CHLORIDE 0.9 % IV SOLN
501.6000 mg | Freq: Once | INTRAVENOUS | Status: AC
  Administered 2022-12-04: 500 mg via INTRAVENOUS
  Filled 2022-12-04: qty 50

## 2022-12-04 MED ORDER — SODIUM CHLORIDE 0.9% FLUSH
10.0000 mL | INTRAVENOUS | Status: DC | PRN
  Administered 2022-12-04: 10 mL

## 2022-12-04 MED ORDER — SODIUM CHLORIDE 0.9 % IV SOLN: Freq: Once | INTRAVENOUS | Status: AC

## 2022-12-04 MED ORDER — SODIUM CHLORIDE 0.9 % IV SOLN
10.0000 mg | Freq: Once | INTRAVENOUS | Status: AC
  Administered 2022-12-04: 10 mg via INTRAVENOUS
  Filled 2022-12-04: qty 10

## 2022-12-04 MED ORDER — HEPARIN SOD (PORK) LOCK FLUSH 100 UNIT/ML IV SOLN
500.0000 [IU] | Freq: Once | INTRAVENOUS | Status: AC | PRN
  Administered 2022-12-04: 500 [IU]

## 2022-12-04 MED ORDER — PALONOSETRON HCL INJECTION 0.25 MG/5ML
0.2500 mg | Freq: Once | INTRAVENOUS | Status: AC
  Administered 2022-12-04: 0.25 mg via INTRAVENOUS
  Filled 2022-12-04: qty 5

## 2022-12-04 NOTE — Patient Instructions (Signed)
[facility]  Discharge Instructions: Thank you for choosing [facility] to provide your oncology and hematology care.   If you have a lab appointment with the Cancer Center, please go directly to the Cancer Center and check in at the registration area.   Wear comfortable clothing and clothing appropriate for easy access to any Portacath or PICC line.   We strive to give you quality time with your provider. You may need to reschedule your appointment if you arrive late (15 or more minutes).  Arriving late affects you and other patients whose appointments are after yours.  Also, if you miss three or more appointments without notifying the office, you may be dismissed from the clinic at the provider's discretion.      For prescription refill requests, have your pharmacy contact our office and allow 72 hours for refills to be completed.    Today you received the following chemotherapy and/or immunotherapy agents paclitaxel, carboplatin      To help prevent nausea and vomiting after your treatment, we encourage you to take your nausea medication as directed.  BELOW ARE SYMPTOMS THAT SHOULD BE REPORTED IMMEDIATELY: *FEVER GREATER THAN 100.4 F (38 C) OR HIGHER *CHILLS OR SWEATING *NAUSEA AND VOMITING THAT IS NOT CONTROLLED WITH YOUR NAUSEA MEDICATION *UNUSUAL SHORTNESS OF BREATH *UNUSUAL BRUISING OR BLEEDING *URINARY PROBLEMS (pain or burning when urinating, or frequent urination) *BOWEL PROBLEMS (unusual diarrhea, constipation, pain near the anus) TENDERNESS IN MOUTH AND THROAT WITH OR WITHOUT PRESENCE OF ULCERS (sore throat, sores in mouth, or a toothache) UNUSUAL RASH, SWELLING OR PAIN  UNUSUAL VAGINAL DISCHARGE OR ITCHING   Items with * indicate a potential emergency and should be followed up as soon as possible or go to the Emergency Department if any problems should occur.  Please show the CHEMOTHERAPY ALERT CARD or IMMUNOTHERAPY ALERT  CARD at check-in to the Emergency Department and triage nurse.  Should you have questions after your visit or need to cancel or reschedule your appointment, please contact [facility]  Dept: [phone]  and follow the prompts.  Office hours are 8:00 a.m. to 4:30 p.m. Monday - Friday. Please note that voicemails left after 4:00 p.m. may not be returned until the following business day.  We are closed weekends and major holidays. You have access to a nurse at all times for urgent questions. Please call the main number to the clinic Dept: [phone] and follow the prompts.   For any non-urgent questions, you may also contact your provider using MyChart. We now offer e-Visits for anyone 18 and older to request care online for non-urgent symptoms. For details visit [email or web address].   Also download the MyChart app! Go to the app store, search "MyChart", open the app, select [facility], and log in with your MyChart username and password. 

## 2022-12-04 NOTE — Telephone Encounter (Signed)
Patient was on wait list and got an early appt for 06/10 and was wondering if this was okay she is supposed to be getting her cast removed on next appt please advise

## 2022-12-04 NOTE — Telephone Encounter (Signed)
Called and lvm advising pt that this was ok

## 2022-12-07 ENCOUNTER — Encounter: Payer: Self-pay | Admitting: Hematology and Oncology

## 2022-12-07 ENCOUNTER — Other Ambulatory Visit (INDEPENDENT_AMBULATORY_CARE_PROVIDER_SITE_OTHER): Payer: Medicare PPO

## 2022-12-07 ENCOUNTER — Other Ambulatory Visit: Payer: Self-pay | Admitting: Oncology

## 2022-12-07 ENCOUNTER — Other Ambulatory Visit: Payer: Self-pay | Admitting: Hematology and Oncology

## 2022-12-07 ENCOUNTER — Ambulatory Visit: Payer: Medicare PPO | Admitting: Orthopaedic Surgery

## 2022-12-07 DIAGNOSIS — S62101D Fracture of unspecified carpal bone, right wrist, subsequent encounter for fracture with routine healing: Secondary | ICD-10-CM

## 2022-12-07 DIAGNOSIS — C55 Malignant neoplasm of uterus, part unspecified: Secondary | ICD-10-CM

## 2022-12-07 DIAGNOSIS — C579 Malignant neoplasm of female genital organ, unspecified: Secondary | ICD-10-CM

## 2022-12-07 NOTE — Progress Notes (Signed)
The patient is seen in follow-up for a right wrist radial styloid fracture.  She has been in a thumb spica cast.  She is an active 72 year old right-hand-dominant female.  She is doing well overall.  After removing the cast there is fortunately minimal swelling.  Anatomically her wrist appears straight.  She does have stiffness to be expected with the wrist with being in a cast for several weeks.  3 views of the right wrist show that the radial styloid fracture is healing and in good position.  Will transition her to a Velcro thumb spica wrist splint.  She can come out of this multiple times a day to work on range of motion of her wrist.  We will see her back in 4 weeks with a repeat 3 views of the right wrist and from there transition to outpatient therapy if needed.

## 2022-12-07 NOTE — Telephone Encounter (Signed)
-----   Message from Arville Care, RN sent at 12/04/2022  4:37 PM EDT ----- Regarding: Bertis Ruddy 1st Tx F/U call - Taxol, Irving Shows 1st Tx F/U call - Taxol, Carbo.  Tolerated infusion well.

## 2022-12-07 NOTE — Progress Notes (Signed)
DISCONTINUE ON PATHWAY REGIMEN - Uterine     A cycle is every 21 days:     Paclitaxel      Carboplatin   **Always confirm dose/schedule in your pharmacy ordering system**  REASON: Other Reason PRIOR TREATMENT: [id number]: Carboplatin AUC=6 + Paclitaxel 175 mg/m2 q21 Days x 6 Cycles Followed by Referral to Radiation TREATMENT RESPONSE: Unable to Evaluate  START ON PATHWAY REGIMEN - Uterine     Cycles 1 through 6: A cycle is every 21 days:     Pembrolizumab      Paclitaxel      Carboplatin    Cycles 7 and beyond: A cycle is every 42 days:     Pembrolizumab   **Always confirm dose/schedule in your pharmacy ordering system**  Patient Characteristics: Serous Carcinoma, Newly Diagnosed (Clinical Staging), Nonsurgical Candidate, Stage III or IV, 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

## 2022-12-07 NOTE — Progress Notes (Signed)
Gynecologic Oncology Multi-Disciplinary Disposition Conference Note  Date of the Conference: 12/07/2022  Patient Name: [name]  Referring Provider: Dr. Seymour Bars Primary GYN Oncologist: Dr. 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 Disposition is for neoadjuvant chemotherapy followed by repeat imaging and consideration for interval debulking surgery. Also referral to genetics and next generation sequencing.   This Multidisciplinary conference took place involving physicians from Gynecologic Oncology, Medical Oncology, Radiation Oncology, Pathology, Radiology along with the Gynecologic Oncology Nurse Practitioner and Gynecologic Oncology Nurse Navigator.  Comprehensive assessment of the patient's malignancy, staging, need for surgery, chemotherapy, radiation therapy, and need for further testing were reviewed. Supportive measures, both inpatient and following discharge were also discussed. The recommended plan of care is documented. Greater than 35 minutes were spent correlating and coordinating this patient's care.

## 2022-12-07 NOTE — Telephone Encounter (Signed)
Called pt to see how she was doing post chemo.  She had already talked with Dr Bertis Ruddy this am & told her she was having some bone pain which was tough last night & some better today.  She is adding immunotherapy to her regimen. She can take tylenol but if pain no better was told to take ibuprofen.  She states she is on eliquis & wondered of Dr Bertis Ruddy saw that.  She knows her next appts & how to reach [place] if needed.  Informed that I would send a message to verify.  Message routed to Dr.

## 2022-12-08 ENCOUNTER — Telehealth: Payer: Self-pay

## 2022-12-08 ENCOUNTER — Telehealth: Payer: Self-pay | Admitting: Family Medicine

## 2022-12-08 NOTE — Telephone Encounter (Signed)
Patient currently declined the [employer] request. Will follow up with doctor first, patient will then give a call back to schedule when they are ready.

## 2022-12-08 NOTE — Telephone Encounter (Signed)
Called to follow up on message received from chemo call follow up yesterday. Per Dr. Bertis Ruddy, she is aware that she is taking Eliquis and in small doses it okay to take Ibuprofen, it in not contraindicated. She verbalized understanding and appreciated the call.

## 2022-12-09 ENCOUNTER — Ambulatory Visit: Payer: Medicare PPO | Admitting: Orthopaedic Surgery

## 2022-12-09 ENCOUNTER — Ambulatory Visit (HOSPITAL_BASED_OUTPATIENT_CLINIC_OR_DEPARTMENT_OTHER): Payer: Medicare PPO

## 2022-12-14 ENCOUNTER — Ambulatory Visit: Payer: Medicare PPO | Admitting: Orthopaedic Surgery

## 2022-12-15 ENCOUNTER — Ambulatory Visit (HOSPITAL_COMMUNITY)
Admission: RE | Admit: 2022-12-15 | Discharge: 2022-12-15 | Disposition: A | Discharge status: Home / Self Care | Payer: Medicare PPO | Source: Ambulatory Visit | Attending: Psychiatry | Admitting: Psychiatry

## 2022-12-15 DIAGNOSIS — C801 Malignant (primary) neoplasm, unspecified: Secondary | ICD-10-CM | POA: Insufficient documentation

## 2022-12-15 MED ORDER — GADOBUTROL 1 MMOL/ML IV SOLN
7.0000 mL | Freq: Once | INTRAVENOUS | Status: AC | PRN
  Administered 2022-12-15: 7 mL via INTRAVENOUS

## 2022-12-16 ENCOUNTER — Encounter (HOSPITAL_COMMUNITY): Payer: Self-pay | Admitting: Psychiatry

## 2022-12-17 ENCOUNTER — Telehealth: Payer: Self-pay | Admitting: Oncology

## 2022-12-17 NOTE — Telephone Encounter (Signed)
Left a message with Genetic Counseling appointment on 01/07/23 at 10:00.

## 2022-12-21 ENCOUNTER — Ambulatory Visit: Payer: Medicare PPO | Admitting: Internal Medicine

## 2022-12-21 ENCOUNTER — Encounter: Payer: Self-pay | Admitting: Hematology and Oncology

## 2022-12-21 ENCOUNTER — Encounter: Payer: Self-pay | Admitting: Internal Medicine

## 2022-12-21 ENCOUNTER — Encounter: Payer: Self-pay | Admitting: Psychiatry

## 2022-12-21 VITALS — BP 120/68 | HR 68 | Ht 62.0 in | Wt 148.2 lb

## 2022-12-21 DIAGNOSIS — E042 Nontoxic multinodular goiter: Secondary | ICD-10-CM | POA: Diagnosis not present

## 2022-12-21 DIAGNOSIS — E038 Other specified hypothyroidism: Secondary | ICD-10-CM

## 2022-12-21 DIAGNOSIS — E063 Autoimmune thyroiditis: Secondary | ICD-10-CM | POA: Diagnosis not present

## 2022-12-21 MED ORDER — LEVOTHYROXINE SODIUM 75 MCG PO TABS: 75.0000 ug | ORAL_TABLET | Freq: Every day | ORAL | 3 refills | Status: DC

## 2022-12-21 NOTE — Patient Instructions (Addendum)
   Please continue Levothyroxine 75 mcg daily.  Take the thyroid hormone every day, with water, at least 30 minutes before breakfast, separated by at least 4 hours from: - acid reflux medications - calcium - iron - multivitamins  Please come back for a follow-up appointment in 1 year.  

## 2022-12-21 NOTE — Progress Notes (Signed)
Patient ID: [name], female   DOB: 05-23-1951, 72 y.o.   MRN: [id number]  HPI  BEDELIA Zavala is a 72 y.o.-year-old female, initially referred by Dr Audie Box for management of hypothyroidism, diagnosed as secondary to Hashimoto's thyroiditis, and also thyroid nodules, diagnosed 11/2021, returning for follow-up.  Last visit 1 year ago.  Interim history: At last visit she complained of hot flashes. 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 She started ChTx 3 weeks ago.  She will also start a biologic soon.  She may have surgery next. She had a wrist fracture since last visit.  This has healed but the right wrist is still swollen.  Reviewed history: Patient was diagnosed with hypothyroidism in 10/2014 and started on levothyroxine 50 mcg daily.   We increased the dose to 75 mcg daily in 11/2021.  She takes her levothyroxine: - in am (~4 am) - fasting - at least 1h from b'fast and coffee + creamer - + Ca >4h after LT4 - + Fe, + MVI at dinnertime - no PPIs - stopped Biotin   Reviewed her TFTs: Lab Results  Component Value Date   TSH 1.119 11/30/2022   TSH 2.69 01/19/2022   TSH 4.67 12/01/2021   TSH 3.35 12/02/2020   TSH 2.70 01/08/2020   TSH 2.32 11/28/2018   TSH 1.59 12/13/2017   TSH 1.68 12/04/2016   TSH 3.13 08/28/2016   TSH 1.97 10/18/2015   FREET4 0.81 01/19/2022   FREET4 0.78 12/01/2021   FREET4 0.80 12/02/2020   FREET4 1.02 01/08/2020   FREET4 0.88 11/28/2018   FREET4 0.90 12/13/2017   FREET4 0.87 12/04/2016   FREET4 0.95 08/28/2016   FREET4 1.01 10/18/2015   FREET4 1.05 04/15/2015    Patient's TPO antibodies were elevated, confirming Hashimoto's thyroiditis Component     Latest Ref Rng 04/15/2015  Thyroperoxidase Ab SerPl-aCnc     <9 IU/mL 151 (H)  Thyroglobulin Ab     <2 IU/mL <1   Thyroid nodule was palpated at last visit so we checked a  thyroid ultrasound: Thyroid U/S (12/17/2021): Parenchymal Echotexture: Moderately heterogenous  Isthmus: 3 mm  Right lobe: 4.5 x 2.1 x 1.4 cm  Left lobe: 3.9 x 1.2 x 1.2 cm _________________________________________________________   Nodule # 1:  Location: Isthmus; right  Maximum size: 1.3 cm; Other 2 dimensions: 1.1 x 0.6 cm  Composition: solid/almost completely solid (2)  Echogenicity: hypoechoic (2)  ACR TI-RADS total points: 4.  *Given size (>/= 1 - 1.4 cm) and appearance, a follow-up ultrasound in 1 year should Lisa considered based on TI-RADS criteria.  _________________________________________________________   Additional subcentimeter hypoechoic nodule in the left thyroid superiorly measures only 6 mm. This would not meet criteria for any biopsy or follow-up and is not fully detailed by TI rads criteria.   Moderate thyroid heterogeneity suggesting component of medical thyroid disease. Correlate with thyroid function tests. No hypervascularity. No regional adenopathy.   IMPRESSION: 1.3 cm right isthmus TR 4 nodule meets criteria for follow-up in 1 year.   Other findings as above.  Pt denies: - feeling nodules in neck - hoarseness - dysphagia - choking  No FH of thyroid cancer. No h/o radiation tx to head or neck. No herbal supplements. No Biotin use. No recent steroids use.   She has knee pain.  In 04/2021, she fell and hurt her elbow while playing tennis.  She had a  fracture and joint effusion.  This resolved. She was seeing Dr Corliss Skains (rheum).  She was investigated for possible autoimmune disease in the past, but she does not have a clear diagnosis.  She was playing tennis consistently and playing in tournaments, but now on hold.  She had THR in 11/2015.  ROS: + see HPI  I reviewed pt's medications, allergies, PMH, social hx, family hx, and changes were documented in the history of present illness. Otherwise, unchanged from my initial visit note.   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   Top of Left Hand(Tx p Bx)   Past Surgical History:  Procedure Laterality Date   CARPAL TUNNEL RELEASE     BILAT     CATARACT EXTRACTION     BILAT    ECTOPIC PREGNANCY SURGERY     in the setting of prior BTL, lapartomy   EYE SURGERY     IR IMAGING GUIDED PORT INSERTION  12/02/2022   KNEE CARTILAGE SURGERY     RIGHT    (MENISCUS)   MOHS SURGERY     left nose/inner eye for basal cell   TONSILLECTOMY     TOTAL HIP ARTHROPLASTY Left 12/19/2015   TOTAL HIP ARTHROPLASTY Left 12/19/2015   Procedure: LEFT TOTAL HIP ARTHROPLASTY;  Surgeon: Valeria Batman, MD;  Location: [facility] OR;  Service: Orthopedics;  Laterality: Left;   TRIGGER FINGER RELEASE     TUBAL LIGATION     Social History   Social History   Marital Status: Married    Spouse Name: N/A   Number of Children: 2   Occupational History    retired Programmer, systems, now Interior and spatial designer of preschool at [employer]   Social History Main Topics   Smoking status: Never Smoker    Smokeless tobacco: Not on file   Alcohol Use: 0.0 oz/week    0 Standard drinks or equivalent per week     Comment: Rare   Drug Use: No   Current Outpatient Medications on File Prior to Visit  Medication Sig Dispense Refill   APIXABAN (ELIQUIS) VTE STARTER PACK (10MG  AND 5MG ) Take as directed on package: start with two-5mg  tablets twice daily for 7 days. On day 8, switch to one-5mg  tablet twice daily. 1 each 0   Calcium Carb-Cholecalciferol (CALCIUM 1000 + D PO) Take by mouth.     dexamethasone (DECADRON) 4 MG tablet Take 2 tabs at the night before and 2 tab the morning of chemotherapy, every 3 weeks, by mouth x 6  cycles 24 tablet 6   diclofenac sodium (VOLTAREN) 1 % GEL Apply 2-4 g topically 4 (four) times daily. 3 Tube 3   Glucosamine 500 MG CAPS 1 capsule Orally twice a day     Glucosamine-Chondroit-Vit C-Mn (GLUCOSAMINE CHONDR 1500 COMPLX PO) Take by mouth.     levothyroxine (SYNTHROID) 75 MCG tablet TAKE ONE (1) TABLET BY MOUTH EVERY DAY 45 tablet 2   lidocaine-prilocaine (EMLA) cream Apply 1 Application topically as needed. 30 g 3   Multiple Vitamin (MULTIVITAMIN) tablet Take 1 tablet by mouth daily.     ondansetron (ZOFRAN) 8 MG tablet Take 1 tablet (8 mg  total) by mouth every 8 (eight) hours as needed for nausea. 30 tablet 3   prochlorperazine (COMPAZINE) 10 MG tablet Take 1 tablet (10 mg total) by mouth every 6 (six) hours as needed for nausea or vomiting. 30 tablet 0   Turmeric 400 MG CAPS Take by mouth.     No current facility-administered medications on file prior to visit.   Allergies  Allergen Reactions   Sulfa Antibiotics Swelling   Family History  Problem Relation Age of Onset   Uterine cancer Mother    Heart attack Mother    Hypertension Mother    Kidney cancer Father    Lung cancer Father    Diabetes Sister    Thyroid disease Neg Hx    Colon cancer Neg Hx    Breast cancer Neg Hx    Ovarian cancer Neg Hx    Endometrial cancer Neg Hx    Pancreatic cancer Neg Hx    Prostate cancer Neg Hx    PE: BP 120/68   Pulse 68   Ht 5\' 2"  (1.575 m)   Wt 148 lb 3.2 oz (67.2 kg)   SpO2 95%   BMI 27.11 kg/m    Wt Readings from Last 3 Encounters:  12/21/22 148 lb 3.2 oz (67.2 kg)  12/04/22 155 lb 8 oz (70.5 kg)  11/30/22 156 lb 6 oz (70.9 kg)   Constitutional: normal weight, in NAD Eyes: EOMI, no exophthalmos ENT: + ~1.5 cm right-sided thyroid nodule palpated, no cervical lymphadenopathy Cardiovascular: RRR, No MRG, enlarged R thigh  Respiratory: CTA B Musculoskeletal: no deformities Skin: no rashes, + very tanned Neurological: no tremor with outstretched  hands  ASSESSMENT: 1. Hypothyroidism due to Hashimoto's thyroiditis  2.  Right thyroid nodule  PLAN:  1. Patient with history of autoimmune hypothyroidism, on levothyroxine therapy - latest thyroid labs reviewed with pt. >> normal: Lab Results  Component Value Date   TSH 1.119 11/30/2022  - she continues on LT4 75 mcg daily, dose increased at last visit, as the TSH was closer to the upper limit of the target range - pt feels good on this dose. - we discussed about taking the thyroid hormone every day, with water, >30 minutes before breakfast, separated by >4 hours from acid reflux medications, calcium, iron, multivitamins. Pt. is taking it correctly. - will recheck the tests at next visit  2.  Right thyroid nodule -Detected on palpation -We checked a thyroid ultrasound at last visit and this showed a 1.3 right, solid, hypoechoic, isthmic nodule which qualified for 1 year follow-up.  She also had a left 6 mm nodule which did not require follow-up. -A PET scan showed that the whole thyroid is an increased uptake, consistent with her diagnosis of Hashimoto's thyroiditis, but the thyroid nodules were not necessarily FDG avid -No neck compression symptoms -We discussed about keeping an eye on the thyroid nodule, but I would not necessarily recommend an ultrasound this year, due to her intensity investigation for cancer.  She agrees and plan to check another thyroid ultrasound in a year  Carlus Pavlov, MD PhD [facility]

## 2022-12-22 ENCOUNTER — Encounter (HOSPITAL_COMMUNITY): Payer: Self-pay | Admitting: Psychiatry

## 2022-12-22 ENCOUNTER — Other Ambulatory Visit: Payer: Self-pay

## 2022-12-24 MED FILL — Fosaprepitant Dimeglumine For IV Infusion 150 MG (Base Eq): INTRAVENOUS | Qty: 5 | Status: AC

## 2022-12-24 MED FILL — Dexamethasone Sodium Phosphate Inj 100 MG/10ML: INTRAMUSCULAR | Qty: 1 | Status: AC

## 2022-12-25 ENCOUNTER — Inpatient Hospital Stay: Payer: Medicare PPO

## 2022-12-25 ENCOUNTER — Inpatient Hospital Stay: Payer: Medicare PPO | Admitting: Hematology and Oncology

## 2022-12-25 ENCOUNTER — Other Ambulatory Visit: Payer: Self-pay | Admitting: Hematology and Oncology

## 2022-12-25 ENCOUNTER — Other Ambulatory Visit (HOSPITAL_COMMUNITY): Payer: Self-pay

## 2022-12-25 ENCOUNTER — Encounter: Payer: Self-pay | Admitting: Hematology and Oncology

## 2022-12-25 VITALS — BP 109/72 | HR 66 | Temp 98.0°F | Resp 18 | Ht 62.0 in | Wt 150.8 lb

## 2022-12-25 VITALS — BP 113/73 | HR 56

## 2022-12-25 DIAGNOSIS — C55 Malignant neoplasm of uterus, part unspecified: Secondary | ICD-10-CM

## 2022-12-25 DIAGNOSIS — I82411 Acute embolism and thrombosis of right femoral vein: Secondary | ICD-10-CM | POA: Diagnosis not present

## 2022-12-25 DIAGNOSIS — Z5111 Encounter for antineoplastic chemotherapy: Secondary | ICD-10-CM | POA: Diagnosis not present

## 2022-12-25 LAB — CMP (CANCER CENTER ONLY)
ALT: 10 U/L (ref 0–44)
AST: 16 U/L (ref 15–41)
Albumin: 4 g/dL (ref 3.5–5.0)
Alkaline Phosphatase: 86 U/L (ref 38–126)
Anion gap: 7 (ref 5–15)
BUN: 19 mg/dL (ref 8–23)
CO2: 27 mmol/L (ref 22–32)
Calcium: 9.7 mg/dL (ref 8.9–10.3)
Chloride: 104 mmol/L (ref 98–111)
Creatinine: 0.72 mg/dL (ref 0.44–1.00)
GFR, Estimated: >60 mL/min (ref >60)
Glucose, Bld: 153 mg/dL — ABNORMAL HIGH (ref 70–99)
Potassium: 4 mmol/L (ref 3.5–5.1)
Sodium: 138 mmol/L (ref 135–145)
Total Bilirubin: 0.3 mg/dL (ref 0.3–1.2)
Total Protein: 7.5 g/dL (ref 6.5–8.1)

## 2022-12-25 LAB — CBC WITH DIFFERENTIAL (CANCER CENTER ONLY)
Abs Immature Granulocytes: 0.02 10*3/uL (ref 0.00–0.07)
Basophils Absolute: 0 10*3/uL (ref 0.0–0.1)
Basophils Relative: 0 %
Eosinophils Absolute: 0 10*3/uL (ref 0.0–0.5)
Eosinophils Relative: 0 %
HCT: 35.6 % — ABNORMAL LOW (ref 36.0–46.0)
Hemoglobin: 12 g/dL (ref 12.0–15.0)
Immature Granulocytes: 0 %
Lymphocytes Relative: 11 %
Lymphs Abs: 0.7 10*3/uL (ref 0.7–4.0)
MCH: 30.5 pg (ref 26.0–34.0)
MCHC: 33.7 g/dL (ref 30.0–36.0)
MCV: 90.6 fL (ref 80.0–100.0)
Monocytes Absolute: 0.1 10*3/uL (ref 0.1–1.0)
Monocytes Relative: 1 %
Neutro Abs: 5.1 10*3/uL (ref 1.7–7.7)
Neutrophils Relative %: 88 %
Platelet Count: 252 10*3/uL (ref 150–400)
RBC: 3.93 MIL/uL (ref 3.87–5.11)
RDW: 12.5 % (ref 11.5–15.5)
WBC Count: 5.9 10*3/uL (ref 4.0–10.5)
nRBC: 0 % (ref 0.0–0.2)

## 2022-12-25 MED ORDER — CETIRIZINE HCL 10 MG/ML IV SOLN
10.0000 mg | Freq: Once | INTRAVENOUS | Status: AC
  Administered 2022-12-25: 10 mg via INTRAVENOUS
  Filled 2022-12-25: qty 1

## 2022-12-25 MED ORDER — SODIUM CHLORIDE 0.9 % IV SOLN
200.0000 mg | Freq: Once | INTRAVENOUS | Status: AC
  Administered 2022-12-25: 200 mg via INTRAVENOUS
  Filled 2022-12-25: qty 200

## 2022-12-25 MED ORDER — PALONOSETRON HCL INJECTION 0.25 MG/5ML
0.2500 mg | Freq: Once | INTRAVENOUS | Status: AC
  Administered 2022-12-25: 0.25 mg via INTRAVENOUS
  Filled 2022-12-25: qty 5

## 2022-12-25 MED ORDER — FAMOTIDINE IN NACL 20-0.9 MG/50ML-% IV SOLN
20.0000 mg | Freq: Once | INTRAVENOUS | Status: AC
  Administered 2022-12-25: 20 mg via INTRAVENOUS
  Filled 2022-12-25: qty 50

## 2022-12-25 MED ORDER — DIPHENHYDRAMINE HCL 50 MG/ML IJ SOLN: 50.0000 mg | Freq: Once | INTRAMUSCULAR | Status: DC

## 2022-12-25 MED ORDER — SODIUM CHLORIDE 0.9% FLUSH
10.0000 mL | INTRAVENOUS | Status: DC | PRN
  Administered 2022-12-25: 10 mL

## 2022-12-25 MED ORDER — SODIUM CHLORIDE 0.9 % IV SOLN: Freq: Once | INTRAVENOUS | Status: AC

## 2022-12-25 MED ORDER — SODIUM CHLORIDE 0.9 % IV SOLN
10.0000 mg | Freq: Once | INTRAVENOUS | Status: AC
  Administered 2022-12-25: 10 mg via INTRAVENOUS
  Filled 2022-12-25: qty 10

## 2022-12-25 MED ORDER — OXYCODONE HCL 5 MG PO TABS
5.0000 mg | ORAL_TABLET | ORAL | 0 refills | Status: DC | PRN
  Filled 2022-12-25: qty 30, 5d supply, fill #0

## 2022-12-25 MED ORDER — SODIUM CHLORIDE 0.9 % IV SOLN
150.0000 mg | Freq: Once | INTRAVENOUS | Status: AC
  Administered 2022-12-25: 150 mg via INTRAVENOUS
  Filled 2022-12-25: qty 150

## 2022-12-25 MED ORDER — SODIUM CHLORIDE 0.9 % IV SOLN
175.0000 mg/m2 | Freq: Once | INTRAVENOUS | Status: AC
  Administered 2022-12-25: 300 mg via INTRAVENOUS
  Filled 2022-12-25: qty 50

## 2022-12-25 MED ORDER — SODIUM CHLORIDE 0.9 % IV SOLN
402.5000 mg | Freq: Once | INTRAVENOUS | Status: AC
  Administered 2022-12-25: 400 mg via INTRAVENOUS
  Filled 2022-12-25: qty 40

## 2022-12-25 MED ORDER — HEPARIN SOD (PORK) LOCK FLUSH 100 UNIT/ML IV SOLN
500.0000 [IU] | Freq: Once | INTRAVENOUS | Status: AC | PRN
  Administered 2022-12-25: 500 [IU]

## 2022-12-25 NOTE — Assessment & Plan Note (Signed)
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

## 2022-12-25 NOTE — Progress Notes (Signed)
Discontinue diphenhydramine from oncology treatment plan --> Add Quzyttir (cetirizine) 10 mg IVPush x 1 as premedication for oncology treatment plan.  T.O. Dr Edger House, PharmD

## 2022-12-25 NOTE — Patient Instructions (Signed)
[facility]  Discharge Instructions: Thank you for choosing [facility] to provide your oncology and hematology care.   If you have a lab appointment with the Cancer Center, please go directly to the Cancer Center and check in at the registration area.   Wear comfortable clothing and clothing appropriate for easy access to any Portacath or PICC line.   We strive to give you quality time with your provider. You may need to reschedule your appointment if you arrive late (15 or more minutes).  Arriving late affects you and other patients whose appointments are after yours.  Also, if you miss three or more appointments without notifying the office, you may be dismissed from the clinic at the provider's discretion.      For prescription refill requests, have your pharmacy contact our office and allow 72 hours for refills to be completed.    Today you received the following chemotherapy and/or immunotherapy agents: Pembrolizumab, Paclitaxel, Carboplatin      To help prevent nausea and vomiting after your treatment, we encourage you to take your nausea medication as directed.  BELOW ARE SYMPTOMS THAT SHOULD BE REPORTED IMMEDIATELY: *FEVER GREATER THAN 100.4 F (38 C) OR HIGHER *CHILLS OR SWEATING *NAUSEA AND VOMITING THAT IS NOT CONTROLLED WITH YOUR NAUSEA MEDICATION *UNUSUAL SHORTNESS OF BREATH *UNUSUAL BRUISING OR BLEEDING *URINARY PROBLEMS (pain or burning when urinating, or frequent urination) *BOWEL PROBLEMS (unusual diarrhea, constipation, pain near the anus) TENDERNESS IN MOUTH AND THROAT WITH OR WITHOUT PRESENCE OF ULCERS (sore throat, sores in mouth, or a toothache) UNUSUAL RASH, SWELLING OR PAIN  UNUSUAL VAGINAL DISCHARGE OR ITCHING   Items with * indicate a potential emergency and should be followed up as soon as possible or go to the Emergency Department if any problems should occur.  Please show the CHEMOTHERAPY ALERT CARD or  IMMUNOTHERAPY ALERT CARD at check-in to the Emergency Department and triage nurse.  Should you have questions after your visit or need to cancel or reschedule your appointment, please contact [facility]  Dept: [phone]  and follow the prompts.  Office hours are 8:00 a.m. to 4:30 p.m. Monday - Friday. Please note that voicemails left after 4:00 p.m. may not be returned until the following business day.  We are closed weekends and major holidays. You have access to a nurse at all times for urgent questions. Please call the main number to the clinic Dept: [phone] and follow the prompts.   For any non-urgent questions, you may also contact your provider using MyChart. We now offer e-Visits for anyone 18 and older to request care online for non-urgent symptoms. For details visit [email or web address].   Also download the MyChart app! Go to the app store, search "MyChart", open the app, select , and log in with your MyChart username and password.  Pembrolizumab Injection What is this medication? PEMBROLIZUMAB (PEM broe LIZ ue mab) treats some types of cancer. It works by helping your immune system slow or stop the spread of cancer cells. It is a monoclonal antibody. This medicine may be used for other purposes; ask your health care provider or pharmacist if you have questions. COMMON BRAND NAME(S): Keytruda What should I tell my care team before I take this medication? They need to know if you have any of these conditions: Allogeneic stem cell transplant (uses someone else's stem cells) Autoimmune diseases, such as Crohn disease, ulcerative colitis, lupus History of chest   radiation Nervous system problems, such as Guillain-Barre syndrome, myasthenia gravis Organ transplant An unusual or allergic reaction to pembrolizumab, other medications, foods, dyes, or preservatives Pregnant or trying to get pregnant Breast-feeding How should I use  this medication? This medication is injected into a vein. It is given by your care team in a hospital or clinic setting. A special MedGuide will be given to you before each treatment. Be sure to read this information carefully each time. Talk to your care team about the use of this medication in children. While it may be prescribed for children as young as 6 months for selected conditions, precautions do apply. Overdosage: If you think you have taken too much of this medicine contact a poison control center or emergency room at once. NOTE: This medicine is only for you. Do not share this medicine with others. What if I miss a dose? Keep appointments for follow-up doses. It is important not to miss your dose. Call your care team if you are unable to keep an appointment. What may interact with this medication? Interactions have not been studied. This list may not describe all possible interactions. Give your health care provider a list of all the medicines, herbs, non-prescription drugs, or dietary supplements you use. Also tell them if you smoke, drink alcohol, or use illegal drugs. Some items may interact with your medicine. What should I watch for while using this medication? Your condition will be monitored carefully while you are receiving this medication. You may need blood work while taking this medication. This medication may cause serious skin reactions. They can happen weeks to months after starting the medication. Contact your care team right away if you notice fevers or flu-like symptoms with a rash. The rash may be red or purple and then turn into blisters or peeling of the skin. You may also notice a red rash with swelling of the face, lips, or lymph nodes in your neck or under your arms. Tell your care team right away if you have any change in your eyesight. Talk to your care team if you may be pregnant. Serious birth defects can occur if you take this medication during pregnancy and for  4 months after the last dose. You will need a negative pregnancy test before starting this medication. Contraception is recommended while taking this medication and for 4 months after the last dose. Your care team can help you find the option that works for you. Do not breastfeed while taking this medication and for 4 months after the last dose. What side effects may I notice from receiving this medication? Side effects that you should report to your care team as soon as possible: Allergic reactions--skin rash, itching, hives, swelling of the face, lips, tongue, or throat Dry cough, shortness of breath or trouble breathing Eye pain, redness, irritation, or discharge with blurry or decreased vision Heart muscle inflammation--unusual weakness or fatigue, shortness of breath, chest pain, fast or irregular heartbeat, dizziness, swelling of the ankles, feet, or hands Hormone gland problems--headache, sensitivity to light, unusual weakness or fatigue, dizziness, fast or irregular heartbeat, increased sensitivity to cold or heat, excessive sweating, constipation, hair loss, increased thirst or amount of urine, tremors or shaking, irritability Infusion reactions--chest pain, shortness of breath or trouble breathing, feeling faint or lightheaded Kidney injury (glomerulonephritis)--decrease in the amount of urine, red or dark brown urine, foamy or bubbly urine, swelling of the ankles, hands, or feet Liver injury--right upper belly pain, loss of appetite, nausea, light-colored stool, dark   yellow or brown urine, yellowing skin or eyes, unusual weakness or fatigue Pain, tingling, or numbness in the hands or feet, muscle weakness, change in vision, confusion or trouble speaking, loss of balance or coordination, trouble walking, seizures Rash, fever, and swollen lymph nodes Redness, blistering, peeling, or loosening of the skin, including inside the mouth Sudden or severe stomach pain, bloody diarrhea, fever,  nausea, vomiting Side effects that usually do not require medical attention (report to your care team if they continue or are bothersome): Bone, joint, or muscle pain Diarrhea Fatigue Loss of appetite Nausea Skin rash This list may not describe all possible side effects. Call your doctor for medical advice about side effects. You may report side effects to FDA at 1-800-FDA-1088. Where should I keep my medication? This medication is given in a hospital or clinic. It will not be stored at home. NOTE: This sheet is a summary. It may not cover all possible information. If you have questions about this medicine, talk to your doctor, pharmacist, or health care provider.  2024 Elsevier/Gold Standard (2021-10-28 00:00:00)

## 2022-12-25 NOTE — Assessment & Plan Note (Signed)
She has no further bleeding recently She will continue anticoagulation therapy

## 2022-12-25 NOTE — Research (Signed)
[id number]: A RANDOMIZED TRIAL ADDRESSING CANCER-RELATED FINANCIAL HARDSHIP THROUGH DELIVERY OF A PROACTIVE FINANCIAL NAVIGATION INTERVENTION (CREDIT)  Met with the patient to discuss the [id number] study. The patient was given the consent form and brochure, along with a business card with my contact if the patient has questions before our scheduled correspondence. I will call the patient next week, [id number] to confirm study participation.  Felecia Jan, [facility] 12/25/2022 4:46 PM

## 2022-12-25 NOTE — Progress Notes (Signed)
[facility] OFFICE PROGRESS NOTE  Patient Care Team: [name], MD as PCP - General (Family Medicine)  ASSESSMENT & PLAN:  Uterine cancer [facility]) I have reviewed all available results so far I gave her copies of molecular testing that indicated benefit to receive immunotherapy We discussed the role of genetic testing Clinically, she appears to be responding well to treatment and tolerated side effects of treatment so far Overall presentation is most consistent with lower uterine cancer Her bleeding has ceased  Plan to repeat imaging study after 3 cycles of chemotherapy  Acute deep vein thrombosis (DVT) of right lower extremity (HCC) She has no further bleeding recently She will continue anticoagulation therapy  No orders of the defined types were placed in this encounter.   All questions were answered. The patient knows to call the clinic with any problems, questions or concerns. The total time spent in the appointment was 40 minutes encounter with patients including review of chart and various tests results, discussions about plan of care and coordination of care plan   Artis Delay, MD 12/25/2022 1:30 PM  INTERVAL HISTORY: Please see below for problem oriented charting. she returns for treatment follow-up She is seen prior to cycle 2 of treatment I gave her copies of her molecular tests and discussed implication of test results I also reviewed her recent imaging study and discussed her clinical findings Overall, she tolerated recent treatment well No peripheral neuropathy Her bowel habits are stable She is eating well and maintaining adequate nutrition and hydration She continues to exercise as much as she can tolerate She continues to have intermittent aches and swelling in her legs Most recently, she passed a large clot from her vaginal area She has no further bleeding over the past week  REVIEW OF SYSTEMS:   Constitutional: Denies fevers, chills or  abnormal weight loss Eyes: Denies blurriness of vision Ears, nose, mouth, throat, and face: Denies mucositis or sore throat Respiratory: Denies cough, dyspnea or wheezes Cardiovascular: Denies palpitation, chest discomfort or lower extremity swelling Gastrointestinal:  Denies nausea, heartburn or change in bowel habits Skin: Denies abnormal skin rashes Lymphatics: Denies new lymphadenopathy or easy bruising Neurological:Denies numbness, tingling or new weaknesses Behavioral/Psych: Mood is stable, no new changes  All other systems were reviewed with the patient and are negative.  I have reviewed the past medical history, past surgical history, social history and family history with the patient and they are unchanged from previous note.  ALLERGIES:  is allergic to sulfa antibiotics.  MEDICATIONS:  Current Outpatient Medications  Medication Sig Dispense Refill   oxyCODONE (OXY IR/ROXICODONE) 5 MG immediate release tablet Take 1 tablet (5 mg total) by mouth every 4 (four) hours as needed for severe pain. 30 tablet 0   Calcium Carb-Cholecalciferol (CALCIUM 1000 + D PO) Take by mouth. (Patient not taking: Reported on 12/21/2022)     calcium carbonate (TUMS) 500 MG chewable tablet Chew 1 tablet by mouth daily.     dexamethasone (DECADRON) 4 MG tablet Take 2 tabs at the night before and 2 tab the morning of chemotherapy, every 3 weeks, by mouth x 6 cycles 24 tablet 6   diclofenac sodium (VOLTAREN) 1 % GEL Apply 2-4 g topically 4 (four) times daily. 3 Tube 3   ELIQUIS 5 MG TABS tablet Take 5 mg by mouth 2 (two) times daily.     Glucosamine 500 MG CAPS 1 capsule Orally twice a day     Glucosamine-Chondroit-Vit C-Mn (GLUCOSAMINE CHONDR 1500 COMPLX  PO) Take by mouth.     levothyroxine (SYNTHROID) 75 MCG tablet Take 1 tablet (75 mcg total) by mouth daily. 90 tablet 3   lidocaine-prilocaine (EMLA) cream Apply 1 Application topically as needed. 30 g 3   Multiple Vitamin (MULTIVITAMIN) tablet Take 1  tablet by mouth daily.     ondansetron (ZOFRAN) 8 MG tablet Take 1 tablet (8 mg total) by mouth every 8 (eight) hours as needed for nausea. 30 tablet 3   prochlorperazine (COMPAZINE) 10 MG tablet Take 1 tablet (10 mg total) by mouth every 6 (six) hours as needed for nausea or vomiting. 30 tablet 0   VITAMIN D, ERGOCALCIFEROL, PO Take by mouth.     No current facility-administered medications for this visit.   Facility-Administered Medications Ordered in Other Visits  Medication Dose Route Frequency Provider Last Rate Last Admin   CARBOplatin (PARAPLATIN) 400 mg in sodium chloride 0.9 % 250 mL chemo infusion  400 mg Intravenous Once Bertis Ruddy, Taelar Gronewold, MD       heparin lock flush 100 unit/mL  500 Units Intracatheter Once PRN Bertis Ruddy, Nataki Mccrumb, MD       PACLitaxel (TAXOL) 300 mg in sodium chloride 0.9 % 250 mL chemo infusion (> 80mg /m2)  175 mg/m2 (Treatment Plan Recorded) Intravenous Once Bertis Ruddy, Mayzee Reichenbach, MD 100 mL/hr at 12/25/22 1140 300 mg at 12/25/22 1140   sodium chloride flush (NS) 0.9 % injection 10 mL  10 mL Intracatheter PRN Artis Delay, MD        SUMMARY OF ONCOLOGIC HISTORY: Oncology History Overview Note  Er neg, MSI stable, PD-L1 80%   Uterine cancer (HCC)  10/23/2022 Imaging   1. 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 2. Multiple prominent palpable lymph nodes in the right groin measuring up to 2.1 cm, nonspecific. 3. Small right popliteal fossa Baker's cyst.   10/25/2022 Imaging   Ct imaging 1. Pelvic lymphadenopathy with the most prominent lymph node along the right iliac chain measuring 5.6 x 2.8 cm. Metastatic lymphadenopathy or lymphoproliferative disease cannot be excluded.  2. 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 3. Prominent right inguinal lymph nodes. 4. 8 mm sclerotic bone  lesion within the right ischium, indeterminate. 5. Aortic atherosclerosis.   Aortic Atherosclerosis (ICD10-I70.0).       11/11/2022 Pathology Results   SURGICAL PATHOLOGY CASE: [phone] PATIENT: [name] Surgical Pathology Report  Specimen Submitted: A. Lymph node, right pelvic  Clinical History: Pelvic lymphadenopathy  DIAGNOSIS: A. LYMPH NODE, RIGHT PELVIC; CT-GUIDED CORE NEEDLE BIOPSY: - METASTATIC POORLY DIFFERENTIATED CARCINOMA, SEE COMMENT.  Comment: Immunohistochemical studies show tumor cells to be positive for CK7, MOC-31, GATA-3, CK5/6, and calretinin (subset). P16 is strongly and diffusely positive. P53 demonstrates an absence of staining within tumor cells, indicative of a null staining pattern. PAX-8, WT-1, D240, TTF-1, cdx-2, and p40 are negative. The pattern of immunohistochemical staining is non-specific. Based on the morphologic features and pattern of immunohistochemical staining the differential diagnosis includes metastatic urothelial carcinoma, metastatic breast carcinoma, and metastatic serous carcinoma of gyn origin. Correlation with radiographic findings is required.  There is sufficient tissue present for ancillary molecular testing.  IHC slides were prepared by [facility], [place], [place]. All controls stained appropriately.   11/16/2022 PET scan   1. Examination is positive for tracer avid adenopathy within the retroperitoneum, bilateral pelvis  and right inguinal region. Findings are compatible with nodal metastasis. Primary neoplasm is not apparent on the current exam.  2. No signs of solid organ metastasis within the abdomen or pelvis. 3. No signs of tracer avid disease above the diaphragm. 4. Asymmetric subcutaneous edema is identified involving the visualized portions of the right lower extremity. 5. 5 mm perifissural nodule in the left mid lung is too small to characterize by PET-CT. 6. Increased uptake  within both lobes of thyroid gland, right greater than left. Correlation with thyroid function tests advised. 7.  Aortic Atherosclerosis (ICD10-I70.0).   11/19/2022 Initial Diagnosis   Gynecologic malignancy (HCC)   11/20/2022 Cancer Staging   Staging form: Corpus Uteri - Carcinoma and Carcinosarcoma, AJCC 8th Edition - Clinical stage from 11/20/2022: FIGO Stage IVB (cTX, cN2a, pM1) - Signed by Artis Delay, MD on 11/20/2022 Stage prefix: Initial diagnosis   12/02/2022 Procedure   Ultrasound and fluoroscopically guided right internal jugular single lumen power port catheter insertion. Tip in the SVC/RA junction. Catheter ready for use.   12/04/2022 - 12/04/2022 Chemotherapy   Patient is on Treatment Plan : UTERINE Carboplatin AUC 6 + Paclitaxel q21d     12/04/2022 -  Chemotherapy   Patient is on Treatment Plan : UTERINE Pembrolizumab (200), Paclitaxel (175), Carboplatin (5) q21d x 6 cycles / Pembrolizumab (400) q42d     12/15/2022 Imaging   1. No adnexal mass. No uterine mass lesion evident and there is no substantial thickening of the endometrium by MRI. Low signal intensity fibrous stroma of the cervix appears preserved although high signal intensity of the cervical mucosa is somewhat prominent. This could be correlated with Pap smear as clinically warranted. 2. Stable right pelvic sidewall and groin lymphadenopathy. 3. Small Bartholin's cysts bilaterally.     PHYSICAL EXAMINATION: ECOG PERFORMANCE STATUS: 0 - Asymptomatic  Vitals:   12/25/22 0847  BP: 109/72  Pulse: 66  Resp: 18  Temp: 98 F (36.7 C)  SpO2: 100%   Filed Weights   12/25/22 0847  Weight: 150 lb 12.8 oz (68.4 kg)    GENERAL:alert, no distress and comfortable NEURO: alert & oriented x 3 with fluent speech, no focal motor/sensory deficits  LABORATORY DATA:  I have reviewed the data as listed    Component Value Date/Time   NA 138 12/25/2022 0818   K 4.0 12/25/2022 0818   CL 104 12/25/2022 0818   CO2 27  12/25/2022 0818   GLUCOSE 153 (H) 12/25/2022 0818   BUN 19 12/25/2022 0818   CREATININE 0.72 12/25/2022 0818   CREATININE 0.83 12/14/2016 1247   CALCIUM 9.7 12/25/2022 0818   PROT 7.5 12/25/2022 0818   ALBUMIN 4.0 12/25/2022 0818   AST 16 12/25/2022 0818   ALT 10 12/25/2022 0818   ALKPHOS 86 12/25/2022 0818   BILITOT 0.3 12/25/2022 0818   GFRNONAA >60 12/25/2022 0818   GFRNONAA 81 11/28/2015 1516   GFRAA >60 12/21/2015 0432   GFRAA >89 11/28/2015 1516    No results found for: "SPEP", "UPEP"  Lab Results  Component Value Date   WBC 5.9 12/25/2022   NEUTROABS 5.1 12/25/2022   HGB 12.0 12/25/2022   HCT 35.6 (L) 12/25/2022   MCV 90.6 12/25/2022   PLT 252 12/25/2022      Chemistry      Component Value Date/Time   NA 138 12/25/2022 0818   K 4.0 12/25/2022 0818   CL 104 12/25/2022 0818   CO2 27 12/25/2022 0818   BUN 19 12/25/2022 0818  CREATININE 0.72 12/25/2022 0818   CREATININE 0.83 12/14/2016 1247      Component Value Date/Time   CALCIUM 9.7 12/25/2022 0818   ALKPHOS 86 12/25/2022 0818   AST 16 12/25/2022 0818   ALT 10 12/25/2022 0818   BILITOT 0.3 12/25/2022 0818       RADIOGRAPHIC STUDIES: I have personally reviewed the radiological images as listed and agreed with the findings in the report. 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 EXAM: MRI PELVIS WITHOUT AND WITH CONTRAST TECHNIQUE: Multiplanar multisequence MR imaging of the pelvis was performed both before and after administration of intravenous contrast. CONTRAST:  7mL GADAVIST GADOBUTROL 1 MMOL/ML IV SOLN COMPARISON:  PET-CT 11/16/2022.  Abdomen and pelvis CT 10/25/2022. FINDINGS: Urinary Tract: Bladder is partially obscured by susceptibility artifact from left hip hardware. No evidence for urethral diverticulum. Bowel:  Unremarkable visualized pelvic bowel loops. Vascular/Lymphatic: No pathologically enlarged lymph  nodes. No significant vascular abnormality seen. Right pelvic sidewall lymph node seen on CT of 10/25/2022 at 5.6 x 2.8 cm is 5.7 x 2.9 cm on MRI today (postcontrast T1 image 28/19). Upper normal to mildly enlarged right groin lymph nodes again noted including 11 mm short axis right groin node on 52/19. left pelvic sidewall partially obscured by susceptibility artifact. Reproductive: Uterus unremarkable without evidence for mass lesion. No appreciable thickening of the endometrium. Low signal intensity fibrous stroma of the cervix appears preserved. Mucosal signal within the cervical canal is prominent. Right ovary not discretely visualized. Left ovary likely visible along the left pelvic sidewall measuring 1.9 x 1.3 x 1.7 cm (axial T2 image 11 of series 5 and sagittal T2 image 22 of series 28). No adnexal mass.Small Bartholin's cyst noted bilaterally. Other:  No substantial free fluid. Musculoskeletal: No focal suspicious marrow enhancement within the visualized bony anatomy. Left hip region obscured by susceptibility artifact. IMPRESSION: 1. No adnexal mass. No uterine mass lesion evident and there is no substantial thickening of the endometrium by MRI. Low signal intensity fibrous stroma of the cervix appears preserved although high signal intensity of the cervical mucosa is somewhat prominent. This could be correlated with Pap smear as clinically warranted. 2. Stable right pelvic sidewall and groin lymphadenopathy. 3. Small Bartholin's cysts bilaterally. Electronically Signed   By: Kennith Center M.D.   On: 12/20/2022 12:57   XR Wrist Complete Right  Result Date: 12/07/2022 3 views of the right wrist show a healing radial styloid fracture.  IR IMAGING GUIDED PORT INSERTION  Result Date: 12/02/2022 CLINICAL DATA:  GYNECOLOGIC MALIGNANCY, ACCESS FOR CHEMOTHERAPY EXAM: RIGHT INTERNAL JUGULAR SINGLE LUMEN POWER PORT CATHETER INSERTION Date:  12/02/2022 12/02/2022 3:20 pm Radiologist:  M. Ruel Favors, MD Guidance:   Ultrasound and fluoroscopic MEDICATIONS: 1% lidocaine local with epinephrine ANESTHESIA/SEDATION: Versed 2.0 mg IV; Fentanyl 100 mcg IV; Moderate Sedation Time:  28 minutes The patient was continuously monitored during the procedure by the interventional radiology nurse under my direct supervision. FLUOROSCOPY: One minutes, 36 seconds (3 mGy) COMPLICATIONS: None immediate. CONTRAST:  None. PROCEDURE: Informed consent was obtained from the patient following explanation of the procedure, risks, benefits and alternatives. The patient understands, agrees and consents for the procedure. All questions were addressed. A time out was performed. Maximal barrier sterile technique utilized including caps, mask, sterile gowns, sterile gloves, large sterile drape, hand hygiene, and 2% chlorhexidine scrub. Under sterile conditions and local anesthesia, right internal jugular micropuncture venous access was performed. Access was  performed with ultrasound. Images were obtained for documentation of the patent right internal jugular vein. A guide wire was inserted followed by a transitional dilator. This allowed insertion of a guide wire and catheter into the IVC. Measurements were obtained from the SVC / RA junction back to the right IJ venotomy site. In the right infraclavicular chest, a subcutaneous pocket was created over the second anterior rib. This was done under sterile conditions and local anesthesia. 1% lidocaine with epinephrine was utilized for this. A 2.5 cm incision was made in the skin. Blunt dissection was performed to create a subcutaneous pocket over the right pectoralis major muscle. The pocket was flushed with saline vigorously. There was adequate hemostasis. The port catheter was assembled and checked for leakage. The port catheter was secured in the pocket with two retention sutures. The tubing was tunneled subcutaneously to the right venotomy site and inserted into the SVC/RA junction through a valved  peel-away sheath. Position was confirmed with fluoroscopy. Images were obtained for documentation. The patient tolerated the procedure well. No immediate complications. Incisions were closed in a two layer fashion with 4 - 0 Vicryl suture. Dermabond was applied to the skin. The port catheter was accessed, blood was aspirated followed by saline and heparin flushes. Needle was removed. A dry sterile dressing was applied. IMPRESSION: Ultrasound and fluoroscopically guided right internal jugular single lumen power port catheter insertion. Tip in the SVC/RA junction. Catheter ready for use. Electronically Signed   By: Judie Petit.  Shick M.D.   On: 12/02/2022 15:26

## 2022-12-28 NOTE — Addendum Note (Signed)
Encounter addended by: Edward Qualia on: 12/28/2022 10:35 AM  Actions taken: Imaging Exam ended

## 2022-12-28 NOTE — Addendum Note (Signed)
Encounter addended by: Edward Qualia on: 12/28/2022 12:08 PM  Actions taken: Imaging Exam ended

## 2023-01-05 ENCOUNTER — Telehealth: Payer: Self-pay

## 2023-01-05 DIAGNOSIS — C55 Malignant neoplasm of uterus, part unspecified: Secondary | ICD-10-CM

## 2023-01-05 NOTE — Telephone Encounter (Signed)
[id number]: A RANDOMIZED TRIAL ADDRESSING CANCER-RELATED FINANCIAL HARDSHIP THROUGH DELIVERY OF A PROACTIVE FINANCIAL NAVIGATION INTERVENTION (CREDIT)   DCP-001: Use of a Clinical Trial Screening Tool to Address Cancer Health Disparities in the [employer] Community Oncology Research Program (NCORP)  Called the patient to confirm interest in participating the [id number] study. The patient stated they would not be interested and will not be participating. The patient also denied DCP at this time. The patient will no longer be contacted about these two studies.    Matan Steen, [facility] 01/05/2023 1:19 PM

## 2023-01-07 ENCOUNTER — Encounter: Payer: Medicare PPO | Admitting: Genetic Counselor

## 2023-01-13 ENCOUNTER — Encounter: Payer: Self-pay | Admitting: Orthopaedic Surgery

## 2023-01-13 ENCOUNTER — Ambulatory Visit: Payer: Medicare PPO | Admitting: Orthopaedic Surgery

## 2023-01-13 ENCOUNTER — Other Ambulatory Visit (INDEPENDENT_AMBULATORY_CARE_PROVIDER_SITE_OTHER): Payer: Medicare PPO

## 2023-01-13 DIAGNOSIS — S62101D Fracture of unspecified carpal bone, right wrist, subsequent encounter for fracture with routine healing: Secondary | ICD-10-CM | POA: Diagnosis not present

## 2023-01-13 NOTE — Progress Notes (Signed)
Office Visit Note   Patient: [name]           Date of Birth: [birth year]           MRN: [id number] Visit Date: 01/13/2023              Requested by: Lisa Carson, MD [id number] [place],  [place] [address] PCP: Lisa Carson, MD   Assessment & Plan: Visit Diagnoses:  1. Closed fracture of right wrist with routine healing, subsequent encounter     Plan: She will follow-up with [place] as needed.  She will continue work on range of motion of the right wrist.  Questions were encouraged and answered at length today.  Follow-Up Instructions: Return if symptoms worsen or fail to improve.   Orders:  Orders Placed This Encounter  Procedures   XR Wrist Complete Right   No orders of the defined types were placed in this encounter.     Procedures: No procedures performed   Clinical Data: No additional findings.   Subjective: Chief Complaint  Patient presents with   Right Wrist - Follow-up    HPI [name] comes in today for follow-up of her right wrist fracture.  She states she is doing better.  She is no longer wearing the wrist splint.  She has some slight decreased range of motion especially with volar flexion.  But otherwise is doing well.  Review of Systems See HPI  Objective: Vital Signs: There were no vitals taken for this visit.  Physical Exam General: Well-developed well-nourished female no acute distress. Ortho Exam Right wrist she lacks few degrees in full volar flexion of the right wrist compared to left.  She has full extension both wrists.  Full supination pronation of bilateral forearms.  Nontender over the distal right wrist particularly over the distal radius.  There is no rashes skin lesions ulcerations or impending ulcers. Specialty Comments:  No specialty comments available.  Imaging: XR Wrist Complete Right  Result Date: 01/13/2023 Right wrist  3 views: Right wrist distal radius fracture remains unchanged in overall  position alignment.  Fracture is healed.  No acute findings otherwise.    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   Hypothyroidism due to Hashimoto's thyroiditis 10/15/2015   Degenerative disc disease    Arthritis    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   Top of Left Hand(Tx p Bx)    Family History  Problem Relation Age of Onset   Uterine cancer Mother    Heart attack Mother    Hypertension Mother    Kidney cancer Father    Lung cancer Father    Diabetes Sister  Thyroid disease Neg Hx    Colon cancer Neg Hx    Breast cancer Neg Hx    Ovarian cancer Neg Hx    Endometrial cancer Neg Hx    Pancreatic cancer Neg Hx    Prostate cancer Neg Hx     Past Surgical History:  Procedure Laterality Date   CARPAL TUNNEL RELEASE     BILAT     CATARACT EXTRACTION     BILAT    ECTOPIC PREGNANCY SURGERY     in the setting of prior BTL, lapartomy   EYE SURGERY     IR IMAGING GUIDED PORT INSERTION  12/02/2022   KNEE CARTILAGE SURGERY     RIGHT    (MENISCUS)   MOHS SURGERY     left nose/inner eye for basal cell   TONSILLECTOMY     TOTAL HIP ARTHROPLASTY Left 12/19/2015   TOTAL HIP  ARTHROPLASTY Left 12/19/2015   Procedure: LEFT TOTAL HIP ARTHROPLASTY;  Surgeon: Valeria Batman, MD;  Location: [facility] OR;  Service: Orthopedics;  Laterality: Left;   TRIGGER FINGER RELEASE     TUBAL LIGATION     Social History   Occupational History   Not on file  Tobacco Use   Smoking status: Never   Smokeless tobacco: Never  Vaping Use   Vaping status: Never Used  Substance and Sexual Activity   Alcohol use: Yes    Alcohol/week: 0.0 standard drinks of alcohol    Comment: Rare   Drug use: No   Sexual activity: Yes    Birth control/protection: Surgical, Post-menopausal    Comment: Tubal lig-1st intercourse 72 yo-Fewer than 5 partners

## 2023-01-14 ENCOUNTER — Other Ambulatory Visit: Payer: Medicare PPO

## 2023-01-14 MED FILL — Dexamethasone Sodium Phosphate Inj 100 MG/10ML: INTRAMUSCULAR | Qty: 1 | Status: AC

## 2023-01-14 MED FILL — Fosaprepitant Dimeglumine For IV Infusion 150 MG (Base Eq): INTRAVENOUS | Qty: 5 | Status: AC

## 2023-01-15 ENCOUNTER — Inpatient Hospital Stay: Payer: Medicare PPO | Attending: Gynecologic Oncology | Admitting: Hematology and Oncology

## 2023-01-15 ENCOUNTER — Inpatient Hospital Stay: Payer: Medicare PPO

## 2023-01-15 ENCOUNTER — Other Ambulatory Visit: Payer: Self-pay

## 2023-01-15 ENCOUNTER — Encounter: Payer: Self-pay | Admitting: Hematology and Oncology

## 2023-01-15 VITALS — BP 125/65 | HR 65 | Resp 18 | Ht 62.0 in | Wt 148.0 lb

## 2023-01-15 DIAGNOSIS — N951 Menopausal and female climacteric states: Secondary | ICD-10-CM | POA: Diagnosis not present

## 2023-01-15 DIAGNOSIS — Z5111 Encounter for antineoplastic chemotherapy: Secondary | ICD-10-CM | POA: Insufficient documentation

## 2023-01-15 DIAGNOSIS — Z86718 Personal history of other venous thrombosis and embolism: Secondary | ICD-10-CM | POA: Diagnosis not present

## 2023-01-15 DIAGNOSIS — C55 Malignant neoplasm of uterus, part unspecified: Secondary | ICD-10-CM | POA: Diagnosis not present

## 2023-01-15 DIAGNOSIS — C540 Malignant neoplasm of isthmus uteri: Secondary | ICD-10-CM | POA: Insufficient documentation

## 2023-01-15 DIAGNOSIS — R232 Flushing: Secondary | ICD-10-CM | POA: Diagnosis not present

## 2023-01-15 DIAGNOSIS — D649 Anemia, unspecified: Secondary | ICD-10-CM

## 2023-01-15 DIAGNOSIS — I82411 Acute embolism and thrombosis of right femoral vein: Secondary | ICD-10-CM | POA: Diagnosis not present

## 2023-01-15 DIAGNOSIS — Z79899 Other long term (current) drug therapy: Secondary | ICD-10-CM | POA: Insufficient documentation

## 2023-01-15 DIAGNOSIS — Z7901 Long term (current) use of anticoagulants: Secondary | ICD-10-CM | POA: Diagnosis not present

## 2023-01-15 LAB — CBC WITH DIFFERENTIAL (CANCER CENTER ONLY)
Abs Immature Granulocytes: 0.02 10*3/uL (ref 0.00–0.07)
Basophils Absolute: 0 10*3/uL (ref 0.0–0.1)
Basophils Relative: 0 %
Eosinophils Absolute: 0 10*3/uL (ref 0.0–0.5)
Eosinophils Relative: 0 %
HCT: 32.9 % — ABNORMAL LOW (ref 36.0–46.0)
Hemoglobin: 11.3 g/dL — ABNORMAL LOW (ref 12.0–15.0)
Immature Granulocytes: 0 %
Lymphocytes Relative: 7 %
Lymphs Abs: 0.5 10*3/uL — ABNORMAL LOW (ref 0.7–4.0)
MCH: 30.7 pg (ref 26.0–34.0)
MCHC: 34.3 g/dL (ref 30.0–36.0)
MCV: 89.4 fL (ref 80.0–100.0)
Monocytes Absolute: 0.1 10*3/uL (ref 0.1–1.0)
Monocytes Relative: 2 %
Neutro Abs: 6.6 10*3/uL (ref 1.7–7.7)
Neutrophils Relative %: 91 %
Platelet Count: 238 10*3/uL (ref 150–400)
RBC: 3.68 MIL/uL — ABNORMAL LOW (ref 3.87–5.11)
RDW: 13.3 % (ref 11.5–15.5)
WBC Count: 7.3 10*3/uL (ref 4.0–10.5)
nRBC: 0 % (ref 0.0–0.2)

## 2023-01-15 LAB — CMP (CANCER CENTER ONLY)
ALT: 11 U/L (ref 0–44)
AST: 16 U/L (ref 15–41)
Albumin: 4.2 g/dL (ref 3.5–5.0)
Alkaline Phosphatase: 72 U/L (ref 38–126)
Anion gap: 7 (ref 5–15)
BUN: 17 mg/dL (ref 8–23)
CO2: 28 mmol/L (ref 22–32)
Calcium: 9.7 mg/dL (ref 8.9–10.3)
Chloride: 104 mmol/L (ref 98–111)
Creatinine: 0.67 mg/dL (ref 0.44–1.00)
GFR, Estimated: >60 mL/min (ref >60)
Glucose, Bld: 140 mg/dL — ABNORMAL HIGH (ref 70–99)
Potassium: 4.2 mmol/L (ref 3.5–5.1)
Sodium: 139 mmol/L (ref 135–145)
Total Bilirubin: 0.5 mg/dL (ref 0.3–1.2)
Total Protein: 7.3 g/dL (ref 6.5–8.1)

## 2023-01-15 LAB — TSH: TSH: 0.044 u[IU]/mL — ABNORMAL LOW (ref 0.350–4.500)

## 2023-01-15 MED ORDER — SODIUM CHLORIDE 0.9% FLUSH
10.0000 mL | Freq: Once | INTRAVENOUS | Status: AC
  Administered 2023-01-15: 10 mL

## 2023-01-15 NOTE — Assessment & Plan Note (Signed)
She declined medication for this

## 2023-01-15 NOTE — Assessment & Plan Note (Signed)
This is likely due to recent treatment. The patient denies recent history of bleeding such as epistaxis, hematuria or hematochezia. She is asymptomatic from the anemia. I will observe for now.  She does not require transfusion now. I will continue the chemotherapy at current dose without dosage adjustment.  If the anemia gets progressive worse in the future, I might have to delay her treatment or adjust the chemotherapy dose.  

## 2023-01-15 NOTE — Progress Notes (Signed)
[facility] OFFICE PROGRESS NOTE  Patient Care Team: [name], MD as PCP - General (Family Medicine)  ASSESSMENT & PLAN:  Uterine cancer (HCC) Clinically, she appears to be responding well to treatment and tolerated side effects of treatment so far Overall presentation is most consistent with lower uterine cancer Her bleeding has ceased  Plan to repeat imaging study after 3 cycles of chemotherapy  Acute deep vein thrombosis (DVT) of right lower extremity (HCC) She has no further bleeding recently She will continue anticoagulation therapy and I will review perioperative [name] management with her once surgery is scheduled  Mild anemia This is likely due to recent treatment. The patient denies recent history of bleeding such as epistaxis, hematuria or hematochezia. She is asymptomatic from the anemia. I will observe for now.  She does not require transfusion now. I will continue the chemotherapy at current dose without dosage adjustment.  If the anemia gets progressive worse in the future, I might have to delay her treatment or adjust the chemotherapy dose.   Hot flashes She declined medication for this  Orders Placed This Encounter  Procedures   CT CHEST ABDOMEN PELVIS W CONTRAST    Standing Status:   Future    Standing Expiration Date:   01/15/2024    Order Specific Question:   If indicated for the ordered procedure, I authorize the administration of contrast media per Radiology protocol    Answer:   Yes    Order Specific Question:   Does the patient have a contrast media/X-ray dye allergy?    Answer:   No    Order Specific Question:   Preferred imaging location?    Answer:   [facility]    Order Specific Question:   If indicated for the ordered procedure, I authorize the administration of oral contrast media per Radiology protocol    Answer:   Yes    All questions were answered. The patient knows to call the clinic with any problems, questions or  concerns. The total time spent in the appointment was 30 minutes encounter with patients including review of chart and various tests results, discussions about plan of care and coordination of care plan   Artis Delay, MD 01/15/2023 12:00 PM  INTERVAL HISTORY: Please see below for problem oriented charting. she returns for treatment follow-up seen prior to cycle 3 of chemo Denies neuropathy No recent bleeding She complained of hot flashes and some arthralgias  REVIEW OF SYSTEMS:   Constitutional: Denies fevers, chills or abnormal weight loss Eyes: Denies blurriness of vision Ears, nose, mouth, throat, and face: Denies mucositis or sore throat Respiratory: Denies cough, dyspnea or wheezes Cardiovascular: Denies palpitation, chest discomfort or lower extremity swelling Gastrointestinal:  Denies nausea, heartburn or change in bowel habits Skin: Denies abnormal skin rashes Lymphatics: Denies new lymphadenopathy or easy bruising Neurological:Denies numbness, tingling or new weaknesses Behavioral/Psych: Mood is stable, no new changes  All other systems were reviewed with the patient and are negative.  I have reviewed the past medical history, past surgical history, social history and family history with the patient and they are unchanged from previous note.  ALLERGIES:  is allergic to sulfa antibiotics.  MEDICATIONS:  Current Outpatient Medications  Medication Sig Dispense Refill   Calcium Carb-Cholecalciferol (CALCIUM 1000 + D PO) Take by mouth. (Patient not taking: Reported on 12/21/2022)     calcium carbonate (TUMS) 500 MG chewable tablet Chew 1 tablet by mouth daily.     dexamethasone (DECADRON) 4  MG tablet Take 2 tabs at the night before and 2 tab the morning of chemotherapy, every 3 weeks, by mouth x 6 cycles 24 tablet 6   diclofenac sodium (VOLTAREN) 1 % GEL Apply 2-4 g topically 4 (four) times daily. 3 Tube 3   ELIQUIS 5 MG TABS tablet Take 5 mg by mouth 2 (two) times daily.      Glucosamine 500 MG CAPS 1 capsule Orally twice a day     Glucosamine-Chondroit-Vit C-Mn (GLUCOSAMINE CHONDR 1500 COMPLX PO) Take by mouth.     levothyroxine (SYNTHROID) 75 MCG tablet Take 1 tablet (75 mcg total) by mouth daily. 90 tablet 3   lidocaine-prilocaine (EMLA) cream Apply 1 Application topically as needed. 30 g 3   Multiple Vitamin (MULTIVITAMIN) tablet Take 1 tablet by mouth daily.     ondansetron (ZOFRAN) 8 MG tablet Take 1 tablet (8 mg total) by mouth every 8 (eight) hours as needed for nausea. 30 tablet 3   oxyCODONE (OXY IR/ROXICODONE) 5 MG immediate release tablet Take 1 tablet (5 mg total) by mouth every 4 (four) hours as needed for severe pain. 30 tablet 0   prochlorperazine (COMPAZINE) 10 MG tablet Take 1 tablet (10 mg total) by mouth every 6 (six) hours as needed for nausea or vomiting. 30 tablet 0   VITAMIN D, ERGOCALCIFEROL, PO Take by mouth.     No current facility-administered medications for this visit.    SUMMARY OF ONCOLOGIC HISTORY: Oncology History Overview Note  Er neg, MSI stable, PD-L1 80%   Uterine cancer (HCC)  10/23/2022 Imaging   1. 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 2. Multiple prominent palpable lymph nodes in the right groin measuring up to 2.1 cm, nonspecific. 3. Small right popliteal fossa Baker's cyst.   10/25/2022 Imaging   Ct imaging 1. Pelvic lymphadenopathy with the most prominent lymph node along the right iliac chain measuring 5.6 x 2.8 cm. Metastatic lymphadenopathy or lymphoproliferative disease cannot be excluded.  2. 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 3. Prominent right inguinal lymph nodes. 4. 8 mm sclerotic bone lesion within the right ischium, indeterminate. 5. Aortic atherosclerosis.   Aortic Atherosclerosis  (ICD10-I70.0).       11/11/2022 Pathology Results   SURGICAL PATHOLOGY CASE: [id number] PATIENT: [name] Surgical Pathology Report  Specimen Submitted: A. Lymph node, right pelvic  Clinical History: Pelvic lymphadenopathy  DIAGNOSIS: A. LYMPH NODE, RIGHT PELVIC; CT-GUIDED CORE NEEDLE BIOPSY: - METASTATIC POORLY DIFFERENTIATED CARCINOMA, SEE COMMENT.  Comment: Immunohistochemical studies show tumor cells to be positive for CK7, MOC-31, GATA-3, CK5/6, and calretinin (subset). P16 is strongly and diffusely positive. P53 demonstrates an absence of staining within tumor cells, indicative of a null staining pattern. PAX-8, WT-1, D240, TTF-1, cdx-2, and p40 are negative. The pattern of immunohistochemical staining is non-specific. Based on the morphologic features and pattern of immunohistochemical staining the differential diagnosis includes metastatic urothelial carcinoma, metastatic breast carcinoma, and metastatic serous carcinoma of gyn origin. Correlation with radiographic findings is required.  There is sufficient tissue present for ancillary molecular testing.  IHC slides were prepared by [facility], [place], [place]. All controls stained appropriately.   11/16/2022 PET scan   1. Examination is positive for tracer avid adenopathy within the retroperitoneum, bilateral pelvis and right inguinal region. Findings are compatible with nodal metastasis.  Primary neoplasm is not apparent on the current exam.  2. No signs of solid organ metastasis within the abdomen or pelvis. 3. No signs of tracer avid disease above the diaphragm. 4. Asymmetric subcutaneous edema is identified involving the visualized portions of the right lower extremity. 5. 5 mm perifissural nodule in the left mid lung is too small to characterize by PET-CT. 6. Increased uptake within both lobes of thyroid gland, right greater than left. Correlation with thyroid function tests  advised. 7.  Aortic Atherosclerosis (ICD10-I70.0).   11/19/2022 Initial Diagnosis   Gynecologic malignancy (HCC)   11/20/2022 Cancer Staging   Staging form: Corpus Uteri - Carcinoma and Carcinosarcoma, AJCC 8th Edition - Clinical stage from 11/20/2022: FIGO Stage IVB (cTX, cN2a, pM1) - Signed by Artis Delay, MD on 11/20/2022 Stage prefix: Initial diagnosis   12/02/2022 Procedure   Ultrasound and fluoroscopically guided right internal jugular single lumen power port catheter insertion. Tip in the SVC/RA junction. Catheter ready for use.   12/04/2022 - 12/04/2022 Chemotherapy   Patient is on Treatment Plan : UTERINE Carboplatin AUC 6 + Paclitaxel q21d     12/04/2022 -  Chemotherapy   Patient is on Treatment Plan : UTERINE Pembrolizumab (200), Paclitaxel (175), Carboplatin (5) q21d x 6 cycles / Pembrolizumab (400) q42d     12/15/2022 Imaging   1. No adnexal mass. No uterine mass lesion evident and there is no substantial thickening of the endometrium by MRI. Low signal intensity fibrous stroma of the cervix appears preserved although high signal intensity of the cervical mucosa is somewhat prominent. This could be correlated with Pap smear as clinically warranted. 2. Stable right pelvic sidewall and groin lymphadenopathy. 3. Small Bartholin's cysts bilaterally.     PHYSICAL EXAMINATION: ECOG PERFORMANCE STATUS: 1 - Symptomatic but completely ambulatory  Vitals:   01/15/23 0916  BP: 125/65  Pulse: 65  Resp: 18  SpO2: 100%   Filed Weights   01/15/23 0916  Weight: 148 lb (67.1 kg)    GENERAL:alert, no distress and comfortable SKIN: skin color, texture, turgor are normal, no rashes or significant lesions EYES: normal, Conjunctiva are pink and non-injected, sclera clear OROPHARYNX:no exudate, no erythema and lips, buccal mucosa, and tongue normal  NECK: supple, thyroid normal size, non-tender, without nodularity LYMPH:  no palpable lymphadenopathy in the cervical, axillary or  inguinal LUNGS: clear to auscultation and percussion with normal breathing effort HEART: regular rate & rhythm and no murmurs and no lower extremity edema ABDOMEN:abdomen soft, non-tender and normal bowel sounds Musculoskeletal:no cyanosis of digits and no clubbing  NEURO: alert & oriented x 3 with fluent speech, no focal motor/sensory deficits  LABORATORY DATA:  I have reviewed the data as listed    Component Value Date/Time   NA 139 01/15/2023 0837   K 4.2 01/15/2023 0837   CL 104 01/15/2023 0837   CO2 28 01/15/2023 0837   GLUCOSE 140 (H) 01/15/2023 0837   BUN 17 01/15/2023 0837   CREATININE 0.67 01/15/2023 0837   CREATININE 0.83 12/14/2016 1247   CALCIUM 9.7 01/15/2023 0837   PROT 7.3 01/15/2023 0837   ALBUMIN 4.2 01/15/2023 0837   AST 16 01/15/2023 0837   ALT 11 01/15/2023 0837   ALKPHOS 72 01/15/2023 0837   BILITOT 0.5 01/15/2023 0837   GFRNONAA >60 01/15/2023 0837   GFRNONAA 81 11/28/2015 1516   GFRAA >60 12/21/2015 0432   GFRAA >89 11/28/2015 1516    No results found for: "SPEP", "UPEP"  Lab Results  Component Value Date  WBC 7.3 01/15/2023   NEUTROABS 6.6 01/15/2023   HGB 11.3 (L) 01/15/2023   HCT 32.9 (L) 01/15/2023   MCV 89.4 01/15/2023   PLT 238 01/15/2023      Chemistry      Component Value Date/Time   NA 139 01/15/2023 0837   K 4.2 01/15/2023 0837   CL 104 01/15/2023 0837   CO2 28 01/15/2023 0837   BUN 17 01/15/2023 0837   CREATININE 0.67 01/15/2023 0837   CREATININE 0.83 12/14/2016 1247      Component Value Date/Time   CALCIUM 9.7 01/15/2023 0837   ALKPHOS 72 01/15/2023 0837   AST 16 01/15/2023 0837   ALT 11 01/15/2023 0837   BILITOT 0.5 01/15/2023 0837       RADIOGRAPHIC STUDIES: I have personally reviewed the radiological images as listed and agreed with the findings in the report. XR Wrist Complete Right  Result Date: 01/13/2023 Right wrist  3 views: Right wrist distal radius fracture remains unchanged in overall position  alignment.  Fracture is healed.  No acute findings otherwise.

## 2023-01-15 NOTE — Assessment & Plan Note (Signed)
Clinically, she appears to be responding well to treatment and tolerated side effects of treatment so far Overall presentation is most consistent with lower uterine cancer Her bleeding has ceased  Plan to repeat imaging study after 3 cycles of chemotherapy

## 2023-01-15 NOTE — Assessment & Plan Note (Signed)
She has no further bleeding recently She will continue anticoagulation therapy and I will review perioperative [facility] management with her once surgery is scheduled

## 2023-01-16 ENCOUNTER — Encounter: Payer: Self-pay | Admitting: Hematology and Oncology

## 2023-01-16 LAB — T4: T4, Total: 11.5 ug/dL (ref 4.5–12.0)

## 2023-01-18 ENCOUNTER — Telehealth: Payer: Self-pay | Admitting: Oncology

## 2023-01-18 ENCOUNTER — Telehealth: Payer: Self-pay | Admitting: Hematology and Oncology

## 2023-01-18 MED FILL — Fosaprepitant Dimeglumine For IV Infusion 150 MG (Base Eq): INTRAVENOUS | Qty: 5 | Status: AC

## 2023-01-18 MED FILL — Dexamethasone Sodium Phosphate Inj 100 MG/10ML: INTRAMUSCULAR | Qty: 1 | Status: AC

## 2023-01-18 NOTE — Telephone Encounter (Signed)
Called [name] and advised her of appointment on 02/01/23 at 2:30 with Dr. Alvester Morin to discuss surgery.  Advised that we will be holding 02/16/23 as a potential surgery date.  [name] verbalized understanding and agreement.  She also asked if she needs to drink the oral contrast for her upcoming CT scan.  Advised I will check with Dr. Bertis Ruddy and call her back.

## 2023-01-18 NOTE — Telephone Encounter (Signed)
Called [place] back and advised that she does not need to drink the oral contrast for the CT scan.

## 2023-01-18 NOTE — Telephone Encounter (Signed)
Spoke with patient confirming upcoming appointment  

## 2023-01-19 ENCOUNTER — Inpatient Hospital Stay: Payer: Medicare PPO

## 2023-01-19 ENCOUNTER — Other Ambulatory Visit: Payer: Self-pay

## 2023-01-19 VITALS — BP 119/78 | HR 72 | Temp 98.1°F | Resp 16

## 2023-01-19 DIAGNOSIS — C55 Malignant neoplasm of uterus, part unspecified: Secondary | ICD-10-CM

## 2023-01-19 DIAGNOSIS — Z5111 Encounter for antineoplastic chemotherapy: Secondary | ICD-10-CM | POA: Diagnosis not present

## 2023-01-19 MED ORDER — SODIUM CHLORIDE 0.9 % IV SOLN
200.0000 mg | Freq: Once | INTRAVENOUS | Status: AC
  Administered 2023-01-19: 200 mg via INTRAVENOUS
  Filled 2023-01-19: qty 200

## 2023-01-19 MED ORDER — FAMOTIDINE IN NACL 20-0.9 MG/50ML-% IV SOLN
20.0000 mg | Freq: Once | INTRAVENOUS | Status: AC
  Administered 2023-01-19: 20 mg via INTRAVENOUS
  Filled 2023-01-19: qty 50

## 2023-01-19 MED ORDER — SODIUM CHLORIDE 0.9 % IV SOLN
402.5000 mg | Freq: Once | INTRAVENOUS | Status: AC
  Administered 2023-01-19: 400 mg via INTRAVENOUS
  Filled 2023-01-19: qty 40

## 2023-01-19 MED ORDER — SODIUM CHLORIDE 0.9% FLUSH
10.0000 mL | INTRAVENOUS | Status: DC | PRN
  Administered 2023-01-19: 10 mL

## 2023-01-19 MED ORDER — PALONOSETRON HCL INJECTION 0.25 MG/5ML
0.2500 mg | Freq: Once | INTRAVENOUS | Status: AC
  Administered 2023-01-19: 0.25 mg via INTRAVENOUS
  Filled 2023-01-19: qty 5

## 2023-01-19 MED ORDER — SODIUM CHLORIDE 0.9 % IV SOLN
150.0000 mg | Freq: Once | INTRAVENOUS | Status: AC
  Administered 2023-01-19: 150 mg via INTRAVENOUS
  Filled 2023-01-19: qty 150
  Filled 2023-01-19: qty 5

## 2023-01-19 MED ORDER — SODIUM CHLORIDE 0.9 % IV SOLN: Freq: Once | INTRAVENOUS | Status: AC

## 2023-01-19 MED ORDER — SODIUM CHLORIDE 0.9 % IV SOLN
175.0000 mg/m2 | Freq: Once | INTRAVENOUS | Status: AC
  Administered 2023-01-19: 300 mg via INTRAVENOUS
  Filled 2023-01-19: qty 50

## 2023-01-19 MED ORDER — CETIRIZINE HCL 10 MG/ML IV SOLN
10.0000 mg | Freq: Once | INTRAVENOUS | Status: AC
  Administered 2023-01-19: 10 mg via INTRAVENOUS
  Filled 2023-01-19: qty 1

## 2023-01-19 MED ORDER — SODIUM CHLORIDE 0.9 % IV SOLN
10.0000 mg | Freq: Once | INTRAVENOUS | Status: AC
  Administered 2023-01-19: 10 mg via INTRAVENOUS
  Filled 2023-01-19: qty 10
  Filled 2023-01-19: qty 1

## 2023-01-19 MED ORDER — HEPARIN SOD (PORK) LOCK FLUSH 100 UNIT/ML IV SOLN
500.0000 [IU] | Freq: Once | INTRAVENOUS | Status: AC | PRN
  Administered 2023-01-19: 500 [IU]

## 2023-01-19 NOTE — Patient Instructions (Signed)
[facility]  Discharge Instructions: Thank you for choosing [facility] to provide your oncology and hematology care.   If you have a lab appointment with the Cancer Center, please go directly to the Cancer Center and check in at the registration area.   Wear comfortable clothing and clothing appropriate for easy access to any Portacath or PICC line.   We strive to give you quality time with your provider. You may need to reschedule your appointment if you arrive late (15 or more minutes).  Arriving late affects you and other patients whose appointments are after yours.  Also, if you miss three or more appointments without notifying the office, you may be dismissed from the clinic at the provider's discretion.      For prescription refill requests, have your pharmacy contact our office and allow 72 hours for refills to be completed.    Today you received the following chemotherapy and/or immunotherapy agents: Keytruda/Taxol/Carboplatin      To help prevent nausea and vomiting after your treatment, we encourage you to take your nausea medication as directed.  BELOW ARE SYMPTOMS THAT SHOULD BE REPORTED IMMEDIATELY: *FEVER GREATER THAN 100.4 F (38 C) OR HIGHER *CHILLS OR SWEATING *NAUSEA AND VOMITING THAT IS NOT CONTROLLED WITH YOUR NAUSEA MEDICATION *UNUSUAL SHORTNESS OF BREATH *UNUSUAL BRUISING OR BLEEDING *URINARY PROBLEMS (pain or burning when urinating, or frequent urination) *BOWEL PROBLEMS (unusual diarrhea, constipation, pain near the anus) TENDERNESS IN MOUTH AND THROAT WITH OR WITHOUT PRESENCE OF ULCERS (sore throat, sores in mouth, or a toothache) UNUSUAL RASH, SWELLING OR PAIN  UNUSUAL VAGINAL DISCHARGE OR ITCHING   Items with * indicate a potential emergency and should be followed up as soon as possible or go to the Emergency Department if any problems should occur.  Please show the CHEMOTHERAPY ALERT CARD or IMMUNOTHERAPY  ALERT CARD at check-in to the Emergency Department and triage nurse.  Should you have questions after your visit or need to cancel or reschedule your appointment, please contact [facility]  Dept: [phone]  and follow the prompts.  Office hours are 8:00 a.m. to 4:30 p.m. Monday - Friday. Please note that voicemails left after 4:00 p.m. may not be returned until the following business day.  We are closed weekends and major holidays. You have access to a nurse at all times for urgent questions. Please call the main number to the clinic Dept: [phone] and follow the prompts.   For any non-urgent questions, you may also contact your provider using MyChart. We now offer e-Visits for anyone 18 and older to request care online for non-urgent symptoms. For details visit [email or web address].   Also download the MyChart app! Go to the app store, search "MyChart", open the app, select [place], and log in with your MyChart username and password.   

## 2023-01-27 ENCOUNTER — Encounter: Payer: Self-pay | Admitting: Genetic Counselor

## 2023-01-27 ENCOUNTER — Inpatient Hospital Stay: Payer: Medicare PPO | Admitting: Genetic Counselor

## 2023-01-27 ENCOUNTER — Other Ambulatory Visit: Payer: Self-pay

## 2023-01-27 DIAGNOSIS — C55 Malignant neoplasm of uterus, part unspecified: Secondary | ICD-10-CM

## 2023-01-27 DIAGNOSIS — Z8051 Family history of malignant neoplasm of kidney: Secondary | ICD-10-CM

## 2023-01-27 DIAGNOSIS — Z8049 Family history of malignant neoplasm of other genital organs: Secondary | ICD-10-CM | POA: Diagnosis not present

## 2023-01-27 NOTE — Progress Notes (Signed)
REFERRING PROVIDER: [name], MD [address] [place],  [place] [address]  PRIMARY PROVIDER:  [name], MD  PRIMARY REASON FOR VISIT:  1. Family history of uterine cancer   2. Family history of kidney cancer   3. Malignant neoplasm of uterus, unspecified site [facility])      HISTORY OF PRESENT ILLNESS:   [name], a 72 y.o. female, was seen for a [place] cancer genetics consultation at the request of Dr. Roney Zavala due to a personal and family history of uterine cancer.  [name] presents to clinic today to discuss the possibility of a hereditary predisposition to cancer, genetic testing, and to further clarify her future cancer risks, as well as potential cancer risks for family members.   In April 2024, at the age of 23, [name] was diagnosed with uterine cancer.  The treatment plan includes chemotherapy.    CANCER HISTORY:  Oncology History Overview Note  Er neg, MSI stable, PD-L1 80%   Uterine cancer (HCC)  10/23/2022 Imaging   1. 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 2. Multiple prominent palpable lymph nodes in the right groin measuring up to 2.1 cm, nonspecific. 3. Small right popliteal fossa Baker's cyst.   10/25/2022 Imaging   Ct imaging 1. Pelvic lymphadenopathy with the most prominent lymph node along the right iliac chain measuring 5.6 x 2.8 cm. Metastatic lymphadenopathy or lymphoproliferative disease cannot be excluded.  2. 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 3. Prominent right inguinal lymph nodes. 4. 8 mm sclerotic bone lesion within the right ischium, indeterminate. 5. Aortic atherosclerosis.   Aortic Atherosclerosis (ICD10-I70.0).       11/11/2022 Pathology Results   SURGICAL PATHOLOGY CASE:  [id number] PATIENT: [name] Surgical Pathology Report  Specimen Submitted: A. Lymph node, right pelvic  Clinical History: Pelvic lymphadenopathy  DIAGNOSIS: A. LYMPH NODE, RIGHT PELVIC; CT-GUIDED CORE NEEDLE BIOPSY: - METASTATIC POORLY DIFFERENTIATED CARCINOMA, SEE COMMENT.  Comment: Immunohistochemical studies show tumor cells to be positive for CK7, MOC-31, GATA-3, CK5/6, and calretinin (subset). P16 is strongly and diffusely positive. P53 demonstrates an absence of staining within tumor cells, indicative of a null staining pattern. PAX-8, WT-1, D240, TTF-1, cdx-2, and p40 are negative. The pattern of immunohistochemical staining is non-specific. Based on the morphologic features and pattern of immunohistochemical staining the differential diagnosis includes metastatic urothelial carcinoma, metastatic breast carcinoma, and metastatic serous carcinoma of gyn origin. Correlation with radiographic findings is required.  There is sufficient tissue present for ancillary molecular testing.  IHC slides were prepared by [facility] for Molecular Biology and Pathology, [place], [place]. All controls stained appropriately.   11/16/2022 PET scan   1. Examination is positive for tracer avid adenopathy within the retroperitoneum, bilateral pelvis and right inguinal region. Findings are compatible with nodal metastasis. Primary neoplasm is not apparent on the current exam.  2. No signs of solid organ metastasis within the abdomen or pelvis. 3. No signs of tracer avid disease above the diaphragm. 4. Asymmetric subcutaneous edema is identified involving the visualized portions of the right lower extremity. 5. 5 mm perifissural nodule in the left mid lung is too small to characterize by PET-CT. 6. Increased uptake within both lobes of thyroid gland, right greater than left. Correlation with thyroid function tests advised. 7.  Aortic Atherosclerosis (ICD10-I70.0).   11/19/2022 Initial  Diagnosis    Gynecologic malignancy (HCC)   11/20/2022 Cancer Staging   Staging form: Corpus Uteri - Carcinoma and Carcinosarcoma, AJCC 8th Edition - Clinical stage from 11/20/2022: FIGO Stage IVB (cTX, cN2a, pM1) - Signed by Artis Delay, MD on 11/20/2022 Stage prefix: Initial diagnosis   12/02/2022 Procedure   Ultrasound and fluoroscopically guided right internal jugular single lumen power port catheter insertion. Tip in the SVC/RA junction. Catheter ready for use.   12/04/2022 - 12/04/2022 Chemotherapy   Patient is on Treatment Plan : UTERINE Carboplatin AUC 6 + Paclitaxel q21d     12/04/2022 -  Chemotherapy   Patient is on Treatment Plan : UTERINE Pembrolizumab (200), Paclitaxel (175), Carboplatin (5) q21d x 6 cycles / Pembrolizumab (400) q42d     12/15/2022 Imaging   1. No adnexal mass. No uterine mass lesion evident and there is no substantial thickening of the endometrium by MRI. Low signal intensity fibrous stroma of the cervix appears preserved although high signal intensity of the cervical mucosa is somewhat prominent. This could be correlated with Pap smear as clinically warranted. 2. Stable right pelvic sidewall and groin lymphadenopathy. 3. Small Bartholin's cysts bilaterally.      RISK FACTORS:  Menarche was at age 68.  First live birth at age 55.  OCP use for approximately 10 years.  Ovaries intact: yes.  Hysterectomy: no.  Menopausal status: postmenopausal.  HRT use: 0 years. Colonoscopy: yes; normal. Mammogram within the last year: yes. Number of breast biopsies: 0. Up to date with pelvic exams: no. 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   Top of Left Hand(Tx p Bx)    Past Surgical History:  Procedure Laterality Date   CARPAL TUNNEL RELEASE     BILAT     CATARACT EXTRACTION     BILAT    ECTOPIC PREGNANCY SURGERY     in the setting of prior BTL, lapartomy   EYE SURGERY     IR IMAGING GUIDED PORT INSERTION  12/02/2022   KNEE CARTILAGE SURGERY     RIGHT    (MENISCUS)   MOHS SURGERY     left nose/inner eye for basal cell   TONSILLECTOMY     TOTAL HIP ARTHROPLASTY Left 12/19/2015   TOTAL HIP ARTHROPLASTY Left 12/19/2015   Procedure: LEFT TOTAL HIP ARTHROPLASTY;  Surgeon: Valeria Batman, MD;  Location: [facility] OR;  Service: Orthopedics;  Laterality: Left;   TRIGGER FINGER RELEASE     TUBAL LIGATION      Social History   Socioeconomic History   Marital status: Married    Spouse name: Not on file   Number of children: Not on file   Years of education: Not on file   Highest education level: Not on file  Occupational History   Not on file  Tobacco Use   Smoking status: Never   Smokeless tobacco: Never  Vaping Use   Vaping status: Never Used  Substance and Sexual Activity   Alcohol use: Yes  Alcohol/week: 0.0 standard drinks of alcohol    Comment: Rare   Drug use: No   Sexual activity: Yes    Birth control/protection: Surgical, Post-menopausal    Comment: Tubal lig-1st intercourse 72 yo-Fewer than 5 partners  Other Topics Concern   Not on file  Social History Narrative   Not on file   Social Determinants of Health   Financial Resource Strain: Not on file  Food Insecurity: Low Risk  (12/17/2022)   [facility]   Food vital sign    Within the past 12 months, you worried that your food would run out before you got money to buy more: Never true    Within the past 12 months, the food you bought just didn't last and you didn't have money to get more. : Never true   Transportation Needs: No Transportation Needs (12/17/2022)   [facility]   Transportation    In the past 12 months, has lack of reliable transportation kept you from medical appointments, meetings, work or from getting things needed for daily living? : No  Physical Activity: Not on file  Stress: Not on file  Social Connections: Not on file     FAMILY HISTORY:  We obtained a detailed, 4-generation family history.  Significant diagnoses are listed below: Family History  Problem Relation Age of Onset   Uterine cancer Mother 84   Heart attack Mother    Hypertension Mother    Kidney cancer Father 27   Lung cancer Father    Diabetes Sister    Thyroid disease Neg Hx    Colon cancer Neg Hx    Breast cancer Neg Hx    Ovarian cancer Neg Hx    Endometrial cancer Neg Hx    Pancreatic cancer Neg Hx    Prostate cancer Neg Hx      The patient has two daughters who are cancer free.  She has a sister who is cancer free.  Both parents are deceased.  The patient's mother had uterine cancer at 27.  She had two brothers who are cancer free.  There are no other reports of cancer.  The patient's father had kidney cancer at 79.  He had one sister who was cancer free.  There are no other reports of cancer.  [name] is unaware of previous family history of genetic testing for hereditary cancer risks. Patient's maternal ancestors are of [place] descent, and paternal ancestors are of Caucasian descent. There is no reported Ashkenazi Jewish ancestry. There is no known consanguinity.  GENETIC COUNSELING ASSESSMENT: [name] is a 72 y.o. female with a personal and family history of uterine cancer which is somewhat suggestive of a familial predisposition to cancer. We, therefore, discussed and recommended the following at today's visit.   DISCUSSION: We discussed that, in general, most cancer is not inherited in families, but instead is sporadic or familial. Sporadic  cancers occur by chance and typically happen at older ages (>50 years) as this type of cancer is caused by genetic changes acquired during an individual's lifetime. Some families have more cancers than would be expected by chance; however, the ages or types of cancer are not consistent with a known genetic mutation or known genetic mutations have been ruled out. This type of familial cancer is thought to be due to a combination of multiple genetic, environmental, hormonal, and lifestyle factors. While this combination of factors likely increases the risk of cancer, the exact source  of this risk is not currently identifiable or testable.  We discussed that 12 - 13% of cancer is hereditary, with most cases uterine cancer associated with Lynch syndrome.  We discussed the national guidelines for Lynch syndrome including having 3 cases of Lynch syndrome cancer in the family, diagnosis under 50 or having MSI/IHC patterns suggestive of Lynch syndrome.  [name] has normal IHC/MSI testing and does not meet guidelines in other ways.  There are other genes that can be associated with hereditary uterine cancer syndromes.  These include PTEN and FH.  We discussed that testing is beneficial for several reasons including knowing how to follow individuals after completing their treatment, identifying whether potential treatment options such as PARP inhibitors would be beneficial, and understand if other family members could be at risk for cancer and allow them to undergo genetic testing.   We reviewed the characteristics, features and inheritance patterns of hereditary cancer syndromes. We also discussed genetic testing, including the appropriate family members to test, the process of testing, insurance coverage and turn-around-time for results. We discussed the implications of a negative, positive, carrier and/or variant of uncertain significant result. We discussed with [name] that the personal and family history  does not meet insurance or NCCN criteria for genetic testing and, therefore, is not highly consistent with a familial hereditary cancer syndrome.  We feel she is at low risk to harbor a gene mutation associated with such a condition. We discussed that testing could occur for the out of pocket cost of $249.  [name] and her husband wanted to think about it. Thus, we did not recommend any genetic testing, at this time, and recommended [name] continue to follow the cancer screening guidelines given by her primary healthcare provider.  PLAN: [name] did not wish to pursue genetic testing at today's visit. We understand this decision and remain available to coordinate genetic testing at any time in the future. We, therefore, recommend [name] continue to follow the cancer screening guidelines given by her primary healthcare provider.  Lastly, we encouraged [name] to remain in contact with cancer genetics annually so that we can continuously update the family history and inform her of any changes in cancer genetics and testing that may be of benefit for this family.   [name] questions were answered to her satisfaction today. Our contact information was provided should additional questions or concerns arise. Thank you for the referral and allowing [place] to share in the care of your patient.   Manika Hast P. Lowell Guitar, MS, [facility] Licensed, Patent attorney [email or web address] phone: [phone]  The patient was seen for a total of 35 minutes in face-to-face genetic counseling.  The patient brought her husband. Drs. Meliton Rattan, and/or [place] were available for questions, if needed..    _______________________________________________________________________ For Office Staff:  Number of people involved in session: 2 Was an Intern/ student involved with case: no

## 2023-02-01 ENCOUNTER — Other Ambulatory Visit: Payer: Self-pay

## 2023-02-01 ENCOUNTER — Inpatient Hospital Stay (HOSPITAL_BASED_OUTPATIENT_CLINIC_OR_DEPARTMENT_OTHER): Payer: Medicare PPO | Admitting: Gynecologic Oncology

## 2023-02-01 ENCOUNTER — Inpatient Hospital Stay: Payer: Medicare PPO | Attending: Gynecologic Oncology | Admitting: Psychiatry

## 2023-02-01 VITALS — BP 109/58 | HR 74 | Temp 98.8°F | Resp 16 | Ht 62.0 in | Wt 148.5 lb

## 2023-02-01 DIAGNOSIS — I82411 Acute embolism and thrombosis of right femoral vein: Secondary | ICD-10-CM

## 2023-02-01 DIAGNOSIS — R5383 Other fatigue: Secondary | ICD-10-CM | POA: Diagnosis not present

## 2023-02-01 DIAGNOSIS — Z86718 Personal history of other venous thrombosis and embolism: Secondary | ICD-10-CM | POA: Diagnosis not present

## 2023-02-01 DIAGNOSIS — C778 Secondary and unspecified malignant neoplasm of lymph nodes of multiple regions: Secondary | ICD-10-CM | POA: Insufficient documentation

## 2023-02-01 DIAGNOSIS — R600 Localized edema: Secondary | ICD-10-CM

## 2023-02-01 DIAGNOSIS — I890 Lymphedema, not elsewhere classified: Secondary | ICD-10-CM | POA: Insufficient documentation

## 2023-02-01 DIAGNOSIS — C801 Malignant (primary) neoplasm, unspecified: Secondary | ICD-10-CM | POA: Diagnosis present

## 2023-02-01 DIAGNOSIS — Z9221 Personal history of antineoplastic chemotherapy: Secondary | ICD-10-CM | POA: Insufficient documentation

## 2023-02-01 DIAGNOSIS — R590 Localized enlarged lymph nodes: Secondary | ICD-10-CM

## 2023-02-01 DIAGNOSIS — C549 Malignant neoplasm of corpus uteri, unspecified: Secondary | ICD-10-CM

## 2023-02-01 MED ORDER — SENNOSIDES-DOCUSATE SODIUM 8.6-50 MG PO TABS
2.0000 | ORAL_TABLET | Freq: Every day | ORAL | 0 refills | Status: DC
Start: 2023-02-01 — End: 2023-03-19

## 2023-02-01 MED ORDER — OXYCODONE HCL 5 MG PO TABS
5.0000 mg | ORAL_TABLET | ORAL | 0 refills | Status: DC | PRN
Start: 2023-02-01 — End: 2023-04-09

## 2023-02-01 NOTE — Progress Notes (Signed)
Gynecologic Oncology Return Clinic Visit  Date of Service: 02/01/2023 Referring Provider: [name], MD   Assessment & Plan: [name] is a 72 y.o. 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  Reviewed patient's prior imaging in detail. Main findings previously included enlarged right inguinofemoral lymph nodes, pelvic lymph nodes and retrocaval lymph node, the largest being a right external iliac lymph node.   Based on exam, her previously palpable right groin lymphadenopathy is no longer palpable, so this is an optimistic sign for response to treatment. She has interval imaging scheduled for 02/05/23.   Assuming appropriate response on imaging, we will proceed with interval debulking surgery. We discussed that this would include a robotic assisted total laparoscopic hysterectomy, bilateral salpingo-oopohorectomy, pelvic lymph node dissection if residual enlarged lymph nodes (likely), possible inguinofemoral lymph node dissection if residual enlarged lymph nodes, and mini-laparotomy for omentectomy. If there was any other evidence of disease in the abdomen or pelvis, we would attempt to resect this as well.   Patient was consented for: Robotic assisted total laparoscopic hysterectomy, bilateral salpingo-oophorectomy, pelvic lymph node dissection, possible inguinofemoral lymph node dissection, minilaparotomy for omentectomy, debulking on 02/16/23.  The risks of surgery were discussed in detail and she understands these to including but not limited to bleeding requiring a blood transfusion, infection, injury to adjacent organs (including but not limited to the bowels, bladder, ureters, nerves, blood vessels), thromboembolic events, wound separation, hernia, vaginal cuff separation, possible  risk of lymphedema and lymphocyst if lymphadenectomy performed, unforseen complication, and possible need for re-exploration.  If the patient experiences any of these events, she understands that her hospitalization or recovery may be prolonged and that she may need to take additional medications for a prolonged period. The patient will receive DVT and antibiotic prophylaxis as indicated. She voiced a clear understanding. She had the opportunity to ask questions and informed consent was obtained today. She wishes to proceed.  She will hold her eliquis 2 days prior to surgery and on day of surgery.  She does not require preoperative clearance. Her METs are >4.  All preoperative instructions were reviewed. Postoperative expectations were also reviewed. Written handouts were provided to the patient.  We will also plan referral to lymphedema clinic given concern for ongoing right lower extremity edema. Previously thought in part to be due to DVT, but may also be due to lymphadenopathy and anticipate at least lymph node sampling for debulking during surgery.    RTC postop  [name], MD Gynecologic Oncology   Medical Decision Making I personally spent  TOTAL 45 minutes face-to-face and non-face-to-face in the care of this patient, which includes all pre, intra, and post visit time on the date of service.   ----------------------- Reason for Visit: Follow-up  Treatment History: Oncology History Overview Note  Er neg, MSI stable, PD-L1 80% No genetic testing done, did not meet criteria   Uterine cancer (HCC)  10/23/2022 Imaging   1. 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 2. Multiple prominent palpable lymph nodes in the right groin measuring up to 2.1 cm, nonspecific. 3. Small right popliteal fossa Baker's cyst.   10/25/2022 Imaging   Ct imaging 1. Pelvic lymphadenopathy with the most prominent lymph node along the right iliac chain  measuring 5.6 x 2.8 cm. Metastatic lymphadenopathy or lymphoproliferative disease  cannot be excluded.  2. 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 3. Prominent right inguinal lymph nodes. 4. 8 mm sclerotic bone lesion within the right ischium, indeterminate. 5. Aortic atherosclerosis.   Aortic Atherosclerosis (ICD10-I70.0).       11/11/2022 Pathology Results   SURGICAL PATHOLOGY CASE: [id number] PATIENT: [name] Surgical Pathology Report  Specimen Submitted: A. Lymph node, right pelvic  Clinical History: Pelvic lymphadenopathy  DIAGNOSIS: A. LYMPH NODE, RIGHT PELVIC; CT-GUIDED CORE NEEDLE BIOPSY: - METASTATIC POORLY DIFFERENTIATED CARCINOMA, SEE COMMENT.  Comment: Immunohistochemical studies show tumor cells to be positive for CK7, MOC-31, GATA-3, CK5/6, and calretinin (subset). P16 is strongly and diffusely positive. P53 demonstrates an absence of staining within tumor cells, indicative of a null staining pattern. PAX-8, WT-1, D240, TTF-1, cdx-2, and p40 are negative. The pattern of immunohistochemical staining is non-specific. Based on the morphologic features and pattern of immunohistochemical staining the differential diagnosis includes metastatic urothelial carcinoma, metastatic breast carcinoma, and metastatic serous carcinoma of gyn origin. Correlation with radiographic findings is required.  There is sufficient tissue present for ancillary molecular testing.  IHC slides were prepared by [facility], [place]. All controls stained appropriately.   11/16/2022 PET scan   1. Examination is positive for tracer avid adenopathy within the retroperitoneum, bilateral pelvis and right inguinal region. Findings are compatible with nodal metastasis. Primary neoplasm is not  apparent on the current exam.  2. No signs of solid organ metastasis within the abdomen or pelvis. 3. No signs of tracer avid disease above the diaphragm. 4. Asymmetric subcutaneous edema is identified involving the visualized portions of the right lower extremity. 5. 5 mm perifissural nodule in the left mid lung is too small to characterize by PET-CT. 6. Increased uptake within both lobes of thyroid gland, right greater than left. Correlation with thyroid function tests advised. 7.  Aortic Atherosclerosis (ICD10-I70.0).   11/19/2022 Initial Diagnosis   Gynecologic malignancy (HCC)   11/20/2022 Cancer Staging   Staging form: Corpus Uteri - Carcinoma and Carcinosarcoma, AJCC 8th Edition - Clinical stage from 11/20/2022: FIGO Stage IVB (cTX, cN2a, pM1) - Signed by Artis Delay, MD on 11/20/2022 Stage prefix: Initial diagnosis   12/02/2022 Procedure   Ultrasound and fluoroscopically guided right internal jugular single lumen power port catheter insertion. Tip in the SVC/RA junction. Catheter ready for use.   12/04/2022 - 12/04/2022 Chemotherapy   Patient is on Treatment Plan : UTERINE Carboplatin AUC 6 + Paclitaxel q21d     12/04/2022 -  Chemotherapy   Patient is on Treatment Plan : UTERINE Pembrolizumab (200), Paclitaxel (175), Carboplatin (5) q21d x 6 cycles / Pembrolizumab (400) q42d     12/15/2022 Imaging   1. No adnexal mass. No uterine mass lesion evident and there is no substantial thickening of the endometrium by MRI. Low signal intensity fibrous stroma of the cervix appears preserved although high signal intensity of the cervical mucosa is somewhat prominent. This could be correlated with Pap smear as clinically warranted. 2. Stable right pelvic sidewall and groin lymphadenopathy. 3. Small Bartholin's cysts bilaterally.   02/05/2023 Imaging   CT CHEST ABDOMEN PELVIS W CONTRAST  Result Date: 02/05/2023 CLINICAL DATA:  History of endometrial cancer, monitor. High-risk. * Tracking Code: BO *  EXAM: CT CHEST, ABDOMEN, AND PELVIS WITH CONTRAST TECHNIQUE: Multidetector CT imaging of the chest, abdomen and  pelvis was performed following the standard protocol during bolus administration of intravenous contrast. RADIATION DOSE REDUCTION: This exam was performed according to the departmental dose-optimization program which includes automated exposure control, adjustment of the mA and/or kV according to patient size and/or use of iterative reconstruction technique. CONTRAST:  OMNIPAQUE IOHEXOL 300 MG/ML  SOLN COMPARISON:  Multiple priors including MRI pelvis December 15, 2022 PET-CT Nov 16, 2022 and CT abdomen pelvis October 25, 2022. FINDINGS: CT CHEST FINDINGS Cardiovascular: Right chest wall Port-A-Cath with the tip in the right atrium. Aortic atherosclerosis. Normal caliber thoracic aorta. No central pulmonary embolus on this nondedicated study. Normal size heart. No significant pericardial effusion/thickening Mediastinum/Nodes: No suspicious thyroid nodule. No pathologically enlarged mediastinal, hilar or axillary lymph nodes the esophagus is grossly unremarkable. Lungs/Pleura: Stable scattered tiny pulmonary nodules. For instance: -2 mm right upper lobe pulmonary nodule on image 29/3, unchanged -2 mm pulmonary nodule along the right major fissure on image 73/3, unchanged -4 mm pulmonary nodule along the left major fissure on image 73/3, unchanged. No pleural effusion.  No pneumothorax. Musculoskeletal: No suspicious chest wall lesion. No aggressive lytic or blastic lesion of bone. CT ABDOMEN PELVIS FINDINGS Hepatobiliary: Diffuse hepatic steatosis. Gallbladder is unremarkable. No biliary ductal dilation Pancreas: No pancreatic ductal dilation or evidence of acute inflammation Spleen: No splenomegaly. Adrenals/Urinary Tract: Bilateral adrenal glands appear normal. No hydronephrosis. Kidneys demonstrate symmetric enhancement. No suspicious renal mass. Urinary bladder is not well evaluated due to  underdistention and streak artifact from left hip arthroplasty. Stomach/Bowel: No radiopaque enteric contrast material was administered. Hyperdensity along the posterior aspect of the stomach on image 53/2 appears similar on delayed imaging and is compatible with ingested material. No pathologic dilation of large or small bowel. No evidence of acute bowel inflammation. Vascular/Lymphatic: Normal caliber abdominal aorta. Aortic atherosclerosis. The portal, splenic and superior mesenteric veins are patent. Decreased size of the retroperitoneal, bilateral pelvic sidewall/iliac side chain and right inguinal lymph nodes. For reference: -Retrocaval lymph node measures 6 mm in short axis on image 69/2 previously 11 mm -Right sidewall lymph node measures 1.5 cm in short axis on image 110/2 previously 2.9 cm Reproductive: Evaluation of the pelvic reproductive structures is limited by streak artifact from left hip arthroplasty. Other: No significant abdominopelvic free fluid. No discrete peritoneal or omental nodularity. Musculoskeletal: No aggressive lytic or blastic lesion of bone. Multilevel degenerative changes spine. Left total hip arthroplasty. Degenerative change of the right hip. IMPRESSION: 1. Decreased size of the retroperitoneal, bilateral pelvic sidewall/iliac side chain and right inguinal lymph nodes, compatible with treatment response. 2. Stable scattered tiny pulmonary nodules, nonspecific but favored benign. Suggest continued attention on follow-up imaging 3. No evidence of new or progressive metastatic disease within the chest, abdomen or pelvis. 4. Diffuse hepatic steatosis. 5.  Aortic Atherosclerosis (ICD10-I70.0). Electronically Signed   By: Maudry Mayhew M.D.   On: 02/05/2023 15:03   XR Wrist Complete Right  Result Date: 01/13/2023 Right wrist  3 views: Right wrist distal radius fracture remains unchanged in overall position alignment.  Fracture is healed.  No acute findings otherwise.        Interval History: Patient presents today for interval follow-up for discussion of interval debulking surgery. She has completed 3 cycles of chemo. She reports fatigue as her main symptom. She denies nausea. She has had bone pain during treatment. No longer feels a palpable nodule in groin.    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   Top of Left Hand(Tx p Bx)    Past Surgical History:  Procedure Laterality Date   CARPAL TUNNEL RELEASE     BILAT     CATARACT EXTRACTION     BILAT    ECTOPIC PREGNANCY SURGERY     in the setting of prior BTL, lapartomy   EYE SURGERY     IR IMAGING GUIDED PORT INSERTION  12/02/2022   KNEE CARTILAGE SURGERY     RIGHT    (MENISCUS)   MOHS SURGERY     left nose/inner eye for basal cell   TONSILLECTOMY     TOTAL HIP ARTHROPLASTY Left 12/19/2015   TOTAL HIP ARTHROPLASTY Left 12/19/2015   Procedure: LEFT TOTAL HIP ARTHROPLASTY;  Surgeon: Valeria Batman, MD;  Location: [facility] OR;  Service: Orthopedics;  Laterality: Left;   TRIGGER FINGER RELEASE     TUBAL LIGATION      Family History  Problem Relation Age of Onset   Uterine cancer Mother 4   Heart attack Mother    Hypertension Mother    Kidney cancer Father 7   Lung cancer Father    Diabetes Sister    Thyroid disease Neg Hx    Colon cancer Neg Hx    Breast cancer Neg Hx    Ovarian cancer Neg Hx    Endometrial cancer Neg Hx    Pancreatic cancer Neg Hx    Prostate cancer Neg Hx     Social History   Socioeconomic History   Marital status: Married    Spouse  name: Not on file   Number of children: Not on file   Years of education: Not on file   Highest education level: Not on file  Occupational History   Not on file  Tobacco Use   Smoking status: Never   Smokeless tobacco: Never  Vaping Use   Vaping status: Never Used  Substance and Sexual Activity   Alcohol use: Yes    Alcohol/week: 0.0 standard drinks of alcohol    Comment: Rare   Drug use: No   Sexual activity: Yes    Birth control/protection: Surgical, Post-menopausal    Comment: Tubal lig-1st intercourse 72 yo-Fewer than 5 partners  Other Topics Concern   Not on file  Social History Narrative   Not on file   Social Determinants of Health   Financial Resource Strain: Not on file  Food Insecurity: Low Risk  (12/17/2022)   [facility]   Food vital sign    Within the past 12 months, you worried that your food would run out before you got money to buy more: Never true    Within the past 12 months, the food you bought just didn't last and you didn't have money to get more. : Never true  Transportation Needs: No Transportation Needs (12/17/2022)   [facility]   Transportation    In the past 12 months, has lack of reliable transportation kept you from medical appointments, meetings, work or from getting things needed for daily living? :  No  Physical Activity: Not on file  Stress: Not on file  Social Connections: Not on file    Current Medications:  Current Outpatient Medications:    calcium carbonate (TUMS) 500 MG chewable tablet, Chew 1 tablet by mouth daily., Disp: , Rfl:    dexamethasone (DECADRON) 4 MG tablet, Take 2 tabs at the night before and 2 tab the morning of chemotherapy, every 3 weeks, by mouth x 6 cycles, Disp: 24 tablet, Rfl: 6   diclofenac sodium (VOLTAREN) 1 % GEL, Apply 2-4 g topically 4 (four) times daily. (Patient taking differently: Apply 2-4 g topically 4 (four) times daily as needed (pain).),  Disp: 3 Tube, Rfl: 3   ELIQUIS 5 MG TABS tablet, Take 5 mg by mouth 2 (two) times daily., Disp: , Rfl:    Glucosamine 500 MG CAPS, Take 500 mg by mouth 2 (two) times daily., Disp: , Rfl:    levothyroxine (SYNTHROID) 75 MCG tablet, Take 1 tablet (75 mcg total) by mouth daily., Disp: 90 tablet, Rfl: 3   lidocaine-prilocaine (EMLA) cream, Apply 1 Application topically as needed., Disp: 30 g, Rfl: 3   Multiple Vitamin (MULTIVITAMIN) tablet, Take 1 tablet by mouth daily., Disp: , Rfl:    ondansetron (ZOFRAN) 8 MG tablet, Take 1 tablet (8 mg total) by mouth every 8 (eight) hours as needed for nausea., Disp: 30 tablet, Rfl: 3   prochlorperazine (COMPAZINE) 10 MG tablet, Take 1 tablet (10 mg total) by mouth every 6 (six) hours as needed for nausea or vomiting., Disp: 30 tablet, Rfl: 0   senna-docusate (SENOKOT-S) 8.6-50 MG tablet, Take 2 tablets by mouth at bedtime. For AFTER surgery, do not take if having diarrhea, Disp: 30 tablet, Rfl: 0   acetaminophen (TYLENOL) 500 MG tablet, Take 1,000 mg by mouth every 6 (six) hours as needed for moderate pain., Disp: , Rfl:    Carboxymethylcellulose Sodium (THERATEARS OP), Place 1 drop into both eyes 3 (three) times daily., Disp: , Rfl:    Cholecalciferol (VITAMIN D) 50 MCG (2000 UT) tablet, Take 2,000 Units by mouth daily., Disp: , Rfl:    ibuprofen (ADVIL) 200 MG tablet, Take 400 mg by mouth daily as needed for moderate pain., Disp: , Rfl:    loratadine (CLARITIN) 10 MG tablet, Take 10 mg by mouth See admin instructions. Take 10 mg daily for 5 days starting on the day of chemo. May take 10 mg daily as needed for allergies, Disp: , Rfl:    melatonin 5 MG TABS, Take 5 mg by mouth at bedtime as needed (sleep)., Disp: , Rfl:    oxyCODONE (OXY IR/ROXICODONE) 5 MG immediate release tablet, Take 1 tablet (5 mg total) by mouth every 4 (four) hours as needed for severe pain. For AFTER surgery only, do not take and drive, Disp: 15 tablet, Rfl: 0  Review of  Symptoms: Complete 10-system review is negative except as above in Interval History.  Physical Exam: BP (!) 109/58 (BP Location: Left Arm, Patient Position: Sitting)   Pulse 74   Temp 98.8 F (37.1 C) (Oral)   Resp 16   Ht 5\' 2"  (1.575 m)   Wt 148 lb 8 oz (67.4 kg)   SpO2 100%   BMI 27.16 kg/m  General: Alert, oriented, no acute distress. HEENT: Normocephalic, atraumatic. Neck symmetric without masses. Sclera anicteric.  Chest: Normal work of breathing. Clear to auscultation bilaterally.   Cardiovascular: Regular rate and rhythm, no murmurs. Abdomen: Soft, nontender.  Normoactive bowel sounds.  No masses appreciated.  Extremities: Extremities warm, well perfused. Mild RLE edema. Skin: No rashes or lesions noted. Lymph nodes: No palpable cervical, supraclavicular or inguinal lymphadenopathy   Laboratory & Radiologic Studies: MR Pelvis W Wo Contrast 2022/12/23  Narrative CLINICAL DATA:  Metastatic poorly differentiated carcinoma likely of gyn origin.  EXAM: MRI PELVIS WITHOUT AND WITH CONTRAST  TECHNIQUE: Multiplanar multisequence MR imaging of the pelvis was performed both before and after administration of intravenous contrast.  CONTRAST:  7mL GADAVIST GADOBUTROL 1 MMOL/ML IV SOLN  COMPARISON:  PET-CT 11/16/2022.  Abdomen and pelvis CT 10/25/2022.  FINDINGS: Urinary Tract: Bladder is partially obscured by susceptibility artifact from left hip hardware. No evidence for urethral diverticulum.  Bowel:  Unremarkable visualized pelvic bowel loops.  Vascular/Lymphatic: No pathologically enlarged lymph nodes. No significant vascular abnormality seen. Right pelvic sidewall lymph node seen on CT of 10/25/2022 at 5.6 x 2.8 cm is 5.7 x 2.9 cm on MRI today (postcontrast T1 image 28/19). Upper normal to mildly enlarged right groin lymph nodes again noted including 11 mm short axis right groin node on 52/19. left pelvic sidewall partially obscured by susceptibility  artifact.  Reproductive: Uterus unremarkable without evidence for mass lesion. No appreciable thickening of the endometrium. Low signal intensity fibrous stroma of the cervix appears preserved. Mucosal signal within the cervical canal is prominent. Right ovary not discretely visualized. Left ovary likely visible along the left pelvic sidewall measuring 1.9 x 1.3 x 1.7 cm (axial T2 image 11 of series 5 and sagittal T2 image 22 of series 28). No adnexal mass.Small Bartholin's cyst noted bilaterally.  Other:  No substantial free fluid.  Musculoskeletal: No focal suspicious marrow enhancement within the visualized bony anatomy. Left hip region obscured by susceptibility artifact.  IMPRESSION: 1. No adnexal mass. No uterine mass lesion evident and there is no substantial thickening of the endometrium by MRI. Low signal intensity fibrous stroma of the cervix appears preserved although high signal intensity of the cervical mucosa is somewhat prominent. This could be correlated with Pap smear as clinically warranted. 2. Stable right pelvic sidewall and groin lymphadenopathy. 3. Small Bartholin's cysts bilaterally.   Electronically Signed By: Kennith Center M.D. On: 12/20/2022 12:57

## 2023-02-01 NOTE — Progress Notes (Signed)
Patient here for a pre-operative appointment prior to her scheduled surgery on February 16, 2023. She is scheduled for a robotic assisted total laparoscopic hysterectomy, bilateral salpingo-oophorectomy, possible right pelvic lymph node dissection, possible right inguinofemoral lymph node dissection, mini laparotomy for omentectomy. The surgery was discussed in detail.  See after visit summary for additional details. Visual aids used to discuss items related to surgery including the incentive spirometer, sequential compression stockings, foley catheter, IV pump, multi-modal pain regimen including tylenol, photo of the surgical robot, female reproductive system to discuss surgery in detail.      Discussed post-op pain management in detail including the aspects of the enhanced recovery pathway.  Advised her that a new prescription would be sent in for Oxycodone and it is only to be used for after her upcoming surgery.  We discussed the use of tylenol post-op and to monitor for a maximum of 4,000 mg in a 24 hour period.  Also prescribed sennakot to be used after surgery and to hold if having loose stools.  Discussed bowel regimen in detail.     Discussed the use of SCDs and measures to take at home to prevent DVT including frequent mobility.  Reportable signs and symptoms of DVT discussed. Post-operative instructions discussed and expectations for after surgery. Incisional care discussed as well including reportable signs and symptoms including erythema, drainage, wound separation.     30 minutes spent with the patient.  Verbalizing understanding of material discussed. No needs or concerns voiced at the end of the visit.   Advised patient to call for any needs.  Advised that her post-operative medications had been prescribed and could be picked up at any time.    This appointment is included in the global surgical bundle as pre-operative teaching and has no charge.

## 2023-02-01 NOTE — Patient Instructions (Signed)
Preparing for your Surgery  Plan for surgery on February 16, 2023 with Dr. [name] at [facility]. You will be scheduled for robotic assisted total laparoscopic hysterectomy (removal of the uterus and cervix), bilateral salpingo-oophorectomy (removal of both ovaries and fallopian tubes), possible right pelvic lymph node dissection, possible right inguinofemoral lymph node dissection, mini laparotomy (slightly larger incision for removal of the omentum), for omentectomy.   Pre-operative Testing -You will receive a phone call from presurgical testing at [facility] to arrange for a pre-operative appointment and lab work.  -Bring your insurance card, copy of an advanced directive if applicable, medication list  -At that visit, you will be asked to sign a consent for a possible blood transfusion in case a transfusion becomes necessary during surgery.  The need for a blood transfusion is rare but having consent is a necessary part of your care.     -PLAN ON HOLDING YOUR ELIQUIS TWO DAY BEFORE SURGERY AND THE DAY OF SURGERY. MOST LIKELY, YOU WILL BE ABLE TO RESUME THIS 2 DAYS AFTER SURGERY AT YOUR TYPICAL DOSE.  -Do not take supplements such as fish oil (omega 3), red yeast rice, turmeric before your surgery. You want to avoid medications with aspirin in them including headache powders such as BC or Goody's), Excedrin migraine.  Day Before Surgery at Home -You will be asked to take in a light diet the day before surgery. You will be advised you can have clear liquids up until 3 hours before your surgery.    Eat a light diet the day before surgery.  Examples including soups, broths, toast, yogurt, mashed potatoes.  AVOID GAS PRODUCING FOODS AND BEVERAGES. Things to avoid include carbonated beverages (fizzy beverages, sodas), raw fruits and raw vegetables (uncooked), or beans.   If your bowels are filled with gas, your surgeon will have difficulty visualizing your pelvic organs  which increases your surgical risks.  Your role in recovery Your role is to become active as soon as directed by your doctor, while still giving yourself time to heal.  Rest when you feel tired. You will be asked to do the following in order to speed your recovery:  - Cough and breathe deeply. This helps to clear and expand your lungs and can prevent pneumonia after surgery.  - STAY ACTIVE WHEN YOU GET HOME. Do mild physical activity. Walking or moving your legs help your circulation and body functions return to normal. Do not try to get up or walk alone the first time after surgery.   -If you develop swelling on one leg or the other, pain in the back of your leg, redness/warmth in one of your legs, please call the office or go to the Emergency Room to have a doppler to rule out a blood clot. For shortness of breath, chest pain-seek care in the Emergency Room as soon as possible. - Actively manage your pain. Managing your pain lets you move in comfort. We will ask you to rate your pain on a scale of zero to 10. It is your responsibility to tell your doctor or nurse where and how much you hurt so your pain can be treated.  Special Considerations -If you are diabetic, you may be placed on insulin after surgery to have closer control over your blood sugars to promote healing and recovery.  This does not mean that you will be discharged on insulin.  If applicable, your oral antidiabetics will be resumed when you are tolerating a solid diet.  -  Your final pathology results from surgery should be available around one week after surgery and the results will be relayed to you when available.  -Dr. Antionette Char is the surgeon that assists your GYN Oncologist with surgery.  If you end up staying the night, the next day after your surgery you will either see Dr. Pricilla Holm, Dr. Alvester Morin, or Dr. Antionette Char.  -FMLA forms can be faxed to [phone] and please allow 5-7 business days for  completion.  Pain Management After Surgery -You have been prescribed your pain medication and bowel regimen medications before surgery so that you can have these available when you are discharged from the hospital. The pain medication is for use ONLY AFTER surgery and a new prescription will not be given.   -Make sure that you have Tylenol IF YOU ARE ABLE TO TAKE THESE MEDICATION at home to use on a regular basis after surgery for pain control.   -Review the attached handout on narcotic use and their risks and side effects.   Bowel Regimen -You have been prescribed Sennakot-S to take nightly to prevent constipation especially if you are taking the narcotic pain medication intermittently.  It is important to prevent constipation and drink adequate amounts of liquids. You can stop taking this medication when you are not taking pain medication and you are back on your normal bowel routine.  Risks of Surgery Risks of surgery are low but include bleeding, infection, damage to surrounding structures, re-operation, blood clots, and very rarely death.   Blood Transfusion Information (For the consent to be signed before surgery)  We will be checking your blood type before surgery so in case of emergencies, we will know what type of blood you would need.                                            WHAT IS A BLOOD TRANSFUSION?  A transfusion is the replacement of blood or some of its parts. Blood is made up of multiple cells which provide different functions. Red blood cells carry oxygen and are used for blood loss replacement. White blood cells fight against infection. Platelets control bleeding. Plasma helps clot blood. Other blood products are available for specialized needs, such as hemophilia or other clotting disorders. BEFORE THE TRANSFUSION  Who gives blood for transfusions?  You may be able to donate blood to be used at a later date on yourself (autologous donation). Relatives can be  asked to donate blood. This is generally not any safer than if you have received blood from a stranger. The same precautions are taken to ensure safety when a relative's blood is donated. Healthy volunteers who are fully evaluated to make sure their blood is safe. This is blood bank blood. Transfusion therapy is the safest it has ever been in the practice of medicine. Before blood is taken from a donor, a complete history is taken to make sure that person has no history of diseases nor engages in risky social behavior (examples are intravenous drug use or sexual activity with multiple partners). The donor's travel history is screened to minimize risk of transmitting infections, such as malaria. The donated blood is tested for signs of infectious diseases, such as HIV and hepatitis. The blood is then tested to be sure it is compatible with you in order to minimize the chance of a transfusion reaction. If you or  a relative donates blood, this is often done in anticipation of surgery and is not appropriate for emergency situations. It takes many days to process the donated blood. RISKS AND COMPLICATIONS Although transfusion therapy is very safe and saves many lives, the main dangers of transfusion include:  Getting an infectious disease. Developing a transfusion reaction. This is an allergic reaction to something in the blood you were given. Every precaution is taken to prevent this. The decision to have a blood transfusion has been considered carefully by your caregiver before blood is given. Blood is not given unless the benefits outweigh the risks.  AFTER SURGERY INSTRUCTIONS  Return to work: 4-6 weeks if applicable  You may have a white honeycomb dressing over your larger incision. This dressing can be removed 5 days after surgery and you do not need to reapply a new dressing. Once you remove the dressing, you will notice that you have the surgical glue (dermabond) on the incision and this will peel  off on its own. You can get this dressing wet in the shower the days after surgery prior to removal on the 5th day.   Activity: 1. Be up and out of the bed during the day.  Take a nap if needed.  You may walk up steps but be careful and use the hand rail.  Stair climbing will tire you more than you think, you may need to stop part way and rest.   2. No lifting or straining for 6 weeks over 10 pounds. No pushing, pulling, straining for 6 weeks.  3. No driving for around 1 week(s).  Do not drive if you are taking narcotic pain medicine and make sure that your reaction time has returned.   4. You can shower as soon as the next day after surgery. Shower daily.  Use your regular soap and water (not directly on the incision) and pat your incision(s) dry afterwards; don't rub.  No tub baths or submerging your body in water until cleared by your surgeon. If you have the soap that was given to you by pre-surgical testing that was used before surgery, you do not need to use it afterwards because this can irritate your incisions.   5. No sexual activity and nothing in the vagina for 12 weeks.  6. You may experience a small amount of clear drainage from your incisions, which is normal.  If the drainage persists, increases, or changes color please call the office.  7. Do not use creams, lotions, or ointments such as neosporin on your incisions after surgery until advised by your surgeon because they can cause removal of the dermabond glue on your incisions.    8. You may experience vaginal spotting after surgery or when the stitches at the top of the vagina begin to dissolve.  The spotting is normal but if you experience heavy bleeding, call our office.  9. Take Tylenol first for pain if you are able to take these medication and only use narcotic pain medication for severe pain not relieved by the Tylenol.  Monitor your Tylenol intake to a max of 4,000 mg in a 24 hour period.  Diet: 1. Low sodium Heart  Healthy Diet is recommended but you are cleared to resume your normal (before surgery) diet after your procedure.  2. It is safe to use a laxative, such as Miralax or Colace, if you have difficulty moving your bowels. You have been prescribed Sennakot-S to take at bedtime every evening after surgery to keep  bowel movements regular and to prevent constipation.    Wound Care: 1. Keep clean and dry.  Shower daily.  Reasons to call the Doctor: Fever - Oral temperature greater than 100.4 degrees Fahrenheit Foul-smelling vaginal discharge Difficulty urinating Nausea and vomiting Increased pain at the site of the incision that is unrelieved with pain medicine. Difficulty breathing with or without chest pain New calf pain especially if only on one side Sudden, continuing increased vaginal bleeding with or without clots.   Contacts: For questions or concerns you should contact:  Dr. [name] at [phone]  Warner Mccreedy, NP at [phone]  After Hours: call [phone] and have the GYN Oncologist paged/contacted (after 5 pm or on the weekends). You will speak with an after hours RN and let he or she know you have had surgery.  Messages sent via mychart are for non-urgent matters and are not responded to after hours so for urgent needs, please call the after hours number.

## 2023-02-01 NOTE — H&P (View-Only) (Signed)
 Gynecologic Oncology Return Clinic Visit  Date of Service: 02/01/2023 Referring Provider: [name], MD   Assessment & Plan: [name] is a 72 y.o. 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  Reviewed patient's prior imaging in detail. Main findings previously included enlarged right inguinofemoral lymph nodes, pelvic lymph nodes and retrocaval lymph node, the largest being a right external iliac lymph node.   Based on exam, her previously palpable right groin lymphadenopathy is no longer palpable, so this is an optimistic sign for response to treatment. She has interval imaging scheduled for 02/05/23.   Assuming appropriate response on imaging, we will proceed with interval debulking surgery. We discussed that this would include a robotic assisted total laparoscopic hysterectomy, bilateral salpingo-oopohorectomy, pelvic lymph node dissection if residual enlarged lymph nodes (likely), possible inguinofemoral lymph node dissection if residual enlarged lymph nodes, and mini-laparotomy for omentectomy. If there was any other evidence of disease in the abdomen or pelvis, we would attempt to resect this as well.   Patient was consented for: Robotic assisted total laparoscopic hysterectomy, bilateral salpingo-oophorectomy, pelvic lymph node dissection, possible inguinofemoral lymph node dissection, minilaparotomy for omentectomy, debulking on 02/16/23.  The risks of surgery were discussed in detail and she understands these to including but not limited to bleeding requiring a blood transfusion, infection, injury to adjacent organs (including but not limited to the bowels, bladder, ureters, nerves, blood vessels), thromboembolic events, wound separation, hernia, vaginal cuff separation, possible  risk of lymphedema and lymphocyst if lymphadenectomy performed, unforseen complication, and possible need for re-exploration.  If the patient experiences any of these events, she understands that her hospitalization or recovery may be prolonged and that she may need to take additional medications for a prolonged period. The patient will receive DVT and antibiotic prophylaxis as indicated. She voiced a clear understanding. She had the opportunity to ask questions and informed consent was obtained today. She wishes to proceed.  She will hold her eliquis 2 days prior to surgery and on day of surgery.  She does not require preoperative clearance. Her METs are >4.  All preoperative instructions were reviewed. Postoperative expectations were also reviewed. Written handouts were provided to the patient.  We will also plan referral to lymphedema clinic given concern for ongoing right lower extremity edema. Previously thought in part to be due to DVT, but may also be due to lymphadenopathy and anticipate at least lymph node sampling for debulking during surgery.    RTC postop  [name], MD Gynecologic Oncology   Medical Decision Making I personally spent  TOTAL 45 minutes face-to-face and non-face-to-face in the care of this patient, which includes all pre, intra, and post visit time on the date of service.   ----------------------- Reason for Visit: Follow-up  Treatment History: Oncology History Overview Note  Er neg, MSI stable, PD-L1 80% No genetic testing done, did not meet criteria   Uterine cancer (HCC)  10/23/2022 Imaging   1. 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 2. Multiple prominent palpable lymph nodes in the right groin measuring up to 2.1 cm, nonspecific. 3. Small right popliteal fossa Baker's cyst.   10/25/2022 Imaging   Ct imaging 1. Pelvic lymphadenopathy with the most prominent lymph node along the right iliac chain  measuring 5.6 x 2.8 cm. Metastatic lymphadenopathy or lymphoproliferative disease  cannot be excluded.  2. 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 3. Prominent right inguinal lymph nodes. 4. 8 mm sclerotic bone lesion within the right ischium, indeterminate. 5. Aortic atherosclerosis.   Aortic Atherosclerosis (ICD10-I70.0).       11/11/2022 Pathology Results   SURGICAL PATHOLOGY CASE: [id number] PATIENT: [name] Surgical Pathology Report  Specimen Submitted: A. Lymph node, right pelvic  Clinical History: Pelvic lymphadenopathy  DIAGNOSIS: A. LYMPH NODE, RIGHT PELVIC; CT-GUIDED CORE NEEDLE BIOPSY: - METASTATIC POORLY DIFFERENTIATED CARCINOMA, SEE COMMENT.  Comment: Immunohistochemical studies show tumor cells to be positive for CK7, MOC-31, GATA-3, CK5/6, and calretinin (subset). P16 is strongly and diffusely positive. P53 demonstrates an absence of staining within tumor cells, indicative of a null staining pattern. PAX-8, WT-1, D240, TTF-1, cdx-2, and p40 are negative. The pattern of immunohistochemical staining is non-specific. Based on the morphologic features and pattern of immunohistochemical staining the differential diagnosis includes metastatic urothelial carcinoma, metastatic breast carcinoma, and metastatic serous carcinoma of gyn origin. Correlation with radiographic findings is required.  There is sufficient tissue present for ancillary molecular testing.  IHC slides were prepared by [facility], [place]. All controls stained appropriately.   11/16/2022 PET scan   1. Examination is positive for tracer avid adenopathy within the retroperitoneum, bilateral pelvis and right inguinal region. Findings are compatible with nodal metastasis. Primary neoplasm is not  apparent on the current exam.  2. No signs of solid organ metastasis within the abdomen or pelvis. 3. No signs of tracer avid disease above the diaphragm. 4. Asymmetric subcutaneous edema is identified involving the visualized portions of the right lower extremity. 5. 5 mm perifissural nodule in the left mid lung is too small to characterize by PET-CT. 6. Increased uptake within both lobes of thyroid gland, right greater than left. Correlation with thyroid function tests advised. 7.  Aortic Atherosclerosis (ICD10-I70.0).   11/19/2022 Initial Diagnosis   Gynecologic malignancy (HCC)   11/20/2022 Cancer Staging   Staging form: Corpus Uteri - Carcinoma and Carcinosarcoma, AJCC 8th Edition - Clinical stage from 11/20/2022: FIGO Stage IVB (cTX, cN2a, pM1) - Signed by Artis Delay, MD on 11/20/2022 Stage prefix: Initial diagnosis   12/02/2022 Procedure   Ultrasound and fluoroscopically guided right internal jugular single lumen power port catheter insertion. Tip in the SVC/RA junction. Catheter ready for use.   12/04/2022 - 12/04/2022 Chemotherapy   Patient is on Treatment Plan : UTERINE Carboplatin AUC 6 + Paclitaxel q21d     12/04/2022 -  Chemotherapy   Patient is on Treatment Plan : UTERINE Pembrolizumab (200), Paclitaxel (175), Carboplatin (5) q21d x 6 cycles / Pembrolizumab (400) q42d     12/15/2022 Imaging   1. No adnexal mass. No uterine mass lesion evident and there is no substantial thickening of the endometrium by MRI. Low signal intensity fibrous stroma of the cervix appears preserved although high signal intensity of the cervical mucosa is somewhat prominent. This could be correlated with Pap smear as clinically warranted. 2. Stable right pelvic sidewall and groin lymphadenopathy. 3. Small Bartholin's cysts bilaterally.   02/05/2023 Imaging   CT CHEST ABDOMEN PELVIS W CONTRAST  Result Date: 02/05/2023 CLINICAL DATA:  History of endometrial cancer, monitor. High-risk. * Tracking Code: BO *  EXAM: CT CHEST, ABDOMEN, AND PELVIS WITH CONTRAST TECHNIQUE: Multidetector CT imaging of the chest, abdomen and  pelvis was performed following the standard protocol during bolus administration of intravenous contrast. RADIATION DOSE REDUCTION: This exam was performed according to the departmental dose-optimization program which includes automated exposure control, adjustment of the mA and/or kV according to patient size and/or use of iterative reconstruction technique. CONTRAST:  OMNIPAQUE IOHEXOL 300 MG/ML  SOLN COMPARISON:  Multiple priors including MRI pelvis December 15, 2022 PET-CT Nov 16, 2022 and CT abdomen pelvis October 25, 2022. FINDINGS: CT CHEST FINDINGS Cardiovascular: Right chest wall Port-A-Cath with the tip in the right atrium. Aortic atherosclerosis. Normal caliber thoracic aorta. No central pulmonary embolus on this nondedicated study. Normal size heart. No significant pericardial effusion/thickening Mediastinum/Nodes: No suspicious thyroid nodule. No pathologically enlarged mediastinal, hilar or axillary lymph nodes the esophagus is grossly unremarkable. Lungs/Pleura: Stable scattered tiny pulmonary nodules. For instance: -2 mm right upper lobe pulmonary nodule on image 29/3, unchanged -2 mm pulmonary nodule along the right major fissure on image 73/3, unchanged -4 mm pulmonary nodule along the left major fissure on image 73/3, unchanged. No pleural effusion.  No pneumothorax. Musculoskeletal: No suspicious chest wall lesion. No aggressive lytic or blastic lesion of bone. CT ABDOMEN PELVIS FINDINGS Hepatobiliary: Diffuse hepatic steatosis. Gallbladder is unremarkable. No biliary ductal dilation Pancreas: No pancreatic ductal dilation or evidence of acute inflammation Spleen: No splenomegaly. Adrenals/Urinary Tract: Bilateral adrenal glands appear normal. No hydronephrosis. Kidneys demonstrate symmetric enhancement. No suspicious renal mass. Urinary bladder is not well evaluated due to  underdistention and streak artifact from left hip arthroplasty. Stomach/Bowel: No radiopaque enteric contrast material was administered. Hyperdensity along the posterior aspect of the stomach on image 53/2 appears similar on delayed imaging and is compatible with ingested material. No pathologic dilation of large or small bowel. No evidence of acute bowel inflammation. Vascular/Lymphatic: Normal caliber abdominal aorta. Aortic atherosclerosis. The portal, splenic and superior mesenteric veins are patent. Decreased size of the retroperitoneal, bilateral pelvic sidewall/iliac side chain and right inguinal lymph nodes. For reference: -Retrocaval lymph node measures 6 mm in short axis on image 69/2 previously 11 mm -Right sidewall lymph node measures 1.5 cm in short axis on image 110/2 previously 2.9 cm Reproductive: Evaluation of the pelvic reproductive structures is limited by streak artifact from left hip arthroplasty. Other: No significant abdominopelvic free fluid. No discrete peritoneal or omental nodularity. Musculoskeletal: No aggressive lytic or blastic lesion of bone. Multilevel degenerative changes spine. Left total hip arthroplasty. Degenerative change of the right hip. IMPRESSION: 1. Decreased size of the retroperitoneal, bilateral pelvic sidewall/iliac side chain and right inguinal lymph nodes, compatible with treatment response. 2. Stable scattered tiny pulmonary nodules, nonspecific but favored benign. Suggest continued attention on follow-up imaging 3. No evidence of new or progressive metastatic disease within the chest, abdomen or pelvis. 4. Diffuse hepatic steatosis. 5.  Aortic Atherosclerosis (ICD10-I70.0). Electronically Signed   By: Maudry Mayhew M.D.   On: 02/05/2023 15:03   XR Wrist Complete Right  Result Date: 01/13/2023 Right wrist  3 views: Right wrist distal radius fracture remains unchanged in overall position alignment.  Fracture is healed.  No acute findings otherwise.        Interval History: Patient presents today for interval follow-up for discussion of interval debulking surgery. She has completed 3 cycles of chemo. She reports fatigue as her main symptom. She denies nausea. She has had bone pain during treatment. No longer feels a palpable nodule in groin.    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   Top of Left Hand(Tx p Bx)    Past Surgical History:  Procedure Laterality Date   CARPAL TUNNEL RELEASE     BILAT     CATARACT EXTRACTION     BILAT    ECTOPIC PREGNANCY SURGERY     in the setting of prior BTL, lapartomy   EYE SURGERY     IR IMAGING GUIDED PORT INSERTION  12/02/2022   KNEE CARTILAGE SURGERY     RIGHT    (MENISCUS)   MOHS SURGERY     left nose/inner eye for basal cell   TONSILLECTOMY     TOTAL HIP ARTHROPLASTY Left 12/19/2015   TOTAL HIP ARTHROPLASTY Left 12/19/2015   Procedure: LEFT TOTAL HIP ARTHROPLASTY;  Surgeon: Valeria Batman, MD;  Location: [facility] OR;  Service: Orthopedics;  Laterality: Left;   TRIGGER FINGER RELEASE     TUBAL LIGATION      Family History  Problem Relation Age of Onset   Uterine cancer Mother 4   Heart attack Mother    Hypertension Mother    Kidney cancer Father 7   Lung cancer Father    Diabetes Sister    Thyroid disease Neg Hx    Colon cancer Neg Hx    Breast cancer Neg Hx    Ovarian cancer Neg Hx    Endometrial cancer Neg Hx    Pancreatic cancer Neg Hx    Prostate cancer Neg Hx     Social History   Socioeconomic History   Marital status: Married    Spouse  name: Not on file   Number of children: Not on file   Years of education: Not on file   Highest education level: Not on file  Occupational History   Not on file  Tobacco Use   Smoking status: Never   Smokeless tobacco: Never  Vaping Use   Vaping status: Never Used  Substance and Sexual Activity   Alcohol use: Yes    Alcohol/week: 0.0 standard drinks of alcohol    Comment: Rare   Drug use: No   Sexual activity: Yes    Birth control/protection: Surgical, Post-menopausal    Comment: Tubal lig-1st intercourse 72 yo-Fewer than 5 partners  Other Topics Concern   Not on file  Social History Narrative   Not on file   Social Determinants of Health   Financial Resource Strain: Not on file  Food Insecurity: Low Risk  (12/17/2022)   [facility]   Food vital sign    Within the past 12 months, you worried that your food would run out before you got money to buy more: Never true    Within the past 12 months, the food you bought just didn't last and you didn't have money to get more. : Never true  Transportation Needs: No Transportation Needs (12/17/2022)   [facility]   Transportation    In the past 12 months, has lack of reliable transportation kept you from medical appointments, meetings, work or from getting things needed for daily living? :  No  Physical Activity: Not on file  Stress: Not on file  Social Connections: Not on file    Current Medications:  Current Outpatient Medications:    calcium carbonate (TUMS) 500 MG chewable tablet, Chew 1 tablet by mouth daily., Disp: , Rfl:    dexamethasone (DECADRON) 4 MG tablet, Take 2 tabs at the night before and 2 tab the morning of chemotherapy, every 3 weeks, by mouth x 6 cycles, Disp: 24 tablet, Rfl: 6   diclofenac sodium (VOLTAREN) 1 % GEL, Apply 2-4 g topically 4 (four) times daily. (Patient taking differently: Apply 2-4 g topically 4 (four) times daily as needed (pain).),  Disp: 3 Tube, Rfl: 3   ELIQUIS 5 MG TABS tablet, Take 5 mg by mouth 2 (two) times daily., Disp: , Rfl:    Glucosamine 500 MG CAPS, Take 500 mg by mouth 2 (two) times daily., Disp: , Rfl:    levothyroxine (SYNTHROID) 75 MCG tablet, Take 1 tablet (75 mcg total) by mouth daily., Disp: 90 tablet, Rfl: 3   lidocaine-prilocaine (EMLA) cream, Apply 1 Application topically as needed., Disp: 30 g, Rfl: 3   Multiple Vitamin (MULTIVITAMIN) tablet, Take 1 tablet by mouth daily., Disp: , Rfl:    ondansetron (ZOFRAN) 8 MG tablet, Take 1 tablet (8 mg total) by mouth every 8 (eight) hours as needed for nausea., Disp: 30 tablet, Rfl: 3   prochlorperazine (COMPAZINE) 10 MG tablet, Take 1 tablet (10 mg total) by mouth every 6 (six) hours as needed for nausea or vomiting., Disp: 30 tablet, Rfl: 0   senna-docusate (SENOKOT-S) 8.6-50 MG tablet, Take 2 tablets by mouth at bedtime. For AFTER surgery, do not take if having diarrhea, Disp: 30 tablet, Rfl: 0   acetaminophen (TYLENOL) 500 MG tablet, Take 1,000 mg by mouth every 6 (six) hours as needed for moderate pain., Disp: , Rfl:    Carboxymethylcellulose Sodium (THERATEARS OP), Place 1 drop into both eyes 3 (three) times daily., Disp: , Rfl:    Cholecalciferol (VITAMIN D) 50 MCG (2000 UT) tablet, Take 2,000 Units by mouth daily., Disp: , Rfl:    ibuprofen (ADVIL) 200 MG tablet, Take 400 mg by mouth daily as needed for moderate pain., Disp: , Rfl:    loratadine (CLARITIN) 10 MG tablet, Take 10 mg by mouth See admin instructions. Take 10 mg daily for 5 days starting on the day of chemo. May take 10 mg daily as needed for allergies, Disp: , Rfl:    melatonin 5 MG TABS, Take 5 mg by mouth at bedtime as needed (sleep)., Disp: , Rfl:    oxyCODONE (OXY IR/ROXICODONE) 5 MG immediate release tablet, Take 1 tablet (5 mg total) by mouth every 4 (four) hours as needed for severe pain. For AFTER surgery only, do not take and drive, Disp: 15 tablet, Rfl: 0  Review of  Symptoms: Complete 10-system review is negative except as above in Interval History.  Physical Exam: BP (!) 109/58 (BP Location: Left Arm, Patient Position: Sitting)   Pulse 74   Temp 98.8 F (37.1 C) (Oral)   Resp 16   Ht 5\' 2"  (1.575 m)   Wt 148 lb 8 oz (67.4 kg)   SpO2 100%   BMI 27.16 kg/m  General: Alert, oriented, no acute distress. HEENT: Normocephalic, atraumatic. Neck symmetric without masses. Sclera anicteric.  Chest: Normal work of breathing. Clear to auscultation bilaterally.   Cardiovascular: Regular rate and rhythm, no murmurs. Abdomen: Soft, nontender.  Normoactive bowel sounds.  No masses appreciated.  Extremities: Extremities warm, well perfused. Mild RLE edema. Skin: No rashes or lesions noted. Lymph nodes: No palpable cervical, supraclavicular or inguinal lymphadenopathy   Laboratory & Radiologic Studies: MR Pelvis W Wo Contrast 2022/12/23  Narrative CLINICAL DATA:  Metastatic poorly differentiated carcinoma likely of gyn origin.  EXAM: MRI PELVIS WITHOUT AND WITH CONTRAST  TECHNIQUE: Multiplanar multisequence MR imaging of the pelvis was performed both before and after administration of intravenous contrast.  CONTRAST:  7mL GADAVIST GADOBUTROL 1 MMOL/ML IV SOLN  COMPARISON:  PET-CT 11/16/2022.  Abdomen and pelvis CT 10/25/2022.  FINDINGS: Urinary Tract: Bladder is partially obscured by susceptibility artifact from left hip hardware. No evidence for urethral diverticulum.  Bowel:  Unremarkable visualized pelvic bowel loops.  Vascular/Lymphatic: No pathologically enlarged lymph nodes. No significant vascular abnormality seen. Right pelvic sidewall lymph node seen on CT of 10/25/2022 at 5.6 x 2.8 cm is 5.7 x 2.9 cm on MRI today (postcontrast T1 image 28/19). Upper normal to mildly enlarged right groin lymph nodes again noted including 11 mm short axis right groin node on 52/19. left pelvic sidewall partially obscured by susceptibility  artifact.  Reproductive: Uterus unremarkable without evidence for mass lesion. No appreciable thickening of the endometrium. Low signal intensity fibrous stroma of the cervix appears preserved. Mucosal signal within the cervical canal is prominent. Right ovary not discretely visualized. Left ovary likely visible along the left pelvic sidewall measuring 1.9 x 1.3 x 1.7 cm (axial T2 image 11 of series 5 and sagittal T2 image 22 of series 28). No adnexal mass.Small Bartholin's cyst noted bilaterally.  Other:  No substantial free fluid.  Musculoskeletal: No focal suspicious marrow enhancement within the visualized bony anatomy. Left hip region obscured by susceptibility artifact.  IMPRESSION: 1. No adnexal mass. No uterine mass lesion evident and there is no substantial thickening of the endometrium by MRI. Low signal intensity fibrous stroma of the cervix appears preserved although high signal intensity of the cervical mucosa is somewhat prominent. This could be correlated with Pap smear as clinically warranted. 2. Stable right pelvic sidewall and groin lymphadenopathy. 3. Small Bartholin's cysts bilaterally.   Electronically Signed By: Kennith Center M.D. On: 12/20/2022 12:57

## 2023-02-05 ENCOUNTER — Ambulatory Visit (HOSPITAL_BASED_OUTPATIENT_CLINIC_OR_DEPARTMENT_OTHER)
Admission: RE | Admit: 2023-02-05 | Discharge: 2023-02-05 | Disposition: A | Discharge status: Home / Self Care | Payer: Medicare PPO | Source: Ambulatory Visit | Attending: Hematology and Oncology | Admitting: Hematology and Oncology

## 2023-02-05 DIAGNOSIS — C55 Malignant neoplasm of uterus, part unspecified: Secondary | ICD-10-CM | POA: Insufficient documentation

## 2023-02-05 MED ORDER — IOHEXOL 300 MG/ML  SOLN
100.0000 mL | Freq: Once | INTRAMUSCULAR | Status: AC | PRN
  Administered 2023-02-05: 100 mL via INTRAVENOUS

## 2023-02-08 ENCOUNTER — Other Ambulatory Visit: Payer: Self-pay

## 2023-02-08 ENCOUNTER — Other Ambulatory Visit: Payer: Self-pay | Admitting: Oncology

## 2023-02-08 ENCOUNTER — Encounter: Payer: Self-pay | Admitting: Oncology

## 2023-02-08 ENCOUNTER — Other Ambulatory Visit: Payer: Self-pay | Admitting: Hematology and Oncology

## 2023-02-08 ENCOUNTER — Encounter: Payer: Self-pay | Admitting: Hematology and Oncology

## 2023-02-08 ENCOUNTER — Inpatient Hospital Stay: Payer: Medicare PPO | Admitting: Hematology and Oncology

## 2023-02-08 ENCOUNTER — Encounter: Payer: Self-pay | Admitting: Psychiatry

## 2023-02-08 ENCOUNTER — Telehealth: Payer: Self-pay | Admitting: Oncology

## 2023-02-08 VITALS — BP 116/78 | HR 78 | Temp 98.8°F | Resp 18 | Ht 62.0 in | Wt 151.6 lb

## 2023-02-08 DIAGNOSIS — C55 Malignant neoplasm of uterus, part unspecified: Secondary | ICD-10-CM | POA: Diagnosis not present

## 2023-02-08 DIAGNOSIS — I82411 Acute embolism and thrombosis of right femoral vein: Secondary | ICD-10-CM | POA: Diagnosis not present

## 2023-02-08 DIAGNOSIS — C801 Malignant (primary) neoplasm, unspecified: Secondary | ICD-10-CM | POA: Diagnosis not present

## 2023-02-08 DIAGNOSIS — C549 Malignant neoplasm of corpus uteri, unspecified: Secondary | ICD-10-CM

## 2023-02-08 NOTE — Telephone Encounter (Signed)
Left a message advised that Dr. Bertis Ruddy would like to see her today at 2:45 to discuss her good CT results.  Also advised we are canceling her chemotherapy appointment for tomorrow.  Requested a return call to confirm.

## 2023-02-08 NOTE — Telephone Encounter (Signed)
[place] called back and appointment was scheduled with Dr. Bertis Ruddy today at 2:45.  [place] also wanted to let Dr. Bertis Ruddy know that at her last visit with Dr. Alvester Morin, she mentioned that she wanted to make sure the pelvic lymph node was far enough away from the vein to have surgery safely.  She said that another round of chemo may make it less risky depending on the CT results.  Advised Dr. Bertis Ruddy will be notified and will review the CT results with her today.

## 2023-02-08 NOTE — Telephone Encounter (Signed)
I am not a surgeon, I cannot decide for Dr. Alvester Morin what is acceptable and what is not I have canceled her chemo appt for tomorrow since she is all scheduled for surgery

## 2023-02-08 NOTE — Progress Notes (Signed)
Referral placed to [facility] PT for the lymphedema clinic.

## 2023-02-08 NOTE — Progress Notes (Signed)
Called [facility] reading room and requested measurements of the right groin lymph nodes on CT from 02/05/23 per Dr. Alvester Morin.

## 2023-02-09 ENCOUNTER — Inpatient Hospital Stay: Payer: Medicare PPO | Admitting: Hematology and Oncology

## 2023-02-09 ENCOUNTER — Inpatient Hospital Stay: Payer: Medicare PPO

## 2023-02-09 ENCOUNTER — Encounter: Payer: Self-pay | Admitting: Hematology and Oncology

## 2023-02-09 NOTE — Progress Notes (Signed)
[facility] OFFICE PROGRESS NOTE  Patient Care Team: [name], MD as PCP - General (Family Medicine)  ASSESSMENT & PLAN:  Uterine cancer [facility]) I have reviewed multiple imaging studies with the patient and her husband She has remarkable response to therapy The only remaining lymph node in the right pelvic sidewall is 1.5 cm, the rest of them are less than 1 cm. She will proceed with surgery as scheduled with GYN surgeon next week I will review the pathology at the end of the month If she has successful laparoscopic procedure, she can resume treatment as early as 3 to 4 weeks after surgery However, if she ended up with a larger incision, he may be another month before she can resume treatment We discussed importance of therapeutic and diagnostic aspect of her surgery  Acute deep vein thrombosis (DVT) of right lower extremity (HCC) She is doing well with anticoagulation therapy The patient is given verbal instruction to stop anticoagulation for 48 hours prior to surgery If she has no signs of excessive bleeding, she can resume the day 48 hours after surgery  No orders of the defined types were placed in this encounter.   All questions were answered. The patient knows to call the clinic with any problems, questions or concerns. The total time spent in the appointment was 40 minutes encounter with patients including review of chart and various tests results, discussions about plan of care and coordination of care plan   Artis Delay, MD 02/09/2023 10:20 AM  INTERVAL HISTORY: Please see below for problem oriented charting. she returns for treatment follow-up with her husband We discussed CT imaging findings I reviewed imaging with the patient She denies recent bleeding She has some recent fatigue We also discussed perioperative anticoagulation management  REVIEW OF SYSTEMS:   Constitutional: Denies fevers, chills or abnormal weight loss Eyes: Denies blurriness of  vision Ears, nose, mouth, throat, and face: Denies mucositis or sore throat Respiratory: Denies cough, dyspnea or wheezes Cardiovascular: Denies palpitation, chest discomfort or lower extremity swelling Gastrointestinal:  Denies nausea, heartburn or change in bowel habits Skin: Denies abnormal skin rashes Lymphatics: Denies new lymphadenopathy or easy bruising Neurological:Denies numbness, tingling or new weaknesses Behavioral/Psych: Mood is stable, no new changes  All other systems were reviewed with the patient and are negative.  I have reviewed the past medical history, past surgical history, social history and family history with the patient and they are unchanged from previous note.  ALLERGIES:  is allergic to sulfa antibiotics.  MEDICATIONS:  Current Outpatient Medications  Medication Sig Dispense Refill   acetaminophen (TYLENOL) 500 MG tablet Take 1,000 mg by mouth every 6 (six) hours as needed for moderate pain.     calcium carbonate (TUMS) 500 MG chewable tablet Chew 1 tablet by mouth daily.     Carboxymethylcellulose Sodium (THERATEARS OP) Place 1 drop into both eyes 3 (three) times daily.     Cholecalciferol (VITAMIN D) 50 MCG (2000 UT) tablet Take 2,000 Units by mouth daily.     dexamethasone (DECADRON) 4 MG tablet Take 2 tabs at the night before and 2 tab the morning of chemotherapy, every 3 weeks, by mouth x 6 cycles 24 tablet 6   diclofenac sodium (VOLTAREN) 1 % GEL Apply 2-4 g topically 4 (four) times daily. (Patient taking differently: Apply 2-4 g topically 4 (four) times daily as needed (pain).) 3 Tube 3   ELIQUIS 5 MG TABS tablet Take 5 mg by mouth 2 (two) times daily.  Glucosamine 500 MG CAPS Take 500 mg by mouth 2 (two) times daily.     ibuprofen (ADVIL) 200 MG tablet Take 400 mg by mouth daily as needed for moderate pain.     levothyroxine (SYNTHROID) 75 MCG tablet Take 1 tablet (75 mcg total) by mouth daily. 90 tablet 3   lidocaine-prilocaine (EMLA) cream Apply 1  Application topically as needed. 30 g 3   loratadine (CLARITIN) 10 MG tablet Take 10 mg by mouth See admin instructions. Take 10 mg daily for 5 days starting on the day of chemo. May take 10 mg daily as needed for allergies     melatonin 5 MG TABS Take 5 mg by mouth at bedtime as needed (sleep).     Multiple Vitamin (MULTIVITAMIN) tablet Take 1 tablet by mouth daily.     ondansetron (ZOFRAN) 8 MG tablet Take 1 tablet (8 mg total) by mouth every 8 (eight) hours as needed for nausea. 30 tablet 3   oxyCODONE (OXY IR/ROXICODONE) 5 MG immediate release tablet Take 1 tablet (5 mg total) by mouth every 4 (four) hours as needed for severe pain. For AFTER surgery only, do not take and drive 15 tablet 0   prochlorperazine (COMPAZINE) 10 MG tablet Take 1 tablet (10 mg total) by mouth every 6 (six) hours as needed for nausea or vomiting. 30 tablet 0   senna-docusate (SENOKOT-S) 8.6-50 MG tablet Take 2 tablets by mouth at bedtime. For AFTER surgery, do not take if having diarrhea 30 tablet 0   No current facility-administered medications for this visit.    SUMMARY OF ONCOLOGIC HISTORY: Oncology History Overview Note  Er neg, MSI stable, PD-L1 80% No genetic testing done, did not meet criteria   Uterine cancer (HCC)  10/23/2022 Imaging   1. 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 2. Multiple prominent palpable lymph nodes in the right groin measuring up to 2.1 cm, nonspecific. 3. Small right popliteal fossa Baker's cyst.   10/25/2022 Imaging   Ct imaging 1. Pelvic lymphadenopathy with the most prominent lymph node along the right iliac chain measuring 5.6 x 2.8 cm. Metastatic lymphadenopathy or lymphoproliferative disease cannot be excluded.  2. 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 3. Prominent  right inguinal lymph nodes. 4. 8 mm sclerotic bone lesion within the right ischium, indeterminate. 5. Aortic atherosclerosis.   Aortic Atherosclerosis (ICD10-I70.0).       11/11/2022 Pathology Results   SURGICAL PATHOLOGY CASE: [id number] PATIENT: [name] Surgical Pathology Report  Specimen Submitted: A. Lymph node, right pelvic  Clinical History: Pelvic lymphadenopathy  DIAGNOSIS: A. LYMPH NODE, RIGHT PELVIC; CT-GUIDED CORE NEEDLE BIOPSY: - METASTATIC POORLY DIFFERENTIATED CARCINOMA, SEE COMMENT.  Comment: Immunohistochemical studies show tumor cells to be positive for CK7, MOC-31, GATA-3, CK5/6, and calretinin (subset). P16 is strongly and diffusely positive. P53 demonstrates an absence of staining within tumor cells, indicative of a null staining pattern. PAX-8, WT-1, D240, TTF-1, cdx-2, and p40 are negative. The pattern of immunohistochemical staining is non-specific. Based on the morphologic features and pattern of immunohistochemical staining the differential diagnosis includes metastatic urothelial carcinoma, metastatic breast carcinoma, and metastatic serous carcinoma of gyn origin. Correlation with radiographic findings is required.  There is sufficient tissue present for ancillary molecular testing.  IHC slides were prepared by [facility], [place], [place]. All controls  stained appropriately.   11/16/2022 PET scan   1. Examination is positive for tracer avid adenopathy within the retroperitoneum, bilateral pelvis and right inguinal region. Findings are compatible with nodal metastasis. Primary neoplasm is not apparent on the current exam.  2. No signs of solid organ metastasis within the abdomen or pelvis. 3. No signs of tracer avid disease above the diaphragm. 4. Asymmetric subcutaneous edema is identified involving the visualized portions of the right lower extremity. 5. 5 mm perifissural nodule in the left mid lung is too  small to characterize by PET-CT. 6. Increased uptake within both lobes of thyroid gland, right greater than left. Correlation with thyroid function tests advised. 7.  Aortic Atherosclerosis (ICD10-I70.0).   11/19/2022 Initial Diagnosis   Gynecologic malignancy (HCC)   11/20/2022 Cancer Staging   Staging form: Corpus Uteri - Carcinoma and Carcinosarcoma, AJCC 8th Edition - Clinical stage from 11/20/2022: FIGO Stage IVB (cTX, cN2a, pM1) - Signed by Artis Delay, MD on 11/20/2022 Stage prefix: Initial diagnosis   12/02/2022 Procedure   Ultrasound and fluoroscopically guided right internal jugular single lumen power port catheter insertion. Tip in the SVC/RA junction. Catheter ready for use.   12/04/2022 - 12/04/2022 Chemotherapy   Patient is on Treatment Plan : UTERINE Carboplatin AUC 6 + Paclitaxel q21d     12/04/2022 -  Chemotherapy   Patient is on Treatment Plan : UTERINE Pembrolizumab (200), Paclitaxel (175), Carboplatin (5) q21d x 6 cycles / Pembrolizumab (400) q42d     12/15/2022 Imaging   1. No adnexal mass. No uterine mass lesion evident and there is no substantial thickening of the endometrium by MRI. Low signal intensity fibrous stroma of the cervix appears preserved although high signal intensity of the cervical mucosa is somewhat prominent. This could be correlated with Pap smear as clinically warranted. 2. Stable right pelvic sidewall and groin lymphadenopathy. 3. Small Bartholin's cysts bilaterally.   02/05/2023 Imaging   CT CHEST ABDOMEN PELVIS W CONTRAST  Result Date: 02/05/2023 CLINICAL DATA:  History of endometrial cancer, monitor. High-risk. * Tracking Code: BO * EXAM: CT CHEST, ABDOMEN, AND PELVIS WITH CONTRAST TECHNIQUE: Multidetector CT imaging of the chest, abdomen and pelvis was performed following the standard protocol during bolus administration of intravenous contrast. RADIATION DOSE REDUCTION: This exam was performed according to the departmental dose-optimization program which  includes automated exposure control, adjustment of the mA and/or kV according to patient size and/or use of iterative reconstruction technique. CONTRAST:  OMNIPAQUE IOHEXOL 300 MG/ML  SOLN COMPARISON:  Multiple priors including MRI pelvis December 15, 2022 PET-CT Nov 16, 2022 and CT abdomen pelvis October 25, 2022. FINDINGS: CT CHEST FINDINGS Cardiovascular: Right chest wall Port-A-Cath with the tip in the right atrium. Aortic atherosclerosis. Normal caliber thoracic aorta. No central pulmonary embolus on this nondedicated study. Normal size heart. No significant pericardial effusion/thickening Mediastinum/Nodes: No suspicious thyroid nodule. No pathologically enlarged mediastinal, hilar or axillary lymph nodes the esophagus is grossly unremarkable. Lungs/Pleura: Stable scattered tiny pulmonary nodules. For instance: -2 mm right upper lobe pulmonary nodule on image 29/3, unchanged -2 mm pulmonary nodule along the right major fissure on image 73/3, unchanged -4 mm pulmonary nodule along the left major fissure on image 73/3, unchanged. No pleural effusion.  No pneumothorax. Musculoskeletal: No suspicious chest wall lesion. No aggressive lytic or blastic lesion of bone. CT ABDOMEN PELVIS FINDINGS Hepatobiliary: Diffuse hepatic steatosis. Gallbladder is unremarkable. No biliary ductal dilation Pancreas: No pancreatic ductal dilation or evidence of acute inflammation Spleen: No splenomegaly. Adrenals/Urinary Tract:  Bilateral adrenal glands appear normal. No hydronephrosis. Kidneys demonstrate symmetric enhancement. No suspicious renal mass. Urinary bladder is not well evaluated due to underdistention and streak artifact from left hip arthroplasty. Stomach/Bowel: No radiopaque enteric contrast material was administered. Hyperdensity along the posterior aspect of the stomach on image 53/2 appears similar on delayed imaging and is compatible with ingested material. No pathologic dilation of large or small bowel. No evidence  of acute bowel inflammation. Vascular/Lymphatic: Normal caliber abdominal aorta. Aortic atherosclerosis. The portal, splenic and superior mesenteric veins are patent. Decreased size of the retroperitoneal, bilateral pelvic sidewall/iliac side chain and right inguinal lymph nodes. For reference: -Retrocaval lymph node measures 6 mm in short axis on image 69/2 previously 11 mm -Right sidewall lymph node measures 1.5 cm in short axis on image 110/2 previously 2.9 cm Reproductive: Evaluation of the pelvic reproductive structures is limited by streak artifact from left hip arthroplasty. Other: No significant abdominopelvic free fluid. No discrete peritoneal or omental nodularity. Musculoskeletal: No aggressive lytic or blastic lesion of bone. Multilevel degenerative changes spine. Left total hip arthroplasty. Degenerative change of the right hip. IMPRESSION: 1. Decreased size of the retroperitoneal, bilateral pelvic sidewall/iliac side chain and right inguinal lymph nodes, compatible with treatment response. 2. Stable scattered tiny pulmonary nodules, nonspecific but favored benign. Suggest continued attention on follow-up imaging 3. No evidence of new or progressive metastatic disease within the chest, abdomen or pelvis. 4. Diffuse hepatic steatosis. 5.  Aortic Atherosclerosis (ICD10-I70.0). Electronically Signed   By: Maudry Mayhew M.D.   On: 02/05/2023 15:03   XR Wrist Complete Right  Result Date: 01/13/2023 Right wrist  3 views: Right wrist distal radius fracture remains unchanged in overall position alignment.  Fracture is healed.  No acute findings otherwise.       PHYSICAL EXAMINATION: ECOG PERFORMANCE STATUS: 1 - Symptomatic but completely ambulatory  Vitals:   02/08/23 1436  BP: 116/78  Pulse: 78  Resp: 18  Temp: 98.8 F (37.1 C)  SpO2: 99%   Filed Weights   02/08/23 1436  Weight: 151 lb 9.6 oz (68.8 kg)    GENERAL:alert, no distress and comfortable NEURO: alert & oriented x 3 with  fluent speech, no focal motor/sensory deficits  LABORATORY DATA:  I have reviewed the data as listed    Component Value Date/Time   NA 139 01/15/2023 0837   K 4.2 01/15/2023 0837   CL 104 01/15/2023 0837   CO2 28 01/15/2023 0837   GLUCOSE 140 (H) 01/15/2023 0837   BUN 17 01/15/2023 0837   CREATININE 0.67 01/15/2023 0837   CREATININE 0.83 12/14/2016 1247   CALCIUM 9.7 01/15/2023 0837   PROT 7.3 01/15/2023 0837   ALBUMIN 4.2 01/15/2023 0837   AST 16 01/15/2023 0837   ALT 11 01/15/2023 0837   ALKPHOS 72 01/15/2023 0837   BILITOT 0.5 01/15/2023 0837   GFRNONAA >60 01/15/2023 0837   GFRNONAA 81 11/28/2015 1516   GFRAA >60 12/21/2015 0432   GFRAA >89 11/28/2015 1516    No results found for: "SPEP", "UPEP"  Lab Results  Component Value Date   WBC 7.3 01/15/2023   NEUTROABS 6.6 01/15/2023   HGB 11.3 (L) 01/15/2023   HCT 32.9 (L) 01/15/2023   MCV 89.4 01/15/2023   PLT 238 01/15/2023      Chemistry      Component Value Date/Time   NA 139 01/15/2023 0837   K 4.2 01/15/2023 0837   CL 104 01/15/2023 0837   CO2 28 01/15/2023 0837  BUN 17 01/15/2023 0837   CREATININE 0.67 01/15/2023 0837   CREATININE 0.83 12/14/2016 1247      Component Value Date/Time   CALCIUM 9.7 01/15/2023 0837   ALKPHOS 72 01/15/2023 0837   AST 16 01/15/2023 0837   ALT 11 01/15/2023 0837   BILITOT 0.5 01/15/2023 0837       RADIOGRAPHIC STUDIES: I have reviewed multiple imaging studies with the patient I have personally reviewed the radiological images as listed and agreed with the findings in the report. CT CHEST ABDOMEN PELVIS W CONTRAST  Addendum Date: 02/08/2023   ADDENDUM REPORT: 02/08/2023 16:02 ADDENDUM: Similar size of the right inguinal lymph nodes. For reference the largest lymph node measures 9 mm in short axis on image 118/2 previously 10 mm. Electronically Signed   By: Maudry Mayhew M.D.   On: 02/08/2023 16:02   Result Date: 02/08/2023 CLINICAL DATA:  History of endometrial cancer,  monitor. High-risk. * Tracking Code: BO * EXAM: CT CHEST, ABDOMEN, AND PELVIS WITH CONTRAST TECHNIQUE: Multidetector CT imaging of the chest, abdomen and pelvis was performed following the standard protocol during bolus administration of intravenous contrast. RADIATION DOSE REDUCTION: This exam was performed according to the departmental dose-optimization program which includes automated exposure control, adjustment of the mA and/or kV according to patient size and/or use of iterative reconstruction technique. CONTRAST:  OMNIPAQUE IOHEXOL 300 MG/ML  SOLN COMPARISON:  Multiple priors including MRI pelvis December 15, 2022 PET-CT Nov 16, 2022 and CT abdomen pelvis October 25, 2022. FINDINGS: CT CHEST FINDINGS Cardiovascular: Right chest wall Port-A-Cath with the tip in the right atrium. Aortic atherosclerosis. Normal caliber thoracic aorta. No central pulmonary embolus on this nondedicated study. Normal size heart. No significant pericardial effusion/thickening Mediastinum/Nodes: No suspicious thyroid nodule. No pathologically enlarged mediastinal, hilar or axillary lymph nodes the esophagus is grossly unremarkable. Lungs/Pleura: Stable scattered tiny pulmonary nodules. For instance: -2 mm right upper lobe pulmonary nodule on image 29/3, unchanged -2 mm pulmonary nodule along the right major fissure on image 73/3, unchanged -4 mm pulmonary nodule along the left major fissure on image 73/3, unchanged. No pleural effusion.  No pneumothorax. Musculoskeletal: No suspicious chest wall lesion. No aggressive lytic or blastic lesion of bone. CT ABDOMEN PELVIS FINDINGS Hepatobiliary: Diffuse hepatic steatosis. Gallbladder is unremarkable. No biliary ductal dilation Pancreas: No pancreatic ductal dilation or evidence of acute inflammation Spleen: No splenomegaly. Adrenals/Urinary Tract: Bilateral adrenal glands appear normal. No hydronephrosis. Kidneys demonstrate symmetric enhancement. No suspicious renal mass. Urinary bladder  is not well evaluated due to underdistention and streak artifact from left hip arthroplasty. Stomach/Bowel: No radiopaque enteric contrast material was administered. Hyperdensity along the posterior aspect of the stomach on image 53/2 appears similar on delayed imaging and is compatible with ingested material. No pathologic dilation of large or small bowel. No evidence of acute bowel inflammation. Vascular/Lymphatic: Normal caliber abdominal aorta. Aortic atherosclerosis. The portal, splenic and superior mesenteric veins are patent. Decreased size of the retroperitoneal, bilateral pelvic sidewall/iliac side chain and right inguinal lymph nodes. For reference: -Retrocaval lymph node measures 6 mm in short axis on image 69/2 previously 11 mm -Right sidewall lymph node measures 1.5 cm in short axis on image 110/2 previously 2.9 cm Reproductive: Evaluation of the pelvic reproductive structures is limited by streak artifact from left hip arthroplasty. Other: No significant abdominopelvic free fluid. No discrete peritoneal or omental nodularity. Musculoskeletal: No aggressive lytic or blastic lesion of bone. Multilevel degenerative changes spine. Left total hip arthroplasty. Degenerative change of the right  hip. IMPRESSION: 1. Decreased size of the retroperitoneal, bilateral pelvic sidewall/iliac side chain and right inguinal lymph nodes, compatible with treatment response. 2. Stable scattered tiny pulmonary nodules, nonspecific but favored benign. Suggest continued attention on follow-up imaging 3. No evidence of new or progressive metastatic disease within the chest, abdomen or pelvis. 4. Diffuse hepatic steatosis. 5.  Aortic Atherosclerosis (ICD10-I70.0). Electronically Signed: By: Maudry Mayhew M.D. On: 02/05/2023 15:03   XR Wrist Complete Right  Result Date: 01/13/2023 Right wrist  3 views: Right wrist distal radius fracture remains unchanged in overall position alignment.  Fracture is healed.  No acute findings  otherwise.

## 2023-02-09 NOTE — Progress Notes (Signed)
Anesthesia Review:  PCP: Cardiologist : Chest x-ray : CT Chest- 02/05/23  EKG : Echo : Stress test: Cardiac Cath :  Activity level:  Sleep Study/ CPAP : Fasting Blood Sugar :      / Checks Blood Sugar -- times a day:   Blood Thinner/ Instructions /Last Dose: ASA / Instructions/ Last Dose :    Eliquis

## 2023-02-09 NOTE — Patient Instructions (Signed)
SURGICAL WAITING ROOM VISITATION  Patients having surgery or a procedure may have no more than 2 support people in the waiting area - these visitors may rotate.    Children under the age of 64 must have an adult with them who is not the patient.  Due to an increase in RSV and influenza rates and associated hospitalizations, children ages 22 and under may not visit patients in [facility] hospitals.  If the patient needs to stay at the hospital during part of their recovery, the visitor guidelines for inpatient rooms apply. Pre-op nurse will coordinate an appropriate time for 1 support person to accompany patient in pre-op.  This support person may not rotate.    Please refer to the [facility] website for the visitor guidelines for Inpatients (after your surgery is over and you are in a regular room).       Your procedure is scheduled on:  02/16/2023    Report to [facility] Main Entrance    Report to admitting at  1115AM   Call this number if you have problems the morning of surgery [phone]               EAt a light diet the day before surgery  Avoid gas producing foods.     Do not eat food :After Midnight.   After Midnight you may have the following liquids until __ 1015____ AM DAY OF SURGERY  Water Non-Citrus Juices (without pulp, NO RED-Apple, White grape, White cranberry) Black Coffee (NO MILK/CREAM OR CREAMERS, sugar ok)  Clear Tea (NO MILK/CREAM OR CREAMERS, sugar ok) regular and decaf                             Plain Jell-O (NO RED)                                           Fruit ices (not with fruit pulp, NO RED)                                     Popsicles (NO RED)                                                               Sports drinks like Gatorade (NO RED)             If you have questions, please contact your surgeon's office.       Oral Hygiene is also important to reduce your risk of infection.                                    Remember  - BRUSH YOUR TEETH THE MORNING OF SURGERY WITH YOUR REGULAR TOOTHPASTE  DENTURES WILL BE REMOVED PRIOR TO SURGERY PLEASE DO NOT APPLY "Poly grip" OR ADHESIVES!!!   Do NOT smoke after Midnight   Stop all vitamins and herbal supplements 7 days before surgery.   Take these medicines the morning of surgery with A  SIP OF WATER:  synthroid, claritin if needed   DO NOT TAKE ANY ORAL DIABETIC MEDICATIONS DAY OF YOUR SURGERY  Bring CPAP mask and tubing day of surgery.                              You may not have any metal on your body including hair pins, jewelry, and body piercing             Do not wear make-up, lotions, powders, perfumes/cologne, or deodorant  Do not wear nail polish including gel and S&S, artificial/acrylic nails, or any other type of covering on natural nails including finger and toenails. If you have artificial nails, gel coating, etc. that needs to be removed by a nail salon please have this removed prior to surgery or surgery may need to be canceled/ delayed if the surgeon/ anesthesia feels like they are unable to be safely monitored.   Do not shave  48 hours prior to surgery.               Men may shave face and neck.   Do not bring valuables to the hospital. [facility] IS NOT             RESPONSIBLE   FOR VALUABLES.   Contacts, glasses, dentures or bridgework may not be worn into surgery.   Bring small overnight bag day of surgery.   DO NOT BRING YOUR HOME MEDICATIONS TO THE HOSPITAL. PHARMACY WILL DISPENSE MEDICATIONS LISTED ON YOUR MEDICATION LIST TO YOU DURING YOUR ADMISSION IN THE HOSPITAL!    Patients discharged on the day of surgery will not be allowed to drive home.  Someone NEEDS to stay with you for the first 24 hours after anesthesia.   Special Instructions: Bring a copy of your healthcare power of attorney and living will documents the day of surgery if you haven't scanned them before.              Please read over the following fact sheets you  were given: IF YOU HAVE QUESTIONS ABOUT YOUR PRE-OP INSTRUCTIONS PLEASE CALL [phone]   If you received a COVID test during your pre-op visit  it is requested that you wear a mask when out in public, stay away from anyone that may not be feeling well and notify your surgeon if you develop symptoms. If you test positive for Covid or have been in contact with anyone that has tested positive in the last 10 days please notify you surgeon.    [facility] - Preparing for Surgery Before surgery, you can play an important role.  Because skin is not sterile, your skin needs to be as free of germs as possible.  You can reduce the number of germs on your skin by washing with CHG (chlorahexidine gluconate) soap before surgery.  CHG is an antiseptic cleaner which kills germs and bonds with the skin to continue killing germs even after washing. Please DO NOT use if you have an allergy to CHG or antibacterial soaps.  If your skin becomes reddened/irritated stop using the CHG and inform your nurse when you arrive at Short Stay. Do not shave (including legs and underarms) for at least 48 hours prior to the first CHG shower.  You may shave your face/neck. Please follow these instructions carefully:  1.  Shower with CHG Soap the night before surgery and the  morning of Surgery.  2.  If you choose  to wash your hair, wash your hair first as usual with your  normal  shampoo.  3.  After you shampoo, rinse your hair and body thoroughly to remove the  shampoo.                           4.  Use CHG as you would any other liquid soap.  You can apply chg directly  to the skin and wash                       Gently with a scrungie or clean washcloth.  5.  Apply the CHG Soap to your body ONLY FROM THE NECK DOWN.   Do not use on face/ open                           Wound or open sores. Avoid contact with eyes, ears mouth and genitals (private parts).                       Wash face,  Genitals (private parts) with your normal  soap.             6.  Wash thoroughly, paying special attention to the area where your surgery  will be performed.  7.  Thoroughly rinse your body with warm water from the neck down.  8.  DO NOT shower/wash with your normal soap after using and rinsing off  the CHG Soap.                9.  Pat yourself dry with a clean towel.            10.  Wear clean pajamas.            11.  Place clean sheets on your bed the night of your first shower and do not  sleep with pets. Day of Surgery : Do not apply any lotions/deodorants the morning of surgery.  Please wear clean clothes to the hospital/surgery center.  FAILURE TO FOLLOW THESE INSTRUCTIONS MAY RESULT IN THE CANCELLATION OF YOUR SURGERY PATIENT SIGNATURE_________________________________  NURSE SIGNATURE__________________________________  ________________________________________________________________________

## 2023-02-09 NOTE — Assessment & Plan Note (Signed)
I have reviewed multiple imaging studies with the patient and her husband She has remarkable response to therapy The only remaining lymph node in the right pelvic sidewall is 1.5 cm, the rest of them are less than 1 cm. She will proceed with surgery as scheduled with GYN surgeon next week I will review the pathology at the end of the month If she has successful laparoscopic procedure, she can resume treatment as early as 3 to 4 weeks after surgery However, if she ended up with a larger incision, he may be another month before she can resume treatment We discussed importance of therapeutic and diagnostic aspect of her surgery

## 2023-02-09 NOTE — Assessment & Plan Note (Signed)
She is doing well with anticoagulation therapy The patient is given verbal instruction to stop anticoagulation for 48 hours prior to surgery If she has no signs of excessive bleeding, she can resume the day 48 hours after surgery

## 2023-02-11 ENCOUNTER — Encounter: Payer: Self-pay | Admitting: Physical Therapy

## 2023-02-11 ENCOUNTER — Encounter (HOSPITAL_COMMUNITY): Payer: Self-pay

## 2023-02-11 ENCOUNTER — Other Ambulatory Visit: Payer: Self-pay

## 2023-02-11 ENCOUNTER — Encounter (HOSPITAL_COMMUNITY)
Admission: RE | Admit: 2023-02-11 | Discharge: 2023-02-11 | Disposition: A | Discharge status: Home / Self Care | Payer: Medicare PPO | Source: Ambulatory Visit | Attending: Psychiatry | Admitting: Psychiatry

## 2023-02-11 ENCOUNTER — Ambulatory Visit: Payer: Medicare PPO | Attending: Psychiatry | Admitting: Physical Therapy

## 2023-02-11 VITALS — BP 110/79 | HR 76 | Temp 98.3°F | Resp 16 | Ht 62.0 in | Wt 144.0 lb

## 2023-02-11 DIAGNOSIS — R262 Difficulty in walking, not elsewhere classified: Secondary | ICD-10-CM

## 2023-02-11 DIAGNOSIS — Z01818 Encounter for other preprocedural examination: Secondary | ICD-10-CM

## 2023-02-11 DIAGNOSIS — C549 Malignant neoplasm of corpus uteri, unspecified: Secondary | ICD-10-CM

## 2023-02-11 DIAGNOSIS — I890 Lymphedema, not elsewhere classified: Secondary | ICD-10-CM

## 2023-02-11 DIAGNOSIS — Z01812 Encounter for preprocedural laboratory examination: Secondary | ICD-10-CM | POA: Insufficient documentation

## 2023-02-11 DIAGNOSIS — C579 Malignant neoplasm of female genital organ, unspecified: Secondary | ICD-10-CM | POA: Insufficient documentation

## 2023-02-11 HISTORY — DX: Anemia, unspecified: D64.9

## 2023-02-11 HISTORY — DX: Personal history of other venous thrombosis and embolism: Z86.718

## 2023-02-11 LAB — COMPREHENSIVE METABOLIC PANEL
ALT: 14 U/L (ref 0–44)
AST: 19 U/L (ref 15–41)
Albumin: 3.7 g/dL (ref 3.5–5.0)
Alkaline Phosphatase: 67 U/L (ref 38–126)
Anion gap: 9 (ref 5–15)
BUN: 15 mg/dL (ref 8–23)
CO2: 28 mmol/L (ref 22–32)
Calcium: 9.6 mg/dL (ref 8.9–10.3)
Chloride: 102 mmol/L (ref 98–111)
Creatinine, Ser: 0.82 mg/dL (ref 0.44–1.00)
GFR, Estimated: >60 mL/min (ref >60)
Glucose, Bld: 90 mg/dL (ref 70–99)
Potassium: 4.3 mmol/L (ref 3.5–5.1)
Sodium: 139 mmol/L (ref 135–145)
Total Bilirubin: 0.6 mg/dL (ref 0.3–1.2)
Total Protein: 6.9 g/dL (ref 6.5–8.1)

## 2023-02-11 LAB — CBC WITH DIFFERENTIAL/PLATELET
Abs Immature Granulocytes: 0.01 10*3/uL (ref 0.00–0.07)
Basophils Absolute: 0.1 10*3/uL (ref 0.0–0.1)
Basophils Relative: 1 %
Eosinophils Absolute: 0.1 10*3/uL (ref 0.0–0.5)
Eosinophils Relative: 3 %
HCT: 35.8 % — ABNORMAL LOW (ref 36.0–46.0)
Hemoglobin: 11.8 g/dL — ABNORMAL LOW (ref 12.0–15.0)
Immature Granulocytes: 0 %
Lymphocytes Relative: 28 %
Lymphs Abs: 1.1 10*3/uL (ref 0.7–4.0)
MCH: 30.7 pg (ref 26.0–34.0)
MCHC: 33 g/dL (ref 30.0–36.0)
MCV: 93.2 fL (ref 80.0–100.0)
Monocytes Absolute: 0.4 10*3/uL (ref 0.1–1.0)
Monocytes Relative: 9 %
Neutro Abs: 2.3 10*3/uL (ref 1.7–7.7)
Neutrophils Relative %: 59 %
Platelets: 211 10*3/uL (ref 150–400)
RBC: 3.84 MIL/uL — ABNORMAL LOW (ref 3.87–5.11)
RDW: 14.2 % (ref 11.5–15.5)
WBC: 4 10*3/uL (ref 4.0–10.5)
nRBC: 0 % (ref 0.0–0.2)

## 2023-02-11 NOTE — Therapy (Signed)
OUTPATIENT PHYSICAL THERAPY ONCOLOGY EVALUATION  Patient Name: [name] MRN: [id number] DOB:18-Sep-1950, 72 y.o., female Today's Date: 02/11/2023  END OF SESSION:  PT End of Session - 02/11/23 1452     Visit Number 1    Number of Visits 13    Date for PT Re-Evaluation 04/08/23   will begin PT 2-3 wks after surgery   PT Start Time 1405    PT Stop Time 1441    PT Time Calculation (min) 36 min    Activity Tolerance Patient tolerated treatment well    Behavior During Therapy WFL for tasks assessed/performed             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   Top of Left Hand(Tx p Bx)   Past Surgical History:  Procedure Laterality Date   CARPAL TUNNEL RELEASE     BILAT     CATARACT EXTRACTION     BILAT    ECTOPIC PREGNANCY SURGERY     in the setting of prior BTL, lapartomy   EYE SURGERY     IR IMAGING GUIDED PORT INSERTION  12/02/2022   KNEE CARTILAGE SURGERY     RIGHT    (MENISCUS)   MOHS SURGERY     left nose/inner eye for basal cell   TONSILLECTOMY     TOTAL HIP ARTHROPLASTY Left 12/19/2015   TOTAL HIP ARTHROPLASTY Left 12/19/2015   Procedure: LEFT TOTAL HIP ARTHROPLASTY;  Surgeon: Valeria Batman, MD;  Location: [facility] OR;  Service: Orthopedics;  Laterality: Left;   TRIGGER FINGER RELEASE     TUBAL LIGATION     Patient Active Problem List   Diagnosis Date Noted   Family history of uterine cancer    Family history of kidney cancer    Hot flashes  01/15/2023   Multiple thyroid nodules 12/21/2022   Acute deep vein thrombosis (DVT) of right lower extremity (HCC) 11/20/2022   Mild anemia 11/20/2022   Uterine cancer (HCC) 11/19/2022   Unilateral primary osteoarthritis, left knee 08/05/2022   Unilateral primary osteoarthritis, right knee 08/05/2022   Bilateral primary osteoarthritis of knee 05/06/2022   Primary osteoarthritis of left hip 12/19/2015   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 I ended up have a blood clot. I had a lymph node biopsy but now I am going to have nodes removed when I have my total hysterectomy. They have not found the primary site of the cancer yet but should know after surgery. My leg has continued to swell.   PERTINENT HISTORY:  Will have total hysterectomy and bilateral salpingo oophorectomy with omenectomy on 02/16/23 Uterine cancer 10/23/22 with pelvic lymphadenopathy did neoadj chemo, DVT R LE, needs R TKA PAIN:  Are you having pain? No only pain in R knee when walking  PRECAUTIONS: Other: L THA 2017  WEIGHT BEARING RESTRICTIONS: No  FALLS:  Has patient fallen in last 6 months? Yes. Number of falls 1x, while on tennis court, minor displacement of radial bone  LIVING  ENVIRONMENT: Lives with: lives with their spouse, Susy Frizzle Lives in: House/apartment Stairs: Yes; Internal: 14 steps; on right going up Has following equipment at home: Single point cane, Walker - 2 wheeled, Crutches, and bed side commode  OCCUPATION: part time, administrative in a school   LEISURE: walks daily for at least 30 min, was playing tennis 4-5x/wk prior to diagnosis and hand injury with fall  HAND DOMINANCE: right   PRIOR LEVEL OF FUNCTION: Independent  PATIENT GOALS: to get the leg back to normal   OBJECTIVE:  COGNITION: Overall cognitive status: Within functional limits for tasks assessed    OBSERVATIONS / OTHER ASSESSMENTS: fullness visible throughout R LE especially at R thigh  POSTURE: forward head and rounded shoulders   LYMPHEDEMA ASSESSMENTS:   LOWER EXTREMITY LANDMARK RIGHT eval  At groin   30 cm proximal to suprapatella   20 cm proximal to suprapatella 61.6  10 cm proximal to suprapatella 51  At midpatella / popliteal crease 40.9  30 cm proximal to floor at lateral plantar foot 37.6  20 cm proximal to floor at lateral plantar foot 32.3  10 cm proximal to floor at lateral plantar foot 21.8  Circumference of ankle/heel   5 cm proximal to 1st MTP joint 22.5  Across MTP joint 22.2  Around proximal great toe 7.5  (Blank rows = not tested)  LOWER EXTREMITY LANDMARK LEFT eval  At groin   30 cm proximal to suprapatella   20 cm proximal to suprapatella 54.5  10 cm proximal to suprapatella 47  At midpatella / popliteal crease 39.9  30 cm proximal to floor at lateral plantar foot 37  20 cm proximal to floor at lateral plantar foot 29.4  10 cm proximal to floor at lateral plantar foot 21.4  Circumference of ankle/heel   5 cm proximal to 1st MTP joint 21.4  Across MTP joint 22  Around proximal great toe 7.4  (Blank rows = not tested)  LLIS: 26.47    TODAY'S TREATMENT:  DATE:  02/11/23- baseline eval only  PATIENT EDUCATION:  Education details: anatomy and physiology of the lymphatic system, need for compression and need for reducing edema before obtaining compression  Person educated: Patient Education method: Explanation Education comprehension: verbalized understanding  HOME EXERCISE PROGRAM: None yet  ASSESSMENT:  CLINICAL IMPRESSION: Patient is a 72 y.o. female who was seen today for physical therapy evaluation and treatment for R LE lymphedema. Pt began developing RLE swelling in April and was found to have a blood clot in R thigh as a result of lymphadenopathy from possible uterine cancer. They have been unable to find the primary source. She will undergo a total hysterectomy and bilateral salpingo oophorectomy with omenectomy on 02/16/23. Baseline circumference measurements were taken today. Will reassess measurements and begin complete decongestive therapy after she recovers from her surgery to decrease RLE lymphedema and assist pt with obtaining appropriate compression garments for long term management of lymphedema.    OBJECTIVE IMPAIRMENTS: decreased knowledge of condition, decreased knowledge of use of DME, difficulty walking, and increased edema.   ACTIVITY LIMITATIONS:  exercise  PARTICIPATION LIMITATIONS: community activity  PERSONAL FACTORS: Time since onset of injury/illness/exacerbation are also affecting patient's functional outcome.   REHAB POTENTIAL: Good  CLINICAL DECISION MAKING: Stable/uncomplicated  EVALUATION COMPLEXITY: Low  GOALS: Goals reviewed with patient? Yes  SHORT TERM GOALS=LONG TERM GOALS Target date: 04/08/23  Pt will be independent in self MLD for R LE lymphedema to decrease swelling.  Baseline: Goal status: INITIAL  2.  Pt will obtain appropriate compression garments for long term management of lymphedema.  Baseline:  Goal status: INITIAL  3.  Pt  will demonstrate a 4 cm decrease in edema at 20 cm superior to patella to decrease risk of infection.  Baseline:  Goal status: INITIAL   PLAN:  PT FREQUENCY: 3x/week will begin after recovery from surgery  PT DURATION: 4 weeks  PLANNED INTERVENTIONS: Therapeutic exercises, Therapeutic activity, Patient/Family education, Self Care, Orthotic/Fit training, Manual lymph drainage, Compression bandaging, scar mobilization, Taping, Vasopneumatic device, Manual therapy, and Re-evaluation  PLAN FOR NEXT SESSION: begin CDT to RLE, remeasure circumferences   [employer], PT 02/11/2023, 3:00 PM

## 2023-02-12 ENCOUNTER — Ambulatory Visit: Payer: Medicare PPO

## 2023-02-15 ENCOUNTER — Telehealth: Payer: Self-pay | Admitting: *Deleted

## 2023-02-15 NOTE — Telephone Encounter (Signed)
 Telephone call to check on pre-operative status.  Patient compliant with pre-operative instructions.  Reinforced nothing to eat after midnight. Clear liquids until 1000. Patient to arrive at 1100.  No questions or concerns voiced.  Instructed to call for any needs.

## 2023-02-16 ENCOUNTER — Inpatient Hospital Stay (HOSPITAL_COMMUNITY): Payer: Medicare PPO | Admitting: Physician Assistant

## 2023-02-16 ENCOUNTER — Inpatient Hospital Stay (HOSPITAL_COMMUNITY)
Admission: RE | Admit: 2023-02-16 | Discharge: 2023-02-18 | DRG: 741 | Disposition: A | Discharge status: Home Health | Payer: Medicare PPO | Attending: Gynecologic Oncology | Admitting: Gynecologic Oncology

## 2023-02-16 ENCOUNTER — Encounter (HOSPITAL_COMMUNITY): Payer: Self-pay | Admitting: Psychiatry

## 2023-02-16 ENCOUNTER — Other Ambulatory Visit: Payer: Self-pay

## 2023-02-16 ENCOUNTER — Encounter (HOSPITAL_COMMUNITY)
Admission: RE | Disposition: A | Discharge status: Home Health | Payer: Self-pay | Source: Home / Self Care | Attending: Psychiatry

## 2023-02-16 ENCOUNTER — Inpatient Hospital Stay (HOSPITAL_COMMUNITY): Payer: Medicare PPO | Admitting: Anesthesiology

## 2023-02-16 DIAGNOSIS — Z7901 Long term (current) use of anticoagulants: Secondary | ICD-10-CM | POA: Diagnosis not present

## 2023-02-16 DIAGNOSIS — Z85828 Personal history of other malignant neoplasm of skin: Secondary | ICD-10-CM | POA: Diagnosis not present

## 2023-02-16 DIAGNOSIS — R591 Generalized enlarged lymph nodes: Secondary | ICD-10-CM | POA: Diagnosis present

## 2023-02-16 DIAGNOSIS — Z9889 Other specified postprocedural states: Secondary | ICD-10-CM

## 2023-02-16 DIAGNOSIS — E039 Hypothyroidism, unspecified: Secondary | ICD-10-CM | POA: Diagnosis present

## 2023-02-16 DIAGNOSIS — Z9221 Personal history of antineoplastic chemotherapy: Secondary | ICD-10-CM | POA: Diagnosis not present

## 2023-02-16 DIAGNOSIS — C55 Malignant neoplasm of uterus, part unspecified: Secondary | ICD-10-CM | POA: Diagnosis present

## 2023-02-16 DIAGNOSIS — R110 Nausea: Secondary | ICD-10-CM | POA: Diagnosis not present

## 2023-02-16 DIAGNOSIS — Z9851 Tubal ligation status: Secondary | ICD-10-CM

## 2023-02-16 DIAGNOSIS — I700 Atherosclerosis of aorta: Secondary | ICD-10-CM | POA: Diagnosis present

## 2023-02-16 DIAGNOSIS — M1990 Unspecified osteoarthritis, unspecified site: Secondary | ICD-10-CM | POA: Diagnosis present

## 2023-02-16 DIAGNOSIS — Z7952 Long term (current) use of systemic steroids: Secondary | ICD-10-CM

## 2023-02-16 DIAGNOSIS — Z8049 Family history of malignant neoplasm of other genital organs: Secondary | ICD-10-CM

## 2023-02-16 DIAGNOSIS — Z79890 Hormone replacement therapy (postmenopausal): Secondary | ICD-10-CM

## 2023-02-16 DIAGNOSIS — C577 Malignant neoplasm of other specified female genital organs: Secondary | ICD-10-CM

## 2023-02-16 DIAGNOSIS — Z8249 Family history of ischemic heart disease and other diseases of the circulatory system: Secondary | ICD-10-CM

## 2023-02-16 DIAGNOSIS — Z01818 Encounter for other preprocedural examination: Secondary | ICD-10-CM

## 2023-02-16 DIAGNOSIS — C801 Malignant (primary) neoplasm, unspecified: Secondary | ICD-10-CM | POA: Diagnosis not present

## 2023-02-16 DIAGNOSIS — I82401 Acute embolism and thrombosis of unspecified deep veins of right lower extremity: Secondary | ICD-10-CM

## 2023-02-16 DIAGNOSIS — Z96642 Presence of left artificial hip joint: Secondary | ICD-10-CM | POA: Diagnosis present

## 2023-02-16 DIAGNOSIS — C579 Malignant neoplasm of female genital organ, unspecified: Principal | ICD-10-CM | POA: Diagnosis present

## 2023-02-16 DIAGNOSIS — Z79899 Other long term (current) drug therapy: Secondary | ICD-10-CM | POA: Diagnosis not present

## 2023-02-16 HISTORY — PX: LYMPH NODE DISSECTION: SHX5087

## 2023-02-16 HISTORY — PX: LAPAROTOMY: SHX154

## 2023-02-16 LAB — PREPARE RBC (CROSSMATCH)

## 2023-02-16 SURGERY — XI ROBOTIC ASSISTED TOTAL HYSTERECTOMY BILATERAL SALPINGO OOPHORECTOMY WITH OMENTECTOMY AND DEBULKING
Anesthesia: General | Laterality: Right

## 2023-02-16 MED ORDER — MIDAZOLAM HCL 2 MG/2ML IJ SOLN
INTRAMUSCULAR | Status: AC
  Filled 2023-02-16: qty 2

## 2023-02-16 MED ORDER — STERILE WATER FOR IRRIGATION IR SOLN
Status: DC | PRN
  Administered 2023-02-16: 1000 mL

## 2023-02-16 MED ORDER — POVIDONE-IODINE 10 % EX SWAB
2.0000 | Freq: Once | CUTANEOUS | Status: AC
  Administered 2023-02-16: 2 via TOPICAL

## 2023-02-16 MED ORDER — KETOROLAC TROMETHAMINE 30 MG/ML IJ SOLN
INTRAMUSCULAR | Status: DC | PRN
Start: 2023-02-16 — End: 2023-02-16
  Administered 2023-02-16: 30 mg via INTRAVENOUS

## 2023-02-16 MED ORDER — HYDROMORPHONE HCL 1 MG/ML IJ SOLN: 0.2500 mg | INTRAMUSCULAR | Status: DC | PRN

## 2023-02-16 MED ORDER — OXYCODONE HCL 5 MG PO TABS
5.0000 mg | ORAL_TABLET | ORAL | Status: DC | PRN
  Filled 2023-02-16: qty 1

## 2023-02-16 MED ORDER — PROPOFOL 500 MG/50ML IV EMUL
INTRAVENOUS | Status: DC | PRN
  Administered 2023-02-16: 50 ug/kg/min via INTRAVENOUS

## 2023-02-16 MED ORDER — LACTATED RINGERS IR SOLN
Status: DC | PRN
  Administered 2023-02-16: 3000 mL

## 2023-02-16 MED ORDER — PROPOFOL 1000 MG/100ML IV EMUL
INTRAVENOUS | Status: AC
  Filled 2023-02-16: qty 100

## 2023-02-16 MED ORDER — LIDOCAINE HCL (CARDIAC) PF 100 MG/5ML IV SOSY
PREFILLED_SYRINGE | INTRAVENOUS | Status: DC | PRN
  Administered 2023-02-16: 80 mg via INTRAVENOUS

## 2023-02-16 MED ORDER — ROCURONIUM BROMIDE 100 MG/10ML IV SOLN
INTRAVENOUS | Status: DC | PRN
  Administered 2023-02-16: 20 mg via INTRAVENOUS
  Administered 2023-02-16 (×2): 30 mg via INTRAVENOUS
  Administered 2023-02-16: 50 mg via INTRAVENOUS

## 2023-02-16 MED ORDER — FENTANYL CITRATE (PF) 250 MCG/5ML IJ SOLN
INTRAMUSCULAR | Status: AC
  Filled 2023-02-16: qty 5

## 2023-02-16 MED ORDER — IBUPROFEN 400 MG PO TABS
600.0000 mg | ORAL_TABLET | Freq: Four times a day (QID) | ORAL | Status: DC
  Administered 2023-02-17 – 2023-02-18 (×6): 600 mg via ORAL
  Filled 2023-02-16 (×6): qty 1

## 2023-02-16 MED ORDER — PHENYLEPHRINE HCL (PRESSORS) 10 MG/ML IV SOLN
INTRAVENOUS | Status: AC
  Filled 2023-02-16: qty 1

## 2023-02-16 MED ORDER — ORAL CARE MOUTH RINSE: 15.0000 mL | Freq: Once | OROMUCOSAL | Status: AC

## 2023-02-16 MED ORDER — LEVOTHYROXINE SODIUM 75 MCG PO TABS
75.0000 ug | ORAL_TABLET | Freq: Every day | ORAL | Status: DC
  Administered 2023-02-17 – 2023-02-18 (×2): 75 ug via ORAL
  Filled 2023-02-16 (×2): qty 1

## 2023-02-16 MED ORDER — BUPIVACAINE HCL 0.25 % IJ SOLN
INTRAMUSCULAR | Status: AC
  Filled 2023-02-16: qty 1

## 2023-02-16 MED ORDER — SENNOSIDES-DOCUSATE SODIUM 8.6-50 MG PO TABS
2.0000 | ORAL_TABLET | Freq: Every day | ORAL | Status: DC
  Administered 2023-02-16 – 2023-02-17 (×2): 2 via ORAL
  Filled 2023-02-16 (×2): qty 2

## 2023-02-16 MED ORDER — HYDROMORPHONE HCL 1 MG/ML IJ SOLN
0.5000 mg | INTRAMUSCULAR | Status: DC | PRN
  Filled 2023-02-16: qty 0.5

## 2023-02-16 MED ORDER — PHENYLEPHRINE HCL-NACL 20-0.9 MG/250ML-% IV SOLN
INTRAVENOUS | Status: DC | PRN
  Administered 2023-02-16: 25 ug/min via INTRAVENOUS

## 2023-02-16 MED ORDER — ACETAMINOPHEN 500 MG PO TABS
1000.0000 mg | ORAL_TABLET | ORAL | Status: AC
  Administered 2023-02-16: 1000 mg via ORAL
  Filled 2023-02-16: qty 2

## 2023-02-16 MED ORDER — POLYVINYL ALCOHOL 1.4 % OP SOLN
Freq: Three times a day (TID) | OPHTHALMIC | Status: DC
  Filled 2023-02-16: qty 15

## 2023-02-16 MED ORDER — MIDAZOLAM HCL 5 MG/5ML IJ SOLN
INTRAMUSCULAR | Status: DC | PRN
  Administered 2023-02-16 (×2): 1 mg via INTRAVENOUS

## 2023-02-16 MED ORDER — TRAMADOL HCL 50 MG PO TABS
100.0000 mg | ORAL_TABLET | Freq: Two times a day (BID) | ORAL | Status: DC | PRN
  Administered 2023-02-17: 100 mg via ORAL
  Filled 2023-02-16: qty 2

## 2023-02-16 MED ORDER — DROPERIDOL 2.5 MG/ML IJ SOLN: 0.6250 mg | Freq: Once | INTRAMUSCULAR | Status: DC | PRN

## 2023-02-16 MED ORDER — FENTANYL CITRATE (PF) 100 MCG/2ML IJ SOLN
INTRAMUSCULAR | Status: DC | PRN
  Administered 2023-02-16: 50 ug via INTRAVENOUS
  Administered 2023-02-16: 100 ug via INTRAVENOUS
  Administered 2023-02-16: 50 ug via INTRAVENOUS

## 2023-02-16 MED ORDER — LACTATED RINGERS IV SOLN: INTRAVENOUS | Status: DC

## 2023-02-16 MED ORDER — ONDANSETRON HCL 4 MG PO TABS: 4.0000 mg | ORAL_TABLET | Freq: Four times a day (QID) | ORAL | Status: DC | PRN

## 2023-02-16 MED ORDER — ROCURONIUM BROMIDE 10 MG/ML (PF) SYRINGE
PREFILLED_SYRINGE | INTRAVENOUS | Status: AC
  Filled 2023-02-16: qty 10

## 2023-02-16 MED ORDER — ENOXAPARIN SODIUM 40 MG/0.4ML IJ SOSY
40.0000 mg | PREFILLED_SYRINGE | INTRAMUSCULAR | Status: DC
  Administered 2023-02-17: 40 mg via SUBCUTANEOUS
  Filled 2023-02-16: qty 0.4

## 2023-02-16 MED ORDER — ACETAMINOPHEN 500 MG PO TABS
1000.0000 mg | ORAL_TABLET | Freq: Two times a day (BID) | ORAL | Status: DC
  Administered 2023-02-17 – 2023-02-18 (×3): 1000 mg via ORAL
  Filled 2023-02-16 (×3): qty 2

## 2023-02-16 MED ORDER — BUPIVACAINE HCL 0.25 % IJ SOLN
INTRAMUSCULAR | Status: DC | PRN
  Administered 2023-02-16: 20 mL

## 2023-02-16 MED ORDER — PROPOFOL 10 MG/ML IV BOLUS
INTRAVENOUS | Status: AC
  Filled 2023-02-16: qty 20

## 2023-02-16 MED ORDER — PROPOFOL 10 MG/ML IV BOLUS
INTRAVENOUS | Status: DC | PRN
  Administered 2023-02-16: 100 mg via INTRAVENOUS
  Administered 2023-02-16: 50 mg via INTRAVENOUS

## 2023-02-16 MED ORDER — LIDOCAINE 2% (20 MG/ML) 5 ML SYRINGE
INTRAMUSCULAR | Status: DC | PRN
Start: 2023-02-16 — End: 2023-02-16
  Administered 2023-02-16: 1.5 mg/kg/h via INTRAVENOUS

## 2023-02-16 MED ORDER — SUGAMMADEX SODIUM 200 MG/2ML IV SOLN
INTRAVENOUS | Status: DC | PRN
Start: 2023-02-16 — End: 2023-02-16
  Administered 2023-02-16: 261.2 mg via INTRAVENOUS

## 2023-02-16 MED ORDER — HEMOSTATIC AGENTS (NO CHARGE) OPTIME
TOPICAL | Status: DC | PRN
  Administered 2023-02-16: 1 via TOPICAL

## 2023-02-16 MED ORDER — ONDANSETRON HCL 4 MG/2ML IJ SOLN
INTRAMUSCULAR | Status: DC | PRN
  Administered 2023-02-16: 4 mg via INTRAVENOUS

## 2023-02-16 MED ORDER — HEPARIN SODIUM (PORCINE) 5000 UNIT/ML IJ SOLN
5000.0000 [IU] | INTRAMUSCULAR | Status: AC
  Administered 2023-02-16: 5000 [IU] via SUBCUTANEOUS
  Filled 2023-02-16: qty 1

## 2023-02-16 MED ORDER — CEFAZOLIN SODIUM-DEXTROSE 2-4 GM/100ML-% IV SOLN
2.0000 g | INTRAVENOUS | Status: AC
  Administered 2023-02-16: 2 g via INTRAVENOUS
  Filled 2023-02-16: qty 100

## 2023-02-16 MED ORDER — LACTATED RINGERS IV SOLN: INTRAVENOUS | Status: DC | PRN

## 2023-02-16 MED ORDER — BUPIVACAINE LIPOSOME 1.3 % IJ SUSP
INTRAMUSCULAR | Status: AC
  Filled 2023-02-16: qty 20

## 2023-02-16 MED ORDER — CARMEX CLASSIC LIP BALM EX OINT
TOPICAL_OINTMENT | CUTANEOUS | Status: AC
  Filled 2023-02-16: qty 10

## 2023-02-16 MED ORDER — ONDANSETRON HCL 4 MG/2ML IJ SOLN
4.0000 mg | Freq: Four times a day (QID) | INTRAMUSCULAR | Status: DC | PRN
  Administered 2023-02-17 (×2): 4 mg via INTRAVENOUS
  Filled 2023-02-16 (×2): qty 2

## 2023-02-16 MED ORDER — KCL IN DEXTROSE-NACL 20-5-0.45 MEQ/L-%-% IV SOLN
INTRAVENOUS | Status: DC
  Filled 2023-02-16 (×2): qty 1000

## 2023-02-16 MED ORDER — BUPIVACAINE LIPOSOME 1.3 % IJ SUSP
INTRAMUSCULAR | Status: DC | PRN
  Administered 2023-02-16: 20 mL

## 2023-02-16 MED ORDER — CHLORHEXIDINE GLUCONATE 0.12 % MT SOLN
15.0000 mL | Freq: Once | OROMUCOSAL | Status: AC
  Administered 2023-02-16: 15 mL via OROMUCOSAL

## 2023-02-16 MED ORDER — DEXAMETHASONE SODIUM PHOSPHATE 4 MG/ML IJ SOLN
4.0000 mg | INTRAMUSCULAR | Status: AC
  Administered 2023-02-16: 8 mg via INTRAVENOUS

## 2023-02-16 SURGICAL SUPPLY — 121 items
ADH SKN CLS APL DERMABOND .7 (GAUZE/BANDAGES/DRESSINGS) ×3
AGENT HMST KT MTR STRL THRMB (HEMOSTASIS) ×3
APL ESCP 34 STRL LF DISP (HEMOSTASIS) ×3
APL PRP STRL LF DISP 70% ISPRP (MISCELLANEOUS) ×3
APPLICATOR SURGIFLO ENDO (HEMOSTASIS) IMPLANT
BAG COUNTER SPONGE SURGICOUNT (BAG) IMPLANT
BAG LAPAROSCOPIC 12 15 PORT 16 (BASKET) IMPLANT
BAG RETRIEVAL 12/15 (BASKET)
BAG SPNG CNTER NS LX DISP (BAG) ×3
BINDER ABDOMINAL 12 ML 46-62 (SOFTGOODS) IMPLANT
BLADE SURG 15 STRL LF DISP TIS (BLADE) IMPLANT
BLADE SURG 15 STRL SS (BLADE) ×3
BLADE SURG SZ10 CARB STEEL (BLADE) IMPLANT
BNDG GAUZE DERMACEA FLUFF 4 (GAUZE/BANDAGES/DRESSINGS) IMPLANT
BNDG GZE DERMACEA 4 6PLY (GAUZE/BANDAGES/DRESSINGS)
CHLORAPREP W/TINT 26 (MISCELLANEOUS) ×3 IMPLANT
CLIP LIGATING HEM O LOK PURPLE (MISCELLANEOUS) IMPLANT
CLIP LIGATING HEMO O LOK GREEN (MISCELLANEOUS) IMPLANT
CLIP TI LARGE 6 (CLIP) IMPLANT
CLIP TI MEDIUM 6 (CLIP) ×3 IMPLANT
CLIP TI MEDIUM LARGE 6 (CLIP) IMPLANT
COVER BACK TABLE 60X90IN (DRAPES) ×3 IMPLANT
COVER SURGICAL LIGHT HANDLE (MISCELLANEOUS) ×3 IMPLANT
COVER TIP SHEARS 8 DVNC (MISCELLANEOUS) ×3 IMPLANT
DERMABOND ADVANCED .7 DNX12 (GAUZE/BANDAGES/DRESSINGS) ×3 IMPLANT
DRAIN CHANNEL 10F 3/8 F FF (DRAIN) IMPLANT
DRAIN RELI 100 BL SUC LF ST (DRAIN)
DRAPE ARM DVNC X/XI (DISPOSABLE) ×12 IMPLANT
DRAPE COLUMN DVNC XI (DISPOSABLE) ×3 IMPLANT
DRAPE LAPAROTOMY T 98X78 PEDS (DRAPES) ×3 IMPLANT
DRAPE SHEET LG 3/4 BI-LAMINATE (DRAPES) ×3 IMPLANT
DRAPE SURG IRRIG POUCH 19X23 (DRAPES) ×3 IMPLANT
DRAPE UNDERBUTTOCKS STRL (DISPOSABLE) ×3 IMPLANT
DRAPE UTILITY XL STRL (DRAPES) ×3 IMPLANT
DRIVER NDL MEGA SUTCUT DVNCXI (INSTRUMENTS) ×3 IMPLANT
DRIVER NDLE MEGA SUTCUT DVNCXI (INSTRUMENTS) ×3
DRSG OPSITE POSTOP 4X6 (GAUZE/BANDAGES/DRESSINGS) IMPLANT
DRSG OPSITE POSTOP 4X8 (GAUZE/BANDAGES/DRESSINGS) IMPLANT
DRSG TEGADERM 4X4.75 (GAUZE/BANDAGES/DRESSINGS) IMPLANT
DRSG TELFA 3X8 NADH STRL (GAUZE/BANDAGES/DRESSINGS) IMPLANT
ELECT PENCIL ROCKER SW 15FT (MISCELLANEOUS) IMPLANT
ELECT REM PT RETURN 15FT ADLT (MISCELLANEOUS) ×3 IMPLANT
EVACUATOR SILICONE 100CC (DRAIN) IMPLANT
FORCEPS BPLR FENES DVNC XI (FORCEP) ×3 IMPLANT
FORCEPS PROGRASP DVNC XI (FORCEP) ×3 IMPLANT
GAUZE 4X4 16PLY ~~LOC~~+RFID DBL (SPONGE) ×3 IMPLANT
GLOVE BIO SURGEON STRL SZ 6 (GLOVE) ×12 IMPLANT
GLOVE BIO SURGEON STRL SZ 6.5 (GLOVE) ×3 IMPLANT
GLOVE BIOGEL PI IND STRL 6.5 (GLOVE) ×6 IMPLANT
GOWN STRL REUS W/ TWL LRG LVL3 (GOWN DISPOSABLE) ×12 IMPLANT
GOWN STRL REUS W/TWL LRG LVL3 (GOWN DISPOSABLE) ×15
GRASPER SUT TROCAR 14GX15 (MISCELLANEOUS) IMPLANT
HOLDER FOLEY CATH W/STRAP (MISCELLANEOUS) IMPLANT
IRRIG SUCT STRYKERFLOW 2 WTIP (MISCELLANEOUS) ×3
IRRIGATION SUCT STRKRFLW 2 WTP (MISCELLANEOUS) ×3 IMPLANT
KIT BASIN OR (CUSTOM PROCEDURE TRAY) ×3 IMPLANT
KIT TURNOVER KIT A (KITS) IMPLANT
LEGGING LITHOTOMY PAIR STRL (DRAPES) ×3 IMPLANT
LIGASURE IMPACT 36 18CM CVD LR (INSTRUMENTS) IMPLANT
MANIPULATOR ADVINCU DEL 3.0 PL (MISCELLANEOUS) IMPLANT
MANIPULATOR ADVINCU DEL 3.5 PL (MISCELLANEOUS) IMPLANT
MANIPULATOR UTERINE 4.5 ZUMI (MISCELLANEOUS) IMPLANT
NDL HYPO 21X1.5 SAFETY (NEEDLE) ×3 IMPLANT
NDL HYPO 22X1.5 SAFETY MO (MISCELLANEOUS) ×3 IMPLANT
NDL INSUFFLATION 14GA 120MM (NEEDLE) IMPLANT
NDL SPNL 20GX3.5 QUINCKE YW (NEEDLE) IMPLANT
NEEDLE HYPO 21X1.5 SAFETY (NEEDLE) ×3
NEEDLE HYPO 22X1.5 SAFETY MO (MISCELLANEOUS) ×3
NEEDLE INSUFFLATION 14GA 120MM (NEEDLE)
NEEDLE SPNL 20GX3.5 QUINCKE YW (NEEDLE)
OBTURATOR OPTICAL STND 8 DVNC (TROCAR) ×3
OBTURATOR OPTICALSTD 8 DVNC (TROCAR) ×3 IMPLANT
PACK GENERAL/GYN (CUSTOM PROCEDURE TRAY) ×3 IMPLANT
PACK ROBOT GYN CUSTOM WL (TRAY / TRAY PROCEDURE) ×3 IMPLANT
PAD ARMBOARD 7.5X6 YLW CONV (MISCELLANEOUS) ×3 IMPLANT
PAD POSITIONING PINK XL (MISCELLANEOUS) ×3 IMPLANT
PORT ACCESS TROCAR AIRSEAL 12 (TROCAR) IMPLANT
SCISSORS MNPLR CVD DVNC XI (INSTRUMENTS) ×3 IMPLANT
SCRUB CHG 4% DYNA-HEX 4OZ (MISCELLANEOUS) ×6 IMPLANT
SEAL UNIV 5-12 XI (MISCELLANEOUS) ×12 IMPLANT
SET TRI-LUMEN FLTR TB AIRSEAL (TUBING) ×3 IMPLANT
SHEARS HARMONIC 9CM CVD (BLADE) IMPLANT
SHEET LAVH (DRAPES) IMPLANT
SPIKE FLUID TRANSFER (MISCELLANEOUS) ×3 IMPLANT
SPONGE DRAIN TRACH 4X4 STRL 2S (GAUZE/BANDAGES/DRESSINGS) IMPLANT
SPONGE T-LAP 18X18 ~~LOC~~+RFID (SPONGE) IMPLANT
SURGIFLO W/THROMBIN 8M KIT (HEMOSTASIS) IMPLANT
SUT ETHILON 3 0 PS 1 (SUTURE) IMPLANT
SUT MNCRL AB 4-0 PS2 18 (SUTURE) ×3 IMPLANT
SUT PDS AB 1 TP1 54 (SUTURE) IMPLANT
SUT PROLENE 4 0 RB 1 (SUTURE) ×9
SUT PROLENE 4-0 RB1 .5 CRCL 36 (SUTURE) IMPLANT
SUT SILK 2 0 (SUTURE) ×3
SUT SILK 2-0 18XBRD TIE 12 (SUTURE) ×3 IMPLANT
SUT SILK 3 0 (SUTURE)
SUT SILK 3-0 18XBRD TIE 12 (SUTURE) IMPLANT
SUT VIC AB 0 CT1 27 (SUTURE)
SUT VIC AB 0 CT1 27XBRD ANTBC (SUTURE) IMPLANT
SUT VIC AB 2-0 CT1 27 (SUTURE) ×3
SUT VIC AB 2-0 CT1 TAPERPNT 27 (SUTURE) IMPLANT
SUT VIC AB 2-0 SH 18 (SUTURE) IMPLANT
SUT VIC AB 2-0 SH 27 (SUTURE)
SUT VIC AB 2-0 SH 27XBRD (SUTURE) ×3 IMPLANT
SUT VIC AB 4-0 PS2 18 (SUTURE) ×6 IMPLANT
SUT VIC AB 4-0 PS2 27 (SUTURE) IMPLANT
SUT VICRYL 0 27 CT2 27 ABS (SUTURE) IMPLANT
SUT VLOC 180 0 9IN GS21 (SUTURE) IMPLANT
SYR 10ML LL (SYRINGE) IMPLANT
SYR 20ML LL LF (SYRINGE) ×6 IMPLANT
SYS BAG RETRIEVAL 10MM (BASKET) ×3
SYS WOUND ALEXIS 18CM MED (MISCELLANEOUS)
SYSTEM BAG RETRIEVAL 10MM (BASKET) IMPLANT
SYSTEM WOUND ALEXIS 18CM MED (MISCELLANEOUS) IMPLANT
TOWEL OR 17X26 10 PK STRL BLUE (TOWEL DISPOSABLE) ×3 IMPLANT
TOWEL OR NON WOVEN STRL DISP B (DISPOSABLE) IMPLANT
TRAP SPECIMEN MUCUS 40CC (MISCELLANEOUS) IMPLANT
TRAY FOLEY MTR SLVR 16FR STAT (SET/KITS/TRAYS/PACK) ×3 IMPLANT
TROCAR PORT AIRSEAL 5X120 (TROCAR) IMPLANT
UNDERPAD 30X36 HEAVY ABSORB (UNDERPADS AND DIAPERS) ×6 IMPLANT
WATER STERILE IRR 1000ML POUR (IV SOLUTION) ×3 IMPLANT
YANKAUER SUCT BULB TIP 10FT TU (MISCELLANEOUS) IMPLANT

## 2023-02-16 NOTE — Anesthesia Preprocedure Evaluation (Addendum)
Anesthesia Evaluation  Patient identified by MRN, date of birth, ID band Patient awake    Reviewed: Allergy & Precautions, NPO status , Patient's Chart, lab work & pertinent test results  History of Anesthesia Complications (+) PONV and history of anesthetic complications  Airway Mallampati: II  TM Distance: >3 FB Neck ROM: Full    Dental no notable dental hx.    Pulmonary neg pulmonary ROS   Pulmonary exam normal        Cardiovascular negative cardio ROS Normal cardiovascular exam     Neuro/Psych negative neurological ROS  negative psych ROS   GI/Hepatic negative GI ROS, Neg liver ROS,,,  Endo/Other  negative endocrine ROS    Renal/GU negative Renal ROS  negative genitourinary   Musculoskeletal  (+) Arthritis ,    Abdominal   Peds  Hematology  (+) Blood dyscrasia (Hgb 11.8), anemia   Anesthesia Other Findings Day of surgery medications reviewed with patient.  Reproductive/Obstetrics negative OB ROS                              Anesthesia Physical Anesthesia Plan  ASA: 3  Anesthesia Plan: General   Post-op Pain Management: Tylenol PO (pre-op)*   Induction: Intravenous  PONV Risk Score and Plan: 4 or greater and Treatment may vary due to age or medical condition, Ondansetron, Dexamethasone, Midazolam, Propofol infusion and TIVA  Airway Management Planned: Oral ETT  Additional Equipment: None  Intra-op Plan:   Post-operative Plan: Extubation in OR  Informed Consent: I have reviewed the patients History and Physical, chart, labs and discussed the procedure including the risks, benefits and alternatives for the proposed anesthesia with the patient or authorized representative who has indicated his/her understanding and acceptance.     Dental advisory given  Plan Discussed with: CRNA  Anesthesia Plan Comments:         Anesthesia Quick Evaluation

## 2023-02-16 NOTE — Brief Op Note (Signed)
02/16/2023  9:08 PM  PATIENT:  [name]  72 y.o. 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  PROCEDURE:  Procedure(s): XI ROBOTIC ASSISTED TOTAL HYSTERECTOMY BILATERAL SALPINGO OOPHORECTOMY WITH OMENTECTOMY, RIGHT OBTURATOR NERVE REPAIR (Bilateral) RIGHT PELVIC LYMPH NODE DISSECTION (Right) MINI LAPAROTOMY (N/A)  SURGEON:  Surgeons and Role:    [name], MD - Primary    * Antionette Char, MD - Assisting  ANESTHESIA:   general  EBL:  150 mL   BLOOD ADMINISTERED:none  DRAINS: none   LOCAL MEDICATIONS USED:  BUPIVICAINE  and OTHER Exparel  SPECIMEN:   ID Type Source Tests Collected by Time Destination  1 : Uterus, cervix, bilateral tubes and ovaries Tissue PATH Gyn tumor resection SURGICAL PATHOLOGY [name], MD 02/16/2023 1808   2 : Right pelvic lymph node Tissue PATH Lymph node excision SURGICAL PATHOLOGY [name], MD 02/16/2023 1928   3 : Segment of obturator nerve Tissue Path Tissue SURGICAL PATHOLOGY [name], MD 02/16/2023 1943   4 : Omentum Tissue PATH GI tumor resection SURGICAL PATHOLOGY [name], MD 02/16/2023 1944     DISPOSITION OF SPECIMEN:  PATHOLOGY  COUNTS:  YES  TOURNIQUET:  * No tourniquets in log *  DICTATION: .Note written in EPIC  PLAN OF CARE: Admit for overnight observation  PATIENT DISPOSITION:  PACU - hemodynamically stable.   Delay start of Pharmacological VTE agent (>24hrs) due to surgical blood loss or risk of bleeding: no

## 2023-02-16 NOTE — Interval H&P Note (Signed)
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  The various methods of treatment have been discussed with the patient and family. After consideration of risks, benefits and other options for treatment, the patient has consented to  Procedure(s): XI ROBOTIC ASSISTED TOTAL HYSTERECTOMY BILATERAL SALPINGO OOPHORECTOMY WITH OMENTECTOMY (Bilateral) POSSIBLE RIGHT PELVIC LYMPH NODE DISSECTION (Right) POSSIBLE RIGHT INGUINAL LYMPHADENECTOMY (Right) MINI LAPAROTOMY (N/A) as a surgical intervention.  The patient's history has been reviewed, patient examined, no change in status, stable for surgery.  I have reviewed the patient's chart and labs.  Questions were answered to the patient's satisfaction.     Jeffie Spivack

## 2023-02-16 NOTE — Discharge Instructions (Signed)
AFTER SURGERY INSTRUCTIONS   Return to work: 4-6 weeks if applicable  Plan on having physical therapy after surgery to assist with strengthening the right leg.  Plan on restarting Eliquis tomorrow (Aug 23) in the evening.    Activity: 1. Be up and out of the bed during the day.  Take a nap if needed.  You may walk up steps but be careful and use the hand rail.  Stair climbing will tire you more than you think, you may need to stop part way and rest.    2. No lifting or straining for 6 weeks over 10 pounds. No pushing, pulling, straining for 6 weeks.   3. No driving for around 1 week(s).  Do not drive if you are taking narcotic pain medicine and make sure that your reaction time has returned.    4. You can shower as soon as the next day after surgery. Shower daily.  Use your regular soap and water (not directly on the incision) and pat your incision(s) dry afterwards; don't rub.  No tub baths or submerging your body in water until cleared by your surgeon. If you have the soap that was given to you by pre-surgical testing that was used before surgery, you do not need to use it afterwards because this can irritate your incisions.    5. No sexual activity and nothing in the vagina for 12 weeks.   6. You may experience a small amount of clear drainage from your incisions, which is normal.  If the drainage persists, increases, or changes color please call the office.   7. Do not use creams, lotions, or ointments such as neosporin on your incisions after surgery until advised by your surgeon because they can cause removal of the dermabond glue on your incisions.     8. You may experience vaginal spotting after surgery or when the stitches at the top of the vagina begin to dissolve.  The spotting is normal but if you experience heavy bleeding, call our office.   9. Take Tylenol first for pain if you are able to take these medication and only use narcotic pain medication for severe pain not relieved  by the Tylenol.  Monitor your Tylenol intake to a max of 4,000 mg in a 24 hour period.   Diet: 1. Low sodium Heart Healthy Diet is recommended but you are cleared to resume your normal (before surgery) diet after your procedure.   2. It is safe to use a laxative, such as Miralax or Colace, if you have difficulty moving your bowels. You have been prescribed Sennakot-S to take at bedtime every evening after surgery to keep bowel movements regular and to prevent constipation.     Wound Care: 1. Keep clean and dry.  Shower daily.   Reasons to call the Doctor: Fever - Oral temperature greater than 100.4 degrees Fahrenheit Foul-smelling vaginal discharge Difficulty urinating Nausea and vomiting Increased pain at the site of the incision that is unrelieved with pain medicine. Difficulty breathing with or without chest pain New calf pain especially if only on one side Sudden, continuing increased vaginal bleeding with or without clots.   Contacts: For questions or concerns you should contact:   Dr. [name] at [phone]   Warner Mccreedy, NP at [phone]   After Hours: call [phone] and have the GYN Oncologist paged/contacted (after 5 pm or on the weekends). You will speak with an after hours RN and let he or she know you have had surgery.  Messages sent via mychart are for non-urgent matters and are not responded to after hours so for urgent needs, please call the after hours number.

## 2023-02-16 NOTE — Transfer of Care (Signed)
Immediate Anesthesia Transfer of Care Note  Patient: [name]  Procedure(s) Performed: XI ROBOTIC ASSISTED TOTAL HYSTERECTOMY BILATERAL SALPINGO OOPHORECTOMY WITH OMENTECTOMY, RIGHT OBTURATOR NERVE REPAIR (Bilateral) RIGHT PELVIC LYMPH NODE DISSECTION (Right) MINI LAPAROTOMY  Patient Location: PACU  Anesthesia Type:General  Level of Consciousness: drowsy, patient cooperative, and responds to stimulation  Airway & Oxygen Therapy: Patient Spontanous Breathing and Patient connected to face mask oxygen  Post-op Assessment: Report given to RN, Post -op Vital signs reviewed and stable, and Patient moving all extremities X 4  Post vital signs: Reviewed and stable  Last Vitals:  Vitals Value Taken Time  BP 130/94 02/16/23 2115  Temp    Pulse 65 02/16/23 2118  Resp 13 02/16/23 2118  SpO2 100 % 02/16/23 2118  Vitals shown include unfiled device data.  Last Pain:  Vitals:   02/16/23 1150  TempSrc:   PainSc: 0-No pain         Complications: No notable events documented.

## 2023-02-16 NOTE — Op Note (Signed)
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   Postoperative Diagnosis: Same, obturator nerve injury and repair  Procedures: Robotic-assisted total laparoscopic hysterectomy, bilateral salpingo-oophorectomy, tumor debulking of right pelvic lymph node and minilaparotomy for omentectomy, repair of right obturator nerve  Surgeon: [name], MD  Assistants: Antionette Char, MD and (an MD assistant was necessary for tissue manipulation, management of robotic instrumentation, retraction and positioning due to the complexity of the case and hospital policies)  Anesthesia: General  Estimated Blood Loss: 150 mL    Fluids: 1800 ml, crystalloid  Urine Output: 250 ml, clear yellow  Findings: On bimanual exam, small mobile uterus, no adnexal mass. No palpable right lymphadenopathy. On entry to abdomen, normal upper abdominal survey with smooth diaphragm, liver, stomach and normal appearing omentum and bowel. Nodule in the gallbladder fossa (image in media). In the pelvis, filmy adhesions of the sigmoid colon to the left pelvic sidewall. Otherwise small, normal appearing uterus and bilateral fallopian tubes and ovaries. Right pelvic sidewall with enlarged lymph node, approximately 4x2cm, densely adherent to the pelvic sidewall, external iliac vein. Densely adherent and encasing the obturator nerve and artery. With careful dissection, unavoidable avulsion injury occurred to the obturator artery. Due to the densely adherent nature of the lymph node, the nerve and artery were initially inseparable. In obtaining control of the bleeding from the obturator artery, unavoidable crush and cautery injury to the obturator nerve. Following removal of the involved node, the affected portion of the obturator nerve was resected and well reapproximated. No other gross evidence of disease in the abdomen or pelvis.    Specimens:  ID Type Source Tests Collected by Time Destination  1 : Uterus, cervix, bilateral tubes and ovaries Tissue PATH Gyn tumor resection SURGICAL PATHOLOGY [name], MD 02/16/2023 1808   2 : Right pelvic lymph node Tissue PATH Lymph node excision SURGICAL PATHOLOGY [name], MD 02/16/2023 1928   3 : Segment of obturator nerve Tissue Path Tissue SURGICAL PATHOLOGY [name], MD 02/16/2023 1943   4 : Omentum Tissue PATH GI tumor resection SURGICAL PATHOLOGY [name], MD 02/16/2023 1944     Complications:  None  Indications for Procedure: [name] is a 72 y.o. woman with poorly differentiated carcinoma, diagnosed on pelvic lymph node biopsy, now s/p 3 cycles of neoadjuvant chemotherapy, here for interval debulking surgery.  Prior to the procedure, all risks, benefits, and alternatives were discussed and informed surgical consent was signed.  Procedure: Patient was taken to the operating room where general anesthesia was achieved.  She was positioned in dorsal lithotomy and prepped and draped.  A foley catheter was inserted into the bladder. The cervix was dilated and an Advincula uterine manipulator with a colpotomy ring was inserted into the uterus.  A 5 mm incision was made in the left upper quadrant near Palmer's point.  The abdomen was entered with a 5 mm OptiView trocar under direct visualization.  The abdomen was insufflated, the patient placed in steep Trendelenburg, and additional trocars were placed as follows: an 8mm trocar superior to the umbilicus, two 8 mm robotic trocars in the right abdomen, and one 8 mm robotic trocar in the left abdomen.  The left upper quadrant trocar was removed and replaced with a 5 mm airseal trocar.  All trocars were placed under direct visualization.  The bowels were moved into the upper abdomen.  The DaVinci robotic surgical system was brought to the patient's bedside  and docked.  The right round ligament was  transected and the retroperitoneum entered.  The right ureter was identified. The right infundibulopelvic ligament was isolated, cauterized, and transected. The posterior peritoneum was opened to the KOH ring.  The anterior peritoneum was opened and the bladder flap was created.  The right uterine artery was skeletonized, cauterized, and transected at the level of the KOH ring. Additional cautery was used in a C-shaped fashion to allow the remainder of the broad, cardinal, and uterosacral ligaments with the uterine vessels to be transected and fall away from the KOH ring. A similar procedure was performed on the left side. Filmy adhesions of the sigmoid to the left pelvic sidewall were lysed sharply. A colpotomy was made circumferentially following the contours of the KOH ring.  The uterine specimen was removed through the vagina.  The vaginal cuff was closed with a running stitch of 0 Vicryl suture.   At this time, attention was returned to the right pelvic sidewall.  The pararectal and paravesical spaces were opened meticulously.  The retroperitoneal space was densely matted.  An enlarged lymph node was identified adherent on the caudal surface of the external iliac vein, adherent to the pelvic sidewall and filling the obturator space.  Dissection was performed sharply and bluntly, involving short bursts of cautery to serially dissected the node from the caudal surface of the external iliac vein, proximally from the bifurcation of the internal/external iliac vein and distally to the branching of the circumflex vein.  The caudal surface of the lymph node was densely adherent to and encasing the obturator nerve and obturator artery. With careful dissection, unavoidable avulsion injury occurred to the obturator artery. Due to the densely adherent nature of the lymph node, the nerve and artery were initially inseparable. In obtaining control of the bleeding from the obturator artery, unavoidable crush and cautery  injury occurred to the obturator nerve.  The lymph node was further mobilized from around the obturator nerve and artery.  The obturator nerve and artery were then dissected free from one another.  With the obturator artery isolated, the artery was clipped with vessel clips proximal and distal to the injury.  Additional dissection to mobilize the node off of the artery and pelvic sidewall occurred until the lymph node was completely free.  The lymph node was then placed in an Endo Catch bag for later retrieval.  Given the nature of the injury to the obturator nerve, decision was made to resect this portion of nerve and reapproximate the healthy portions of the nerve.  The affected area was excised sharply with scissors.  The nerve was then reapproximated circumferentially with 4 stitches of 4-0 Prolene.  The pelvis was irrigated and all operative sites were found to be hemostatic. Floseal was placed in the right pelvic sidewall lymph node bed. All instruments were removed and the robot was taken from the patient's bedside.   The robotic trocar at the supraumbilical incision was removed. This incision was extended to approximately 4 to 5 cm.  The abdomen was entered sharply.The abdomen was desufflated and all ports were removed. The omentum was grasped and elevated out of the incision. An area superior to the transverse colon was incised and the lesser sac was entered.  Careful dissection was done to remove the omentum from the length of the transverse colon with electrocautery. Pedicles were made in the omentum at the level of the transverse colon. These were cauterized and transected with Ligasure cautery. Hemostasis was noted at  all pedicles.   The fascia at the supraumbilical incision was closed with a running stitch#1 PDS. Exparel was injected at the incision sites in standard fashion. The subcutaneous tissues were irrigated and hemostasis achieved. The subcutaneous space was approximated with 2-0 Vicryl  in a running fashion.  A deep dermal stitch was placed with 4-0 Vicryl. The skin was closed with 4-0 monocryl in a subcuticular fashion followed by surgical glue. The skin at the laparoscopic incisions was closed with 4-0 Vicryl to reapproximate the subcutaneous tissue and 4-0 monocryl in a subcuticular fashion followed by surgical glue.   Patient tolerated the procedure well. Sponge, lap, and instrument counts were correct.  Patient received 2 gm of Ancef prior to skin incision for routine perioperative antibiotic prophylaxis.  She was extubated and taken to the PACU in stable condition.  [name], MD Gynecologic Oncology

## 2023-02-16 NOTE — Anesthesia Procedure Notes (Signed)
Procedure Name: Intubation Date/Time: 02/16/2023 5:41 PM  Performed by: Deri Fuelling, CRNAPre-anesthesia Checklist: Patient identified, Emergency Drugs available, Suction available and Patient being monitored Patient Re-evaluated:Patient Re-evaluated prior to induction Oxygen Delivery Method: Circle system utilized Preoxygenation: Pre-oxygenation with 100% oxygen Induction Type: IV induction Ventilation: Mask ventilation without difficulty Laryngoscope Size: Mac and 3 Grade View: Grade I Tube type: Oral Tube size: 7.0 mm Number of attempts: 1 Airway Equipment and Method: Stylet and Oral airway Placement Confirmation: ETT inserted through vocal cords under direct vision, positive ETCO2 and breath sounds checked- equal and bilateral Secured at: 21 cm Tube secured with: Tape Dental Injury: Teeth and Oropharynx as per pre-operative assessment

## 2023-02-17 ENCOUNTER — Encounter (HOSPITAL_COMMUNITY): Payer: Self-pay | Admitting: Psychiatry

## 2023-02-17 DIAGNOSIS — C801 Malignant (primary) neoplasm, unspecified: Secondary | ICD-10-CM

## 2023-02-17 DIAGNOSIS — Z9889 Other specified postprocedural states: Secondary | ICD-10-CM

## 2023-02-17 DIAGNOSIS — R110 Nausea: Secondary | ICD-10-CM

## 2023-02-17 LAB — CBC
HCT: 31.5 % — ABNORMAL LOW (ref 36.0–46.0)
Hemoglobin: 10.5 g/dL — ABNORMAL LOW (ref 12.0–15.0)
MCH: 30.5 pg (ref 26.0–34.0)
MCHC: 33.3 g/dL (ref 30.0–36.0)
MCV: 91.6 fL (ref 80.0–100.0)
Platelets: 146 10*3/uL — ABNORMAL LOW (ref 150–400)
RBC: 3.44 MIL/uL — ABNORMAL LOW (ref 3.87–5.11)
RDW: 14 % (ref 11.5–15.5)
WBC: 9.4 10*3/uL (ref 4.0–10.5)
nRBC: 0 % (ref 0.0–0.2)

## 2023-02-17 LAB — BASIC METABOLIC PANEL
Anion gap: 9 (ref 5–15)
BUN: 16 mg/dL (ref 8–23)
CO2: 25 mmol/L (ref 22–32)
Calcium: 8.9 mg/dL (ref 8.9–10.3)
Chloride: 98 mmol/L (ref 98–111)
Creatinine, Ser: 0.73 mg/dL (ref 0.44–1.00)
GFR, Estimated: >60 mL/min (ref >60)
Glucose, Bld: 187 mg/dL — ABNORMAL HIGH (ref 70–99)
Potassium: 4.2 mmol/L (ref 3.5–5.1)
Sodium: 132 mmol/L — ABNORMAL LOW (ref 135–145)

## 2023-02-17 MED ORDER — POTASSIUM CHLORIDE 2 MEQ/ML IV SOLN
INTRAVENOUS | Status: DC
  Filled 2023-02-17 (×3): qty 1000

## 2023-02-17 MED ORDER — SCOPOLAMINE 1 MG/3DAYS TD PT72
1.0000 | MEDICATED_PATCH | TRANSDERMAL | Status: DC
  Administered 2023-02-17: 1.5 mg via TRANSDERMAL
  Filled 2023-02-17: qty 1

## 2023-02-17 MED ORDER — CHLORHEXIDINE GLUCONATE CLOTH 2 % EX PADS
6.0000 | MEDICATED_PAD | Freq: Every day | CUTANEOUS | Status: DC
  Administered 2023-02-17 – 2023-02-18 (×2): 6 via TOPICAL

## 2023-02-17 NOTE — Progress Notes (Signed)
1 Day Post-Op Procedure(s) (LRB): XI ROBOTIC ASSISTED TOTAL HYSTERECTOMY BILATERAL SALPINGO OOPHORECTOMY WITH OMENTECTOMY, RIGHT OBTURATOR NERVE REPAIR (Bilateral) RIGHT PELVIC LYMPH NODE DISSECTION (Right) MINI LAPAROTOMY (N/A)  Subjective: Patient reports having significant nausea overnight and for the majority of the day. She was able to eat mashed potatoes and reports feeling slightly better after keeping that down. Minimal abdominal discomfort. No emesis but patient states she came close to that point. She worked with PT. Reports weakness in the right leg. She sat in the chair today but was limited due to nausea. Denies chest pain, dyspnea, flatus.  Objective: Vital signs in last 24 hours: Temp:  [97 F (36.1 C)-98.3 F (36.8 C)] 97.8 F (36.6 C) (08/21 0849) Pulse Rate:  [59-66] 60 (08/21 0849) Resp:  [14-18] 18 (08/21 0530) BP: (108-135)/(66-94) 116/71 (08/21 0849) SpO2:  [93 %-100 %] 96 % (08/21 0849) Weight:  [144 lb (65.3 kg)] 144 lb (65.3 kg) (08/20 1150) Last BM Date : 02/16/23  Intake/Output from previous day: 08/20 0701 - 08/21 0700 In: 2215.2 [I.V.:2115.2; IV Piggyback:100] Out: 1050 [Urine:900; Blood:150]  Physical Examination: General: alert, cooperative, and no distress Resp: clear to auscultation bilaterally Cardio: regular rate and rhythm, S1, S2 normal, no murmur, click, rub or gallop GI: incision: incisions to abdomen with dermabond intact with no drainage or active bleeding, binder in place and abdomen soft, non-distended, hypoactive bowel sounds Extremities: extremities normal, atraumatic, no cyanosis or edema Foley in place with clear, yellow urine. 350 cc in the bag  Labs: WBC/Hgb/Hct/Plts:  9.4/10.5/31.5/146 (08/21 0451) BUN/Cr/glu/ALT/AST/amyl/lip:  16/0.73/--/--/--/--/-- (08/21 0451)  Assessment: 72 y.o. s/p Procedure(s): XI ROBOTIC ASSISTED TOTAL HYSTERECTOMY BILATERAL SALPINGO OOPHORECTOMY WITH OMENTECTOMY, RIGHT OBTURATOR NERVE REPAIR, RIGHT  PELVIC LYMPH NODE DISSECTION, MINI LAPAROTOMY: stable Pain:  Pain is well-controlled on PRN medications.  Heme: Hgb 10.5 and Hct 31.5 this am. Plan for repeat CBC in the am.   ID: WBC 9.4. Given decadron and intra-op abx. No signs of infection.  CV: BP and HR stable. Continue to monitor with ordered vital signs.  GI:  Tolerating po: limited due to nausea. Antiemetics ordered. Diet as tolerated.  GU: Foley in place. Creatinine 0.73 this am. No output documented since midnight. Per RN, it was around 650 cc. Continue to monitor. Will plan on keeping foley in overnight with removal in the am.   FEN: No critical values on am labs. Plan for repeat in the am.  Prophylaxis: SCD wraps on this am with no machine in the room. Machine is present now. Lovenox ordered.  Plan: Repeat am labs Diet as tolerated Maintain foley overnight with removal in the am. Strict intake and output. Appreciate PT eval. Will order rolling walker. Continue IVF due to nausea Plan for possible discharge in 1-2 days if nausea improves, meeting milestones   LOS: 1 day    Doylene Bode 02/17/2023, 9:04 AM

## 2023-02-17 NOTE — Progress Notes (Signed)
   02/17/23 1005  TOC Brief Assessment  Insurance and Status Reviewed  Patient has primary care physician Yes  Home environment has been reviewed home with spouse  Prior level of function: independent  Prior/Current Home Services No current home services  Social Determinants of Health Reivew SDOH reviewed no interventions necessary  Readmission risk has been reviewed Yes  Transition of care needs no transition of care needs at this time

## 2023-02-17 NOTE — Plan of Care (Signed)
  Problem: Education: Goal: Knowledge of the prescribed therapeutic regimen will improve Outcome: Progressing   

## 2023-02-17 NOTE — Evaluation (Signed)
Physical Therapy Evaluation Patient Details Name: [name] MRN: [id number] DOB: May 31, 1951 Today's Date: 02/17/2023  History of Present Illness  Pt is a 72 y/o F admitted on 02/16/23 for Robotic assisted total laparoscopic hysterectomy, bilateral salpingo-oophorectomy, pelvic lymph node dissection, possible inguinofemoral lymph node dissection, minilaparotomy for omentectomy debulking. PMH: arthritis, cataract, DDD, hypothyroidism, SCC  Clinical Impression  Pt seen for PT evaluation with pt agreeable, spouse present for session. Prior to admission pt was independent without AD, working & driving. Pt received with abdominal binder already on, pt with c/o ongoing nausea. PT educates pt on log rolling to reduce abdominal discomfort with bed mobility but pt requires assistance to position RLE for rolling. Educated pt & spouse on use of leg lifter for bed mobility 2/2 slow healing nerve injury. Pt is able to complete STS & stand pivot with RW & CGA but declines further gait 2/2 nausea. Will continue to follow pt acutely to progress mobility as able.        If plan is discharge home, recommend the following: A little help with walking and/or transfers;A little help with bathing/dressing/bathroom;Assistance with cooking/housework;Assist for transportation;Help with stairs or ramp for entrance   Can travel by private vehicle        Equipment Recommendations Rolling walker (2 wheels) (leg lifter)  Recommendations for Other Services       Functional Status Assessment Patient has had a recent decline in their functional status and demonstrates the ability to make significant improvements in function in a reasonable and predictable amount of time.     Precautions / Restrictions Precautions Precautions: Fall Precaution Comments: log roll for comfort, abdominal binder Restrictions Weight Bearing Restrictions: No      Mobility  Bed Mobility Overal bed mobility: Needs Assistance Bed  Mobility: Rolling, Sidelying to Sit Rolling: Min assist, Used rails (min assist to position RLE) Sidelying to sit: Supervision, Used rails       General bed mobility comments: PT educates pt on log rolling for increased abdominal comfort with bed mobility.    Transfers Overall transfer level: Needs assistance Equipment used: Rolling walker (2 wheels) Transfers: Sit to/from Stand, Bed to chair/wheelchair/BSC Sit to Stand: Contact guard assist   Step pivot transfers: Contact guard assist (bed>recliner on L with RW)       General transfer comment: STS from EOB, education re: hand placement to push to standing    Ambulation/Gait Ambulation/Gait assistance:  (pt politely declined 2/2 feeling nauseous)                Stairs            Wheelchair Mobility     Tilt Bed    Modified Rankin (Stroke Patients Only)       Balance Overall balance assessment: Needs assistance Sitting-balance support: Feet supported, Bilateral upper extremity supported, No upper extremity supported Sitting balance-Leahy Scale: Fair     Standing balance support: During functional activity, Bilateral upper extremity supported, Reliant on assistive device for balance Standing balance-Leahy Scale: Fair                               Pertinent Vitals/Pain Pain Assessment Pain Assessment: No/denies pain    Home Living Family/patient expects to be discharged to:: Private residence Living Arrangements: Spouse/significant other Available Help at Discharge: Family;Available 24 hours/day Type of Home: House Home Access: Stairs to enter Entrance Stairs-Rails: None Entrance Stairs-Number of Steps: 2 Alternate Level Stairs-Number  of Steps: 14 steps Home Layout: Two level;Bed/bath upstairs Home Equipment: Standard Walker;Cane - single point      Prior Function Prior Level of Function : Working/employed;Driving;Independent/Modified Independent             Mobility  Comments: 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, working at a school       Extremity/Trunk Assessment   Upper Extremity Assessment Upper Extremity Assessment: Overall WFL for tasks assessed    Lower Extremity Assessment Lower Extremity Assessment: Generalized weakness (pt unable to adduct RLE in bed)       Communication   Communication Communication: No apparent difficulties  Cognition Arousal: Alert Behavior During Therapy: WFL for tasks assessed/performed Overall Cognitive Status: Within Functional Limits for tasks assessed                                          General Comments      Exercises     Assessment/Plan    PT Assessment Patient needs continued PT services  PT Problem List Decreased strength;Decreased range of motion;Decreased activity tolerance;Decreased balance;Decreased mobility;Decreased safety awareness;Decreased knowledge of use of DME       PT Treatment Interventions Balance training;DME instruction;Gait training;Neuromuscular re-education;Stair training;Functional mobility training;Patient/family education;Therapeutic activities;Therapeutic exercise;Manual techniques    PT Goals (Current goals can be found in the Care Plan section)  Acute Rehab PT Goals Patient Stated Goal: get better, decrease nausea PT Goal Formulation: With patient/family Time For Goal Achievement: 03/03/23 Potential to Achieve Goals: Good    Frequency Min 1X/week     Co-evaluation               AM-PAC PT "6 Clicks" Mobility  Outcome Measure Help needed turning from your back to your side while in a flat bed without using bedrails?: A Little Help needed moving from lying on your back to sitting on the side of a flat bed without using bedrails?: A Little Help needed moving to and from a bed to a chair (including a wheelchair)?: A Little Help needed standing up from a chair using your arms (e.g., wheelchair or bedside chair)?: A Little Help needed to walk in  hospital room?: A Little Help needed climbing 3-5 steps with a railing? : A Lot 6 Click Score: 17    End of Session   Activity Tolerance: Patient tolerated treatment well (limited by nausea) Patient left: in chair;with call bell/phone within reach;with family/visitor present   PT Visit Diagnosis: Muscle weakness (generalized) (M62.81);Unsteadiness on feet (R26.81);Other abnormalities of gait and mobility (R26.89)    Time: 5409-8119 PT Time Calculation (min) (ACUTE ONLY): 21 min   Charges:   PT Evaluation $PT Eval Low Complexity: 1 Low   PT General Charges $$ ACUTE PT VISIT: 1 Visit         Aleda Grana, PT, DPT 02/17/23, 12:30 PM   Sandi Mariscal 02/17/2023, 12:28 PM

## 2023-02-17 NOTE — Progress Notes (Signed)
GYN Oncology Progress Note  Patient alert, oriented, resting in bed, talking with Dr. Alvester Morin on the phone. RN at the bedside. Will plan on re-visiting patient later today. PT eval ordered. Continue plan of care.

## 2023-02-18 LAB — HEMOGLOBIN AND HEMATOCRIT, BLOOD
HCT: 29.9 % — ABNORMAL LOW (ref 36.0–46.0)
Hemoglobin: 9.8 g/dL — ABNORMAL LOW (ref 12.0–15.0)

## 2023-02-18 LAB — BASIC METABOLIC PANEL
Anion gap: 5 (ref 5–15)
BUN: 8 mg/dL (ref 8–23)
CO2: 26 mmol/L (ref 22–32)
Calcium: 8.3 mg/dL — ABNORMAL LOW (ref 8.9–10.3)
Chloride: 107 mmol/L (ref 98–111)
Creatinine, Ser: 0.59 mg/dL (ref 0.44–1.00)
GFR, Estimated: >60 mL/min (ref >60)
Glucose, Bld: 113 mg/dL — ABNORMAL HIGH (ref 70–99)
Potassium: 3.8 mmol/L (ref 3.5–5.1)
Sodium: 138 mmol/L (ref 135–145)

## 2023-02-18 LAB — CBC
HCT: 28.5 % — ABNORMAL LOW (ref 36.0–46.0)
Hemoglobin: 9.4 g/dL — ABNORMAL LOW (ref 12.0–15.0)
MCH: 31.1 pg (ref 26.0–34.0)
MCHC: 33 g/dL (ref 30.0–36.0)
MCV: 94.4 fL (ref 80.0–100.0)
Platelets: 136 10*3/uL — ABNORMAL LOW (ref 150–400)
RBC: 3.02 MIL/uL — ABNORMAL LOW (ref 3.87–5.11)
RDW: 14.8 % (ref 11.5–15.5)
WBC: 4.5 10*3/uL (ref 4.0–10.5)
nRBC: 0 % (ref 0.0–0.2)

## 2023-02-18 LAB — SURGICAL PATHOLOGY

## 2023-02-18 MED ORDER — ENOXAPARIN SODIUM 40 MG/0.4ML IJ SOSY
40.0000 mg | PREFILLED_SYRINGE | Freq: Once | INTRAMUSCULAR | Status: AC
  Administered 2023-02-18: 40 mg via SUBCUTANEOUS
  Filled 2023-02-18: qty 0.4

## 2023-02-18 NOTE — Plan of Care (Signed)
Problem: Education: Goal: Knowledge of General Education information will improve Description: Including pain rating scale, medication(s)/side effects and non-pharmacologic comfort measures Outcome: Adequate for Discharge   Problem: Health Behavior/Discharge Planning: Goal: Ability to manage health-related needs will improve Outcome: Adequate for Discharge   Problem: Clinical Measurements: Goal: Ability to maintain clinical measurements within normal limits will improve Outcome: Adequate for Discharge Goal: Will remain free from infection Outcome: Adequate for Discharge Goal: Diagnostic test results will improve Outcome: Adequate for Discharge Goal: Respiratory complications will improve Outcome: Adequate for Discharge Goal: Cardiovascular complication will be avoided Outcome: Adequate for Discharge   Problem: Activity: Goal: Risk for activity intolerance will decrease Outcome: Adequate for Discharge   Problem: Nutrition: Goal: Adequate nutrition will be maintained Outcome: Adequate for Discharge   Problem: Coping: Goal: Level of anxiety will decrease Outcome: Adequate for Discharge   Problem: Elimination: Goal: Will not experience complications related to bowel motility Outcome: Adequate for Discharge Goal: Will not experience complications related to urinary retention Outcome: Adequate for Discharge   Problem: Pain Managment: Goal: General experience of comfort will improve Outcome: Adequate for Discharge   Problem: Safety: Goal: Ability to remain free from injury will improve Outcome: Adequate for Discharge   Problem: Skin Integrity: Goal: Risk for impaired skin integrity will decrease Outcome: Adequate for Discharge   Problem: Education: Goal: Knowledge of the prescribed therapeutic regimen will improve Outcome: Adequate for Discharge Goal: Understanding of sexual limitations or changes related to disease process or condition will improve Outcome: Adequate  for Discharge Goal: Individualized Educational Video(s) Outcome: Adequate for Discharge   Problem: Self-Concept: Goal: Communication of feelings regarding changes in body function or appearance will improve Outcome: Adequate for Discharge   Problem: Skin Integrity: Goal: Demonstration of wound healing without infection will improve Outcome: Adequate for Discharge   Problem: Education: Goal: Knowledge of the prescribed therapeutic regimen will improve Outcome: Adequate for Discharge Goal: Understanding of sexual limitations or changes related to disease process or condition will improve Outcome: Adequate for Discharge Goal: Individualized Educational Video(s) Outcome: Adequate for Discharge   Problem: Self-Concept: Goal: Communication of feelings regarding changes in body function or appearance will improve Outcome: Adequate for Discharge   Problem: Skin Integrity: Goal: Demonstration of wound healing without infection will improve Outcome: Adequate for Discharge   Problem: Education: Goal: Knowledge of General Education information will improve Description: Including pain rating scale, medication(s)/side effects and non-pharmacologic comfort measures Outcome: Adequate for Discharge   Problem: Health Behavior/Discharge Planning: Goal: Ability to manage health-related needs will improve Outcome: Adequate for Discharge   Problem: Clinical Measurements: Goal: Ability to maintain clinical measurements within normal limits will improve Outcome: Adequate for Discharge Goal: Will remain free from infection Outcome: Adequate for Discharge Goal: Diagnostic test results will improve Outcome: Adequate for Discharge Goal: Respiratory complications will improve Outcome: Adequate for Discharge Goal: Cardiovascular complication will be avoided Outcome: Adequate for Discharge   Problem: Activity: Goal: Risk for activity intolerance will decrease Outcome: Adequate for Discharge    Problem: Nutrition: Goal: Adequate nutrition will be maintained Outcome: Adequate for Discharge   Problem: Coping: Goal: Level of anxiety will decrease Outcome: Adequate for Discharge   Problem: Elimination: Goal: Will not experience complications related to bowel motility Outcome: Adequate for Discharge Goal: Will not experience complications related to urinary retention Outcome: Adequate for Discharge   Problem: Pain Managment: Goal: General experience of comfort will improve Outcome: Adequate for Discharge   Problem: Safety: Goal: Ability to remain free from injury will improve Outcome:  Adequate for Discharge   Problem: Skin Integrity: Goal: Risk for impaired skin integrity will decrease Outcome: Adequate for Discharge

## 2023-02-18 NOTE — Discharge Summary (Signed)
Physician Discharge Summary  Patient ID: [name] MRN: [id number] DOB/AGE: April 17, 1951 72 y.o.  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    Hospital Course: On 02/16/2023, the patient underwent the following: Procedure(s): XI ROBOTIC ASSISTED TOTAL HYSTERECTOMY BILATERAL SALPINGO OOPHORECTOMY WITH OMENTECTOMY, RIGHT OBTURATOR NERVE REPAIR RIGHT PELVIC LYMPH NODE DISSECTION MINI LAPAROTOMY. The postoperative course included post-operative nausea, physical therapy evaluation due to obturator nerve repair intraop.  She was discharged to home on postoperative day 2 tolerating a regular diet, having bowel movements, voiding, minimal pain, ambulating with a walker. Pt reporting mild discomfort sensation with ambulating in the right upper thigh and has challenges with adducting the right leg. Home health PT ordered.   Consults:  physical therapy  Significant Diagnostic Studies: Labs  Treatments: Surgery: see above  Discharge Exam: Blood pressure 122/68, pulse 63, temperature 98.7 F (37.1 C), temperature source Oral, resp. rate 18, weight 144 lb (65.3 kg), SpO2 100%. General appearance: alert, cooperative, and no distress Resp: clear to auscultation bilaterally Cardio: regular rate and rhythm, S1, S2 normal, no murmur, click, rub or gallop GI: soft, non-tender; bowel sounds normal; no masses,  no organomegaly Extremities: extremities normal, atraumatic, no cyanosis or edema Incision/Wound: Abdominal incisions intact with dermabond present. No erythema or drainage  Disposition: Discharge disposition: 06-Home-Health Care Svc       Discharge Instructions     Call MD for:  difficulty breathing, headache or visual  disturbances   Complete by: As directed    Call MD for:  extreme fatigue   Complete by: As directed    Call MD for:  hives   Complete by: As directed    Call MD for:  persistant dizziness or light-headedness   Complete by: As directed    Call MD for:  persistant nausea and vomiting   Complete by: As directed    Call MD for:  redness, tenderness, or signs of infection (pain, swelling, redness, odor or green/yellow discharge around incision site)   Complete by: As directed    Call MD for:  severe uncontrolled pain   Complete by: As directed    Call MD for:  temperature >100.4   Complete by: As directed    Diet - low sodium heart healthy   Complete by: As directed    Driving Restrictions   Complete by: As directed    No driving for 1 week(s).  Do not take narcotics and drive. You need to make sure your reaction time has returned.   Increase activity slowly   Complete by: As directed    Lifting restrictions   Complete by: As directed    No lifting greater than 10 lbs, pushing, pulling, straining for 6 weeks.   Sexual Activity Restrictions   Complete by: As directed    No sexual activity, nothing in the vagina, for 12 weeks.      Allergies as of 02/18/2023       Reactions   Sulfa Antibiotics Swelling        Medication List     TAKE these medications    acetaminophen 500 MG tablet Commonly known as: TYLENOL Take 1,000 mg by mouth every 6 (six) hours as needed for moderate pain.   dexamethasone 4 MG tablet Commonly known as: DECADRON Take 2  tabs at the night before and 2 tab the morning of chemotherapy, every 3 weeks, by mouth x 6 cycles   diclofenac sodium 1 % Gel Commonly known as: Voltaren Apply 2-4 g topically 4 (four) times daily. What changed:  when to take this reasons to take this   Eliquis 5 MG Tabs tablet Generic drug: apixaban Take 5 mg by mouth 2 (two) times daily. Notes to patient: PLAN TO RESTART February 18, 2022 IN THE EVENING   Glucosamine 500  MG Caps Take 500 mg by mouth 2 (two) times daily.   ibuprofen 200 MG tablet Commonly known as: ADVIL Take 400 mg by mouth daily as needed for moderate pain.   levothyroxine 75 MCG tablet Commonly known as: SYNTHROID Take 1 tablet (75 mcg total) by mouth daily.   lidocaine-prilocaine cream Commonly known as: EMLA Apply 1 Application topically as needed.   loratadine 10 MG tablet Commonly known as: CLARITIN Take 10 mg by mouth See admin instructions. Take 10 mg daily for 5 days starting on the day of chemo. May take 10 mg daily as needed for allergies   melatonin 5 MG Tabs Take 5 mg by mouth at bedtime as needed (sleep).   multivitamin tablet Take 1 tablet by mouth daily.   ondansetron 8 MG tablet Commonly known as: ZOFRAN Take 1 tablet (8 mg total) by mouth every 8 (eight) hours as needed for nausea.   oxyCODONE 5 MG immediate release tablet Commonly known as: Oxy IR/ROXICODONE Take 1 tablet (5 mg total) by mouth every 4 (four) hours as needed for severe pain. For AFTER surgery only, do not take and drive   prochlorperazine 10 MG tablet Commonly known as: COMPAZINE Take 1 tablet (10 mg total) by mouth every 6 (six) hours as needed for nausea or vomiting.   senna-docusate 8.6-50 MG tablet Commonly known as: Senokot-S Take 2 tablets by mouth at bedtime. For AFTER surgery, do not take if having diarrhea   THERATEARS OP Place 1 drop into both eyes 3 (three) times daily.   Tums 500 MG chewable tablet Generic drug: calcium carbonate Chew 1 tablet by mouth daily.   Vitamin D 50 MCG (2000 UT) tablet Take 2,000 Units by mouth daily.        Follow-up Information     [name], MD Follow up on 03/08/2023.   Specialty: Gynecologic Oncology Why: at 4:15pm at the [facility] information: [address] [place] [address] [phone]                 Greater than thirty minutes were spend for face to face discharge instructions and discharge  orders/summary in EPIC.   SignedDoylene Bode 02/19/2023, 4:04 PM

## 2023-02-18 NOTE — Anesthesia Postprocedure Evaluation (Signed)
Anesthesia Post Note  Patient: [name]  Procedure(s) Performed: XI ROBOTIC ASSISTED TOTAL HYSTERECTOMY BILATERAL SALPINGO OOPHORECTOMY WITH OMENTECTOMY, RIGHT OBTURATOR NERVE REPAIR (Bilateral) RIGHT PELVIC LYMPH NODE DISSECTION (Right) MINI LAPAROTOMY     Patient location during evaluation: PACU Anesthesia Type: General Level of consciousness: awake and alert Pain management: pain level controlled Vital Signs Assessment: post-procedure vital signs reviewed and stable Respiratory status: spontaneous breathing, nonlabored ventilation, respiratory function stable and patient connected to nasal cannula oxygen Cardiovascular status: blood pressure returned to baseline and stable Postop Assessment: no apparent nausea or vomiting Anesthetic complications: no   No notable events documented.  Last Vitals:  Vitals:   02/17/23 2112 02/18/23 0517  BP: 115/70 117/69  Pulse: 63 (!) 57  Resp: 15 15  Temp: 37.2 C 37.1 C  SpO2: 97% 97%    Last Pain:  Vitals:   02/18/23 0800  TempSrc:   PainSc: 0-No pain                 Taurus Willis S

## 2023-02-18 NOTE — Plan of Care (Signed)
  Problem: Health Behavior/Discharge Planning: Goal: Ability to manage health-related needs will improve Outcome: Progressing   Problem: Clinical Measurements: Goal: Ability to maintain clinical measurements within normal limits will improve Outcome: Progressing Goal: Will remain free from infection Outcome: Progressing   

## 2023-02-18 NOTE — TOC Transition Note (Signed)
Transition of Care [facility]) - CM/SW Discharge Note   Patient Details  Name: [name] MRN: [id number] Date of Birth: 25-Jan-1951  Transition of Care [facility]) CM/SW Contact:  Amada Jupiter, LCSW Phone Number: 02/18/2023, 1:53 PM   Clinical Narrative:    Met with pt who is agreeable with recommended HHPT follow up and need for RW.  No agency pref for DME - RW ordered with [facility] for delivery to room.  Pt does request [facility] for her HHPT with specific therapist if possible - referral placed/ accepted with [facility].  No further TOC needs.   Final next level of care: Home w Home Health Services Barriers to Discharge: No Barriers Identified   Patient Goals and CMS Choice      Discharge Placement                         Discharge Plan and Services Additional resources added to the After Visit Summary for                  DME Arranged: Walker rolling DME Agency: [facility] Date DME Agency Contacted: 02/18/23 Time DME Agency Contacted: 1352 Representative spoke with at DME Agency: Ian Malkin HH Arranged: PT HH Agency: [facility] Date [facility] Agency Contacted: 02/18/23 Time HH Agency Contacted: 1353 Representative spoke with at [facility] Agency: Tresa Endo  Social Determinants of Health (SDOH) Interventions SDOH Screenings   Food Insecurity: Low Risk  (12/17/2022)   [facility]  Housing: Low Risk  (10/30/2022)  Transportation Needs: No Transportation Needs (12/17/2022)   [facility]  Utilities: Low Risk  (12/17/2022)   [facility]  Depression (PHQ2-9): Low Risk  (10/30/2022)  Tobacco Use: Low Risk  (02/16/2023)     Readmission Risk Interventions    02/17/2023   10:05 AM  Readmission Risk Prevention Plan  Post Dischage Appt Complete  Medication Screening Complete  Transportation Screening Complete

## 2023-02-18 NOTE — Progress Notes (Signed)
Mobility Specialist - Progress Note   02/18/23 0851  Mobility  Activity Ambulated with assistance in hallway  Level of Assistance Standby assist, set-up cues, supervision of patient - no hands on  Assistive Device Front wheel walker  Distance Ambulated (ft) 265 ft  Activity Response Tolerated well  Mobility Referral Yes  $Mobility charge 1 Mobility  Mobility Specialist Start Time (ACUTE ONLY) [id number]  Mobility Specialist Stop Time (ACUTE ONLY) 0850  Mobility Specialist Time Calculation (min) (ACUTE ONLY) 18 min   Pt received in recliner and agreeable to mobility. C/o feeling weak in the beginning of the session, but still eager to ambulate. No other complaints during session. Pt to recliner after session with all needs met & nurse in room.   [facility]

## 2023-02-18 NOTE — Progress Notes (Signed)
GYN Oncology Progress Note  Patient is alert, oriented, sitting in the chair eating breakfast with husband by her side. In no acute distress. Reports feeling better. No nausea or emesis reported. Tolerating yogurt and grits this am. Had a BM. Due to void since foley removal. Ambulated in the hall with the walker. Reports weakness in the right leg but able to walk with walker, bear weight. No needs or concerns voiced at this time. Patient has been saline locked. Continue plan of care. Will see patient later today. If voiding and continuing to meet milestones plan for discharge home. Repeat H&H ordered to assess for stability.

## 2023-02-20 LAB — BPAM RBC
Blood Product Expiration Date: 202409152359
Blood Product Expiration Date: 202409152359
Unit Type and Rh: 5100
Unit Type and Rh: 5100

## 2023-02-20 LAB — TYPE AND SCREEN
ABO/RH(D): O POS
Antibody Screen: NEGATIVE
Unit division: 0
Unit division: 0

## 2023-02-23 ENCOUNTER — Other Ambulatory Visit: Payer: Self-pay | Admitting: Hematology and Oncology

## 2023-02-23 ENCOUNTER — Telehealth: Payer: Self-pay

## 2023-02-23 ENCOUNTER — Encounter: Payer: Self-pay | Admitting: Hematology and Oncology

## 2023-02-23 DIAGNOSIS — C55 Malignant neoplasm of uterus, part unspecified: Secondary | ICD-10-CM

## 2023-02-23 NOTE — Telephone Encounter (Signed)
Called and told her Dr. Bertis Ruddy is sending a scheduling message to resume chemo on 9/20 and told her I will forward her mychart message to Dr. Bertis Ruddy regarding her October beach trip. She verbalized understanding.

## 2023-03-04 ENCOUNTER — Encounter: Payer: Self-pay | Admitting: Hematology and Oncology

## 2023-03-08 ENCOUNTER — Inpatient Hospital Stay: Payer: Medicare PPO | Attending: Gynecologic Oncology | Admitting: Psychiatry

## 2023-03-08 VITALS — BP 107/68 | HR 70 | Temp 98.6°F | Resp 16 | Wt 145.4 lb

## 2023-03-08 DIAGNOSIS — Z90722 Acquired absence of ovaries, bilateral: Secondary | ICD-10-CM | POA: Insufficient documentation

## 2023-03-08 DIAGNOSIS — Z90710 Acquired absence of both cervix and uterus: Secondary | ICD-10-CM | POA: Diagnosis not present

## 2023-03-08 DIAGNOSIS — Z7189 Other specified counseling: Secondary | ICD-10-CM

## 2023-03-08 DIAGNOSIS — C801 Malignant (primary) neoplasm, unspecified: Secondary | ICD-10-CM | POA: Insufficient documentation

## 2023-03-08 DIAGNOSIS — C549 Malignant neoplasm of corpus uteri, unspecified: Secondary | ICD-10-CM

## 2023-03-08 DIAGNOSIS — C778 Secondary and unspecified malignant neoplasm of lymph nodes of multiple regions: Secondary | ICD-10-CM | POA: Insufficient documentation

## 2023-03-08 DIAGNOSIS — R590 Localized enlarged lymph nodes: Secondary | ICD-10-CM

## 2023-03-08 DIAGNOSIS — I82411 Acute embolism and thrombosis of right femoral vein: Secondary | ICD-10-CM

## 2023-03-08 DIAGNOSIS — R600 Localized edema: Secondary | ICD-10-CM

## 2023-03-08 DIAGNOSIS — Z9221 Personal history of antineoplastic chemotherapy: Secondary | ICD-10-CM | POA: Diagnosis not present

## 2023-03-08 MED ORDER — GABAPENTIN 300 MG PO CAPS: 300.0000 mg | ORAL_CAPSULE | Freq: Every day | ORAL | 3 refills | Status: DC

## 2023-03-08 NOTE — Progress Notes (Unsigned)
Gynecologic Oncology Return Clinic Visit  Date of Service: 03/08/2023 Referring Provider: [name], MD  [facility]  Assessment & Plan: [name] is a 72 y.o. 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  Postop: - Pt recovering well from surgery and healing appropriately postoperatively - Intraoperative findings and pathology results reviewed. - Ongoing postoperative expectations and precautions reviewed. Okay to hit tennis ball at 4 weeks postop  Gyn malignancy: -Reviewed pathology results.  Complete response to therapy.  No malignancy identified in the pathology, including the previously biopsy-proven lymph node. - Although we do not have origin proven with this pathology, patient has clearly responded excellently to therapy. - Patient safe to resume adjuvant therapy as scheduled.  Agree with continuation of carboplatin/Taxol/pembrolizumab. -Signs/symptoms of recurrence reviewed.  Surveillance reviewed.  Obturator nerve injury and repair: - Already showing significant improvement with PT. - General expectations in recovery reviewed. - Continue PT as prescribed. - Having knee pain which may be more due to her osteoarthritis and now with recovering from nerve injury may be exacerbating knee pain. - Discussed trial of gabapentin 300mg  nightly for neuropathic pain.  - May also need to follow-up with her ortho for her knee in case she would benefit from additional steroid injection as she has had in the past.  Lymphedema/lower extremity edema: - Had undergone preop evaluation with lymphedema clinic.  May be multifactorial due to lymph node involvement of cancer and prior  DVT. - Is supposed to follow-up postoperatively.  She wonders if this can be done in [facility] instead.  Encouraged patient to reach out to physical therapy clinic to see if they have offices or other recommendations in [facility].  I am happy to send a new referral if necessary.   RTC after completion of therapy.  [name], MD Gynecologic Oncology   Medical Decision Making I personally spent  TOTAL 45 minutes face-to-face and non-face-to-face in the care of this patient, which includes all pre, intra, and post visit time on the date of service. The discussion of management of gynecologic malignancy is beyond the scope of routine postoperative care.   ----------------------- Reason for Visit: Postop/treatment counseling  Treatment History: Oncology History Overview Note  Er neg, MSI stable, PD-L1 80% No genetic testing done, did not meet criteria   Uterine cancer (HCC)  10/23/2022 Imaging   1. 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 2. Multiple prominent palpable lymph nodes in the right groin measuring up to 2.1 cm, nonspecific. 3. Small right popliteal fossa Baker's cyst.   10/25/2022 Imaging   Ct imaging 1. Pelvic lymphadenopathy with the most prominent lymph node along the right iliac chain measuring 5.6 x 2.8 cm. Metastatic lymphadenopathy or lymphoproliferative disease cannot be excluded.  2. 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 3. Prominent right inguinal lymph nodes. 4. 8 mm sclerotic bone lesion within the right ischium, indeterminate. 5. Aortic atherosclerosis.   Aortic Atherosclerosis (ICD10-I70.0).       11/11/2022 Pathology Results   SURGICAL PATHOLOGY CASE: [id number] PATIENT: [name] Surgical Pathology Report  Specimen  Submitted: A. Lymph node, right pelvic  Clinical History: Pelvic lymphadenopathy  DIAGNOSIS: A. LYMPH NODE, RIGHT PELVIC; CT-GUIDED CORE NEEDLE BIOPSY: - METASTATIC POORLY DIFFERENTIATED CARCINOMA, SEE COMMENT.  Comment: Immunohistochemical studies show tumor cells to be positive for CK7, MOC-31, GATA-3, CK5/6, and calretinin (subset). P16 is strongly and diffusely positive. P53 demonstrates an absence of staining within tumor cells, indicative of a null staining pattern. PAX-8, WT-1, D240, TTF-1, cdx-2, and p40 are negative. The pattern of immunohistochemical staining is non-specific. Based on the morphologic features and pattern of immunohistochemical staining the differential diagnosis includes metastatic urothelial carcinoma, metastatic breast carcinoma, and metastatic serous carcinoma of gyn origin. Correlation with radiographic findings is required.  There is sufficient tissue present for ancillary molecular testing.  IHC slides were prepared by [facility] for Molecular Biology and Pathology, [place], [place]. All controls stained appropriately.   11/16/2022 PET scan   1. Examination is positive for tracer avid adenopathy within the retroperitoneum, bilateral pelvis and right inguinal region. Findings are compatible with nodal metastasis. Primary neoplasm is not apparent on the current exam.  2. No signs of solid organ metastasis within the abdomen or pelvis. 3. No signs of tracer avid disease above the diaphragm. 4. Asymmetric subcutaneous edema is identified involving the visualized portions of the right lower extremity. 5. 5 mm perifissural nodule in the left mid lung is too small to characterize by PET-CT. 6. Increased uptake within both lobes of thyroid gland, right greater than left. Correlation with thyroid function tests advised. 7.  Aortic Atherosclerosis (ICD10-I70.0).   11/19/2022 Initial Diagnosis   Gynecologic malignancy (HCC)   11/20/2022 Cancer Staging   Staging form:  Corpus Uteri - Carcinoma and Carcinosarcoma, AJCC 8th Edition - Clinical stage from 11/20/2022: FIGO Stage IVB (cTX, cN2a, pM1) - Signed by Artis Delay, MD on 11/20/2022 Stage prefix: Initial diagnosis   12/02/2022 Procedure   Ultrasound and fluoroscopically guided right internal jugular single lumen power port catheter insertion. Tip in the SVC/RA junction. Catheter ready for use.   12/04/2022 - 12/04/2022 Chemotherapy   Patient is on Treatment Plan : UTERINE Carboplatin AUC 6 + Paclitaxel q21d     12/04/2022 -  Chemotherapy   Patient is on Treatment Plan : UTERINE Pembrolizumab (200), Paclitaxel (175), Carboplatin (5) q21d x 6 cycles / Pembrolizumab (400) q42d     12/15/2022 Imaging   1. No adnexal mass. No uterine mass lesion evident and there is no substantial thickening of the endometrium by MRI. Low signal intensity fibrous stroma of the cervix appears preserved although high signal intensity of the cervical mucosa is somewhat prominent. This could be correlated with Pap smear as clinically warranted. 2. Stable right pelvic sidewall and groin lymphadenopathy. 3. Small Bartholin's cysts bilaterally.   02/05/2023 Imaging   CT CHEST ABDOMEN PELVIS W CONTRAST  Result Date: 02/05/2023 CLINICAL DATA:  History of endometrial cancer, monitor. High-risk. * Tracking Code: BO * EXAM: CT CHEST, ABDOMEN, AND PELVIS WITH CONTRAST TECHNIQUE: Multidetector CT imaging of the chest, abdomen and pelvis was performed following the standard protocol during bolus administration of intravenous contrast. RADIATION DOSE REDUCTION: This exam was performed according to the departmental dose-optimization program which includes automated exposure control, adjustment of the mA and/or kV according to patient size and/or use of iterative reconstruction technique. CONTRAST:  OMNIPAQUE IOHEXOL 300 MG/ML  SOLN COMPARISON:  Multiple priors including MRI pelvis December 15, 2022 PET-CT Nov 16, 2022 and CT abdomen pelvis October 25, 2022.  FINDINGS: CT CHEST FINDINGS Cardiovascular:  Right chest wall Port-A-Cath with the tip in the right atrium. Aortic atherosclerosis. Normal caliber thoracic aorta. No central pulmonary embolus on this nondedicated study. Normal size heart. No significant pericardial effusion/thickening Mediastinum/Nodes: No suspicious thyroid nodule. No pathologically enlarged mediastinal, hilar or axillary lymph nodes the esophagus is grossly unremarkable. Lungs/Pleura: Stable scattered tiny pulmonary nodules. For instance: -2 mm right upper lobe pulmonary nodule on image 29/3, unchanged -2 mm pulmonary nodule along the right major fissure on image 73/3, unchanged -4 mm pulmonary nodule along the left major fissure on image 73/3, unchanged. No pleural effusion.  No pneumothorax. Musculoskeletal: No suspicious chest wall lesion. No aggressive lytic or blastic lesion of bone. CT ABDOMEN PELVIS FINDINGS Hepatobiliary: Diffuse hepatic steatosis. Gallbladder is unremarkable. No biliary ductal dilation Pancreas: No pancreatic ductal dilation or evidence of acute inflammation Spleen: No splenomegaly. Adrenals/Urinary Tract: Bilateral adrenal glands appear normal. No hydronephrosis. Kidneys demonstrate symmetric enhancement. No suspicious renal mass. Urinary bladder is not well evaluated due to underdistention and streak artifact from left hip arthroplasty. Stomach/Bowel: No radiopaque enteric contrast material was administered. Hyperdensity along the posterior aspect of the stomach on image 53/2 appears similar on delayed imaging and is compatible with ingested material. No pathologic dilation of large or small bowel. No evidence of acute bowel inflammation. Vascular/Lymphatic: Normal caliber abdominal aorta. Aortic atherosclerosis. The portal, splenic and superior mesenteric veins are patent. Decreased size of the retroperitoneal, bilateral pelvic sidewall/iliac side chain and right inguinal lymph nodes. For reference: -Retrocaval lymph  node measures 6 mm in short axis on image 69/2 previously 11 mm -Right sidewall lymph node measures 1.5 cm in short axis on image 110/2 previously 2.9 cm Reproductive: Evaluation of the pelvic reproductive structures is limited by streak artifact from left hip arthroplasty. Other: No significant abdominopelvic free fluid. No discrete peritoneal or omental nodularity. Musculoskeletal: No aggressive lytic or blastic lesion of bone. Multilevel degenerative changes spine. Left total hip arthroplasty. Degenerative change of the right hip. IMPRESSION: 1. Decreased size of the retroperitoneal, bilateral pelvic sidewall/iliac side chain and right inguinal lymph nodes, compatible with treatment response. 2. Stable scattered tiny pulmonary nodules, nonspecific but favored benign. Suggest continued attention on follow-up imaging 3. No evidence of new or progressive metastatic disease within the chest, abdomen or pelvis. 4. Diffuse hepatic steatosis. 5.  Aortic Atherosclerosis (ICD10-I70.0). Electronically Signed   By: Maudry Mayhew M.D.   On: 02/05/2023 15:03   XR Wrist Complete Right  Result Date: 01/13/2023 Right wrist  3 views: Right wrist distal radius fracture remains unchanged in overall position alignment.  Fracture is healed.  No acute findings otherwise.     02/16/2023 Pathology Results    SURGICAL PATHOLOGY CASE: [id number] PATIENT: [name] Surgical Pathology Report  Clinical History: poorly differentiated carcinoma of likely GYN origin  FINAL MICROSCOPIC DIAGNOSIS:  A. UTERUS, CERVIX, BILATERAL FALLOPIAN TUBES, AND OVARIES, RESECTION: Cervix     Nabothian cysts     Negative for dysplasia or malignancy Endometrium     Atrophic     Negative for atypia, hyperplasia or malignancy Myometrium     Unremarkable with focal vascular calcifications     Negative for malignancy Right ovary     Focal paraovarian, inactive endometriosis     Negative for malignancy Left ovary      Unremarkable     Negative for endometriosis or malignancy Right fallopian tube     Not identified in entirely submitted right adnexa Left fallopian tube     Not identified in entirely submitted  left adnexa  B. RIGHT PELVIC LYMPH NODE, EXCISION: Lymph node with hyalin fibrosis, focal necrosis and dystrophic calcifications Focal perinodal fat necrosis Negative for residual, viable carcinoma See comment  C. SEGMENT OF OBTURATOR NERVE, EXCISION: Benign nerve Negative for malignancy  D. OMENTUM: Benign adipose tissue Negative for malignancy  COMMENT: B. The right pelvic lymph node is entirely submitted and shows large areas of hyalin fibrosis focally associated with necrosis and dystrophic calcifications.  The findings are consistent with tumor regression although no residual, viable carcinoma is identified.     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 No palpable right lymphadenopathy. On entry to abdomen, normal upper abdominal survey with smooth diaphragm, liver, stomach and normal appearing omentum and bowel. Nodule in the gallbladder fossa (image in media). In the pelvis, filmy adhesions of the sigmoid colon to the left pelvic sidewall. Otherwise small, normal appearing uterus and bilateral fallopian tubes and ovaries. Right pelvic sidewall with enlarged lymph node, approximately 4x2cm, densely adherent to the pelvic sidewall, external iliac vein. Densely adherent and encasing the obturator nerve and artery. With careful  dissection, unavoidable avulsion injury occurred to the obturator artery. Due to the densely adherent nature of the lymph node, the nerve and artery were initially inseparable. In obtaining control of the bleeding from the obturator artery, unavoidable crush and cautery injury to the obturator nerve. Following removal of the involved node, the affected portion of the obturator nerve was resected and well reapproximated. No other gross evidence of disease in the abdomen or pelvis.      Interval History: Pt reports that she is recovering well from surgery. She is not needing anything more for pain. She is eating and drinking well. She is voiding without issue and having regular bowel movements.   Patient is undergoing physical therapy 3 times a week.  She feels that this is helping and she is getting better and better with time.  She notes that she could do one of the exercises at all at the beginning but now can do it more more.  She does note worsening of right knee pain.  She has had pain in this knee before but this is worse than before.  She does note that the pain improves, and then the "warms up" with activity.  She has been told she has arthritis in this knee previously and has had steroid shots in this knee before.   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  Top of Left Hand(Tx p Bx)    Past Surgical History:  Procedure Laterality Date   CARPAL TUNNEL  RELEASE     BILAT     CATARACT EXTRACTION     BILAT    ECTOPIC PREGNANCY SURGERY     in the setting of prior BTL, lapartomy   EYE SURGERY     IR IMAGING GUIDED PORT INSERTION  12/02/2022   KNEE CARTILAGE SURGERY     RIGHT    (MENISCUS)   LAPAROTOMY N/A 02/16/2023   Procedure: MINI LAPAROTOMY;  Surgeon: [name], MD;  Location: [facility];  Service: Gynecology;  Laterality: N/A;   LYMPH NODE DISSECTION Right 02/16/2023   Procedure: RIGHT PELVIC LYMPH NODE DISSECTION;  Surgeon: [name], MD;  Location: [facility];  Service: Gynecology;  Laterality: Right;   MOHS SURGERY     left nose/inner eye for basal cell   TONSILLECTOMY     TOTAL HIP ARTHROPLASTY Left 12/19/2015   TOTAL HIP ARTHROPLASTY Left 12/19/2015   Procedure: LEFT TOTAL HIP ARTHROPLASTY;  Surgeon: Valeria Batman, MD;  Location: [facility] OR;  Service: Orthopedics;  Laterality: Left;   TRIGGER FINGER RELEASE     TUBAL LIGATION      Family History  Problem Relation Age of Onset   Uterine cancer Mother 55   Heart attack Mother    Hypertension Mother    Kidney cancer Father 33   Lung cancer Father    Diabetes Sister    Thyroid disease Neg Hx    Colon cancer Neg Hx    Breast cancer Neg Hx    Ovarian cancer Neg Hx    Endometrial cancer Neg Hx    Pancreatic cancer Neg Hx    Prostate cancer Neg Hx     Social History   Socioeconomic History   Marital status: Married    Spouse name: Not on file   Number of children: Not on file   Years of education: Not on file   Highest education level: Not on file  Occupational History   Not on file  Tobacco Use   Smoking status: Never   Smokeless tobacco: Never  Vaping Use   Vaping status: Never Used  Substance and Sexual Activity   Alcohol use: Yes    Alcohol/week: 0.0 standard drinks of alcohol    Comment: Rare   Drug use: No   Sexual activity: Yes    Birth control/protection: Surgical, Post-menopausal    Comment: Tubal lig-1st intercourse 72 yo-Fewer than 5 partners   Other Topics Concern   Not on file  Social History Narrative   Not on file   Social Determinants of Health   Financial Resource Strain: Not on file  Food Insecurity: Low Risk  (12/17/2022)   [facility]   Hunger Vital Sign    Worried About Running Out of Food in the Last Year: Never true    Ran Out of Food in the Last Year: Never true  Transportation Needs: No Transportation Needs (12/17/2022)   [facility]   Transportation    In the past 12 months, has lack of reliable transportation kept you from medical appointments, meetings, work or from getting things needed for daily living? : No  Physical Activity: Not on file  Stress: Not on file  Social Connections: Not on file    Current Medications:  Current Outpatient Medications:    acetaminophen (TYLENOL) 500 MG tablet, Take 1,000 mg by mouth every  6 (six) hours as needed for moderate pain., Disp: , Rfl:    calcium carbonate (TUMS) 500 MG chewable tablet, Chew 1 tablet by mouth daily., Disp: , Rfl:    Carboxymethylcellulose Sodium (THERATEARS OP), Place 1 drop into both eyes 3 (three) times daily., Disp: , Rfl:    Cholecalciferol (VITAMIN D) 50 MCG (2000 UT) tablet, Take 2,000 Units by mouth daily., Disp: , Rfl:    dexamethasone (DECADRON) 4 MG tablet, Take 2 tabs at the night before and 2 tab the morning of chemotherapy, every 3 weeks, by mouth x 6 cycles, Disp: 24 tablet, Rfl: 6   diclofenac sodium (VOLTAREN) 1 % GEL, Apply 2-4 g topically 4 (four) times daily. (Patient taking differently: Apply 2-4 g topically 4 (four) times daily as needed (pain).), Disp: 3 Tube, Rfl: 3   ELIQUIS 5 MG TABS tablet, Take 5 mg by mouth 2 (two) times daily., Disp: , Rfl:    Glucosamine 500 MG CAPS, Take 500 mg by mouth 2 (two) times daily., Disp: , Rfl:    ibuprofen (ADVIL) 200 MG tablet, Take 400 mg by mouth daily as needed for moderate pain., Disp: , Rfl:    levothyroxine (SYNTHROID) 75  MCG tablet, Take 1 tablet (75 mcg total) by mouth daily., Disp: 90 tablet, Rfl: 3   lidocaine-prilocaine (EMLA) cream, Apply 1 Application topically as needed., Disp: 30 g, Rfl: 3   loratadine (CLARITIN) 10 MG tablet, Take 10 mg by mouth See admin instructions. Take 10 mg daily for 5 days starting on the day of chemo. May take 10 mg daily as needed for allergies, Disp: , Rfl:    melatonin 5 MG TABS, Take 5 mg by mouth at bedtime as needed (sleep)., Disp: , Rfl:    Multiple Vitamin (MULTIVITAMIN) tablet, Take 1 tablet by mouth daily., Disp: , Rfl:    ondansetron (ZOFRAN) 8 MG tablet, Take 1 tablet (8 mg total) by mouth every 8 (eight) hours as needed for nausea., Disp: 30 tablet, Rfl: 3   oxyCODONE (OXY IR/ROXICODONE) 5 MG immediate release tablet, Take 1 tablet (5 mg total) by mouth every 4 (four) hours as needed for severe pain. For AFTER surgery only, do not take and drive, Disp: 15 tablet, Rfl: 0   prochlorperazine (COMPAZINE) 10 MG tablet, Take 1 tablet (10 mg total) by mouth every 6 (six) hours as needed for nausea or vomiting., Disp: 30 tablet, Rfl: 0   senna-docusate (SENOKOT-S) 8.6-50 MG tablet, Take 2 tablets by mouth at bedtime. For AFTER surgery, do not take if having diarrhea, Disp: 30 tablet, Rfl: 0  Review of Symptoms: Complete 10-system review is negative except as above in Interval History.  Physical Exam: BP 107/68 (BP Location: Left Arm, Patient Position: Sitting)   Pulse 70   Temp 98.6 F (37 C) (Oral)   Resp 16   Wt 145 lb 6.4 oz (66 kg)   SpO2 100%   BMI 26.59 kg/m  General: Alert, oriented, no acute distress. HEENT: Normocephalic, atraumatic. Neck symmetric without masses. Sclera anicteric.  Chest: Normal work of breathing. Clear to auscultation bilaterally.   Cardiovascular: Regular rate and rhythm, no murmurs. Abdomen: Soft, nontender.  Normoactive bowel sounds.  No masses appreciated.  Well-healing incisions with glue. Extremities: Grossly normal range of motion.   Able to perform adductor motions with the right leg but slightly weaker on this leg compared to left.  Warm, well perfused.  Some prominence of varicose veins on right lower extremity. Skin: No rashes or lesions  noted. GU: Normal appearing external genitalia without erythema, excoriation, or lesions.  Speculum exam reveals well-healing vaginal cuff.  Some evidence of abrasions of the vaginal walls and cuff but no bleeding or discharge..  Bimanual exam reveals intact vaginal cuff. Exam chaperoned by Andrey Cota, RN   Laboratory & Radiologic Studies: FINAL MICROSCOPIC DIAGNOSIS:  A. UTERUS, CERVIX, BILATERAL FALLOPIAN TUBES, AND OVARIES, RESECTION: Cervix     Nabothian cysts     Negative for dysplasia or malignancy Endometrium     Atrophic     Negative for atypia, hyperplasia or malignancy Myometrium     Unremarkable with focal vascular calcifications     Negative for malignancy Right ovary     Focal paraovarian, inactive endometriosis     Negative for malignancy Left ovary     Unremarkable     Negative for endometriosis or malignancy Right fallopian tube     Not identified in entirely submitted right adnexa Left fallopian tube     Not identified in entirely submitted left adnexa  B. RIGHT PELVIC LYMPH NODE, EXCISION: Lymph node with hyalin fibrosis, focal necrosis and dystrophic calcifications Focal perinodal fat necrosis Negative for residual, viable carcinoma See comment  C. SEGMENT OF OBTURATOR NERVE, EXCISION: Benign nerve Negative for malignancy  D. OMENTUM: Benign adipose tissue Negative for malignancy  COMMENT: B. The right pelvic lymph node is entirely submitted and shows large areas of hyalin fibrosis focally associated with necrosis and dystrophic calcifications.  The findings are consistent with tumor regression although no residual, viable carcinoma is identified.

## 2023-03-08 NOTE — Patient Instructions (Signed)
It was a pleasure to see you in clinic today. - Healing well. - Continue with PT as they recommend - Recommend call the Lymphedema clinic to see if they know if there is a group in [place]. Happy to help facilitate if so  Thank you very much for allowing me to provide care for you today.  I appreciate your confidence in choosing our Gynecologic Oncology team at [facility]  If you have any questions about your visit today please call our office or send [place] a MyChart message and we will get back to you as soon as possible.

## 2023-03-09 ENCOUNTER — Encounter: Payer: Self-pay | Admitting: Psychiatry

## 2023-03-09 ENCOUNTER — Encounter: Payer: Self-pay | Admitting: Hematology and Oncology

## 2023-03-18 MED FILL — Dexamethasone Sodium Phosphate Inj 100 MG/10ML: INTRAMUSCULAR | Qty: 1 | Status: AC

## 2023-03-18 MED FILL — Fosaprepitant Dimeglumine For IV Infusion 150 MG (Base Eq): INTRAVENOUS | Qty: 5 | Status: AC

## 2023-03-19 ENCOUNTER — Inpatient Hospital Stay: Payer: Medicare PPO | Admitting: Hematology and Oncology

## 2023-03-19 ENCOUNTER — Inpatient Hospital Stay: Payer: Medicare PPO

## 2023-03-19 ENCOUNTER — Encounter: Payer: Self-pay | Admitting: Hematology and Oncology

## 2023-03-19 VITALS — BP 120/60 | HR 64 | Temp 97.9°F | Resp 18 | Ht 62.0 in | Wt 145.0 lb

## 2023-03-19 DIAGNOSIS — D649 Anemia, unspecified: Secondary | ICD-10-CM | POA: Diagnosis not present

## 2023-03-19 DIAGNOSIS — G620 Drug-induced polyneuropathy: Secondary | ICD-10-CM | POA: Diagnosis not present

## 2023-03-19 DIAGNOSIS — C55 Malignant neoplasm of uterus, part unspecified: Secondary | ICD-10-CM

## 2023-03-19 DIAGNOSIS — T451X5A Adverse effect of antineoplastic and immunosuppressive drugs, initial encounter: Secondary | ICD-10-CM | POA: Insufficient documentation

## 2023-03-19 DIAGNOSIS — C801 Malignant (primary) neoplasm, unspecified: Secondary | ICD-10-CM | POA: Diagnosis not present

## 2023-03-19 LAB — CBC WITH DIFFERENTIAL (CANCER CENTER ONLY)
Abs Immature Granulocytes: 0.02 10*3/uL (ref 0.00–0.07)
Basophils Absolute: 0 10*3/uL (ref 0.0–0.1)
Basophils Relative: 0 %
Eosinophils Absolute: 0 10*3/uL (ref 0.0–0.5)
Eosinophils Relative: 0 %
HCT: 33 % — ABNORMAL LOW (ref 36.0–46.0)
Hemoglobin: 11.3 g/dL — ABNORMAL LOW (ref 12.0–15.0)
Immature Granulocytes: 1 %
Lymphocytes Relative: 15 %
Lymphs Abs: 0.6 10*3/uL — ABNORMAL LOW (ref 0.7–4.0)
MCH: 31.7 pg (ref 26.0–34.0)
MCHC: 34.2 g/dL (ref 30.0–36.0)
MCV: 92.7 fL (ref 80.0–100.0)
Monocytes Absolute: 0.1 10*3/uL (ref 0.1–1.0)
Monocytes Relative: 3 %
Neutro Abs: 3.3 10*3/uL (ref 1.7–7.7)
Neutrophils Relative %: 81 %
Platelet Count: 193 10*3/uL (ref 150–400)
RBC: 3.56 MIL/uL — ABNORMAL LOW (ref 3.87–5.11)
RDW: 13.2 % (ref 11.5–15.5)
WBC Count: 4 10*3/uL (ref 4.0–10.5)
nRBC: 0 % (ref 0.0–0.2)

## 2023-03-19 LAB — CMP (CANCER CENTER ONLY)
ALT: 9 U/L (ref 0–44)
AST: 17 U/L (ref 15–41)
Albumin: 4.2 g/dL (ref 3.5–5.0)
Alkaline Phosphatase: 69 U/L (ref 38–126)
Anion gap: 6 (ref 5–15)
BUN: 15 mg/dL (ref 8–23)
CO2: 29 mmol/L (ref 22–32)
Calcium: 9.7 mg/dL (ref 8.9–10.3)
Chloride: 104 mmol/L (ref 98–111)
Creatinine: 0.71 mg/dL (ref 0.44–1.00)
GFR, Estimated: >60 mL/min (ref >60)
Glucose, Bld: 128 mg/dL — ABNORMAL HIGH (ref 70–99)
Potassium: 4 mmol/L (ref 3.5–5.1)
Sodium: 139 mmol/L (ref 135–145)
Total Bilirubin: 0.5 mg/dL (ref 0.3–1.2)
Total Protein: 7.1 g/dL (ref 6.5–8.1)

## 2023-03-19 MED ORDER — SODIUM CHLORIDE 0.9 % IV SOLN
10.0000 mg | Freq: Once | INTRAVENOUS | Status: AC
  Administered 2023-03-19: 10 mg via INTRAVENOUS
  Filled 2023-03-19: qty 1
  Filled 2023-03-19: qty 10

## 2023-03-19 MED ORDER — GABAPENTIN 100 MG PO CAPS: 100.0000 mg | ORAL_CAPSULE | Freq: Every day | ORAL | 1 refills | Status: DC

## 2023-03-19 MED ORDER — HEPARIN SOD (PORK) LOCK FLUSH 100 UNIT/ML IV SOLN
500.0000 [IU] | Freq: Once | INTRAVENOUS | Status: AC | PRN
  Administered 2023-03-19: 500 [IU]

## 2023-03-19 MED ORDER — SODIUM CHLORIDE 0.9 % IV SOLN
200.0000 mg | Freq: Once | INTRAVENOUS | Status: AC
  Administered 2023-03-19: 200 mg via INTRAVENOUS
  Filled 2023-03-19: qty 200

## 2023-03-19 MED ORDER — FAMOTIDINE IN NACL 20-0.9 MG/50ML-% IV SOLN
20.0000 mg | Freq: Once | INTRAVENOUS | Status: AC
  Administered 2023-03-19: 20 mg via INTRAVENOUS
  Filled 2023-03-19: qty 50

## 2023-03-19 MED ORDER — SODIUM CHLORIDE 0.9 % IV SOLN
105.0000 mg/m2 | Freq: Once | INTRAVENOUS | Status: AC
  Administered 2023-03-19: 180 mg via INTRAVENOUS
  Filled 2023-03-19: qty 30

## 2023-03-19 MED ORDER — SODIUM CHLORIDE 0.9 % IV SOLN
402.5000 mg | Freq: Once | INTRAVENOUS | Status: AC
  Administered 2023-03-19: 400 mg via INTRAVENOUS
  Filled 2023-03-19: qty 40

## 2023-03-19 MED ORDER — CETIRIZINE HCL 10 MG/ML IV SOLN
10.0000 mg | Freq: Once | INTRAVENOUS | Status: AC
  Administered 2023-03-19: 10 mg via INTRAVENOUS
  Filled 2023-03-19: qty 1

## 2023-03-19 MED ORDER — SODIUM CHLORIDE 0.9 % IV SOLN: Freq: Once | INTRAVENOUS | Status: AC

## 2023-03-19 MED ORDER — SODIUM CHLORIDE 0.9% FLUSH
10.0000 mL | Freq: Once | INTRAVENOUS | Status: AC
  Administered 2023-03-19: 10 mL

## 2023-03-19 MED ORDER — SODIUM CHLORIDE 0.9% FLUSH
10.0000 mL | INTRAVENOUS | Status: DC | PRN
  Administered 2023-03-19: 10 mL

## 2023-03-19 MED ORDER — PALONOSETRON HCL INJECTION 0.25 MG/5ML
0.2500 mg | Freq: Once | INTRAVENOUS | Status: AC
  Administered 2023-03-19: 0.25 mg via INTRAVENOUS
  Filled 2023-03-19: qty 5

## 2023-03-19 MED ORDER — SODIUM CHLORIDE 0.9 % IV SOLN
150.0000 mg | Freq: Once | INTRAVENOUS | Status: AC
  Administered 2023-03-19: 150 mg via INTRAVENOUS
  Filled 2023-03-19: qty 150
  Filled 2023-03-19: qty 5

## 2023-03-19 NOTE — Progress Notes (Signed)
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 I plan to reduce the dose of treatment as outlined above.  I explained to the patient the rationale of this strategy and reassured the patient it would not compromise the efficacy of treatment I reduced the dose of gabapentin She will continue physical therapy and rehab  Mild anemia This is likely due to recent treatment. The patient denies recent history of bleeding such as epistaxis, hematuria or hematochezia. She is asymptomatic from the anemia. I will observe for now.  She does not require transfusion now. I will continue the chemotherapy at current dose without dosage adjustment.  If the anemia gets progressive worse in the future, I might have to delay her treatment or adjust the chemotherapy dose.   No orders of the defined types were placed in this encounter.   All questions were answered. The patient knows to call the clinic with any problems, questions or concerns. The total time spent in the appointment was 40 minutes encounter with patients including review of chart and various tests results, discussions about plan of care and coordination of care plan   Artis Delay, MD 03/19/2023 1:28 PM  INTERVAL HISTORY: Please see below for problem oriented charting. she returns for treatment follow-up She returns after recent surgery We discussed surgical report and pathology report She has some neuropathic  pain and was prescribed gabapentin with some improvement but that causes excessive sedation She is doing physical therapy and rehab We discussed plan of care and future follow-up  REVIEW OF SYSTEMS:   Constitutional: Denies fevers, chills or abnormal weight loss Eyes: Denies blurriness of vision Ears, nose, mouth, throat, and face: Denies mucositis or sore throat Respiratory: Denies cough, dyspnea or wheezes Cardiovascular: Denies palpitation, chest discomfort or lower extremity swelling Gastrointestinal:  Denies nausea, heartburn or change in bowel habits Skin: Denies abnormal skin rashes Lymphatics: Denies new lymphadenopathy or easy bruising Neurological:Denies numbness, tingling or new weaknesses Behavioral/Psych: Mood is stable, no new changes  All other systems were reviewed with the patient and are negative.  I have reviewed the past medical history, past surgical history, social history and family history with the patient and they are unchanged from previous note.  ALLERGIES:  is allergic to sulfa antibiotics.  MEDICATIONS:  Current Outpatient Medications  Medication Sig Dispense Refill   acetaminophen (TYLENOL) 500 MG tablet Take 1,000 mg by mouth every 6 (six) hours as needed for moderate pain.     calcium carbonate (TUMS) 500 MG chewable tablet Chew 1 tablet by mouth daily.     Carboxymethylcellulose Sodium (THERATEARS OP) Place 1 drop into both eyes 3 (three) times daily.     Cholecalciferol (VITAMIN D) 50 MCG (2000 UT) tablet Take 2,000 Units by mouth daily.     dexamethasone (DECADRON) 4 MG tablet Take 2 tabs at the night before and 2 tab the morning of chemotherapy, every 3 weeks, by mouth x 6 cycles 24 tablet 6   diclofenac sodium (VOLTAREN) 1 % GEL Apply 2-4 g  topically 4 (four) times daily. (Patient taking differently: Apply 2-4 g topically 4 (four) times daily as needed (pain).) 3 Tube 3   ELIQUIS 5 MG TABS tablet Take 5 mg by mouth 2 (two) times daily.      gabapentin (NEURONTIN) 100 MG capsule Take 1 capsule (100 mg total) by mouth at bedtime. 30 capsule 1   Glucosamine 500 MG CAPS Take 500 mg by mouth 2 (two) times daily.     ibuprofen (ADVIL) 200 MG tablet Take 400 mg by mouth daily as needed for moderate pain.     levothyroxine (SYNTHROID) 75 MCG tablet Take 1 tablet (75 mcg total) by mouth daily. 90 tablet 3   lidocaine-prilocaine (EMLA) cream Apply 1 Application topically as needed. 30 g 3   loratadine (CLARITIN) 10 MG tablet Take 10 mg by mouth See admin instructions. Take 10 mg daily for 5 days starting on the day of chemo. May take 10 mg daily as needed for allergies     melatonin 5 MG TABS Take 5 mg by mouth at bedtime as needed (sleep).     Multiple Vitamin (MULTIVITAMIN) tablet Take 1 tablet by mouth daily.     ondansetron (ZOFRAN) 8 MG tablet Take 1 tablet (8 mg total) by mouth every 8 (eight) hours as needed for nausea. 30 tablet 3   oxyCODONE (OXY IR/ROXICODONE) 5 MG immediate release tablet Take 1 tablet (5 mg total) by mouth every 4 (four) hours as needed for severe pain. For AFTER surgery only, do not take and drive 15 tablet 0   prochlorperazine (COMPAZINE) 10 MG tablet Take 1 tablet (10 mg total) by mouth every 6 (six) hours as needed for nausea or vomiting. 30 tablet 0   No current facility-administered medications for this visit.   Facility-Administered Medications Ordered in Other Visits  Medication Dose Route Frequency Provider Last Rate Last Admin   CARBOplatin (PARAPLATIN) 400 mg in sodium chloride 0.9 % 250 mL chemo infusion  400 mg Intravenous Once Bertis Ruddy, Vaden Becherer, MD       heparin lock flush 100 unit/mL  500 Units Intracatheter Once PRN Bertis Ruddy, Nitasha Jewel, MD       PACLitaxel (TAXOL) 180 mg in sodium chloride 0.9 % 250 mL chemo infusion (> 80mg /m2)  105 mg/m2 (Treatment Plan Recorded) Intravenous Once Bertis Ruddy, Aishah Teffeteller, MD 93 mL/hr at 03/19/23 1244 180 mg at 03/19/23 1244   sodium chloride flush (NS) 0.9 % injection 10 mL  10 mL  Intracatheter PRN Artis Delay, MD        SUMMARY OF ONCOLOGIC HISTORY: Oncology History Overview Note  Er neg, MSI stable, PD-L1 80% No genetic testing done, did not meet criteria   Uterine cancer (HCC)  10/23/2022 Imaging   1. 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 2. Multiple prominent palpable lymph nodes in the right groin measuring up to 2.1 cm, nonspecific. 3. Small right popliteal fossa Baker's cyst.   10/25/2022 Imaging   Ct imaging 1. Pelvic lymphadenopathy with the most prominent lymph node along the right iliac chain measuring 5.6 x 2.8 cm. Metastatic lymphadenopathy or lymphoproliferative disease cannot be excluded.  2. 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 3. Prominent right inguinal lymph nodes. 4. 8 mm sclerotic bone lesion within the right ischium, indeterminate. 5. Aortic atherosclerosis.   Aortic Atherosclerosis (ICD10-I70.0).  11/11/2022 Pathology Results   SURGICAL PATHOLOGY CASE: [id number] PATIENT: Colandra Coe Surgical Pathology Report  Specimen Submitted: A. Lymph node, right pelvic  Clinical History: Pelvic lymphadenopathy  DIAGNOSIS: A. LYMPH NODE, RIGHT PELVIC; CT-GUIDED CORE NEEDLE BIOPSY: - METASTATIC POORLY DIFFERENTIATED CARCINOMA, SEE COMMENT.  Comment: Immunohistochemical studies show tumor cells to be positive for CK7, MOC-31, GATA-3, CK5/6, and calretinin (subset). P16 is strongly and diffusely positive. P53 demonstrates an absence of staining within tumor cells, indicative of a null staining pattern. PAX-8, WT-1, D240, TTF-1, cdx-2, and p40 are negative. The pattern of immunohistochemical staining is non-specific. Based on the morphologic features and pattern of immunohistochemical staining the differential diagnosis  includes metastatic urothelial carcinoma, metastatic breast carcinoma, and metastatic serous carcinoma of gyn origin. Correlation with radiographic findings is required.  There is sufficient tissue present for ancillary molecular testing.  IHC slides were prepared by [facility], [place], [place]. All controls stained appropriately.   11/16/2022 PET scan   1. Examination is positive for tracer avid adenopathy within the retroperitoneum, bilateral pelvis and right inguinal region. Findings are compatible with nodal metastasis. Primary neoplasm is not apparent on the current exam.  2. No signs of solid organ metastasis within the abdomen or pelvis. 3. No signs of tracer avid disease above the diaphragm. 4. Asymmetric subcutaneous edema is identified involving the visualized portions of the right lower extremity. 5. 5 mm perifissural nodule in the left mid lung is too small to characterize by PET-CT. 6. Increased uptake within both lobes of thyroid gland, right greater than left. Correlation with thyroid function tests advised. 7.  Aortic Atherosclerosis (ICD10-I70.0).   11/19/2022 Initial Diagnosis   Gynecologic malignancy (HCC)   11/20/2022 Cancer Staging   Staging form: Corpus Uteri - Carcinoma and Carcinosarcoma, AJCC 8th Edition - Clinical stage from 11/20/2022: FIGO Stage IVB (cTX, cN2a, pM1) - Signed by Artis Delay, MD on 11/20/2022 Stage prefix: Initial diagnosis   12/02/2022 Procedure   Ultrasound and fluoroscopically guided right internal jugular single lumen power port catheter insertion. Tip in the SVC/RA junction. Catheter ready for use.   12/04/2022 - 12/04/2022 Chemotherapy   Patient is on Treatment Plan : UTERINE Carboplatin AUC 6 + Paclitaxel q21d     12/04/2022 -  Chemotherapy   Patient is on Treatment Plan : UTERINE Pembrolizumab (200), Paclitaxel (175), Carboplatin (5) q21d x 6 cycles / Pembrolizumab (400) q42d     12/15/2022 Imaging   1. No adnexal  mass. No uterine mass lesion evident and there is no substantial thickening of the endometrium by MRI. Low signal intensity fibrous stroma of the cervix appears preserved although high signal intensity of the cervical mucosa is somewhat prominent. This could be correlated with Pap smear as clinically warranted. 2. Stable right pelvic sidewall and groin lymphadenopathy. 3. Small Bartholin's cysts bilaterally.   02/05/2023 Imaging   CT CHEST ABDOMEN PELVIS W CONTRAST  Result Date: 02/05/2023 CLINICAL DATA:  History of endometrial cancer, monitor. High-risk. * Tracking Code: BO * EXAM: CT CHEST, ABDOMEN, AND PELVIS WITH CONTRAST TECHNIQUE: Multidetector CT imaging of the chest, abdomen and pelvis was performed following the standard protocol during bolus administration of intravenous contrast. RADIATION DOSE REDUCTION: This exam was performed according to the departmental dose-optimization program which includes automated exposure control, adjustment of the mA and/or kV according to patient size and/or use of iterative reconstruction technique. CONTRAST:  OMNIPAQUE IOHEXOL 300 MG/ML  SOLN COMPARISON:  Multiple priors including MRI pelvis December 15, 2022 PET-CT  Nov 16, 2022 and CT abdomen pelvis October 25, 2022. FINDINGS: CT CHEST FINDINGS Cardiovascular: Right chest wall Port-A-Cath with the tip in the right atrium. Aortic atherosclerosis. Normal caliber thoracic aorta. No central pulmonary embolus on this nondedicated study. Normal size heart. No significant pericardial effusion/thickening Mediastinum/Nodes: No suspicious thyroid nodule. No pathologically enlarged mediastinal, hilar or axillary lymph nodes the esophagus is grossly unremarkable. Lungs/Pleura: Stable scattered tiny pulmonary nodules. For instance: -2 mm right upper lobe pulmonary nodule on image 29/3, unchanged -2 mm pulmonary nodule along the right major fissure on image 73/3, unchanged -4 mm pulmonary nodule along the left major fissure on  image 73/3, unchanged. No pleural effusion.  No pneumothorax. Musculoskeletal: No suspicious chest wall lesion. No aggressive lytic or blastic lesion of bone. CT ABDOMEN PELVIS FINDINGS Hepatobiliary: Diffuse hepatic steatosis. Gallbladder is unremarkable. No biliary ductal dilation Pancreas: No pancreatic ductal dilation or evidence of acute inflammation Spleen: No splenomegaly. Adrenals/Urinary Tract: Bilateral adrenal glands appear normal. No hydronephrosis. Kidneys demonstrate symmetric enhancement. No suspicious renal mass. Urinary bladder is not well evaluated due to underdistention and streak artifact from left hip arthroplasty. Stomach/Bowel: No radiopaque enteric contrast material was administered. Hyperdensity along the posterior aspect of the stomach on image 53/2 appears similar on delayed imaging and is compatible with ingested material. No pathologic dilation of large or small bowel. No evidence of acute bowel inflammation. Vascular/Lymphatic: Normal caliber abdominal aorta. Aortic atherosclerosis. The portal, splenic and superior mesenteric veins are patent. Decreased size of the retroperitoneal, bilateral pelvic sidewall/iliac side chain and right inguinal lymph nodes. For reference: -Retrocaval lymph node measures 6 mm in short axis on image 69/2 previously 11 mm -Right sidewall lymph node measures 1.5 cm in short axis on image 110/2 previously 2.9 cm Reproductive: Evaluation of the pelvic reproductive structures is limited by streak artifact from left hip arthroplasty. Other: No significant abdominopelvic free fluid. No discrete peritoneal or omental nodularity. Musculoskeletal: No aggressive lytic or blastic lesion of bone. Multilevel degenerative changes spine. Left total hip arthroplasty. Degenerative change of the right hip. IMPRESSION: 1. Decreased size of the retroperitoneal, bilateral pelvic sidewall/iliac side chain and right inguinal lymph nodes, compatible with treatment response. 2.  Stable scattered tiny pulmonary nodules, nonspecific but favored benign. Suggest continued attention on follow-up imaging 3. No evidence of new or progressive metastatic disease within the chest, abdomen or pelvis. 4. Diffuse hepatic steatosis. 5.  Aortic Atherosclerosis (ICD10-I70.0). Electronically Signed   By: Maudry Mayhew M.D.   On: 02/05/2023 15:03   XR Wrist Complete Right  Result Date: 01/13/2023 Right wrist  3 views: Right wrist distal radius fracture remains unchanged in overall position alignment.  Fracture is healed.  No acute findings otherwise.     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 UTERUS, CERVIX, BILATERAL FALLOPIAN TUBES, AND OVARIES, RESECTION: Cervix     Nabothian cysts     Negative for dysplasia or malignancy Endometrium     Atrophic     Negative for atypia, hyperplasia or malignancy Myometrium     Unremarkable with focal vascular calcifications     Negative for malignancy Right ovary     Focal paraovarian, inactive endometriosis     Negative for malignancy Left ovary     Unremarkable     Negative for endometriosis or malignancy Right fallopian tube     Not identified in entirely  submitted right adnexa Left fallopian tube     Not identified in entirely submitted left adnexa  B. RIGHT PELVIC LYMPH NODE, EXCISION: Lymph node with hyalin fibrosis, focal necrosis and dystrophic calcifications Focal perinodal fat necrosis Negative for residual, viable carcinoma See comment  C. SEGMENT OF OBTURATOR NERVE, EXCISION: Benign nerve Negative for malignancy  D. OMENTUM: Benign adipose tissue Negative for malignancy  COMMENT: B. The right pelvic lymph node is entirely submitted and shows large areas of hyalin fibrosis focally associated with necrosis and  dystrophic calcifications.  The findings are consistent with tumor regression although no residual, viable carcinoma is identified.     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 No palpable right lymphadenopathy. On entry to abdomen, normal upper abdominal survey with smooth diaphragm, liver, stomach and normal appearing omentum and bowel. Nodule in the gallbladder fossa (image in media). In the pelvis, filmy adhesions of the sigmoid colon to the left pelvic sidewall. Otherwise small, normal appearing uterus and bilateral fallopian tubes and ovaries. Right pelvic sidewall with enlarged lymph node, approximately 4x2cm, densely adherent to the pelvic sidewall, external iliac vein. Densely adherent and encasing the obturator nerve and artery. With careful dissection, unavoidable avulsion injury occurred to the obturator artery. Due to the densely adherent nature of the lymph node, the nerve and artery were initially inseparable. In obtaining control of the bleeding from the obturator artery, unavoidable crush and cautery injury to the obturator nerve. Following removal of the involved node, the affected portion of the obturator nerve was resected and well reapproximated. No other gross evidence of disease in the abdomen or pelvis.      PHYSICAL EXAMINATION: ECOG PERFORMANCE STATUS: 1 - Symptomatic but completely ambulatory  Vitals:   03/19/23 0958  BP: 120/60  Pulse: 64  Resp: 18  Temp: 97.9 F (36.6 C)  SpO2: 100%   Filed Weights    03/19/23 0958  Weight: 145 lb (65.8 kg)    GENERAL:alert, no distress and comfortable SKIN: skin color, texture, turgor are normal, no rashes or significant lesions EYES: normal, Conjunctiva are pink and non-injected, sclera clear OROPHARYNX:no exudate, no erythema and lips, buccal mucosa, and tongue normal  NECK: supple, thyroid normal size, non-tender, without nodularity LYMPH:  no palpable lymphadenopathy in the cervical, axillary or inguinal LUNGS: clear to auscultation and percussion with normal breathing effort HEART: regular rate & rhythm and no murmurs and no lower extremity edema ABDOMEN:abdomen soft, non-tender and normal bowel sounds Musculoskeletal:no cyanosis of digits and no clubbing  NEURO: alert & oriented x 3 with fluent speech, no focal motor/sensory deficits  LABORATORY DATA:  I have reviewed the data as listed    Component Value Date/Time   NA 139 03/19/2023 0941   K 4.0 03/19/2023 0941   CL 104 03/19/2023 0941   CO2 29 03/19/2023 0941   GLUCOSE 128 (H) 03/19/2023 0941   BUN 15 03/19/2023 0941   CREATININE 0.71 03/19/2023 0941   CREATININE 0.83 12/14/2016 1247   CALCIUM 9.7 03/19/2023 0941   PROT 7.1 03/19/2023 0941   ALBUMIN 4.2 03/19/2023 0941   AST 17 03/19/2023 0941  ALT 9 03/19/2023 0941   ALKPHOS 69 03/19/2023 0941   BILITOT 0.5 03/19/2023 0941   GFRNONAA >60 03/19/2023 0941   GFRNONAA 81 11/28/2015 1516   GFRAA >60 12/21/2015 0432   GFRAA >89 11/28/2015 1516    No results found for: "SPEP", "UPEP"  Lab Results  Component Value Date   WBC 4.0 03/19/2023   NEUTROABS 3.3 03/19/2023   HGB 11.3 (L) 03/19/2023   HCT 33.0 (L) 03/19/2023   MCV 92.7 03/19/2023   PLT 193 03/19/2023      Chemistry      Component Value Date/Time   NA 139 03/19/2023 0941   K 4.0 03/19/2023 0941   CL 104 03/19/2023 0941   CO2 29 03/19/2023 0941   BUN 15 03/19/2023 0941   CREATININE 0.71 03/19/2023 0941   CREATININE 0.83 12/14/2016 1247      Component  Value Date/Time   CALCIUM 9.7 03/19/2023 0941   ALKPHOS 69 03/19/2023 0941   AST 17 03/19/2023 0941   ALT 9 03/19/2023 0941   BILITOT 0.5 03/19/2023 0941

## 2023-03-19 NOTE — Assessment & Plan Note (Addendum)
I have reviewed her surgical report and pathology report The patient has complete response with neoadjuvant chemotherapy We discussed the role of adjuvant treatment as well as maintenance immunotherapy in the future Due to peripheral neuropathy, I plan to reduce the dose of her paclitaxel

## 2023-03-19 NOTE — Assessment & Plan Note (Signed)
This is likely due to recent treatment. The patient denies recent history of bleeding such as epistaxis, hematuria or hematochezia. She is asymptomatic from the anemia. I will observe for now.  She does not require transfusion now. I will continue the chemotherapy at current dose without dosage adjustment.  If the anemia gets progressive worse in the future, I might have to delay her treatment or adjust the chemotherapy dose.

## 2023-03-19 NOTE — Assessment & Plan Note (Addendum)
she has mild peripheral neuropathy, likely related to side effects of treatment. I plan to reduce the dose of treatment as outlined above.  I explained to the patient the rationale of this strategy and reassured the patient it would not compromise the efficacy of treatment I reduced the dose of gabapentin She will continue physical therapy and rehab

## 2023-04-08 MED FILL — Dexamethasone Sodium Phosphate Inj 100 MG/10ML: INTRAMUSCULAR | Qty: 1 | Status: AC

## 2023-04-08 MED FILL — Fosaprepitant Dimeglumine For IV Infusion 150 MG (Base Eq): INTRAVENOUS | Qty: 5 | Status: AC

## 2023-04-09 ENCOUNTER — Inpatient Hospital Stay: Payer: Medicare PPO

## 2023-04-09 ENCOUNTER — Encounter: Payer: Self-pay | Admitting: Hematology and Oncology

## 2023-04-09 ENCOUNTER — Inpatient Hospital Stay: Payer: Medicare PPO | Attending: Gynecologic Oncology | Admitting: Hematology and Oncology

## 2023-04-09 DIAGNOSIS — C55 Malignant neoplasm of uterus, part unspecified: Secondary | ICD-10-CM

## 2023-04-09 DIAGNOSIS — Z79899 Other long term (current) drug therapy: Secondary | ICD-10-CM | POA: Insufficient documentation

## 2023-04-09 DIAGNOSIS — C778 Secondary and unspecified malignant neoplasm of lymph nodes of multiple regions: Secondary | ICD-10-CM | POA: Diagnosis present

## 2023-04-09 DIAGNOSIS — C801 Malignant (primary) neoplasm, unspecified: Secondary | ICD-10-CM | POA: Insufficient documentation

## 2023-04-09 LAB — CBC WITH DIFFERENTIAL (CANCER CENTER ONLY)
Abs Immature Granulocytes: 0.02 10*3/uL (ref 0.00–0.07)
Basophils Absolute: 0 10*3/uL (ref 0.0–0.1)
Basophils Relative: 0 %
Eosinophils Absolute: 0 10*3/uL (ref 0.0–0.5)
Eosinophils Relative: 0 %
HCT: 34.4 % — ABNORMAL LOW (ref 36.0–46.0)
Hemoglobin: 11.8 g/dL — ABNORMAL LOW (ref 12.0–15.0)
Immature Granulocytes: 0 %
Lymphocytes Relative: 10 %
Lymphs Abs: 0.6 10*3/uL — ABNORMAL LOW (ref 0.7–4.0)
MCH: 32 pg (ref 26.0–34.0)
MCHC: 34.3 g/dL (ref 30.0–36.0)
MCV: 93.2 fL (ref 80.0–100.0)
Monocytes Absolute: 0.1 10*3/uL (ref 0.1–1.0)
Monocytes Relative: 1 %
Neutro Abs: 5 10*3/uL (ref 1.7–7.7)
Neutrophils Relative %: 89 %
Platelet Count: 231 10*3/uL (ref 150–400)
RBC: 3.69 MIL/uL — ABNORMAL LOW (ref 3.87–5.11)
RDW: 12.8 % (ref 11.5–15.5)
WBC Count: 5.7 10*3/uL (ref 4.0–10.5)
nRBC: 0 % (ref 0.0–0.2)

## 2023-04-09 LAB — CMP (CANCER CENTER ONLY)
ALT: 10 U/L (ref 0–44)
AST: 19 U/L (ref 15–41)
Albumin: 4.2 g/dL (ref 3.5–5.0)
Alkaline Phosphatase: 82 U/L (ref 38–126)
Anion gap: 7 (ref 5–15)
BUN: 12 mg/dL (ref 8–23)
CO2: 27 mmol/L (ref 22–32)
Calcium: 9.5 mg/dL (ref 8.9–10.3)
Chloride: 105 mmol/L (ref 98–111)
Creatinine: 0.78 mg/dL (ref 0.44–1.00)
GFR, Estimated: >60 mL/min (ref >60)
Glucose, Bld: 145 mg/dL — ABNORMAL HIGH (ref 70–99)
Potassium: 3.9 mmol/L (ref 3.5–5.1)
Sodium: 139 mmol/L (ref 135–145)
Total Bilirubin: 0.4 mg/dL (ref 0.3–1.2)
Total Protein: 7.2 g/dL (ref 6.5–8.1)

## 2023-04-09 MED ORDER — CETIRIZINE HCL 10 MG/ML IV SOLN
10.0000 mg | Freq: Once | INTRAVENOUS | Status: AC
  Administered 2023-04-09: 10 mg via INTRAVENOUS
  Filled 2023-04-09: qty 1

## 2023-04-09 MED ORDER — SODIUM CHLORIDE 0.9% FLUSH
10.0000 mL | Freq: Once | INTRAVENOUS | Status: AC
  Administered 2023-04-09: 10 mL

## 2023-04-09 MED ORDER — OMEPRAZOLE 40 MG PO CPDR: 40.0000 mg | DELAYED_RELEASE_CAPSULE | Freq: Every day | ORAL | 3 refills | Status: DC

## 2023-04-09 MED ORDER — FAMOTIDINE IN NACL 20-0.9 MG/50ML-% IV SOLN
20.0000 mg | Freq: Once | INTRAVENOUS | Status: AC
  Administered 2023-04-09: 20 mg via INTRAVENOUS
  Filled 2023-04-09: qty 50

## 2023-04-09 MED ORDER — SODIUM CHLORIDE 0.9 % IV SOLN
10.0000 mg | Freq: Once | INTRAVENOUS | Status: AC
  Administered 2023-04-09: 10 mg via INTRAVENOUS
  Filled 2023-04-09: qty 10

## 2023-04-09 MED ORDER — SODIUM CHLORIDE 0.9% FLUSH
10.0000 mL | INTRAVENOUS | Status: DC | PRN
  Administered 2023-04-09: 10 mL

## 2023-04-09 MED ORDER — SODIUM CHLORIDE 0.9 % IV SOLN: Freq: Once | INTRAVENOUS | Status: AC

## 2023-04-09 MED ORDER — SODIUM CHLORIDE 0.9 % IV SOLN
150.0000 mg | Freq: Once | INTRAVENOUS | Status: AC
  Administered 2023-04-09: 150 mg via INTRAVENOUS
  Filled 2023-04-09: qty 150

## 2023-04-09 MED ORDER — SODIUM CHLORIDE 0.9 % IV SOLN
402.5000 mg | Freq: Once | INTRAVENOUS | Status: AC
  Administered 2023-04-09: 400 mg via INTRAVENOUS
  Filled 2023-04-09: qty 40

## 2023-04-09 MED ORDER — SODIUM CHLORIDE 0.9 % IV SOLN
105.0000 mg/m2 | Freq: Once | INTRAVENOUS | Status: AC
  Administered 2023-04-09: 180 mg via INTRAVENOUS
  Filled 2023-04-09: qty 30

## 2023-04-09 MED ORDER — SODIUM CHLORIDE 0.9 % IV SOLN
200.0000 mg | Freq: Once | INTRAVENOUS | Status: AC
  Administered 2023-04-09: 200 mg via INTRAVENOUS
  Filled 2023-04-09: qty 200

## 2023-04-09 MED ORDER — PALONOSETRON HCL INJECTION 0.25 MG/5ML
0.2500 mg | Freq: Once | INTRAVENOUS | Status: AC
  Administered 2023-04-09: 0.25 mg via INTRAVENOUS
  Filled 2023-04-09: qty 5

## 2023-04-09 MED ORDER — HEPARIN SOD (PORK) LOCK FLUSH 100 UNIT/ML IV SOLN
500.0000 [IU] | Freq: Once | INTRAVENOUS | Status: AC | PRN
  Administered 2023-04-09: 500 [IU]

## 2023-04-09 NOTE — Patient Instructions (Signed)
[facility]  Discharge Instructions: Thank you for choosing [facility] to provide your oncology and hematology care.   If you have a lab appointment with the Cancer Center, please go directly to the Cancer Center and check in at the registration area.   Wear comfortable clothing and clothing appropriate for easy access to any Portacath or PICC line.   We strive to give you quality time with your provider. You may need to reschedule your appointment if you arrive late (15 or more minutes).  Arriving late affects you and other patients whose appointments are after yours.  Also, if you miss three or more appointments without notifying the office, you may be dismissed from the clinic at the provider's discretion.      For prescription refill requests, have your pharmacy contact our office and allow 72 hours for refills to be completed.    Today you received the following chemotherapy and/or immunotherapy agents: Keytruda/Taxol/Carboplatin      To help prevent nausea and vomiting after your treatment, we encourage you to take your nausea medication as directed.  BELOW ARE SYMPTOMS THAT SHOULD BE REPORTED IMMEDIATELY: *FEVER GREATER THAN 100.4 F (38 C) OR HIGHER *CHILLS OR SWEATING *NAUSEA AND VOMITING THAT IS NOT CONTROLLED WITH YOUR NAUSEA MEDICATION *UNUSUAL SHORTNESS OF BREATH *UNUSUAL BRUISING OR BLEEDING *URINARY PROBLEMS (pain or burning when urinating, or frequent urination) *BOWEL PROBLEMS (unusual diarrhea, constipation, pain near the anus) TENDERNESS IN MOUTH AND THROAT WITH OR WITHOUT PRESENCE OF ULCERS (sore throat, sores in mouth, or a toothache) UNUSUAL RASH, SWELLING OR PAIN  UNUSUAL VAGINAL DISCHARGE OR ITCHING   Items with * indicate a potential emergency and should be followed up as soon as possible or go to the Emergency Department if any problems should occur.  Please show the CHEMOTHERAPY ALERT CARD or IMMUNOTHERAPY  ALERT CARD at check-in to the Emergency Department and triage nurse.  Should you have questions after your visit or need to cancel or reschedule your appointment, please contact [facility]  Dept: [phone]  and follow the prompts.  Office hours are 8:00 a.m. to 4:30 p.m. Monday - Friday. Please note that voicemails left after 4:00 p.m. may not be returned until the following business day.  We are closed weekends and major holidays. You have access to a nurse at all times for urgent questions. Please call the main number to the clinic Dept: [phone] and follow the prompts.   For any non-urgent questions, you may also contact your provider using MyChart. We now offer e-Visits for anyone 18 and older to request care online for non-urgent symptoms. For details visit [email or web address].   Also download the MyChart app! Go to the app store, search "MyChart", open the app, select [place], and log in with your MyChart username and password.   

## 2023-04-09 NOTE — Progress Notes (Signed)
[facility] OFFICE PROGRESS NOTE  Patient Care Team: [name], MD as PCP - General (Family Medicine)  HISTORY OF PRESENTING ILLNESS: Discussed the use of AI scribe software for clinical note transcription with the patient, who gave verbal consent to proceed.  History of Present Illness   The patient, with a history of uterine cancer, presented for a follow-up after her last treatment. She reported no bone pain but experienced significant gastrointestinal discomfort. The patient described a sensation of stomach pain both when eating and when not eating, accompanied by excessive gas. She found relief from these symptoms by consuming Tums. The patient also reported an episode of increased bowel movements, with six occurrences in one day, associated with stomach cramps but not diarrhea. After this episode, the patient's appetite returned, and she began to feel better.  The patient also reported taking half a dose of oxycodone on a couple of occasions for knee pain due to daily walking. She has been managing her pain effectively with Tylenol and has not needed to supplement with Advil.  The patient has been on Eliquis for the past six months due to a previous diagnosis of a blood clot caused by her cancer. She inquired about the duration of this treatment.  The patient also noted the growth of "peach fuzz" and eyelashes and asked if these would fall out again with continued chemotherapy. She reported no neuropathy.  Lastly, the patient mentioned experiencing dry mouth and a constantly running nose, which she found puzzling.         Assessment and Plan    Uterine cancer One more cycle of chemotherapy remaining, then switch to immunotherapy. -Order CT scan after completion of chemotherapy. -Double immunotherapy dose to 400mg  every six weeks starting in December. -Discontinue steroid use in November.    Chemotherapy-induced gastritis Reports of stomach pain, gas, and  multiple bowel movements. No nausea or diarrhea. Likely related to steroid use with chemotherapy treatments. -Discontinue Advil due to potential for exacerbating gastritis. -Prescribe Omeprazole, to be taken half an hour before dinner, for 1-2 months until completion of chemotherapy.  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 -Continue Eliquis at current dose until supply is finished. -Reduce Eliquis dose to 2.5mg  twice daily until supply is finished. -Consider starting 81mg  Aspirin for secondary prevention after Eliquis is finished.  Chemotherapy-induced alopecia Reports of hair loss due to chemotherapy. -Expect hair loss to continue until completion of chemotherapy.          No orders of the defined types were placed in this encounter.   All questions were answered. The patient knows to call the clinic with any problems, questions or concerns. The total time spent in the appointment was 40 minutes encounter with patients including review of chart and various tests results, discussions about plan of care and coordination of care plan   Artis Delay, MD 04/09/2023 12:24 PM  REVIEW OF SYSTEMS:  All other systems were reviewed with the patient and are negative.  I have reviewed the past medical history, past surgical history, social history and family history with the patient and they are unchanged from previous note.  ALLERGIES:  is allergic to sulfa antibiotics.  MEDICATIONS:  Current Outpatient Medications  Medication Sig Dispense Refill   omeprazole (PRILOSEC) 40 MG capsule Take 1 capsule (40 mg total) by mouth daily. 30 capsule 3   acetaminophen (TYLENOL) 500 MG tablet Take 1,000 mg by mouth every 6 (six) hours as needed for  moderate pain.     calcium carbonate (TUMS) 500 MG chewable tablet Chew 1 tablet by mouth daily.     Carboxymethylcellulose Sodium (THERATEARS OP) Place 1 drop into both eyes 3 (three) times daily.      Cholecalciferol (VITAMIN D) 50 MCG (2000 UT) tablet Take 2,000 Units by mouth daily.     dexamethasone (DECADRON) 4 MG tablet Take 2 tabs at the night before and 2 tab the morning of chemotherapy, every 3 weeks, by mouth x 6 cycles 24 tablet 6   diclofenac sodium (VOLTAREN) 1 % GEL Apply 2-4 g topically 4 (four) times daily. (Patient taking differently: Apply 2-4 g topically 4 (four) times daily as needed (pain).) 3 Tube 3   ELIQUIS 5 MG TABS tablet Take 5 mg by mouth 2 (two) times daily.     gabapentin (NEURONTIN) 100 MG capsule Take 1 capsule (100 mg total) by mouth at bedtime. 30 capsule 1   Glucosamine 500 MG CAPS Take 500 mg by mouth 2 (two) times daily.     levothyroxine (SYNTHROID) 75 MCG tablet Take 1 tablet (75 mcg total) by mouth daily. 90 tablet 3   lidocaine-prilocaine (EMLA) cream Apply 1 Application topically as needed. 30 g 3   loratadine (CLARITIN) 10 MG tablet Take 10 mg by mouth See admin instructions. Take 10 mg daily for 5 days starting on the day of chemo. May take 10 mg daily as needed for allergies     melatonin 5 MG TABS Take 5 mg by mouth at bedtime as needed (sleep).     Multiple Vitamin (MULTIVITAMIN) tablet Take 1 tablet by mouth daily.     ondansetron (ZOFRAN) 8 MG tablet Take 1 tablet (8 mg total) by mouth every 8 (eight) hours as needed for nausea. 30 tablet 3   prochlorperazine (COMPAZINE) 10 MG tablet Take 1 tablet (10 mg total) by mouth every 6 (six) hours as needed for nausea or vomiting. 30 tablet 0   No current facility-administered medications for this visit.   Facility-Administered Medications Ordered in Other Visits  Medication Dose Route Frequency Provider Last Rate Last Admin   CARBOplatin (PARAPLATIN) 400 mg in sodium chloride 0.9 % 250 mL chemo infusion  400 mg Intravenous Once Bertis Ruddy, Luiscarlos Kaczmarczyk, MD       heparin lock flush 100 unit/mL  500 Units Intracatheter Once PRN Bertis Ruddy, Geary Rufo, MD       PACLitaxel (TAXOL) 180 mg in sodium chloride 0.9 % 250 mL chemo  infusion (> 80mg /m2)  105 mg/m2 (Treatment Plan Recorded) Intravenous Once Bertis Ruddy, Amaiya Scruton, MD       pembrolizumab (KEYTRUDA) 200 mg in sodium chloride 0.9 % 50 mL chemo infusion  200 mg Intravenous Once Bertis Ruddy, Abayomi Pattison, MD 116 mL/hr at 04/09/23 1209 200 mg at 04/09/23 1209   sodium chloride flush (NS) 0.9 % injection 10 mL  10 mL Intracatheter PRN Artis Delay, MD        SUMMARY OF ONCOLOGIC HISTORY: Oncology History Overview Note  Er neg, MSI stable, PD-L1 80% No genetic testing done, did not meet criteria   Uterine cancer (HCC)  10/23/2022 Imaging   1. 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 2. Multiple prominent palpable lymph nodes in the right groin measuring up to 2.1 cm, nonspecific. 3. Small right popliteal fossa Baker's cyst.   10/25/2022 Imaging   Ct imaging 1. Pelvic lymphadenopathy with the most prominent lymph node along the right iliac chain measuring 5.6 x 2.8 cm. Metastatic lymphadenopathy or  lymphoproliferative disease cannot be excluded.  2. 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 3. Prominent right inguinal lymph nodes. 4. 8 mm sclerotic bone lesion within the right ischium, indeterminate. 5. Aortic atherosclerosis.   Aortic Atherosclerosis (ICD10-I70.0).       11/11/2022 Pathology Results   SURGICAL PATHOLOGY CASE: [id number] PATIENT: Jacquita Lyon Surgical Pathology Report  Specimen Submitted: A. Lymph node, right pelvic  Clinical History: Pelvic lymphadenopathy  DIAGNOSIS: A. LYMPH NODE, RIGHT PELVIC; CT-GUIDED CORE NEEDLE BIOPSY: - METASTATIC POORLY DIFFERENTIATED CARCINOMA, SEE COMMENT.  Comment: Immunohistochemical studies show tumor cells to be positive for CK7, MOC-31, GATA-3, CK5/6, and calretinin (subset). P16 is strongly and diffusely  positive. P53 demonstrates an absence of staining within tumor cells, indicative of a null staining pattern. PAX-8, WT-1, D240, TTF-1, cdx-2, and p40 are negative. The pattern of immunohistochemical staining is non-specific. Based on the morphologic features and pattern of immunohistochemical staining the differential diagnosis includes metastatic urothelial carcinoma, metastatic breast carcinoma, and metastatic serous carcinoma of gyn origin. Correlation with radiographic findings is required.  There is sufficient tissue present for ancillary molecular testing.  IHC slides were prepared by [facility] for Molecular Biology and Pathology, [place], [place]. All controls stained appropriately.   11/16/2022 PET scan   1. Examination is positive for tracer avid adenopathy within the retroperitoneum, bilateral pelvis and right inguinal region. Findings are compatible with nodal metastasis. Primary neoplasm is not apparent on the current exam.  2. No signs of solid organ metastasis within the abdomen or pelvis. 3. No signs of tracer avid disease above the diaphragm. 4. Asymmetric subcutaneous edema is identified involving the visualized portions of the right lower extremity. 5. 5 mm perifissural nodule in the left mid lung is too small to characterize by PET-CT. 6. Increased uptake within both lobes of thyroid gland, right greater than left. Correlation with thyroid function tests advised. 7.  Aortic Atherosclerosis (ICD10-I70.0).   11/19/2022 Initial Diagnosis   Gynecologic malignancy (HCC)   11/20/2022 Cancer Staging   Staging form: Corpus Uteri - Carcinoma and Carcinosarcoma, AJCC 8th Edition - Clinical stage from 11/20/2022: FIGO Stage IVB (cTX, cN2a, pM1) - Signed by Artis Delay, MD on 11/20/2022 Stage prefix: Initial diagnosis   12/02/2022 Procedure   Ultrasound and fluoroscopically guided right internal jugular single lumen power port catheter insertion. Tip in the SVC/RA junction. Catheter ready for  use.   12/04/2022 - 12/04/2022 Chemotherapy   Patient is on Treatment Plan : UTERINE Carboplatin AUC 6 + Paclitaxel q21d     12/04/2022 -  Chemotherapy   Patient is on Treatment Plan : UTERINE Pembrolizumab (200), Paclitaxel (175), Carboplatin (5) q21d x 6 cycles / Pembrolizumab (400) q42d     12/15/2022 Imaging   1. No adnexal mass. No uterine mass lesion evident and there is no substantial thickening of the endometrium by MRI. Low signal intensity fibrous stroma of the cervix appears preserved although high signal intensity of the cervical mucosa is somewhat prominent. This could be correlated with Pap smear as clinically warranted. 2. Stable right pelvic sidewall and groin lymphadenopathy. 3. Small Bartholin's cysts bilaterally.   02/05/2023 Imaging   CT CHEST ABDOMEN PELVIS W CONTRAST  Result Date: 02/05/2023 CLINICAL DATA:  History of endometrial cancer, monitor. High-risk. * Tracking Code: BO * EXAM: CT CHEST, ABDOMEN, AND PELVIS WITH CONTRAST TECHNIQUE: Multidetector CT imaging of the chest,  abdomen and pelvis was performed following the standard protocol during bolus administration of intravenous contrast. RADIATION DOSE REDUCTION: This exam was performed according to the departmental dose-optimization program which includes automated exposure control, adjustment of the mA and/or kV according to patient size and/or use of iterative reconstruction technique. CONTRAST:  OMNIPAQUE IOHEXOL 300 MG/ML  SOLN COMPARISON:  Multiple priors including MRI pelvis December 15, 2022 PET-CT Nov 16, 2022 and CT abdomen pelvis October 25, 2022. FINDINGS: CT CHEST FINDINGS Cardiovascular: Right chest wall Port-A-Cath with the tip in the right atrium. Aortic atherosclerosis. Normal caliber thoracic aorta. No central pulmonary embolus on this nondedicated study. Normal size heart. No significant pericardial effusion/thickening Mediastinum/Nodes: No suspicious thyroid nodule. No pathologically enlarged mediastinal, hilar  or axillary lymph nodes the esophagus is grossly unremarkable. Lungs/Pleura: Stable scattered tiny pulmonary nodules. For instance: -2 mm right upper lobe pulmonary nodule on image 29/3, unchanged -2 mm pulmonary nodule along the right major fissure on image 73/3, unchanged -4 mm pulmonary nodule along the left major fissure on image 73/3, unchanged. No pleural effusion.  No pneumothorax. Musculoskeletal: No suspicious chest wall lesion. No aggressive lytic or blastic lesion of bone. CT ABDOMEN PELVIS FINDINGS Hepatobiliary: Diffuse hepatic steatosis. Gallbladder is unremarkable. No biliary ductal dilation Pancreas: No pancreatic ductal dilation or evidence of acute inflammation Spleen: No splenomegaly. Adrenals/Urinary Tract: Bilateral adrenal glands appear normal. No hydronephrosis. Kidneys demonstrate symmetric enhancement. No suspicious renal mass. Urinary bladder is not well evaluated due to underdistention and streak artifact from left hip arthroplasty. Stomach/Bowel: No radiopaque enteric contrast material was administered. Hyperdensity along the posterior aspect of the stomach on image 53/2 appears similar on delayed imaging and is compatible with ingested material. No pathologic dilation of large or small bowel. No evidence of acute bowel inflammation. Vascular/Lymphatic: Normal caliber abdominal aorta. Aortic atherosclerosis. The portal, splenic and superior mesenteric veins are patent. Decreased size of the retroperitoneal, bilateral pelvic sidewall/iliac side chain and right inguinal lymph nodes. For reference: -Retrocaval lymph node measures 6 mm in short axis on image 69/2 previously 11 mm -Right sidewall lymph node measures 1.5 cm in short axis on image 110/2 previously 2.9 cm Reproductive: Evaluation of the pelvic reproductive structures is limited by streak artifact from left hip arthroplasty. Other: No significant abdominopelvic free fluid. No discrete peritoneal or omental nodularity.  Musculoskeletal: No aggressive lytic or blastic lesion of bone. Multilevel degenerative changes spine. Left total hip arthroplasty. Degenerative change of the right hip. IMPRESSION: 1. Decreased size of the retroperitoneal, bilateral pelvic sidewall/iliac side chain and right inguinal lymph nodes, compatible with treatment response. 2. Stable scattered tiny pulmonary nodules, nonspecific but favored benign. Suggest continued attention on follow-up imaging 3. No evidence of new or progressive metastatic disease within the chest, abdomen or pelvis. 4. Diffuse hepatic steatosis. 5.  Aortic Atherosclerosis (ICD10-I70.0). Electronically Signed   By: Maudry Mayhew M.D.   On: 02/05/2023 15:03   XR Wrist Complete Right  Result Date: 01/13/2023 Right wrist  3 views: Right wrist distal radius fracture remains unchanged in overall position alignment.  Fracture is healed.  No acute findings otherwise.     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 UTERUS, CERVIX, BILATERAL FALLOPIAN TUBES, AND OVARIES, RESECTION: Cervix     Nabothian cysts     Negative for dysplasia or malignancy Endometrium     Atrophic  Negative for atypia, hyperplasia or malignancy Myometrium     Unremarkable with focal vascular calcifications     Negative for malignancy Right ovary     Focal paraovarian, inactive endometriosis     Negative for malignancy Left ovary     Unremarkable     Negative for endometriosis or malignancy Right fallopian tube     Not identified in entirely submitted right adnexa Left fallopian tube     Not identified in entirely submitted left adnexa  B. RIGHT PELVIC LYMPH NODE, EXCISION: Lymph node with hyalin fibrosis, focal necrosis and dystrophic calcifications Focal perinodal fat necrosis Negative for  residual, viable carcinoma See comment  C. SEGMENT OF OBTURATOR NERVE, EXCISION: Benign nerve Negative for malignancy  D. OMENTUM: Benign adipose tissue Negative for malignancy  COMMENT: B. The right pelvic lymph node is entirely submitted and shows large areas of hyalin fibrosis focally associated with necrosis and dystrophic calcifications.  The findings are consistent with tumor regression although no residual, viable carcinoma is identified.     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 No palpable right lymphadenopathy. On entry to abdomen, normal upper abdominal survey with smooth diaphragm, liver, stomach and normal appearing omentum and bowel. Nodule in the gallbladder fossa (image in media). In the pelvis, filmy adhesions of the sigmoid colon to the left pelvic sidewall. Otherwise small, normal appearing uterus and bilateral fallopian tubes and ovaries. Right pelvic sidewall with enlarged lymph node, approximately 4x2cm, densely adherent to the pelvic sidewall, external iliac vein. Densely adherent and encasing the obturator nerve and artery. With careful dissection, unavoidable avulsion injury occurred to the obturator artery. Due to the densely adherent nature of the lymph node, the nerve and artery were initially inseparable. In obtaining control of the bleeding from the obturator artery, unavoidable crush and cautery injury to the obturator nerve. Following removal of the involved node, the affected portion of the  obturator nerve was resected and well reapproximated. No other gross evidence of disease in the abdomen or pelvis.      PHYSICAL EXAMINATION: ECOG PERFORMANCE STATUS: 0 - Asymptomatic  Vitals:   04/09/23 0944  BP: 126/73  Pulse: 76  Resp: 18  SpO2: 100%   Filed Weights   04/09/23 0944  Weight: 145 lb 9.6 oz (66 kg)    GENERAL:alert, no distress and comfortable  LABORATORY DATA:  I have reviewed the data as listed    Component Value Date/Time   NA 139 04/09/2023 0909   K 3.9 04/09/2023 0909   CL 105 04/09/2023 0909   CO2 27 04/09/2023 0909   GLUCOSE 145 (H) 04/09/2023 0909   BUN 12 04/09/2023 0909   CREATININE 0.78 04/09/2023 0909   CREATININE 0.83 12/14/2016 1247   CALCIUM 9.5 04/09/2023 0909   PROT 7.2 04/09/2023 0909   ALBUMIN 4.2 04/09/2023 0909   AST 19 04/09/2023 0909   ALT 10 04/09/2023 0909   ALKPHOS 82 04/09/2023 0909   BILITOT 0.4 04/09/2023 0909   GFRNONAA >60 04/09/2023 0909   GFRNONAA 81 11/28/2015 1516   GFRAA >60 12/21/2015 0432   GFRAA >89 11/28/2015 1516  No results found for: "SPEP", "UPEP"  Lab Results  Component Value Date   WBC 5.7 04/09/2023   NEUTROABS 5.0 04/09/2023   HGB 11.8 (L) 04/09/2023   HCT 34.4 (L) 04/09/2023   MCV 93.2 04/09/2023   PLT 231 04/09/2023      Chemistry      Component Value Date/Time   NA 139 04/09/2023 0909   K 3.9 04/09/2023 0909   CL 105 04/09/2023 0909   CO2 27 04/09/2023 0909   BUN 12 04/09/2023 0909   CREATININE 0.78 04/09/2023 0909   CREATININE 0.83 12/14/2016 1247      Component Value Date/Time   CALCIUM 9.5 04/09/2023 0909   ALKPHOS 82 04/09/2023 0909   AST 19 04/09/2023 0909   ALT 10 04/09/2023 0909   BILITOT 0.4 04/09/2023 0909

## 2023-04-29 ENCOUNTER — Other Ambulatory Visit: Payer: Medicare PPO

## 2023-04-29 ENCOUNTER — Ambulatory Visit: Payer: Medicare PPO | Admitting: Hematology and Oncology

## 2023-04-29 ENCOUNTER — Ambulatory Visit: Payer: Medicare PPO

## 2023-04-30 ENCOUNTER — Encounter: Payer: Self-pay | Admitting: Hematology and Oncology

## 2023-05-05 MED FILL — Fosaprepitant Dimeglumine For IV Infusion 150 MG (Base Eq): INTRAVENOUS | Qty: 5 | Status: AC

## 2023-05-06 ENCOUNTER — Encounter: Payer: Self-pay | Admitting: Hematology and Oncology

## 2023-05-06 ENCOUNTER — Inpatient Hospital Stay: Payer: Medicare PPO | Attending: Gynecologic Oncology

## 2023-05-06 ENCOUNTER — Inpatient Hospital Stay: Payer: Medicare PPO

## 2023-05-06 ENCOUNTER — Other Ambulatory Visit: Payer: Self-pay

## 2023-05-06 ENCOUNTER — Inpatient Hospital Stay (HOSPITAL_BASED_OUTPATIENT_CLINIC_OR_DEPARTMENT_OTHER): Payer: Medicare PPO | Admitting: Hematology and Oncology

## 2023-05-06 ENCOUNTER — Telehealth: Payer: Self-pay

## 2023-05-06 VITALS — BP 117/80 | HR 70 | Temp 98.7°F | Resp 18 | Ht 62.0 in | Wt 145.8 lb

## 2023-05-06 DIAGNOSIS — Z79899 Other long term (current) drug therapy: Secondary | ICD-10-CM | POA: Diagnosis not present

## 2023-05-06 DIAGNOSIS — I82411 Acute embolism and thrombosis of right femoral vein: Secondary | ICD-10-CM

## 2023-05-06 DIAGNOSIS — M170 Bilateral primary osteoarthritis of knee: Secondary | ICD-10-CM | POA: Insufficient documentation

## 2023-05-06 DIAGNOSIS — C55 Malignant neoplasm of uterus, part unspecified: Secondary | ICD-10-CM | POA: Diagnosis not present

## 2023-05-06 DIAGNOSIS — C801 Malignant (primary) neoplasm, unspecified: Secondary | ICD-10-CM | POA: Insufficient documentation

## 2023-05-06 DIAGNOSIS — C778 Secondary and unspecified malignant neoplasm of lymph nodes of multiple regions: Secondary | ICD-10-CM | POA: Insufficient documentation

## 2023-05-06 DIAGNOSIS — Z5111 Encounter for antineoplastic chemotherapy: Secondary | ICD-10-CM | POA: Insufficient documentation

## 2023-05-06 DIAGNOSIS — Z86718 Personal history of other venous thrombosis and embolism: Secondary | ICD-10-CM | POA: Insufficient documentation

## 2023-05-06 DIAGNOSIS — R918 Other nonspecific abnormal finding of lung field: Secondary | ICD-10-CM | POA: Diagnosis not present

## 2023-05-06 DIAGNOSIS — Z7901 Long term (current) use of anticoagulants: Secondary | ICD-10-CM | POA: Insufficient documentation

## 2023-05-06 DIAGNOSIS — D649 Anemia, unspecified: Secondary | ICD-10-CM | POA: Diagnosis not present

## 2023-05-06 LAB — CMP (CANCER CENTER ONLY)
ALT: 10 U/L (ref 0–44)
AST: 18 U/L (ref 15–41)
Albumin: 4.4 g/dL (ref 3.5–5.0)
Alkaline Phosphatase: 79 U/L (ref 38–126)
Anion gap: 8 (ref 5–15)
BUN: 14 mg/dL (ref 8–23)
CO2: 29 mmol/L (ref 22–32)
Calcium: 9.6 mg/dL (ref 8.9–10.3)
Chloride: 101 mmol/L (ref 98–111)
Creatinine: 0.68 mg/dL (ref 0.44–1.00)
GFR, Estimated: >60 mL/min (ref >60)
Glucose, Bld: 131 mg/dL — ABNORMAL HIGH (ref 70–99)
Potassium: 4 mmol/L (ref 3.5–5.1)
Sodium: 138 mmol/L (ref 135–145)
Total Bilirubin: 0.6 mg/dL (ref <1.2)
Total Protein: 7.3 g/dL (ref 6.5–8.1)

## 2023-05-06 LAB — CBC WITH DIFFERENTIAL (CANCER CENTER ONLY)
Abs Immature Granulocytes: 0.01 10*3/uL (ref 0.00–0.07)
Basophils Absolute: 0 10*3/uL (ref 0.0–0.1)
Basophils Relative: 0 %
Eosinophils Absolute: 0 10*3/uL (ref 0.0–0.5)
Eosinophils Relative: 0 %
HCT: 33 % — ABNORMAL LOW (ref 36.0–46.0)
Hemoglobin: 11.2 g/dL — ABNORMAL LOW (ref 12.0–15.0)
Immature Granulocytes: 0 %
Lymphocytes Relative: 11 %
Lymphs Abs: 0.5 10*3/uL — ABNORMAL LOW (ref 0.7–4.0)
MCH: 31.5 pg (ref 26.0–34.0)
MCHC: 33.9 g/dL (ref 30.0–36.0)
MCV: 93 fL (ref 80.0–100.0)
Monocytes Absolute: 0.1 10*3/uL (ref 0.1–1.0)
Monocytes Relative: 2 %
Neutro Abs: 3.9 10*3/uL (ref 1.7–7.7)
Neutrophils Relative %: 87 %
Platelet Count: 201 10*3/uL (ref 150–400)
RBC: 3.55 MIL/uL — ABNORMAL LOW (ref 3.87–5.11)
RDW: 13.3 % (ref 11.5–15.5)
WBC Count: 4.4 10*3/uL (ref 4.0–10.5)
nRBC: 0 % (ref 0.0–0.2)

## 2023-05-06 LAB — TSH: TSH: 7.198 u[IU]/mL — ABNORMAL HIGH (ref 0.350–4.500)

## 2023-05-06 MED ORDER — HEPARIN SOD (PORK) LOCK FLUSH 100 UNIT/ML IV SOLN
500.0000 [IU] | Freq: Once | INTRAVENOUS | Status: AC | PRN
Start: 2023-05-06 — End: 2023-05-06
  Administered 2023-05-06: 500 [IU]

## 2023-05-06 MED ORDER — CETIRIZINE HCL 10 MG/ML IV SOLN
10.0000 mg | Freq: Once | INTRAVENOUS | Status: AC
  Administered 2023-05-06: 10 mg via INTRAVENOUS
  Filled 2023-05-06: qty 1

## 2023-05-06 MED ORDER — SODIUM CHLORIDE 0.9 % IV SOLN: Freq: Once | INTRAVENOUS | Status: AC

## 2023-05-06 MED ORDER — SODIUM CHLORIDE 0.9% FLUSH
10.0000 mL | Freq: Once | INTRAVENOUS | Status: AC
  Administered 2023-05-06: 10 mL

## 2023-05-06 MED ORDER — FAMOTIDINE IN NACL 20-0.9 MG/50ML-% IV SOLN
20.0000 mg | Freq: Once | INTRAVENOUS | Status: AC
Start: 2023-05-06 — End: 2023-05-06
  Administered 2023-05-06: 20 mg via INTRAVENOUS
  Filled 2023-05-06: qty 50

## 2023-05-06 MED ORDER — SODIUM CHLORIDE 0.9 % IV SOLN
150.0000 mg | Freq: Once | INTRAVENOUS | Status: AC
  Administered 2023-05-06: 150 mg via INTRAVENOUS
  Filled 2023-05-06: qty 150

## 2023-05-06 MED ORDER — DEXAMETHASONE SODIUM PHOSPHATE 10 MG/ML IJ SOLN
10.0000 mg | Freq: Once | INTRAMUSCULAR | Status: AC
  Administered 2023-05-06: 10 mg via INTRAVENOUS
  Filled 2023-05-06: qty 1

## 2023-05-06 MED ORDER — SODIUM CHLORIDE 0.9 % IV SOLN
200.0000 mg | Freq: Once | INTRAVENOUS | Status: AC
  Administered 2023-05-06: 200 mg via INTRAVENOUS
  Filled 2023-05-06: qty 200

## 2023-05-06 MED ORDER — CARBOPLATIN CHEMO INJECTION 600 MG/60ML
402.5000 mg | Freq: Once | INTRAVENOUS | Status: AC
  Administered 2023-05-06: 400 mg via INTRAVENOUS
  Filled 2023-05-06: qty 40

## 2023-05-06 MED ORDER — LEVOTHYROXINE SODIUM 100 MCG PO TABS: 100.0000 ug | ORAL_TABLET | Freq: Every day | ORAL | 3 refills | Status: DC

## 2023-05-06 MED ORDER — SODIUM CHLORIDE 0.9 % IV SOLN
105.0000 mg/m2 | Freq: Once | INTRAVENOUS | Status: AC
  Administered 2023-05-06: 180 mg via INTRAVENOUS
  Filled 2023-05-06: qty 30

## 2023-05-06 MED ORDER — SODIUM CHLORIDE 0.9% FLUSH
10.0000 mL | INTRAVENOUS | Status: DC | PRN
  Administered 2023-05-06: 10 mL

## 2023-05-06 MED ORDER — PALONOSETRON HCL INJECTION 0.25 MG/5ML
0.2500 mg | Freq: Once | INTRAVENOUS | Status: AC
  Administered 2023-05-06: 0.25 mg via INTRAVENOUS
  Filled 2023-05-06: qty 5

## 2023-05-06 NOTE — Telephone Encounter (Signed)
-----   Message from Artis Delay sent at 05/06/2023  3:50 PM EST ----- Her TSH is high due to treatment I can manage it or she can reach out to her endocrinologist I recommend increase to 100 mcg If she agrees, e-scribe 30 tabs with 3 refills to her pharmacy

## 2023-05-06 NOTE — Progress Notes (Signed)
Patient seen by Dr. Eugene Gavia are within treatment parameters.  Labs reviewed: and are within treatment parameters. Awaiting CMP results.   Per physician team, patient is ready for treatment and there are NO modifications to the treatment plan. Pending CMP results.

## 2023-05-06 NOTE — Assessment & Plan Note (Signed)
She has chronic bilateral knee pain worse with recent chemotherapy She has oxycodone to take as needed

## 2023-05-06 NOTE — Patient Instructions (Signed)
[facility] - A DEPT OF [facility]  Discharge Instructions: Thank you for choosing [facility] to provide your oncology and hematology care.   If you have a lab appointment with the Cancer Center, please go directly to the Cancer Center and check in at the registration area.   Wear comfortable clothing and clothing appropriate for easy access to any Portacath or PICC line.   We strive to give you quality time with your provider. You may need to reschedule your appointment if you arrive late (15 or more minutes).  Arriving late affects you and other patients whose appointments are after yours.  Also, if you miss three or more appointments without notifying the office, you may be dismissed from the clinic at the provider's discretion.      For prescription refill requests, have your pharmacy contact our office and allow 72 hours for refills to be completed.    Today you received the following chemotherapy and/or immunotherapy agents: Keytruda/Taxol/Carboplatin      To help prevent nausea and vomiting after your treatment, we encourage you to take your nausea medication as directed.  BELOW ARE SYMPTOMS THAT SHOULD BE REPORTED IMMEDIATELY: *FEVER GREATER THAN 100.4 F (38 C) OR HIGHER *CHILLS OR SWEATING *NAUSEA AND VOMITING THAT IS NOT CONTROLLED WITH YOUR NAUSEA MEDICATION *UNUSUAL SHORTNESS OF BREATH *UNUSUAL BRUISING OR BLEEDING *URINARY PROBLEMS (pain or burning when urinating, or frequent urination) *BOWEL PROBLEMS (unusual diarrhea, constipation, pain near the anus) TENDERNESS IN MOUTH AND THROAT WITH OR WITHOUT PRESENCE OF ULCERS (sore throat, sores in mouth, or a toothache) UNUSUAL RASH, SWELLING OR PAIN  UNUSUAL VAGINAL DISCHARGE OR ITCHING   Items with * indicate a potential emergency and should be followed up as soon as possible or go to the Emergency Department if any problems should occur.  Please show the CHEMOTHERAPY ALERT CARD  or IMMUNOTHERAPY ALERT CARD at check-in to the Emergency Department and triage nurse.  Should you have questions after your visit or need to cancel or reschedule your appointment, please contact [facility] - A DEPT OF [facility]  Dept: [phone]  and follow the prompts.  Office hours are 8:00 a.m. to 4:30 p.m. Monday - Friday. Please note that voicemails left after 4:00 p.m. may not be returned until the following business day.  We are closed weekends and major holidays. You have access to a nurse at all times for urgent questions. Please call the main number to the clinic Dept: [phone] and follow the prompts.   For any non-urgent questions, you may also contact your provider using MyChart. We now offer e-Visits for anyone 87 and older to request care online for non-urgent symptoms. For details visit [email or web address].   Also download the MyChart app! Go to the app store, search "MyChart", open the app, select [facility], and log in with your MyChart username and password.

## 2023-05-06 NOTE — Assessment & Plan Note (Signed)
She is doing well with anticoagulation therapy Plan to discontinue anticoagulation therapy at the end of the year

## 2023-05-06 NOTE — Assessment & Plan Note (Signed)
This is likely due to recent treatment. The patient denies recent history of bleeding such as epistaxis, hematuria or hematochezia. She is asymptomatic from the anemia. I will observe for now.  She does not require transfusion now. I will continue the chemotherapy at current dose without dosage adjustment.  If the anemia gets progressive worse in the future, I might have to delay her treatment or adjust the chemotherapy dose.  

## 2023-05-06 NOTE — Progress Notes (Signed)
[facility] OFFICE PROGRESS NOTE  Patient Care Team: Lisa Carson, MD as PCP - General (Family Medicine)  ASSESSMENT & PLAN:  Uterine cancer [facility]) She tolerated recent treatment well except for mild knee pain which I do not believe is related to treatment side effects She will complete last cycle of chemotherapy today before switching over to immunotherapy next month I plan to order imaging study before I see her back  Acute deep vein thrombosis (DVT) of right lower extremity (HCC) She is doing well with anticoagulation therapy Plan to discontinue anticoagulation therapy at the end of the year  Mild anemia This is likely due to recent treatment. The patient denies recent history of bleeding such as epistaxis, hematuria or hematochezia. She is asymptomatic from the anemia. I will observe for now.  She does not require transfusion now. I will continue the chemotherapy at current dose without dosage adjustment.  If the anemia gets progressive worse in the future, I might have to delay her treatment or adjust the chemotherapy dose.   Bilateral primary osteoarthritis of knee She has chronic bilateral knee pain worse with recent chemotherapy She has oxycodone to take as needed  Orders Placed This Encounter  Procedures   CT CHEST ABDOMEN PELVIS W CONTRAST    Standing Status:   Future    Standing Expiration Date:   05/05/2024    Scheduling Instructions:     No need oral contrast    Order Specific Question:   If indicated for the ordered procedure, I authorize the administration of contrast media per Radiology protocol    Answer:   Yes    Order Specific Question:   Does the patient have a contrast media/X-ray dye allergy?    Answer:   No    Order Specific Question:   Preferred imaging location?    Answer:   [facility] Specific Question:   If indicated for the ordered procedure, I authorize the administration of oral contrast media per Radiology protocol     Answer:   Yes    All questions were answered. The patient knows to call the clinic with any problems, questions or concerns. The total time spent in the appointment was 30 minutes encounter with patients including review of chart and various tests results, discussions about plan of care and coordination of care plan   Artis Delay, MD 05/06/2023 10:38 AM  INTERVAL HISTORY: Please see below for problem oriented charting. she returns for chemotherapy, cycle 6 today She is here accompanied by her husband With her last treatment, she denies worsening neuropathy She has exacerbation of bilateral chronic knee pain She has noted 1 episode of vaginal bleeding but that has since stopped No recent nausea or constipation We discussed timing of next imaging study  REVIEW OF SYSTEMS:   Constitutional: Denies fevers, chills or abnormal weight loss Eyes: Denies blurriness of vision Ears, nose, mouth, throat, and face: Denies mucositis or sore throat Respiratory: Denies cough, dyspnea or wheezes Cardiovascular: Denies palpitation, chest discomfort or lower extremity swelling Gastrointestinal:  Denies nausea, heartburn or change in bowel habits Skin: Denies abnormal skin rashes Lymphatics: Denies new lymphadenopathy or easy bruising Neurological:Denies numbness, tingling or new weaknesses Behavioral/Psych: Mood is stable, no new changes  All other systems were reviewed with the patient and are negative.  I have reviewed the past medical history, past surgical history, social history and family history with the patient and they are unchanged from previous note.  ALLERGIES:  is allergic to sulfa antibiotics.  MEDICATIONS:  Current Outpatient Medications  Medication Sig Dispense Refill   acetaminophen (TYLENOL) 500 MG tablet Take 1,000 mg by mouth every 6 (six) hours as needed for moderate pain.     calcium carbonate (TUMS) 500 MG chewable tablet Chew 1 tablet by mouth daily.      Carboxymethylcellulose Sodium (THERATEARS OP) Place 1 drop into both eyes 3 (three) times daily.     Cholecalciferol (VITAMIN D) 50 MCG (2000 UT) tablet Take 2,000 Units by mouth daily.     dexamethasone (DECADRON) 4 MG tablet Take 2 tabs at the night before and 2 tab the morning of chemotherapy, every 3 weeks, by mouth x 6 cycles 24 tablet 6   diclofenac sodium (VOLTAREN) 1 % GEL Apply 2-4 g topically 4 (four) times daily. (Patient taking differently: Apply 2-4 g topically 4 (four) times daily as needed (pain).) 3 Tube 3   ELIQUIS 5 MG TABS tablet Take 5 mg by mouth 2 (two) times daily.     gabapentin (NEURONTIN) 100 MG capsule Take 1 capsule (100 mg total) by mouth at bedtime. 30 capsule 1   Glucosamine 500 MG CAPS Take 500 mg by mouth 2 (two) times daily.     levothyroxine (SYNTHROID) 75 MCG tablet Take 1 tablet (75 mcg total) by mouth daily. 90 tablet 3   lidocaine-prilocaine (EMLA) cream Apply 1 Application topically as needed. 30 g 3   loratadine (CLARITIN) 10 MG tablet Take 10 mg by mouth See admin instructions. Take 10 mg daily for 5 days starting on the day of chemo. May take 10 mg daily as needed for allergies     melatonin 5 MG TABS Take 5 mg by mouth at bedtime as needed (sleep).     Multiple Vitamin (MULTIVITAMIN) tablet Take 1 tablet by mouth daily.     omeprazole (PRILOSEC) 40 MG capsule Take 1 capsule (40 mg total) by mouth daily. 30 capsule 3   ondansetron (ZOFRAN) 8 MG tablet Take 1 tablet (8 mg total) by mouth every 8 (eight) hours as needed for nausea. 30 tablet 3   prochlorperazine (COMPAZINE) 10 MG tablet Take 1 tablet (10 mg total) by mouth every 6 (six) hours as needed for nausea or vomiting. 30 tablet 0   No current facility-administered medications for this visit.    SUMMARY OF ONCOLOGIC HISTORY: Oncology History Overview Note  Er neg, MSI stable, PD-L1 80% No genetic testing done, did not meet criteria   Uterine cancer (HCC)  10/23/2022 Imaging   1. Nonocclusive  thrombus of the right common femoral vein and saphenofemoral junction. 2. Multiple prominent palpable lymph nodes in the right groin measuring up to 2.1 cm, nonspecific. 3. Small right popliteal fossa Baker's cyst.   10/25/2022 Imaging   Ct imaging 1. Pelvic lymphadenopathy with the most prominent lymph node along the right iliac chain measuring 5.6 x 2.8 cm. Metastatic lymphadenopathy or lymphoproliferative disease cannot be excluded.  2. 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 3. Prominent right inguinal lymph nodes. 4. 8 mm sclerotic bone lesion within the right ischium, indeterminate. 5. Aortic atherosclerosis.   Aortic Atherosclerosis (ICD10-I70.0).       11/11/2022 Pathology Results   SURGICAL PATHOLOGY CASE: [id number] PATIENT: [name] Surgical Pathology Report  Specimen Submitted: A. Lymph node, right pelvic  Clinical History: Pelvic lymphadenopathy  DIAGNOSIS:  A. LYMPH NODE, RIGHT PELVIC; CT-GUIDED CORE NEEDLE BIOPSY: - METASTATIC POORLY DIFFERENTIATED CARCINOMA, SEE COMMENT.  Comment: Immunohistochemical studies show tumor cells to be positive for CK7, MOC-31, GATA-3, CK5/6, and calretinin (subset). P16 is strongly and diffusely positive. P53 demonstrates an absence of staining within tumor cells, indicative of a null staining pattern. PAX-8, WT-1, D240, TTF-1, cdx-2, and p40 are negative. The pattern of immunohistochemical staining is non-specific. Based on the morphologic features and pattern of immunohistochemical staining the differential diagnosis includes metastatic urothelial carcinoma, metastatic breast carcinoma, and metastatic serous carcinoma of gyn origin. Correlation with radiographic findings is required.  There is sufficient tissue present for ancillary molecular testing.  IHC  slides were prepared by [facility], [place], [place]. All controls stained appropriately.   11/16/2022 PET scan   1. Examination is positive for tracer avid adenopathy within the retroperitoneum, bilateral pelvis and right inguinal region. Findings are compatible with nodal metastasis. Primary neoplasm is not apparent on the current exam.  2. No signs of solid organ metastasis within the abdomen or pelvis. 3. No signs of tracer avid disease above the diaphragm. 4. Asymmetric subcutaneous edema is identified involving the visualized portions of the right lower extremity. 5. 5 mm perifissural nodule in the left mid lung is too small to characterize by PET-CT. 6. Increased uptake within both lobes of thyroid gland, right greater than left. Correlation with thyroid function tests advised. 7.  Aortic Atherosclerosis (ICD10-I70.0).   11/19/2022 Initial Diagnosis   Gynecologic malignancy (HCC)   11/20/2022 Cancer Staging   Staging form: Corpus Uteri - Carcinoma and Carcinosarcoma, AJCC 8th Edition - Clinical stage from 11/20/2022: FIGO Stage IVB (cTX, cN2a, pM1) - Signed by Artis Delay, MD on 11/20/2022 Stage prefix: Initial diagnosis   12/02/2022 Procedure   Ultrasound and fluoroscopically guided right internal jugular single lumen power port catheter insertion. Tip in the SVC/RA junction. Catheter ready for use.   12/04/2022 - 12/04/2022 Chemotherapy   Patient is on Treatment Plan : UTERINE Carboplatin AUC 6 + Paclitaxel q21d     12/04/2022 -  Chemotherapy   Patient is on Treatment Plan : UTERINE Pembrolizumab (200), Paclitaxel (175), Carboplatin (5) q21d x 6 cycles / Pembrolizumab (400) q42d     12/15/2022 Imaging   1. No adnexal mass. No uterine mass lesion evident and there is no substantial thickening of the endometrium by MRI. Low signal intensity fibrous stroma of the cervix appears preserved although high signal intensity of the cervical mucosa is somewhat  prominent. This could be correlated with Pap smear as clinically warranted. 2. Stable right pelvic sidewall and groin lymphadenopathy. 3. Small Bartholin's cysts bilaterally.   02/05/2023 Imaging   CT CHEST ABDOMEN PELVIS W CONTRAST  Result Date: 02/05/2023 CLINICAL DATA:  History of endometrial cancer, monitor. High-risk. * Tracking Code: BO * EXAM: CT CHEST, ABDOMEN, AND PELVIS WITH CONTRAST TECHNIQUE: Multidetector CT imaging of the chest, abdomen and pelvis was performed following the standard protocol during bolus administration of intravenous contrast. RADIATION DOSE REDUCTION: This exam was performed according to the departmental dose-optimization program which includes automated exposure control, adjustment of the mA and/or kV according to patient size and/or use of iterative reconstruction technique. CONTRAST:  OMNIPAQUE IOHEXOL 300 MG/ML  SOLN COMPARISON:  Multiple priors including MRI pelvis December 15, 2022 PET-CT Nov 16, 2022 and CT abdomen pelvis October 25, 2022. FINDINGS: CT CHEST FINDINGS Cardiovascular: Right chest wall Port-A-Cath with the tip in the right atrium. Aortic atherosclerosis. Normal caliber  thoracic aorta. No central pulmonary embolus on this nondedicated study. Normal size heart. No significant pericardial effusion/thickening Mediastinum/Nodes: No suspicious thyroid nodule. No pathologically enlarged mediastinal, hilar or axillary lymph nodes the esophagus is grossly unremarkable. Lungs/Pleura: Stable scattered tiny pulmonary nodules. For instance: -2 mm right upper lobe pulmonary nodule on image 29/3, unchanged -2 mm pulmonary nodule along the right major fissure on image 73/3, unchanged -4 mm pulmonary nodule along the left major fissure on image 73/3, unchanged. No pleural effusion.  No pneumothorax. Musculoskeletal: No suspicious chest wall lesion. No aggressive lytic or blastic lesion of bone. CT ABDOMEN PELVIS FINDINGS Hepatobiliary: Diffuse hepatic steatosis. Gallbladder  is unremarkable. No biliary ductal dilation Pancreas: No pancreatic ductal dilation or evidence of acute inflammation Spleen: No splenomegaly. Adrenals/Urinary Tract: Bilateral adrenal glands appear normal. No hydronephrosis. Kidneys demonstrate symmetric enhancement. No suspicious renal mass. Urinary bladder is not well evaluated due to underdistention and streak artifact from left hip arthroplasty. Stomach/Bowel: No radiopaque enteric contrast material was administered. Hyperdensity along the posterior aspect of the stomach on image 53/2 appears similar on delayed imaging and is compatible with ingested material. No pathologic dilation of large or small bowel. No evidence of acute bowel inflammation. Vascular/Lymphatic: Normal caliber abdominal aorta. Aortic atherosclerosis. The portal, splenic and superior mesenteric veins are patent. Decreased size of the retroperitoneal, bilateral pelvic sidewall/iliac side chain and right inguinal lymph nodes. For reference: -Retrocaval lymph node measures 6 mm in short axis on image 69/2 previously 11 mm -Right sidewall lymph node measures 1.5 cm in short axis on image 110/2 previously 2.9 cm Reproductive: Evaluation of the pelvic reproductive structures is limited by streak artifact from left hip arthroplasty. Other: No significant abdominopelvic free fluid. No discrete peritoneal or omental nodularity. Musculoskeletal: No aggressive lytic or blastic lesion of bone. Multilevel degenerative changes spine. Left total hip arthroplasty. Degenerative change of the right hip. IMPRESSION: 1. Decreased size of the retroperitoneal, bilateral pelvic sidewall/iliac side chain and right inguinal lymph nodes, compatible with treatment response. 2. Stable scattered tiny pulmonary nodules, nonspecific but favored benign. Suggest continued attention on follow-up imaging 3. No evidence of new or progressive metastatic disease within the chest, abdomen or pelvis. 4. Diffuse hepatic steatosis.  5.  Aortic Atherosclerosis (ICD10-I70.0). Electronically Signed   By: Maudry Mayhew M.D.   On: 02/05/2023 15:03   XR Wrist Complete Right  Result Date: 01/13/2023 Right wrist  3 views: Right wrist distal radius fracture remains unchanged in overall position alignment.  Fracture is healed.  No acute findings otherwise.     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 UTERUS, CERVIX, BILATERAL FALLOPIAN TUBES, AND OVARIES, RESECTION: Cervix     Nabothian cysts     Negative for dysplasia or malignancy Endometrium     Atrophic     Negative for atypia, hyperplasia or malignancy Myometrium     Unremarkable with focal vascular calcifications     Negative for malignancy Right ovary     Focal paraovarian, inactive endometriosis     Negative for malignancy Left ovary     Unremarkable     Negative for endometriosis or malignancy Right fallopian tube     Not identified in entirely submitted right adnexa Left fallopian tube     Not identified in entirely submitted left adnexa  B. RIGHT PELVIC LYMPH NODE, EXCISION: Lymph node with hyalin fibrosis, focal  necrosis and dystrophic calcifications Focal perinodal fat necrosis Negative for residual, viable carcinoma See comment  C. SEGMENT OF OBTURATOR NERVE, EXCISION: Benign nerve Negative for malignancy  D. OMENTUM: Benign adipose tissue Negative for malignancy  COMMENT: B. The right pelvic lymph node is entirely submitted and shows large areas of hyalin fibrosis focally associated with necrosis and dystrophic calcifications.  The findings are consistent with tumor regression although no residual, viable carcinoma is identified.     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 No palpable right lymphadenopathy. On entry to abdomen, normal upper abdominal survey with smooth diaphragm, liver, stomach and normal appearing omentum and bowel. Nodule in the gallbladder fossa (image in media). In the pelvis, filmy adhesions of the sigmoid colon to the left pelvic sidewall. Otherwise small, normal appearing uterus and bilateral fallopian tubes and ovaries. Right pelvic sidewall with enlarged lymph node, approximately 4x2cm, densely adherent to the pelvic sidewall, external iliac vein. Densely adherent and encasing the obturator nerve and artery. With careful dissection, unavoidable avulsion injury occurred to the obturator artery. Due to the densely adherent nature of the lymph node, the nerve and artery were initially inseparable. In obtaining control of the bleeding from the obturator artery, unavoidable crush and cautery injury to the obturator nerve. Following removal of the involved node, the affected portion of the obturator nerve was resected and well reapproximated. No other gross evidence of disease in the abdomen or pelvis.      PHYSICAL EXAMINATION: ECOG PERFORMANCE STATUS: 1 - Symptomatic but completely ambulatory  Vitals:   05/06/23 0956  BP: 117/80  Pulse: 70  Resp: 18  Temp: 98.7 F (37.1 C)  SpO2: 98%   Filed Weights   05/06/23 0956  Weight: 145 lb 12.8 oz (66.1 kg)    GENERAL:alert, no distress and comfortable   LABORATORY DATA:  I have reviewed the data as listed    Component Value Date/Time   NA 139 04/09/2023 0909   K 3.9 04/09/2023 0909   CL  105 04/09/2023 0909   CO2 27 04/09/2023 0909   GLUCOSE 145 (H) 04/09/2023 0909   BUN 12 04/09/2023 0909   CREATININE 0.78 04/09/2023 0909   CREATININE 0.83 12/14/2016 1247   CALCIUM 9.5 04/09/2023 0909   PROT 7.2 04/09/2023 0909   ALBUMIN 4.2 04/09/2023 0909   AST 19 04/09/2023 0909   ALT 10 04/09/2023 0909   ALKPHOS 82 04/09/2023 0909   BILITOT 0.4 04/09/2023 0909   GFRNONAA >60 04/09/2023 0909   GFRNONAA 81 11/28/2015 1516   GFRAA >60 12/21/2015 0432   GFRAA >89 11/28/2015 1516    No results found for: "SPEP", "UPEP"  Lab Results  Component Value Date   WBC 4.4 05/06/2023   NEUTROABS 3.9 05/06/2023   HGB 11.2 (L) 05/06/2023   HCT 33.0 (L) 05/06/2023   MCV 93.0 05/06/2023   PLT 201 05/06/2023      Chemistry      Component Value Date/Time   NA 139 04/09/2023 0909   K 3.9 04/09/2023 0909   CL 105 04/09/2023 0909   CO2 27 04/09/2023 0909   BUN 12  04/09/2023 0909   CREATININE 0.78 04/09/2023 0909   CREATININE 0.83 12/14/2016 1247      Component Value Date/Time   CALCIUM 9.5 04/09/2023 0909   ALKPHOS 82 04/09/2023 0909   AST 19 04/09/2023 0909   ALT 10 04/09/2023 0909   BILITOT 0.4 04/09/2023 0909

## 2023-05-06 NOTE — Assessment & Plan Note (Signed)
She tolerated recent treatment well except for mild knee pain which I do not believe is related to treatment side effects She will complete last cycle of chemotherapy today before switching over to immunotherapy next month I plan to order imaging study before I see her back

## 2023-05-06 NOTE — Telephone Encounter (Signed)
Patient called and made aware of elevated TSH levels.  Patient would like for Dr. Bertis Ruddy to manage synthroid and is agreeable to increased prescription being sent to her preferred pharmacy. Prescription changed and sent in to [facility].

## 2023-05-07 LAB — T4: T4, Total: 6.7 ug/dL (ref 4.5–12.0)

## 2023-05-21 ENCOUNTER — Other Ambulatory Visit: Payer: Medicare PPO

## 2023-05-21 ENCOUNTER — Inpatient Hospital Stay: Payer: Medicare PPO | Attending: Gynecologic Oncology

## 2023-05-21 ENCOUNTER — Ambulatory Visit: Payer: Medicare PPO | Admitting: Hematology and Oncology

## 2023-05-21 ENCOUNTER — Ambulatory Visit (HOSPITAL_BASED_OUTPATIENT_CLINIC_OR_DEPARTMENT_OTHER)
Admission: RE | Admit: 2023-05-21 | Discharge: 2023-05-21 | Disposition: A | Discharge status: Home / Self Care | Payer: Medicare PPO | Source: Ambulatory Visit | Attending: Hematology and Oncology | Admitting: Hematology and Oncology

## 2023-05-21 ENCOUNTER — Ambulatory Visit: Payer: Medicare PPO

## 2023-05-21 VITALS — BP 119/75 | HR 58 | Temp 98.7°F | Resp 17

## 2023-05-21 DIAGNOSIS — R918 Other nonspecific abnormal finding of lung field: Secondary | ICD-10-CM | POA: Diagnosis present

## 2023-05-21 DIAGNOSIS — Z452 Encounter for adjustment and management of vascular access device: Secondary | ICD-10-CM | POA: Insufficient documentation

## 2023-05-21 DIAGNOSIS — C55 Malignant neoplasm of uterus, part unspecified: Secondary | ICD-10-CM

## 2023-05-21 DIAGNOSIS — Z8542 Personal history of malignant neoplasm of other parts of uterus: Secondary | ICD-10-CM | POA: Insufficient documentation

## 2023-05-21 MED ORDER — SODIUM CHLORIDE 0.9% FLUSH
10.0000 mL | Freq: Once | INTRAVENOUS | Status: AC
  Administered 2023-05-21: 10 mL

## 2023-05-21 MED ORDER — HEPARIN SOD (PORK) LOCK FLUSH 100 UNIT/ML IV SOLN
500.0000 [IU] | Freq: Once | INTRAVENOUS | Status: AC
  Administered 2023-05-21: 500 [IU]

## 2023-05-21 MED ORDER — IOHEXOL 300 MG/ML  SOLN
100.0000 mL | Freq: Once | INTRAMUSCULAR | Status: AC | PRN
  Administered 2023-05-21: 100 mL via INTRAVENOUS

## 2023-05-21 NOTE — Patient Instructions (Signed)

## 2023-05-21 NOTE — Progress Notes (Signed)
Pt was accessed in CT for a scan. Pt presented to clinic for deaccess. Port flushed per protocol without difficulty.

## 2023-05-24 ENCOUNTER — Encounter: Payer: Self-pay | Admitting: Hematology and Oncology

## 2023-06-01 ENCOUNTER — Other Ambulatory Visit: Payer: Self-pay

## 2023-06-01 ENCOUNTER — Inpatient Hospital Stay: Payer: Medicare PPO | Attending: Gynecologic Oncology

## 2023-06-01 ENCOUNTER — Inpatient Hospital Stay: Payer: Medicare PPO | Admitting: Hematology and Oncology

## 2023-06-01 ENCOUNTER — Inpatient Hospital Stay: Payer: Medicare PPO

## 2023-06-01 ENCOUNTER — Encounter: Payer: Self-pay | Admitting: Hematology and Oncology

## 2023-06-01 VITALS — BP 112/79 | HR 82 | Temp 99.3°F | Resp 18 | Ht 62.0 in | Wt 145.6 lb

## 2023-06-01 DIAGNOSIS — R109 Unspecified abdominal pain: Secondary | ICD-10-CM

## 2023-06-01 DIAGNOSIS — Z7901 Long term (current) use of anticoagulants: Secondary | ICD-10-CM | POA: Diagnosis not present

## 2023-06-01 DIAGNOSIS — Z79899 Other long term (current) drug therapy: Secondary | ICD-10-CM | POA: Diagnosis not present

## 2023-06-01 DIAGNOSIS — C55 Malignant neoplasm of uterus, part unspecified: Secondary | ICD-10-CM

## 2023-06-01 DIAGNOSIS — Z8542 Personal history of malignant neoplasm of other parts of uterus: Secondary | ICD-10-CM | POA: Diagnosis present

## 2023-06-01 DIAGNOSIS — Z86718 Personal history of other venous thrombosis and embolism: Secondary | ICD-10-CM | POA: Insufficient documentation

## 2023-06-01 DIAGNOSIS — D649 Anemia, unspecified: Secondary | ICD-10-CM

## 2023-06-01 DIAGNOSIS — I82411 Acute embolism and thrombosis of right femoral vein: Secondary | ICD-10-CM | POA: Diagnosis not present

## 2023-06-01 LAB — CMP (CANCER CENTER ONLY)
ALT: 9 U/L (ref 0–44)
AST: 17 U/L (ref 15–41)
Albumin: 4 g/dL (ref 3.5–5.0)
Alkaline Phosphatase: 72 U/L (ref 38–126)
Anion gap: 6 (ref 5–15)
BUN: 18 mg/dL (ref 8–23)
CO2: 30 mmol/L (ref 22–32)
Calcium: 9.1 mg/dL (ref 8.9–10.3)
Chloride: 104 mmol/L (ref 98–111)
Creatinine: 0.76 mg/dL (ref 0.44–1.00)
GFR, Estimated: >60 mL/min (ref >60)
Glucose, Bld: 86 mg/dL (ref 70–99)
Potassium: 3.8 mmol/L (ref 3.5–5.1)
Sodium: 140 mmol/L (ref 135–145)
Total Bilirubin: 0.4 mg/dL (ref <1.2)
Total Protein: 6.3 g/dL — ABNORMAL LOW (ref 6.5–8.1)

## 2023-06-01 LAB — CBC WITH DIFFERENTIAL (CANCER CENTER ONLY)
Abs Immature Granulocytes: 0.01 10*3/uL (ref 0.00–0.07)
Basophils Absolute: 0 10*3/uL (ref 0.0–0.1)
Basophils Relative: 1 %
Eosinophils Absolute: 0.2 10*3/uL (ref 0.0–0.5)
Eosinophils Relative: 4 %
HCT: 30.7 % — ABNORMAL LOW (ref 36.0–46.0)
Hemoglobin: 10.4 g/dL — ABNORMAL LOW (ref 12.0–15.0)
Immature Granulocytes: 0 %
Lymphocytes Relative: 25 %
Lymphs Abs: 1 10*3/uL (ref 0.7–4.0)
MCH: 31.9 pg (ref 26.0–34.0)
MCHC: 33.9 g/dL (ref 30.0–36.0)
MCV: 94.2 fL (ref 80.0–100.0)
Monocytes Absolute: 0.4 10*3/uL (ref 0.1–1.0)
Monocytes Relative: 9 %
Neutro Abs: 2.4 10*3/uL (ref 1.7–7.7)
Neutrophils Relative %: 61 %
Platelet Count: 167 10*3/uL (ref 150–400)
RBC: 3.26 MIL/uL — ABNORMAL LOW (ref 3.87–5.11)
RDW: 13.9 % (ref 11.5–15.5)
WBC Count: 4 10*3/uL (ref 4.0–10.5)
nRBC: 0 % (ref 0.0–0.2)

## 2023-06-01 LAB — TSH: TSH: 5.086 u[IU]/mL — ABNORMAL HIGH (ref 0.350–4.500)

## 2023-06-01 MED ORDER — SODIUM CHLORIDE 0.9% FLUSH
10.0000 mL | INTRAVENOUS | Status: DC | PRN
  Administered 2023-06-01: 10 mL

## 2023-06-01 MED ORDER — HEPARIN SOD (PORK) LOCK FLUSH 100 UNIT/ML IV SOLN: 500.0000 [IU] | Freq: Once | INTRAVENOUS | Status: DC

## 2023-06-01 MED ORDER — SODIUM CHLORIDE 0.9% FLUSH
10.0000 mL | Freq: Once | INTRAVENOUS | Status: AC
Start: 2023-06-01 — End: 2023-06-01
  Administered 2023-06-01: 10 mL

## 2023-06-01 MED ORDER — HEPARIN SOD (PORK) LOCK FLUSH 100 UNIT/ML IV SOLN
500.0000 [IU] | Freq: Once | INTRAVENOUS | Status: AC | PRN
  Administered 2023-06-01: 500 [IU]

## 2023-06-01 MED ORDER — SODIUM CHLORIDE 0.9 % IV SOLN
400.0000 mg | Freq: Once | INTRAVENOUS | Status: AC
  Administered 2023-06-01: 400 mg via INTRAVENOUS
  Filled 2023-06-01: qty 16

## 2023-06-01 MED ORDER — SODIUM CHLORIDE 0.9 % IV SOLN: Freq: Once | INTRAVENOUS | Status: AC

## 2023-06-01 MED ORDER — LIDOCAINE-PRILOCAINE 2.5-2.5 % EX CREA: 1.0000 | TOPICAL_CREAM | CUTANEOUS | 3 refills | Status: AC | PRN

## 2023-06-01 NOTE — Patient Instructions (Signed)
[facility] - A DEPT OF [facility]  Discharge Instructions: Thank you for choosing [facility] to provide your oncology and hematology care.   If you have a lab appointment with the Cancer Center, please go directly to the Cancer Center and check in at the registration area.   Wear comfortable clothing and clothing appropriate for easy access to any Portacath or PICC line.   We strive to give you quality time with your provider. You may need to reschedule your appointment if you arrive late (15 or more minutes).  Arriving late affects you and other patients whose appointments are after yours.  Also, if you miss three or more appointments without notifying the office, you may be dismissed from the clinic at the provider's discretion.      For prescription refill requests, have your pharmacy contact our office and allow 72 hours for refills to be completed.    Today you received the following chemotherapy and/or immunotherapy agents Rande Lawman      To help prevent nausea and vomiting after your treatment, we encourage you to take your nausea medication as directed.  BELOW ARE SYMPTOMS THAT SHOULD BE REPORTED IMMEDIATELY: *FEVER GREATER THAN 100.4 F (38 C) OR HIGHER *CHILLS OR SWEATING *NAUSEA AND VOMITING THAT IS NOT CONTROLLED WITH YOUR NAUSEA MEDICATION *UNUSUAL SHORTNESS OF BREATH *UNUSUAL BRUISING OR BLEEDING *URINARY PROBLEMS (pain or burning when urinating, or frequent urination) *BOWEL PROBLEMS (unusual diarrhea, constipation, pain near the anus) TENDERNESS IN MOUTH AND THROAT WITH OR WITHOUT PRESENCE OF ULCERS (sore throat, sores in mouth, or a toothache) UNUSUAL RASH, SWELLING OR PAIN  UNUSUAL VAGINAL DISCHARGE OR ITCHING   Items with * indicate a potential emergency and should be followed up as soon as possible or go to the Emergency Department if any problems should occur.  Please show the CHEMOTHERAPY ALERT CARD or IMMUNOTHERAPY  ALERT CARD at check-in to the Emergency Department and triage nurse.  Should you have questions after your visit or need to cancel or reschedule your appointment, please contact [facility] - A DEPT OF [facility]  Dept: [phone]  and follow the prompts.  Office hours are 8:00 a.m. to 4:30 p.m. Monday - Friday. Please note that voicemails left after 4:00 p.m. may not be returned until the following business day.  We are closed weekends and major holidays. You have access to a nurse at all times for urgent questions. Please call the main number to the clinic Dept: [phone] and follow the prompts.   For any non-urgent questions, you may also contact your provider using MyChart. We now offer e-Visits for anyone 41 and older to request care online for non-urgent symptoms. For details visit [email or web address].   Also download the MyChart app! Go to the app store, search "MyChart", open the app, select [place], and log in with your MyChart username and password.

## 2023-06-01 NOTE — Progress Notes (Signed)
[facility] OFFICE PROGRESS NOTE  Patient Care Team: [name], MD as PCP - General (Family Medicine)  ASSESSMENT & PLAN:  Uterine cancer [facility]) I have reviewed multiple imaging studies with the patient She has no definitive signs of cancer recurrence The area of abnormal lymph node near her right inguinal region is hard to see due to artifact from her left hip but the size of the lymph node is still greater than 10 mm Plan to repeat imaging study again in May She will see her GYN surgeon in March In the meantime, we will continue immunotherapy maintenance treatment  Mild anemia This is likely due to recent treatment. The patient denies recent history of bleeding such as epistaxis, hematuria or hematochezia. She is asymptomatic from the anemia. I will observe for now.  She does not require transfusion now. I will continue the chemotherapy at current dose without dosage adjustment.  If the anemia gets progressive worse in the future, I might have to delay her treatment or adjust the chemotherapy dose.   Acute deep vein thrombosis (DVT) of right lower extremity (HCC) She has completed more than 6 months of anticoagulation therapy We discussed risk and benefits of continuing apixaban for secondary prevention versus switching over to aspirin Once she has completed her current prescription of apixaban, she is interested to switch over to aspirin only  Abdominal pain The cause of her abdominal pain is unknown It is resolving and improved since discontinuation of chemotherapy I recommend she continues taking omeprazole If her pain persist by her next visit, I will refer her to GI for further evaluation  No orders of the defined types were placed in this encounter.   All questions were answered. The patient knows to call the clinic with any problems, questions or concerns. The total time spent in the appointment was 40 minutes encounter with patients including review of  chart and various tests results, discussions about plan of care and coordination of care plan   Artis Delay, MD 06/01/2023 4:19 PM  INTERVAL HISTORY: Please see below for problem oriented charting. she returns for treatment follow-up and review of CT imaging results Since the last time saw her, she had 1 episode of intermittent severe abdominal pain and nausea but that has since resolved Overall, she felt improved since stopping treatment We spent a lot of time reviewing multiple imaging study and discussed the risk and benefits of discontinuation of anticoagulation therapy  REVIEW OF SYSTEMS:   Constitutional: Denies fevers, chills or abnormal weight loss Eyes: Denies blurriness of vision Ears, nose, mouth, throat, and face: Denies mucositis or sore throat Respiratory: Denies cough, dyspnea or wheezes Cardiovascular: Denies palpitation, chest discomfort or lower extremity swelling Gastrointestinal:  Denies nausea, heartburn or change in bowel habits Skin: Denies abnormal skin rashes Lymphatics: Denies new lymphadenopathy or easy bruising Neurological:Denies numbness, tingling or new weaknesses Behavioral/Psych: Mood is stable, no new changes  All other systems were reviewed with the patient and are negative.  I have reviewed the past medical history, past surgical history, social history and family history with the patient and they are unchanged from previous note.  ALLERGIES:  is allergic to sulfa antibiotics.  MEDICATIONS:  Current Outpatient Medications  Medication Sig Dispense Refill   acetaminophen (TYLENOL) 500 MG tablet Take 1,000 mg by mouth every 6 (six) hours as needed for moderate pain.     calcium carbonate (TUMS) 500 MG chewable tablet Chew 1 tablet by mouth daily.     Carboxymethylcellulose  Sodium (THERATEARS OP) Place 1 drop into both eyes 3 (three) times daily.     Cholecalciferol (VITAMIN D) 50 MCG (2000 UT) tablet Take 2,000 Units by mouth daily.     diclofenac  sodium (VOLTAREN) 1 % GEL Apply 2-4 g topically 4 (four) times daily. (Patient taking differently: Apply 2-4 g topically 4 (four) times daily as needed (pain).) 3 Tube 3   ELIQUIS 5 MG TABS tablet Take 5 mg by mouth 2 (two) times daily.     gabapentin (NEURONTIN) 100 MG capsule Take 1 capsule (100 mg total) by mouth at bedtime. 30 capsule 1   Glucosamine 500 MG CAPS Take 500 mg by mouth 2 (two) times daily.     levothyroxine (SYNTHROID) 100 MCG tablet Take 1 tablet (100 mcg total) by mouth daily. 30 tablet 3   lidocaine-prilocaine (EMLA) cream Apply 1 Application topically as needed. 30 g 3   loratadine (CLARITIN) 10 MG tablet Take 10 mg by mouth See admin instructions. Take 10 mg daily for 5 days starting on the day of chemo. May take 10 mg daily as needed for allergies     melatonin 5 MG TABS Take 5 mg by mouth at bedtime as needed (sleep).     Multiple Vitamin (MULTIVITAMIN) tablet Take 1 tablet by mouth daily.     omeprazole (PRILOSEC) 40 MG capsule Take 1 capsule (40 mg total) by mouth daily. 30 capsule 3   ondansetron (ZOFRAN) 8 MG tablet Take 1 tablet (8 mg total) by mouth every 8 (eight) hours as needed for nausea. 30 tablet 3   prochlorperazine (COMPAZINE) 10 MG tablet Take 1 tablet (10 mg total) by mouth every 6 (six) hours as needed for nausea or vomiting. 30 tablet 0   No current facility-administered medications for this visit.   Facility-Administered Medications Ordered in Other Visits  Medication Dose Route Frequency Provider Last Rate Last Admin   sodium chloride flush (NS) 0.9 % injection 10 mL  10 mL Intracatheter PRN Bertis Ruddy, Janel Beane, MD   10 mL at 06/01/23 1532    SUMMARY OF ONCOLOGIC HISTORY: Oncology History Overview Note  Er neg, MSI stable, PD-L1 80% No genetic testing done, did not meet criteria   Uterine cancer (HCC)  10/23/2022 Imaging   1. 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 2. Multiple prominent palpable lymph nodes in  the right groin measuring up to 2.1 cm, nonspecific. 3. Small right popliteal fossa Baker's cyst.   10/25/2022 Imaging   Ct imaging 1. Pelvic lymphadenopathy with the most prominent lymph node along the right iliac chain measuring 5.6 x 2.8 cm. Metastatic lymphadenopathy or lymphoproliferative disease cannot be excluded.  2. 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 3. Prominent right inguinal lymph nodes. 4. 8 mm sclerotic bone lesion within the right ischium, indeterminate. 5. Aortic atherosclerosis.   Aortic Atherosclerosis (ICD10-I70.0).       11/11/2022 Pathology Results   SURGICAL PATHOLOGY CASE: [id number] PATIENT: Zacaria Newcom Surgical Pathology Report  Specimen Submitted: A. Lymph node, right pelvic  Clinical History: Pelvic lymphadenopathy  DIAGNOSIS: A. LYMPH NODE, RIGHT PELVIC; CT-GUIDED CORE NEEDLE BIOPSY: - METASTATIC POORLY DIFFERENTIATED CARCINOMA, SEE COMMENT.  Comment: Immunohistochemical studies show tumor cells to be positive for CK7, MOC-31, GATA-3, CK5/6, and calretinin (subset). P16 is strongly and diffusely positive. P53 demonstrates an absence of staining within  tumor cells, indicative of a null staining pattern. PAX-8, WT-1, D240, TTF-1, cdx-2, and p40 are negative. The pattern of immunohistochemical staining is non-specific. Based on the morphologic features and pattern of immunohistochemical staining the differential diagnosis includes metastatic urothelial carcinoma, metastatic breast carcinoma, and metastatic serous carcinoma of gyn origin. Correlation with radiographic findings is required.  There is sufficient tissue present for ancillary molecular testing.  IHC slides were prepared by [facility], [place]. All controls stained  appropriately.   11/16/2022 PET scan   1. Examination is positive for tracer avid adenopathy within the retroperitoneum, bilateral pelvis and right inguinal region. Findings are compatible with nodal metastasis. Primary neoplasm is not apparent on the current exam.  2. No signs of solid organ metastasis within the abdomen or pelvis. 3. No signs of tracer avid disease above the diaphragm. 4. Asymmetric subcutaneous edema is identified involving the visualized portions of the right lower extremity. 5. 5 mm perifissural nodule in the left mid lung is too small to characterize by PET-CT. 6. Increased uptake within both lobes of thyroid gland, right greater than left. Correlation with thyroid function tests advised. 7.  Aortic Atherosclerosis (ICD10-I70.0).   11/19/2022 Initial Diagnosis   Gynecologic malignancy (HCC)   11/20/2022 Cancer Staging   Staging form: Corpus Uteri - Carcinoma and Carcinosarcoma, AJCC 8th Edition - Clinical stage from 11/20/2022: FIGO Stage IVB (cTX, cN2a, pM1) - Signed by Artis Delay, MD on 11/20/2022 Stage prefix: Initial diagnosis   12/02/2022 Procedure   Ultrasound and fluoroscopically guided right internal jugular single lumen power port catheter insertion. Tip in the SVC/RA junction. Catheter ready for use.   12/04/2022 - 12/04/2022 Chemotherapy   Patient is on Treatment Plan : UTERINE Carboplatin AUC 6 + Paclitaxel q21d     12/04/2022 -  Chemotherapy   Patient is on Treatment Plan : UTERINE Pembrolizumab (200), Paclitaxel (175), Carboplatin (5) q21d x 6 cycles / Pembrolizumab (400) q42d     12/15/2022 Imaging   1. No adnexal mass. No uterine mass lesion evident and there is no substantial thickening of the endometrium by MRI. Low signal intensity fibrous stroma of the cervix appears preserved although high signal intensity of the cervical mucosa is somewhat prominent. This could be correlated with Pap smear as clinically warranted. 2. Stable right pelvic sidewall and  groin lymphadenopathy. 3. Small Bartholin's cysts bilaterally.   02/05/2023 Imaging   CT CHEST ABDOMEN PELVIS W CONTRAST  Result Date: 02/05/2023 CLINICAL DATA:  History of endometrial cancer, monitor. High-risk. * Tracking Code: BO * EXAM: CT CHEST, ABDOMEN, AND PELVIS WITH CONTRAST TECHNIQUE: Multidetector CT imaging of the chest, abdomen and pelvis was performed following the standard protocol during bolus administration of intravenous contrast. RADIATION DOSE REDUCTION: This exam was performed according to the departmental dose-optimization program which includes automated exposure control, adjustment of the mA and/or kV according to patient size and/or use of iterative reconstruction technique. CONTRAST:  OMNIPAQUE IOHEXOL 300 MG/ML  SOLN COMPARISON:  Multiple priors including MRI pelvis December 15, 2022 PET-CT Nov 16, 2022 and CT abdomen pelvis October 25, 2022. FINDINGS: CT CHEST FINDINGS Cardiovascular: Right chest wall Port-A-Cath with the tip in the right atrium. Aortic atherosclerosis. Normal caliber thoracic aorta. No central pulmonary embolus on this nondedicated study. Normal size heart. No significant pericardial effusion/thickening Mediastinum/Nodes: No suspicious thyroid nodule. No pathologically enlarged mediastinal, hilar or axillary lymph nodes the esophagus is grossly unremarkable. Lungs/Pleura: Stable scattered tiny pulmonary nodules. For instance: -2 mm right  upper lobe pulmonary nodule on image 29/3, unchanged -2 mm pulmonary nodule along the right major fissure on image 73/3, unchanged -4 mm pulmonary nodule along the left major fissure on image 73/3, unchanged. No pleural effusion.  No pneumothorax. Musculoskeletal: No suspicious chest wall lesion. No aggressive lytic or blastic lesion of bone. CT ABDOMEN PELVIS FINDINGS Hepatobiliary: Diffuse hepatic steatosis. Gallbladder is unremarkable. No biliary ductal dilation Pancreas: No pancreatic ductal dilation or evidence of acute  inflammation Spleen: No splenomegaly. Adrenals/Urinary Tract: Bilateral adrenal glands appear normal. No hydronephrosis. Kidneys demonstrate symmetric enhancement. No suspicious renal mass. Urinary bladder is not well evaluated due to underdistention and streak artifact from left hip arthroplasty. Stomach/Bowel: No radiopaque enteric contrast material was administered. Hyperdensity along the posterior aspect of the stomach on image 53/2 appears similar on delayed imaging and is compatible with ingested material. No pathologic dilation of large or small bowel. No evidence of acute bowel inflammation. Vascular/Lymphatic: Normal caliber abdominal aorta. Aortic atherosclerosis. The portal, splenic and superior mesenteric veins are patent. Decreased size of the retroperitoneal, bilateral pelvic sidewall/iliac side chain and right inguinal lymph nodes. For reference: -Retrocaval lymph node measures 6 mm in short axis on image 69/2 previously 11 mm -Right sidewall lymph node measures 1.5 cm in short axis on image 110/2 previously 2.9 cm Reproductive: Evaluation of the pelvic reproductive structures is limited by streak artifact from left hip arthroplasty. Other: No significant abdominopelvic free fluid. No discrete peritoneal or omental nodularity. Musculoskeletal: No aggressive lytic or blastic lesion of bone. Multilevel degenerative changes spine. Left total hip arthroplasty. Degenerative change of the right hip. IMPRESSION: 1. Decreased size of the retroperitoneal, bilateral pelvic sidewall/iliac side chain and right inguinal lymph nodes, compatible with treatment response. 2. Stable scattered tiny pulmonary nodules, nonspecific but favored benign. Suggest continued attention on follow-up imaging 3. No evidence of new or progressive metastatic disease within the chest, abdomen or pelvis. 4. Diffuse hepatic steatosis. 5.  Aortic Atherosclerosis (ICD10-I70.0). Electronically Signed   By: Maudry Mayhew M.D.   On:  02/05/2023 15:03   XR Wrist Complete Right  Result Date: 01/13/2023 Right wrist  3 views: Right wrist distal radius fracture remains unchanged in overall position alignment.  Fracture is healed.  No acute findings otherwise.     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 UTERUS, CERVIX, BILATERAL FALLOPIAN TUBES, AND OVARIES, RESECTION: Cervix     Nabothian cysts     Negative for dysplasia or malignancy Endometrium     Atrophic     Negative for atypia, hyperplasia or malignancy Myometrium     Unremarkable with focal vascular calcifications     Negative for malignancy Right ovary     Focal paraovarian, inactive endometriosis     Negative for malignancy Left ovary     Unremarkable     Negative for endometriosis or malignancy Right fallopian tube     Not identified in entirely submitted right adnexa Left fallopian tube     Not identified in entirely submitted left adnexa  B. RIGHT PELVIC LYMPH NODE, EXCISION: Lymph node with hyalin fibrosis, focal necrosis and dystrophic calcifications Focal perinodal fat necrosis Negative for residual, viable carcinoma See comment  C. SEGMENT OF OBTURATOR NERVE, EXCISION: Benign nerve Negative for malignancy  D. OMENTUM: Benign adipose tissue Negative for malignancy  COMMENT: B. The right pelvic lymph node is entirely submitted  and shows large areas of hyalin fibrosis focally associated with necrosis and dystrophic calcifications.  The findings are consistent with tumor regression although no residual, viable carcinoma is identified.     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 No palpable right lymphadenopathy. On entry to abdomen, normal upper abdominal survey with smooth diaphragm, liver, stomach and normal appearing omentum and bowel. Nodule in the gallbladder fossa (image in media). In the pelvis, filmy adhesions of the sigmoid colon to the left pelvic sidewall. Otherwise small, normal appearing uterus and bilateral fallopian tubes and ovaries. Right pelvic sidewall with enlarged lymph node, approximately 4x2cm, densely adherent to the pelvic sidewall, external iliac vein. Densely adherent and encasing the obturator nerve and artery. With careful dissection, unavoidable avulsion injury occurred to the obturator artery. Due to the densely adherent nature of the lymph node, the nerve and artery were initially inseparable. In obtaining control of the bleeding from the obturator artery, unavoidable crush and cautery injury to the obturator nerve. Following removal of the involved node, the affected portion of the obturator nerve was resected and well reapproximated. No other gross evidence of disease in the abdomen or pelvis.    05/21/2023 Imaging   CT CHEST ABDOMEN PELVIS W CONTRAST  Result Date: 05/21/2023 CLINICAL DATA:  History of endometrial cancer, monitor. * Tracking Code: BO *. EXAM: CT CHEST, ABDOMEN, AND PELVIS WITH CONTRAST TECHNIQUE: Multidetector CT imaging of the chest, abdomen and pelvis was performed following the standard protocol during bolus administration of intravenous contrast. RADIATION DOSE REDUCTION: This exam was performed according to the departmental dose-optimization program which includes automated exposure control, adjustment of the mA and/or kV  according to patient size and/or use of iterative reconstruction technique. CONTRAST:  OMNIPAQUE IOHEXOL 300 MG/ML  SOLN COMPARISON:  Multiple priors including most recent CT February 05, 2023. FINDINGS: CT CHEST FINDINGS Cardiovascular: Accessed right chest Port-A-Cath with tip in the right atrium. Aortic atherosclerosis. No central pulmonary embolus on this nondedicated study. Normal size heart. No significant pericardial effusion/thickening. Mediastinum/Nodes: No suspicious thyroid nodule. No pathologically enlarged mediastinal, hilar or axillary lymph nodes. The esophagus is grossly unremarkable. Lungs/Pleura: Stable scattered tiny pulmonary nodules. No new suspicious pulmonary nodules or masses. For reference: -right upper lobe pulmonary nodule measuring 2 mm on image 27/302 -pulmonary nodule along the right major fissure measuring 2 mm on image 66/302 -pulmonary nodule along the left major fissure measuring 4 mm on image 63/302. Musculoskeletal: No aggressive lytic or blastic lesion of bone. CT ABDOMEN PELVIS FINDINGS Hepatobiliary: No suspicious hepatic lesion. Gallbladder is unremarkable. No biliary ductal dilation. Pancreas: No pancreatic ductal dilation or evidence of acute inflammation. Spleen: No splenomegaly. Adrenals/Urinary Tract: Bilateral adrenal glands appear normal. No hydronephrosis. Kidneys demonstrate symmetric enhancement. Urinary bladder not well evaluated due to streak artifact from left hip arthroplasty. Stomach/Bowel: No radiopaque enteric contrast material administered. Stomach is unremarkable for degree of distension. No pathologic dilation of small or large bowel. No evidence of acute bowel inflammation. Vascular/Lymphatic: Normal caliber abdominal aorta. Aortic atherosclerosis. The portal, splenic and superior mesenteric veins are patent. Decreased size of the retroperitoneal, pelvic and inguinal lymph  nodes although evaluation is limited by streak artifact from left hip  arthroplasty. For reference: -retrocaval lymph node measures 6 mm on image 70/301 previously 11 mm -right pelvic sidewall lymph node measures 13 mm in short axis on image 103/301 previously 15 mm. -right inguinal lymph node measures 7 mm in short axis on image 121/301 previously 9 mm Reproductive: Evaluation of the pelvic structures is limited by streak artifact from left hip arthroplasty. Other: No significant abdominopelvic free fluid. No discrete peritoneal or omental nodularity. Musculoskeletal: No aggressive lytic or blastic lesion of bone. Left total hip arthroplasty. Similar sclerotic foci in the left iliac bone and right acetabulum favored to reflect bone islands. IMPRESSION: 1. Decreased size of the retroperitoneal pelvic and inguinal lymph nodes. 2. Stable scattered tiny pulmonary nodules, favored benign. 3. No evidence of new or progressive disease in the chest, abdomen or pelvis. 4.  Aortic Atherosclerosis (ICD10-I70.0). Electronically Signed   By: Maudry Mayhew M.D.   On: 05/21/2023 16:26        PHYSICAL EXAMINATION: ECOG PERFORMANCE STATUS: 1 - Symptomatic but completely ambulatory  Vitals:   06/01/23 1324  BP: 112/79  Pulse: 82  Resp: 18  Temp: 99.3 F (37.4 C)  SpO2: 100%   Filed Weights   06/01/23 1324  Weight: 145 lb 9.6 oz (66 kg)    GENERAL:alert, no distress and comfortable  LABORATORY DATA:  I have reviewed the data as listed    Component Value Date/Time   NA 140 06/01/2023 1301   K 3.8 06/01/2023 1301   CL 104 06/01/2023 1301   CO2 30 06/01/2023 1301   GLUCOSE 86 06/01/2023 1301   BUN 18 06/01/2023 1301   CREATININE 0.76 06/01/2023 1301   CREATININE 0.83 12/14/2016 1247   CALCIUM 9.1 06/01/2023 1301   PROT 6.3 (L) 06/01/2023 1301   ALBUMIN 4.0 06/01/2023 1301   AST 17 06/01/2023 1301   ALT 9 06/01/2023 1301   ALKPHOS 72 06/01/2023 1301   BILITOT 0.4 06/01/2023 1301   GFRNONAA >60 06/01/2023 1301   GFRNONAA 81 11/28/2015 1516   GFRAA >60 12/21/2015  0432   GFRAA >89 11/28/2015 1516    No results found for: "SPEP", "UPEP"  Lab Results  Component Value Date   WBC 4.0 06/01/2023   NEUTROABS 2.4 06/01/2023   HGB 10.4 (L) 06/01/2023   HCT 30.7 (L) 06/01/2023   MCV 94.2 06/01/2023   PLT 167 06/01/2023      Chemistry      Component Value Date/Time   NA 140 06/01/2023 1301   K 3.8 06/01/2023 1301   CL 104 06/01/2023 1301   CO2 30 06/01/2023 1301   BUN 18 06/01/2023 1301   CREATININE 0.76 06/01/2023 1301   CREATININE 0.83 12/14/2016 1247      Component Value Date/Time   CALCIUM 9.1 06/01/2023 1301   ALKPHOS 72 06/01/2023 1301   AST 17 06/01/2023 1301   ALT 9 06/01/2023 1301   BILITOT 0.4 06/01/2023 1301       RADIOGRAPHIC STUDIES: I have reviewed multiple imaging studies with her extensively I have personally reviewed the radiological images as listed and agreed with the findings in the report. CT CHEST ABDOMEN PELVIS W CONTRAST  Result Date: 05/21/2023 CLINICAL DATA:  History of endometrial cancer, monitor. * Tracking Code: BO *. EXAM: CT CHEST, ABDOMEN, AND PELVIS WITH CONTRAST TECHNIQUE: Multidetector CT imaging of the chest, abdomen and pelvis was performed following the standard protocol during bolus administration of intravenous contrast. RADIATION DOSE REDUCTION:  This exam was performed according to the departmental dose-optimization program which includes automated exposure control, adjustment of the mA and/or kV according to patient size and/or use of iterative reconstruction technique. CONTRAST:  OMNIPAQUE IOHEXOL 300 MG/ML  SOLN COMPARISON:  Multiple priors including most recent CT February 05, 2023. FINDINGS: CT CHEST FINDINGS Cardiovascular: Accessed right chest Port-A-Cath with tip in the right atrium. Aortic atherosclerosis. No central pulmonary embolus on this nondedicated study. Normal size heart. No significant pericardial effusion/thickening. Mediastinum/Nodes: No suspicious thyroid nodule. No  pathologically enlarged mediastinal, hilar or axillary lymph nodes. The esophagus is grossly unremarkable. Lungs/Pleura: Stable scattered tiny pulmonary nodules. No new suspicious pulmonary nodules or masses. For reference: -right upper lobe pulmonary nodule measuring 2 mm on image 27/302 -pulmonary nodule along the right major fissure measuring 2 mm on image 66/302 -pulmonary nodule along the left major fissure measuring 4 mm on image 63/302. Musculoskeletal: No aggressive lytic or blastic lesion of bone. CT ABDOMEN PELVIS FINDINGS Hepatobiliary: No suspicious hepatic lesion. Gallbladder is unremarkable. No biliary ductal dilation. Pancreas: No pancreatic ductal dilation or evidence of acute inflammation. Spleen: No splenomegaly. Adrenals/Urinary Tract: Bilateral adrenal glands appear normal. No hydronephrosis. Kidneys demonstrate symmetric enhancement. Urinary bladder not well evaluated due to streak artifact from left hip arthroplasty. Stomach/Bowel: No radiopaque enteric contrast material administered. Stomach is unremarkable for degree of distension. No pathologic dilation of small or large bowel. No evidence of acute bowel inflammation. Vascular/Lymphatic: Normal caliber abdominal aorta. Aortic atherosclerosis. The portal, splenic and superior mesenteric veins are patent. Decreased size of the retroperitoneal, pelvic and inguinal lymph nodes although evaluation is limited by streak artifact from left hip arthroplasty. For reference: -retrocaval lymph node measures 6 mm on image 70/301 previously 11 mm -right pelvic sidewall lymph node measures 13 mm in short axis on image 103/301 previously 15 mm. -right inguinal lymph node measures 7 mm in short axis on image 121/301 previously 9 mm Reproductive: Evaluation of the pelvic structures is limited by streak artifact from left hip arthroplasty. Other: No significant abdominopelvic free fluid. No discrete peritoneal or omental nodularity. Musculoskeletal: No  aggressive lytic or blastic lesion of bone. Left total hip arthroplasty. Similar sclerotic foci in the left iliac bone and right acetabulum favored to reflect bone islands. IMPRESSION: 1. Decreased size of the retroperitoneal pelvic and inguinal lymph nodes. 2. Stable scattered tiny pulmonary nodules, favored benign. 3. No evidence of new or progressive disease in the chest, abdomen or pelvis. 4.  Aortic Atherosclerosis (ICD10-I70.0). Electronically Signed   By: Maudry Mayhew M.D.   On: 05/21/2023 16:26

## 2023-06-01 NOTE — Assessment & Plan Note (Signed)
She has completed more than 6 months of anticoagulation therapy We discussed risk and benefits of continuing apixaban for secondary prevention versus switching over to aspirin Once she has completed her current prescription of apixaban, she is interested to switch over to aspirin only

## 2023-06-01 NOTE — Assessment & Plan Note (Signed)
The cause of her abdominal pain is unknown It is resolving and improved since discontinuation of chemotherapy I recommend she continues taking omeprazole If her pain persist by her next visit, I will refer her to GI for further evaluation

## 2023-06-01 NOTE — Assessment & Plan Note (Signed)
This is likely due to recent treatment. The patient denies recent history of bleeding such as epistaxis, hematuria or hematochezia. She is asymptomatic from the anemia. I will observe for now.  She does not require transfusion now. I will continue the chemotherapy at current dose without dosage adjustment.  If the anemia gets progressive worse in the future, I might have to delay her treatment or adjust the chemotherapy dose.  

## 2023-06-01 NOTE — Assessment & Plan Note (Signed)
I have reviewed multiple imaging studies with the patient [name] has no definitive signs of cancer recurrence The area of abnormal lymph node near her right inguinal region is hard to see due to artifact from her left hip but the size of the lymph node is still greater than 10 mm Plan to repeat imaging study again in May [name] will see her GYN surgeon in March In the meantime, we will continue immunotherapy maintenance treatment

## 2023-06-02 ENCOUNTER — Telehealth: Payer: Self-pay

## 2023-06-02 ENCOUNTER — Other Ambulatory Visit: Payer: Self-pay | Admitting: Hematology and Oncology

## 2023-06-02 ENCOUNTER — Telehealth: Payer: Self-pay | Admitting: Oncology

## 2023-06-02 LAB — T4: T4, Total: 8.1 ug/dL (ref 4.5–12.0)

## 2023-06-02 MED ORDER — LEVOTHYROXINE SODIUM 100 MCG PO TABS: 100.0000 ug | ORAL_TABLET | Freq: Every day | ORAL | 3 refills | Status: DC

## 2023-06-02 NOTE — Telephone Encounter (Signed)
-----   Message from Artis Delay sent at 06/02/2023  9:08 AM EST ----- TSH is better I refilled her synthroid same dose, let her know

## 2023-06-02 NOTE — Telephone Encounter (Signed)
LVM informing patient of recent lab results and synthroid refill.  Left calback number should patient have any questions or concerns.

## 2023-06-02 NOTE — Telephone Encounter (Signed)
Called [place] and scheduled a follow up appointment with Dr. Alvester Morin on 08/30/23 at 3:30.

## 2023-07-13 ENCOUNTER — Inpatient Hospital Stay: Payer: Medicare PPO | Admitting: Hematology and Oncology

## 2023-07-13 ENCOUNTER — Encounter: Payer: Self-pay | Admitting: Hematology and Oncology

## 2023-07-13 ENCOUNTER — Inpatient Hospital Stay: Payer: Medicare PPO

## 2023-07-13 ENCOUNTER — Inpatient Hospital Stay: Payer: Medicare PPO | Attending: Gynecologic Oncology

## 2023-07-13 VITALS — BP 114/76 | HR 69 | Temp 98.5°F | Resp 18 | Ht 62.0 in | Wt 141.2 lb

## 2023-07-13 DIAGNOSIS — I82411 Acute embolism and thrombosis of right femoral vein: Secondary | ICD-10-CM | POA: Diagnosis not present

## 2023-07-13 DIAGNOSIS — Z8542 Personal history of malignant neoplasm of other parts of uterus: Secondary | ICD-10-CM | POA: Insufficient documentation

## 2023-07-13 DIAGNOSIS — Z86718 Personal history of other venous thrombosis and embolism: Secondary | ICD-10-CM | POA: Diagnosis not present

## 2023-07-13 DIAGNOSIS — Z79899 Other long term (current) drug therapy: Secondary | ICD-10-CM | POA: Diagnosis not present

## 2023-07-13 DIAGNOSIS — D649 Anemia, unspecified: Secondary | ICD-10-CM | POA: Diagnosis not present

## 2023-07-13 DIAGNOSIS — C55 Malignant neoplasm of uterus, part unspecified: Secondary | ICD-10-CM | POA: Diagnosis not present

## 2023-07-13 DIAGNOSIS — Z7982 Long term (current) use of aspirin: Secondary | ICD-10-CM | POA: Diagnosis not present

## 2023-07-13 LAB — CBC WITH DIFFERENTIAL (CANCER CENTER ONLY)
Abs Immature Granulocytes: 0.02 10*3/uL (ref 0.00–0.07)
Basophils Absolute: 0 10*3/uL (ref 0.0–0.1)
Basophils Relative: 1 %
Eosinophils Absolute: 0.1 10*3/uL (ref 0.0–0.5)
Eosinophils Relative: 2 %
HCT: 31.9 % — ABNORMAL LOW (ref 36.0–46.0)
Hemoglobin: 10.9 g/dL — ABNORMAL LOW (ref 12.0–15.0)
Immature Granulocytes: 0 %
Lymphocytes Relative: 20 %
Lymphs Abs: 1.2 10*3/uL (ref 0.7–4.0)
MCH: 31.6 pg (ref 26.0–34.0)
MCHC: 34.2 g/dL (ref 30.0–36.0)
MCV: 92.5 fL (ref 80.0–100.0)
Monocytes Absolute: 0.4 10*3/uL (ref 0.1–1.0)
Monocytes Relative: 7 %
Neutro Abs: 4.3 10*3/uL (ref 1.7–7.7)
Neutrophils Relative %: 70 %
Platelet Count: 222 10*3/uL (ref 150–400)
RBC: 3.45 MIL/uL — ABNORMAL LOW (ref 3.87–5.11)
RDW: 12.2 % (ref 11.5–15.5)
WBC Count: 6.1 10*3/uL (ref 4.0–10.5)
nRBC: 0 % (ref 0.0–0.2)

## 2023-07-13 LAB — CMP (CANCER CENTER ONLY)
ALT: 9 U/L (ref 0–44)
AST: 17 U/L (ref 15–41)
Albumin: 4.1 g/dL (ref 3.5–5.0)
Alkaline Phosphatase: 69 U/L (ref 38–126)
Anion gap: 4 — ABNORMAL LOW (ref 5–15)
BUN: 13 mg/dL (ref 8–23)
CO2: 32 mmol/L (ref 22–32)
Calcium: 9.4 mg/dL (ref 8.9–10.3)
Chloride: 102 mmol/L (ref 98–111)
Creatinine: 0.75 mg/dL (ref 0.44–1.00)
GFR, Estimated: >60 mL/min (ref >60)
Glucose, Bld: 99 mg/dL (ref 70–99)
Potassium: 3.8 mmol/L (ref 3.5–5.1)
Sodium: 138 mmol/L (ref 135–145)
Total Bilirubin: 0.6 mg/dL (ref 0.0–1.2)
Total Protein: 6.6 g/dL (ref 6.5–8.1)

## 2023-07-13 LAB — TSH: TSH: 0.951 u[IU]/mL (ref 0.350–4.500)

## 2023-07-13 MED ORDER — SODIUM CHLORIDE 0.9% FLUSH
10.0000 mL | Freq: Once | INTRAVENOUS | Status: AC
  Administered 2023-07-13: 10 mL

## 2023-07-13 MED ORDER — SODIUM CHLORIDE 0.9% FLUSH
10.0000 mL | INTRAVENOUS | Status: DC | PRN
  Administered 2023-07-13: 10 mL

## 2023-07-13 MED ORDER — PEMBROLIZUMAB CHEMO INJECTION 100 MG/4ML
400.0000 mg | Freq: Once | INTRAVENOUS | Status: AC
  Administered 2023-07-13: 400 mg via INTRAVENOUS
  Filled 2023-07-13: qty 16

## 2023-07-13 MED ORDER — SODIUM CHLORIDE 0.9 % IV SOLN: Freq: Once | INTRAVENOUS | Status: AC

## 2023-07-13 MED ORDER — HEPARIN SOD (PORK) LOCK FLUSH 100 UNIT/ML IV SOLN
500.0000 [IU] | Freq: Once | INTRAVENOUS | Status: AC | PRN
  Administered 2023-07-13: 500 [IU]

## 2023-07-13 NOTE — Patient Instructions (Signed)

## 2023-07-13 NOTE — Assessment & Plan Note (Signed)
 Her last imaging study showed no evidence of disease Plan to repeat imaging study again in May She will see her GYN surgeon in March In the meantime, we will continue immunotherapy maintenance treatment

## 2023-07-13 NOTE — Assessment & Plan Note (Signed)
 This is likely due to recent treatment. The patient denies recent history of bleeding such as epistaxis, hematuria or hematochezia. She is asymptomatic from the anemia. I will observe for now.  She does not require transfusion now. I will continue the chemotherapy at current dose without dosage adjustment.  If the anemia gets progressive worse in the future, I might have to delay her treatment or adjust the chemotherapy dose.

## 2023-07-13 NOTE — Assessment & Plan Note (Signed)
 She has completed more than 6 months of anticoagulation therapy She was switched back to aspirin therapy when her Eliquis prescription runs out

## 2023-07-13 NOTE — Progress Notes (Signed)
 [name] OFFICE PROGRESS NOTE  Patient Care Team: Lisa Loader, MD as PCP - General (Family Medicine)  ASSESSMENT & PLAN:  Uterine cancer [facility]) Her last imaging study showed no evidence of disease Plan to repeat imaging study again in May She will see her GYN surgeon in March In the meantime, we will continue immunotherapy maintenance treatment  Mild anemia This is likely due to recent treatment. The patient denies recent history of bleeding such as epistaxis, hematuria or hematochezia. She is asymptomatic from the anemia. I will observe for now.  She does not require transfusion now. I will continue the chemotherapy at current dose without dosage adjustment.  If the anemia gets progressive worse in the future, I might have to delay her treatment or adjust the chemotherapy dose.   Acute deep vein thrombosis (DVT) of right lower extremity (HCC) She has completed more than 6 months of anticoagulation therapy She was switched back to aspirin  therapy when her Eliquis  prescription runs out  No orders of the defined types were placed in this encounter.   All questions were answered. The patient knows to call the clinic with any problems, questions or concerns. The total time spent in the appointment was 20 minutes encounter with patients including review of chart and various tests results, discussions about plan of care and coordination of care plan   Lisa Bedford, MD 07/13/2023 1:00 PM  INTERVAL HISTORY: Please see below for problem oriented charting. she returns for treatment follow-up She is doing well except for complaints of dry mouth Her abdominal pain has resolved She is still dealing with some mild neuropathy but overall stable She is no longer taking gabapentin  She is back to playing tennis She complained of fatigue  REVIEW OF SYSTEMS:   Constitutional: Denies fevers, chills or abnormal weight loss Eyes: Denies blurriness of vision Ears, nose, mouth,  throat, and face: Denies mucositis or sore throat Respiratory: Denies cough, dyspnea or wheezes Cardiovascular: Denies palpitation, chest discomfort or lower extremity swelling Gastrointestinal:  Denies nausea, heartburn or change in bowel habits Skin: Denies abnormal skin rashes Lymphatics: Denies new lymphadenopathy or easy bruising Behavioral/Psych: Mood is stable, no new changes  All other systems were reviewed with the patient and are negative.  I have reviewed the past medical history, past surgical history, social history and family history with the patient and they are unchanged from previous note.  ALLERGIES:  is allergic to sulfa antibiotics.  MEDICATIONS:  Current Outpatient Medications  Medication Sig Dispense Refill   acetaminophen  (TYLENOL ) 500 MG tablet Take 1,000 mg by mouth every 6 (six) hours as needed for moderate pain.     calcium carbonate (TUMS) 500 MG chewable tablet Chew 1 tablet by mouth daily.     Carboxymethylcellulose Sodium (THERATEARS OP) Place 1 drop into both eyes 3 (three) times daily.     Cholecalciferol (VITAMIN D ) 50 MCG (2000 UT) tablet Take 2,000 Units by mouth daily.     diclofenac  sodium (VOLTAREN ) 1 % GEL Apply 2-4 g topically 4 (four) times daily. (Patient taking differently: Apply 2-4 g topically 4 (four) times daily as needed (pain).) 3 Tube 3   Glucosamine 500 MG CAPS Take 500 mg by mouth 2 (two) times daily.     levothyroxine  (SYNTHROID ) 100 MCG tablet Take 1 tablet (100 mcg total) by mouth daily. 60 tablet 3   lidocaine -prilocaine  (EMLA ) cream Apply 1 Application topically as needed. 30 g 3   loratadine (CLARITIN) 10 MG tablet Take 10 mg by  mouth See admin instructions. Take 10 mg daily for 5 days starting on the day of chemo. May take 10 mg daily as needed for allergies     melatonin 5 MG TABS Take 5 mg by mouth at bedtime as needed (sleep).     Multiple Vitamin (MULTIVITAMIN) tablet Take 1 tablet by mouth daily.     omeprazole  (PRILOSEC) 40  MG capsule Take 1 capsule (40 mg total) by mouth daily. 30 capsule 3   ondansetron  (ZOFRAN ) 8 MG tablet Take 1 tablet (8 mg total) by mouth every 8 (eight) hours as needed for nausea. 30 tablet 3   prochlorperazine  (COMPAZINE ) 10 MG tablet Take 1 tablet (10 mg total) by mouth every 6 (six) hours as needed for nausea or vomiting. 30 tablet 0   No current facility-administered medications for this visit.    SUMMARY OF ONCOLOGIC HISTORY: Oncology History Overview Note  Er neg, MSI stable, PD-L1 80% No genetic testing done, did not meet criteria   Uterine cancer (HCC)  10/23/2022 Imaging   1. 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 2. Multiple prominent palpable lymph nodes in the right groin measuring up to 2.1 cm, nonspecific. 3. Small right popliteal fossa Baker's cyst.   10/25/2022 Imaging   Ct imaging 1. Pelvic lymphadenopathy with the most prominent lymph node along the right iliac chain measuring 5.6 x 2.8 cm. Metastatic lymphadenopathy or lymphoproliferative disease cannot be excluded.  2. 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 3. Prominent right inguinal lymph nodes. 4. 8 mm sclerotic bone lesion within the right ischium, indeterminate. 5. Aortic atherosclerosis.   Aortic Atherosclerosis (ICD10-I70.0).       11/11/2022 Pathology Results   SURGICAL PATHOLOGY CASE: [phone] PATIENT: Lisa  Zavala Surgical Pathology Report  Specimen Submitted: A. Lymph node, right pelvic  Clinical History: Pelvic lymphadenopathy  DIAGNOSIS: A. LYMPH NODE, RIGHT PELVIC; CT-GUIDED CORE NEEDLE BIOPSY: - METASTATIC POORLY DIFFERENTIATED CARCINOMA, SEE COMMENT.  Comment: Immunohistochemical studies show tumor cells to be positive for CK7, MOC-31, GATA-3, CK5/6, and calretinin  (subset). P16 is strongly and diffusely positive. P53 demonstrates an absence of staining within tumor cells, indicative of a null staining pattern. PAX-8, WT-1, D240, TTF-1, cdx-2, and p40 are negative. The pattern of immunohistochemical staining is non-specific. Based on the morphologic features and pattern of immunohistochemical staining the differential diagnosis includes metastatic urothelial carcinoma, metastatic breast carcinoma, and metastatic serous carcinoma of gyn origin. Correlation with radiographic findings is required.  There is sufficient tissue present for ancillary molecular testing.  IHC slides were prepared by [facility] for Molecular Biology and Pathology, [place], [place]. All controls stained appropriately.   11/16/2022 PET scan   1. Examination is positive for tracer avid adenopathy within the retroperitoneum, bilateral pelvis and right inguinal region. Findings are compatible with nodal metastasis. Primary neoplasm is not apparent on the current exam.  2. No signs of solid organ metastasis within the abdomen or pelvis. 3. No signs of tracer avid disease above the diaphragm. 4. Asymmetric subcutaneous edema is identified involving the visualized portions of the right lower extremity. 5. 5 mm perifissural nodule in the left mid lung is too small to characterize by PET-CT. 6. Increased uptake within both lobes of thyroid  gland, right greater than left. Correlation with thyroid  function tests advised. 7.  Aortic Atherosclerosis (ICD10-I70.0).   11/19/2022 Initial Diagnosis  Gynecologic malignancy [facility])   11/20/2022 Cancer Staging   Staging form: Corpus Uteri - Carcinoma and Carcinosarcoma, AJCC 8th Edition - Clinical stage from 11/20/2022: FIGO Stage IVB (cTX, cN2a, pM1) - Signed by Lonn Hicks, MD on 11/20/2022 Stage prefix: Initial diagnosis   12/02/2022 Procedure   Ultrasound and fluoroscopically guided right internal jugular single lumen power port catheter insertion. Tip in  the SVC/RA junction. Catheter ready for use.   12/04/2022 - 12/04/2022 Chemotherapy   Patient is on Treatment Plan : UTERINE Carboplatin  AUC 6 + Paclitaxel  q21d     12/04/2022 -  Chemotherapy   Patient is on Treatment Plan : UTERINE Pembrolizumab  (200), Paclitaxel  (175), Carboplatin  (5) q21d x 6 cycles / Pembrolizumab  (400) q42d     12/15/2022 Imaging   1. No adnexal mass. No uterine mass lesion evident and there is no substantial thickening of the endometrium by MRI. Low signal intensity fibrous stroma of the cervix appears preserved although high signal intensity of the cervical mucosa is somewhat prominent. This could be correlated with Pap smear as clinically warranted. 2. Stable right pelvic sidewall and groin lymphadenopathy. 3. Small Bartholin's cysts bilaterally.   02/05/2023 Imaging   CT CHEST ABDOMEN PELVIS W CONTRAST  Result Date: 02/05/2023 CLINICAL DATA:  History of endometrial cancer, monitor. High-risk. * Tracking Code: BO * EXAM: CT CHEST, ABDOMEN, AND PELVIS WITH CONTRAST TECHNIQUE: Multidetector CT imaging of the chest, abdomen and pelvis was performed following the standard protocol during bolus administration of intravenous contrast. RADIATION DOSE REDUCTION: This exam was performed according to the departmental dose-optimization program which includes automated exposure control, adjustment of the mA and/or kV according to patient size and/or use of iterative reconstruction technique. CONTRAST:  OMNIPAQUE  IOHEXOL  300 MG/ML  SOLN COMPARISON:  Multiple priors including MRI pelvis December 15, 2022 PET-CT Nov 16, 2022 and CT abdomen pelvis October 25, 2022. FINDINGS: CT CHEST FINDINGS Cardiovascular: Right chest wall Port-A-Cath with the tip in the right atrium. Aortic atherosclerosis. Normal caliber thoracic aorta. No central pulmonary embolus on this nondedicated study. Normal size heart. No significant pericardial effusion/thickening Mediastinum/Nodes: No suspicious thyroid  nodule. No  pathologically enlarged mediastinal, hilar or axillary lymph nodes the esophagus is grossly unremarkable. Lungs/Pleura: Stable scattered tiny pulmonary nodules. For instance: -2 mm right upper lobe pulmonary nodule on image 29/3, unchanged -2 mm pulmonary nodule along the right major fissure on image 73/3, unchanged -4 mm pulmonary nodule along the left major fissure on image 73/3, unchanged. No pleural effusion.  No pneumothorax. Musculoskeletal: No suspicious chest wall lesion. No aggressive lytic or blastic lesion of bone. CT ABDOMEN PELVIS FINDINGS Hepatobiliary: Diffuse hepatic steatosis. Gallbladder is unremarkable. No biliary ductal dilation Pancreas: No pancreatic ductal dilation or evidence of acute inflammation Spleen: No splenomegaly. Adrenals/Urinary Tract: Bilateral adrenal glands appear normal. No hydronephrosis. Kidneys demonstrate symmetric enhancement. No suspicious renal mass. Urinary bladder is not well evaluated due to underdistention and streak artifact from left hip arthroplasty. Stomach/Bowel: No radiopaque enteric contrast material was administered. Hyperdensity along the posterior aspect of the stomach on image 53/2 appears similar on delayed imaging and is compatible with ingested material. No pathologic dilation of large or small bowel. No evidence of acute bowel inflammation. Vascular/Lymphatic: Normal caliber abdominal aorta. Aortic atherosclerosis. The portal, splenic and superior mesenteric veins are patent. Decreased size of the retroperitoneal, bilateral pelvic sidewall/iliac side chain and right inguinal lymph nodes. For reference: -Retrocaval lymph node measures 6 mm in short axis on image 69/2 previously 11 mm -Right sidewall lymph node  measures 1.5 cm in short axis on image 110/2 previously 2.9 cm Reproductive: Evaluation of the pelvic reproductive structures is limited by streak artifact from left hip arthroplasty. Other: No significant abdominopelvic free fluid. No discrete  peritoneal or omental nodularity. Musculoskeletal: No aggressive lytic or blastic lesion of bone. Multilevel degenerative changes spine. Left total hip arthroplasty. Degenerative change of the right hip. IMPRESSION: 1. Decreased size of the retroperitoneal, bilateral pelvic sidewall/iliac side chain and right inguinal lymph nodes, compatible with treatment response. 2. Stable scattered tiny pulmonary nodules, nonspecific but favored benign. Suggest continued attention on follow-up imaging 3. No evidence of new or progressive metastatic disease within the chest, abdomen or pelvis. 4. Diffuse hepatic steatosis. 5.  Aortic Atherosclerosis (ICD10-I70.0). Electronically Signed   By: Reyes Holder M.D.   On: 02/05/2023 15:03   XR Wrist Complete Right  Result Date: 01/13/2023 Right wrist  3 views: Right wrist distal radius fracture remains unchanged in overall position alignment.  Fracture is healed.  No acute findings otherwise.     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 UTERUS, CERVIX, BILATERAL FALLOPIAN TUBES, AND OVARIES, RESECTION: Cervix     Nabothian cysts     Negative for dysplasia or malignancy Endometrium     Atrophic     Negative for atypia, hyperplasia or malignancy Myometrium     Unremarkable with focal vascular calcifications     Negative for malignancy Right ovary     Focal paraovarian, inactive endometriosis     Negative for malignancy Left ovary     Unremarkable     Negative for endometriosis or malignancy Right fallopian tube     Not identified in entirely submitted right adnexa Left fallopian tube     Not identified in entirely submitted left adnexa  B. RIGHT PELVIC LYMPH NODE, EXCISION: Lymph node with hyalin fibrosis, focal necrosis and dystrophic calcifications Focal  perinodal fat necrosis Negative for residual, viable carcinoma See comment  C. SEGMENT OF OBTURATOR NERVE, EXCISION: Benign nerve Negative for malignancy  D. OMENTUM: Benign adipose tissue Negative for malignancy  COMMENT: B. The right pelvic lymph node is entirely submitted and shows large areas of hyalin fibrosis focally associated with necrosis and dystrophic calcifications.  The findings are consistent with tumor regression although no residual, viable carcinoma is identified.     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 No palpable right lymphadenopathy. On entry to abdomen, normal upper abdominal survey with smooth diaphragm, liver, stomach and normal appearing omentum and bowel. Nodule in the gallbladder fossa (image in media). In the pelvis, filmy adhesions of the sigmoid colon to the left pelvic sidewall. Otherwise small, normal appearing uterus and bilateral fallopian tubes and ovaries. Right pelvic sidewall with enlarged lymph node, approximately 4x2cm, densely adherent to the pelvic sidewall, external iliac vein. Densely adherent and encasing the obturator nerve and artery. With careful dissection, unavoidable avulsion injury occurred to the obturator artery. Due to the densely adherent nature of the lymph node, the nerve  and artery were initially inseparable. In obtaining control of the bleeding from the obturator artery, unavoidable crush and cautery injury to the obturator nerve. Following removal of the involved  node, the affected portion of the obturator nerve was resected and well reapproximated. No other gross evidence of disease in the abdomen or pelvis.    05/21/2023 Imaging   CT CHEST ABDOMEN PELVIS W CONTRAST  Result Date: 05/21/2023 CLINICAL DATA:  History of endometrial cancer, monitor. * Tracking Code: BO *. EXAM: CT CHEST, ABDOMEN, AND PELVIS WITH CONTRAST TECHNIQUE: Multidetector CT imaging of the chest, abdomen and pelvis was performed following the standard protocol during bolus administration of intravenous contrast. RADIATION DOSE REDUCTION: This exam was performed according to the departmental dose-optimization program which includes automated exposure control, adjustment of the mA and/or kV according to patient size and/or use of iterative reconstruction technique. CONTRAST:  OMNIPAQUE  IOHEXOL  300 MG/ML  SOLN COMPARISON:  Multiple priors including most recent CT February 05, 2023. FINDINGS: CT CHEST FINDINGS Cardiovascular: Accessed right chest Port-A-Cath with tip in the right atrium. Aortic atherosclerosis. No central pulmonary embolus on this nondedicated study. Normal size heart. No significant pericardial effusion/thickening. Mediastinum/Nodes: No suspicious thyroid  nodule. No pathologically enlarged mediastinal, hilar or axillary lymph nodes. The esophagus is grossly unremarkable. Lungs/Pleura: Stable scattered tiny pulmonary nodules. No new suspicious pulmonary nodules or masses. For reference: -right upper lobe pulmonary nodule measuring 2 mm on image 27/302 -pulmonary nodule along the right major fissure measuring 2 mm on image 66/302 -pulmonary nodule along the left major fissure measuring 4 mm on image 63/302. Musculoskeletal: No aggressive lytic or blastic lesion of bone. CT ABDOMEN PELVIS FINDINGS Hepatobiliary: No suspicious hepatic lesion. Gallbladder is unremarkable. No biliary ductal dilation. Pancreas: No pancreatic ductal dilation or evidence of acute inflammation. Spleen: No  splenomegaly. Adrenals/Urinary Tract: Bilateral adrenal glands appear normal. No hydronephrosis. Kidneys demonstrate symmetric enhancement. Urinary bladder not well evaluated due to streak artifact from left hip arthroplasty. Stomach/Bowel: No radiopaque enteric contrast material administered. Stomach is unremarkable for degree of distension. No pathologic dilation of small or large bowel. No evidence of acute bowel inflammation. Vascular/Lymphatic: Normal caliber abdominal aorta. Aortic atherosclerosis. The portal, splenic and superior mesenteric veins are patent. Decreased size of the retroperitoneal, pelvic and inguinal lymph nodes although evaluation is limited by streak artifact from left hip arthroplasty. For reference: -retrocaval lymph node measures 6 mm on image 70/301 previously 11 mm -right pelvic sidewall lymph node measures 13 mm in short axis on image 103/301 previously 15 mm. -right inguinal lymph node measures 7 mm in short axis on image 121/301 previously 9 mm Reproductive: Evaluation of the pelvic structures is limited by streak artifact from left hip arthroplasty. Other: No significant abdominopelvic free fluid. No discrete peritoneal or omental nodularity. Musculoskeletal: No aggressive lytic or blastic lesion of bone. Left total hip arthroplasty. Similar sclerotic foci in the left iliac bone and right acetabulum favored to reflect bone islands. IMPRESSION: 1. Decreased size of the retroperitoneal pelvic and inguinal lymph nodes. 2. Stable scattered tiny pulmonary nodules, favored benign. 3. No evidence of new or progressive disease in the chest, abdomen or pelvis. 4.  Aortic Atherosclerosis (ICD10-I70.0). Electronically Signed   By: Reyes Holder M.D.   On: 05/21/2023 16:26        PHYSICAL EXAMINATION: ECOG PERFORMANCE STATUS: 1 - Symptomatic but completely ambulatory  Vitals:   07/13/23 1241  BP: 114/76  Pulse: 69  Resp: 18  Temp: 98.5 F (36.9 C)  SpO2: 99%  Filed Weights    07/13/23 1241  Weight: 141 lb 3.2 oz (64 kg)    GENERAL:alert, no distress and comfortable  LABORATORY DATA:  I have reviewed the data as listed    Component Value Date/Time   NA 140 06/01/2023 1301   K 3.8 06/01/2023 1301   CL 104 06/01/2023 1301   CO2 30 06/01/2023 1301   GLUCOSE 86 06/01/2023 1301   BUN 18 06/01/2023 1301   CREATININE 0.76 06/01/2023 1301   CREATININE 0.83 12/14/2016 1247   CALCIUM 9.1 06/01/2023 1301   PROT 6.3 (L) 06/01/2023 1301   ALBUMIN 4.0 06/01/2023 1301   AST 17 06/01/2023 1301   ALT 9 06/01/2023 1301   ALKPHOS 72 06/01/2023 1301   BILITOT 0.4 06/01/2023 1301   GFRNONAA >60 06/01/2023 1301   GFRNONAA 81 11/28/2015 1516   GFRAA >60 12/21/2015 0432   GFRAA >89 11/28/2015 1516    No results found for: SPEP, UPEP  Lab Results  Component Value Date   WBC 6.1 07/13/2023   NEUTROABS 4.3 07/13/2023   HGB 10.9 (L) 07/13/2023   HCT 31.9 (L) 07/13/2023   MCV 92.5 07/13/2023   PLT 222 07/13/2023      Chemistry      Component Value Date/Time   NA 140 06/01/2023 1301   K 3.8 06/01/2023 1301   CL 104 06/01/2023 1301   CO2 30 06/01/2023 1301   BUN 18 06/01/2023 1301   CREATININE 0.76 06/01/2023 1301   CREATININE 0.83 12/14/2016 1247      Component Value Date/Time   CALCIUM 9.1 06/01/2023 1301   ALKPHOS 72 06/01/2023 1301   AST 17 06/01/2023 1301   ALT 9 06/01/2023 1301   BILITOT 0.4 06/01/2023 1301

## 2023-07-14 LAB — T4: T4, Total: 8.3 ug/dL (ref 4.5–12.0)

## 2023-08-27 ENCOUNTER — Inpatient Hospital Stay: Payer: Medicare PPO | Attending: Gynecologic Oncology | Admitting: Hematology and Oncology

## 2023-08-27 ENCOUNTER — Inpatient Hospital Stay: Payer: Medicare PPO | Attending: Gynecologic Oncology

## 2023-08-27 ENCOUNTER — Inpatient Hospital Stay: Payer: Medicare PPO

## 2023-08-27 ENCOUNTER — Encounter: Payer: Self-pay | Admitting: Hematology and Oncology

## 2023-08-27 VITALS — BP 127/78 | HR 63 | Resp 16

## 2023-08-27 VITALS — BP 117/65 | HR 60 | Temp 98.1°F | Resp 18 | Ht 62.0 in | Wt 141.6 lb

## 2023-08-27 DIAGNOSIS — Z5112 Encounter for antineoplastic immunotherapy: Secondary | ICD-10-CM | POA: Insufficient documentation

## 2023-08-27 DIAGNOSIS — C55 Malignant neoplasm of uterus, part unspecified: Secondary | ICD-10-CM

## 2023-08-27 DIAGNOSIS — T451X5A Adverse effect of antineoplastic and immunosuppressive drugs, initial encounter: Secondary | ICD-10-CM

## 2023-08-27 DIAGNOSIS — R21 Rash and other nonspecific skin eruption: Secondary | ICD-10-CM

## 2023-08-27 DIAGNOSIS — Z7901 Long term (current) use of anticoagulants: Secondary | ICD-10-CM | POA: Diagnosis not present

## 2023-08-27 DIAGNOSIS — D6481 Anemia due to antineoplastic chemotherapy: Secondary | ICD-10-CM

## 2023-08-27 DIAGNOSIS — I82411 Acute embolism and thrombosis of right femoral vein: Secondary | ICD-10-CM

## 2023-08-27 DIAGNOSIS — Z79899 Other long term (current) drug therapy: Secondary | ICD-10-CM | POA: Insufficient documentation

## 2023-08-27 LAB — CMP (CANCER CENTER ONLY)
ALT: 9 U/L (ref 0–44)
AST: 16 U/L (ref 15–41)
Albumin: 4 g/dL (ref 3.5–5.0)
Alkaline Phosphatase: 78 U/L (ref 38–126)
Anion gap: 4 — ABNORMAL LOW (ref 5–15)
BUN: 14 mg/dL (ref 8–23)
CO2: 32 mmol/L (ref 22–32)
Calcium: 9.2 mg/dL (ref 8.9–10.3)
Chloride: 101 mmol/L (ref 98–111)
Creatinine: 0.74 mg/dL (ref 0.44–1.00)
GFR, Estimated: >60 mL/min (ref >60)
Glucose, Bld: 87 mg/dL (ref 70–99)
Potassium: 4 mmol/L (ref 3.5–5.1)
Sodium: 137 mmol/L (ref 135–145)
Total Bilirubin: 0.5 mg/dL (ref 0.0–1.2)
Total Protein: 6.7 g/dL (ref 6.5–8.1)

## 2023-08-27 LAB — CBC WITH DIFFERENTIAL (CANCER CENTER ONLY)
Abs Immature Granulocytes: 0.02 10*3/uL (ref 0.00–0.07)
Basophils Absolute: 0 10*3/uL (ref 0.0–0.1)
Basophils Relative: 0 %
Eosinophils Absolute: 0.1 10*3/uL (ref 0.0–0.5)
Eosinophils Relative: 2 %
HCT: 33 % — ABNORMAL LOW (ref 36.0–46.0)
Hemoglobin: 11 g/dL — ABNORMAL LOW (ref 12.0–15.0)
Immature Granulocytes: 0 %
Lymphocytes Relative: 24 %
Lymphs Abs: 1.3 10*3/uL (ref 0.7–4.0)
MCH: 30.6 pg (ref 26.0–34.0)
MCHC: 33.3 g/dL (ref 30.0–36.0)
MCV: 91.7 fL (ref 80.0–100.0)
Monocytes Absolute: 0.5 10*3/uL (ref 0.1–1.0)
Monocytes Relative: 9 %
Neutro Abs: 3.6 10*3/uL (ref 1.7–7.7)
Neutrophils Relative %: 65 %
Platelet Count: 210 10*3/uL (ref 150–400)
RBC: 3.6 MIL/uL — ABNORMAL LOW (ref 3.87–5.11)
RDW: 11.6 % (ref 11.5–15.5)
WBC Count: 5.5 10*3/uL (ref 4.0–10.5)
nRBC: 0 % (ref 0.0–0.2)

## 2023-08-27 LAB — TSH: TSH: 0.812 u[IU]/mL (ref 0.350–4.500)

## 2023-08-27 MED ORDER — SODIUM CHLORIDE 0.9% FLUSH
10.0000 mL | Freq: Once | INTRAVENOUS | Status: AC
  Administered 2023-08-27: 10 mL

## 2023-08-27 MED ORDER — SODIUM CHLORIDE 0.9 % IV SOLN: Freq: Once | INTRAVENOUS | Status: AC

## 2023-08-27 MED ORDER — HEPARIN SOD (PORK) LOCK FLUSH 100 UNIT/ML IV SOLN
500.0000 [IU] | Freq: Once | INTRAVENOUS | Status: AC | PRN
  Administered 2023-08-27: 500 [IU]

## 2023-08-27 MED ORDER — SODIUM CHLORIDE 0.9 % IV SOLN
400.0000 mg | Freq: Once | INTRAVENOUS | Status: AC
  Administered 2023-08-27: 400 mg via INTRAVENOUS
  Filled 2023-08-27: qty 16

## 2023-08-27 MED ORDER — SODIUM CHLORIDE 0.9% FLUSH
10.0000 mL | INTRAVENOUS | Status: DC | PRN
  Administered 2023-08-27: 10 mL

## 2023-08-27 NOTE — Assessment & Plan Note (Addendum)
 She has completed anticoagulation therapy I recommend 81 mg aspirin for secondary prevention

## 2023-08-27 NOTE — Assessment & Plan Note (Addendum)
 She has nonspecific skin rash, likely support keratosis exacerbated by recent exposure to chemotherapy I do not believe this is due to side effects of immunotherapy Recommend conservative approach and close monitoring

## 2023-08-27 NOTE — Assessment & Plan Note (Addendum)
 Anticipate improvement of her blood count with time away from chemotherapy

## 2023-08-27 NOTE — Patient Instructions (Signed)

## 2023-08-27 NOTE — Progress Notes (Signed)
 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, responded very well with complete response with neoadjuvant chemotherapy with combination of carboplatin, paclitaxel and pembrolizumab followed by surgery and maintenance immunotherapy with pembrolizumab Er neg, MSI stable, PD-L1 80%   She is improving clinically after discontinuation of chemotherapy She had mild skin rash which I do not believe is related to side effects of treatment Plan will be to continue treatment as scheduled Next imaging study would be in May Acute deep vein thrombosis (DVT) of femoral vein of right lower extremity (HCC) She has completed anticoagulation therapy I recommend 81 mg aspirin for secondary prevention Anemia due to antineoplastic chemotherapy Anticipate improvement of her blood count with time away from chemotherapy Rash and nonspecific skin eruption She has nonspecific skin rash, likely support keratosis exacerbated by recent exposure to chemotherapy I do not believe this is due to side effects of immunotherapy Recommend conservative approach and close monitoring  Orders Placed This Encounter  Procedures   CBC with Differential (Cancer Center Only)    Standing Status:   Future    Expected Date:   10/19/2023    Expiration Date:   10/18/2024   CMP (Cancer Center only)    Standing Status:   Future    Expected Date:   10/19/2023    Expiration Date:   10/18/2024   T4    Standing Status:   Future    Expected Date:   10/19/2023    Expiration Date:   10/18/2024   TSH    Standing Status:   Future    Expected Date:   10/19/2023    Expiration Date:   10/18/2024   CBC with Differential (Cancer Center Only)     Standing Status:   Future    Expected Date:   11/30/2023    Expiration Date:   11/29/2024   CMP (Cancer Center only)    Standing Status:   Future    Expected Date:   11/30/2023    Expiration Date:   11/29/2024   T4    Standing Status:   Future    Expected Date:   11/30/2023    Expiration Date:   11/29/2024   TSH    Standing Status:   Future    Expected Date:   11/30/2023    Expiration Date:   11/29/2024   CBC with Differential (Cancer Center Only)    Standing Status:   Future    Expected Date:   01/11/2024    Expiration Date:   01/10/2025   CMP (Cancer Center only)    Standing Status:   Future    Expected Date:   01/11/2024    Expiration Date:   01/10/2025   T4    Standing Status:   Future    Expected Date:   01/11/2024    Expiration Date:   01/10/2025   TSH    Standing Status:   Future    Expected Date:   01/11/2024    Expiration Date:   01/10/2025     Artis Delay, MD  INTERVAL HISTORY: she returns for treatment follow-up Complications related to previous cycle of chemotherapy included anemia,, rash,, and fatigue, Her skin rash is located in her back and front of her torso  It is not debilitating She started back playing tennis with improvement of her energy level She still have mild intermittent leg edema  PHYSICAL EXAMINATION: ECOG PERFORMANCE STATUS: 1 - Symptomatic but completely ambulatory  Vitals:   08/27/23 1256  BP: 117/65  Pulse: 60  Resp: 18  Temp: 98.1 F (36.7 C)  SpO2: 100%   Filed Weights   08/27/23 1256  Weight: 141 lb 9.6 oz (64.2 kg)    Relevant data reviewed during this visit included CBC and CMP

## 2023-08-27 NOTE — Assessment & Plan Note (Addendum)
 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

## 2023-08-28 LAB — T4: T4, Total: 8.7 ug/dL (ref 4.5–12.0)

## 2023-08-30 ENCOUNTER — Encounter: Payer: Self-pay | Admitting: Psychiatry

## 2023-08-30 ENCOUNTER — Inpatient Hospital Stay: Payer: Medicare PPO | Attending: Gynecologic Oncology | Admitting: Psychiatry

## 2023-08-30 VITALS — BP 119/62 | HR 69 | Temp 98.3°F | Resp 18 | Wt 141.6 lb

## 2023-08-30 DIAGNOSIS — I890 Lymphedema, not elsewhere classified: Secondary | ICD-10-CM | POA: Insufficient documentation

## 2023-08-30 DIAGNOSIS — Z90722 Acquired absence of ovaries, bilateral: Secondary | ICD-10-CM | POA: Insufficient documentation

## 2023-08-30 DIAGNOSIS — N951 Menopausal and female climacteric states: Secondary | ICD-10-CM

## 2023-08-30 DIAGNOSIS — Z9079 Acquired absence of other genital organ(s): Secondary | ICD-10-CM | POA: Diagnosis not present

## 2023-08-30 DIAGNOSIS — R61 Generalized hyperhidrosis: Secondary | ICD-10-CM

## 2023-08-30 DIAGNOSIS — Z90710 Acquired absence of both cervix and uterus: Secondary | ICD-10-CM | POA: Diagnosis not present

## 2023-08-30 DIAGNOSIS — C549 Malignant neoplasm of corpus uteri, unspecified: Secondary | ICD-10-CM

## 2023-08-30 DIAGNOSIS — C55 Malignant neoplasm of uterus, part unspecified: Secondary | ICD-10-CM

## 2023-08-30 DIAGNOSIS — G5781 Other specified mononeuropathies of right lower limb: Secondary | ICD-10-CM

## 2023-08-30 DIAGNOSIS — Z9221 Personal history of antineoplastic chemotherapy: Secondary | ICD-10-CM | POA: Diagnosis not present

## 2023-08-30 NOTE — Patient Instructions (Signed)
 It was a pleasure to see you in clinic today. - Exam looks good today - [name] and I will touch base on if Dr. Bertis Ruddy would like for me to see you back after your May scan or if we can space out further. [name] will close the loop with you tomorrow most likely.  Thank you very much for allowing me to provide care for you today.  I appreciate your confidence in choosing our Gynecologic Oncology team at [facility].  If you have any questions about your visit today please call our office or send [place] a MyChart message and we will get back to you as soon as possible.

## 2023-08-30 NOTE — Progress Notes (Signed)
 Gynecologic Oncology Return Clinic Visit  Date of Service: 08/30/2023 Referring Provider: [name], MD  [facility]  Assessment & Plan: [name] is a 73 y.o. 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 Continues on maintenance immunotherapy. Presents today for surveillance.  Gyn malignancy:  Completed adjuvant chemo. Continues on maintenance pembrolizumab. - CT from November 2024 reviewed. - NED on exam today -Signs/symptoms of recurrence reviewed.   - Continue maintenance pembro and surveillance. - Has follow-up with Dr. Bertis Ruddy 10/19/23. Planned repeat imaging in May 2025.  - Recommend q82mo follow-up. Can alternate with Dr. Bertis Ruddy.   Obturator nerve injury and repair: - Resolving - Not needing gabapentin for neuropathic pain - No functional limitations. Back to playing tennis  Lymphedema/lower extremity edema: - Had consultation with PT.  - She does not feel that wrapping is realistic for her to do, so she is not actively following with PT at this time.  Hotflashes/nightsweats - primarily at night and affecting sleep - Discussed she can try her gabapentin if still some available at home. Recommend 300mg  at bedtime.  - Other nonhormonal options available that can be tried if not improvement with gabapentin.  RTC 3-4 mo   [name], MD Gynecologic Oncology   Medical Decision Making I personally spent  TOTAL 30 minutes face-to-face and non-face-to-face in the care of this patient, which includes all pre, intra, and post visit time on the date of service.    ----------------------- Reason for Visit:  Surveillance  Treatment History: Oncology History Overview Note  Er neg, MSI stable, PD-L1 80% No genetic testing done, did not meet criteria   Uterine cancer (HCC)  10/23/2022 Imaging   1. 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 2. Multiple prominent palpable lymph nodes in the right groin measuring up to 2.1 cm, nonspecific. 3. Small right popliteal fossa Baker's cyst.   10/25/2022 Imaging   Ct imaging 1. Pelvic lymphadenopathy with the most prominent lymph node along the right iliac chain measuring 5.6 x 2.8 cm. Metastatic lymphadenopathy or lymphoproliferative disease cannot be excluded.  2. 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 3. Prominent right inguinal lymph nodes. 4. 8 mm sclerotic bone lesion within the right ischium, indeterminate. 5. Aortic atherosclerosis.   Aortic Atherosclerosis (ICD10-I70.0).       11/11/2022 Pathology Results   SURGICAL PATHOLOGY CASE: [id number] PATIENT: [name] Surgical Pathology Report  Specimen Submitted: A. Lymph node, right pelvic  Clinical History: Pelvic lymphadenopathy  DIAGNOSIS: A. LYMPH NODE, RIGHT PELVIC; CT-GUIDED CORE NEEDLE BIOPSY: - METASTATIC POORLY DIFFERENTIATED CARCINOMA, SEE COMMENT.  Comment: Immunohistochemical studies show tumor cells to be positive for CK7, MOC-31, GATA-3, CK5/6, and calretinin (subset). P16 is strongly and diffusely positive. P53 demonstrates an absence of staining within tumor cells, indicative of a null staining pattern. PAX-8, WT-1, D240, TTF-1, cdx-2, and p40 are negative. The pattern of immunohistochemical staining is non-specific. Based on the morphologic features and pattern of immunohistochemical staining the differential diagnosis includes metastatic urothelial  carcinoma, metastatic breast carcinoma, and metastatic serous carcinoma  of gyn origin. Correlation with radiographic findings is required.  There is sufficient tissue present for ancillary molecular testing.  IHC slides were prepared by [facility], [place], [place]. All controls stained appropriately.   11/16/2022 PET scan   1. Examination is positive for tracer avid adenopathy within the retroperitoneum, bilateral pelvis and right inguinal region. Findings are compatible with nodal metastasis. Primary neoplasm is not apparent on the current exam.  2. No signs of solid organ metastasis within the abdomen or pelvis. 3. No signs of tracer avid disease above the diaphragm. 4. Asymmetric subcutaneous edema is identified involving the visualized portions of the right lower extremity. 5. 5 mm perifissural nodule in the left mid lung is too small to characterize by PET-CT. 6. Increased uptake within both lobes of thyroid gland, right greater than left. Correlation with thyroid function tests advised. 7.  Aortic Atherosclerosis (ICD10-I70.0).   11/19/2022 Initial Diagnosis   Gynecologic malignancy (HCC)   11/20/2022 Cancer Staging   Staging form: Corpus Uteri - Carcinoma and Carcinosarcoma, AJCC 8th Edition - Clinical stage from 11/20/2022: FIGO Stage IVB (cTX, cN2a, pM1) - Signed by Artis Delay, MD on 11/20/2022 Stage prefix: Initial diagnosis   12/02/2022 Procedure   Ultrasound and fluoroscopically guided right internal jugular single lumen power port catheter insertion. Tip in the SVC/RA junction. Catheter ready for use.   12/04/2022 - 12/04/2022 Chemotherapy   Patient is on Treatment Plan : UTERINE Carboplatin AUC 6 + Paclitaxel q21d     12/04/2022 -  Chemotherapy   Patient is on Treatment Plan : UTERINE Pembrolizumab (200), Paclitaxel (175), Carboplatin (5) q21d x 6 cycles / Pembrolizumab (400) q42d     12/15/2022 Imaging   1. No adnexal mass. No uterine mass lesion  evident and there is no substantial thickening of the endometrium by MRI. Low signal intensity fibrous stroma of the cervix appears preserved although high signal intensity of the cervical mucosa is somewhat prominent. This could be correlated with Pap smear as clinically warranted. 2. Stable right pelvic sidewall and groin lymphadenopathy. 3. Small Bartholin's cysts bilaterally.   02/05/2023 Imaging   CT CHEST ABDOMEN PELVIS W CONTRAST  Result Date: 02/05/2023 CLINICAL DATA:  History of endometrial cancer, monitor. High-risk. * Tracking Code: BO * EXAM: CT CHEST, ABDOMEN, AND PELVIS WITH CONTRAST TECHNIQUE: Multidetector CT imaging of the chest, abdomen and pelvis was performed following the standard protocol during bolus administration of intravenous contrast. RADIATION DOSE REDUCTION: This exam was performed according to the departmental dose-optimization program which includes automated exposure control, adjustment of the mA and/or kV according to patient size and/or use of iterative reconstruction technique. CONTRAST:  OMNIPAQUE IOHEXOL 300 MG/ML  SOLN COMPARISON:  Multiple priors including MRI pelvis December 15, 2022 PET-CT Nov 16, 2022 and CT abdomen pelvis October 25, 2022. FINDINGS: CT CHEST FINDINGS Cardiovascular: Right chest wall Port-A-Cath with the tip in the right atrium. Aortic atherosclerosis. Normal caliber thoracic aorta. No central pulmonary embolus on this nondedicated study. Normal size heart. No significant pericardial effusion/thickening Mediastinum/Nodes: No suspicious thyroid nodule. No pathologically enlarged mediastinal, hilar or axillary lymph nodes the esophagus is grossly unremarkable. Lungs/Pleura: Stable scattered tiny pulmonary nodules. For instance: -2 mm right upper lobe pulmonary nodule on image 29/3, unchanged -2 mm pulmonary nodule along the right major fissure on image 73/3, unchanged -4 mm pulmonary nodule along the left major fissure on image 73/3, unchanged. No  pleural effusion.  No pneumothorax. Musculoskeletal: No suspicious chest wall lesion. No aggressive  lytic or blastic lesion of bone. CT ABDOMEN PELVIS FINDINGS Hepatobiliary: Diffuse hepatic steatosis. Gallbladder is unremarkable. No biliary ductal dilation Pancreas: No pancreatic ductal dilation or evidence of acute inflammation Spleen: No splenomegaly. Adrenals/Urinary Tract: Bilateral adrenal glands appear normal. No hydronephrosis. Kidneys demonstrate symmetric enhancement. No suspicious renal mass. Urinary bladder is not well evaluated due to underdistention and streak artifact from left hip arthroplasty. Stomach/Bowel: No radiopaque enteric contrast material was administered. Hyperdensity along the posterior aspect of the stomach on image 53/2 appears similar on delayed imaging and is compatible with ingested material. No pathologic dilation of large or small bowel. No evidence of acute bowel inflammation. Vascular/Lymphatic: Normal caliber abdominal aorta. Aortic atherosclerosis. The portal, splenic and superior mesenteric veins are patent. Decreased size of the retroperitoneal, bilateral pelvic sidewall/iliac side chain and right inguinal lymph nodes. For reference: -Retrocaval lymph node measures 6 mm in short axis on image 69/2 previously 11 mm -Right sidewall lymph node measures 1.5 cm in short axis on image 110/2 previously 2.9 cm Reproductive: Evaluation of the pelvic reproductive structures is limited by streak artifact from left hip arthroplasty. Other: No significant abdominopelvic free fluid. No discrete peritoneal or omental nodularity. Musculoskeletal: No aggressive lytic or blastic lesion of bone. Multilevel degenerative changes spine. Left total hip arthroplasty. Degenerative change of the right hip. IMPRESSION: 1. Decreased size of the retroperitoneal, bilateral pelvic sidewall/iliac side chain and right inguinal lymph nodes, compatible with treatment response. 2. Stable scattered tiny  pulmonary nodules, nonspecific but favored benign. Suggest continued attention on follow-up imaging 3. No evidence of new or progressive metastatic disease within the chest, abdomen or pelvis. 4. Diffuse hepatic steatosis. 5.  Aortic Atherosclerosis (ICD10-I70.0). Electronically Signed   By: Maudry Mayhew M.D.   On: 02/05/2023 15:03   XR Wrist Complete Right  Result Date: 01/13/2023 Right wrist  3 views: Right wrist distal radius fracture remains unchanged in overall position alignment.  Fracture is healed.  No acute findings otherwise.     02/16/2023 Pathology Results    SURGICAL PATHOLOGY CASE: [id number] PATIENT: [name] Surgical Pathology Report  Clinical History: poorly differentiated carcinoma of likely GYN origin  FINAL MICROSCOPIC DIAGNOSIS:  A. UTERUS, CERVIX, BILATERAL FALLOPIAN TUBES, AND OVARIES, RESECTION: Cervix     Nabothian cysts     Negative for dysplasia or malignancy Endometrium     Atrophic     Negative for atypia, hyperplasia or malignancy Myometrium     Unremarkable with focal vascular calcifications     Negative for malignancy Right ovary     Focal paraovarian, inactive endometriosis     Negative for malignancy Left ovary     Unremarkable     Negative for endometriosis or malignancy Right fallopian tube     Not identified in entirely submitted right adnexa Left fallopian tube     Not identified in entirely submitted left adnexa  B. RIGHT PELVIC LYMPH NODE, EXCISION: Lymph node with hyalin fibrosis, focal necrosis and dystrophic calcifications Focal perinodal fat necrosis Negative for residual, viable carcinoma See comment  C. SEGMENT OF OBTURATOR NERVE, EXCISION: Benign nerve Negative for malignancy  D. OMENTUM: Benign adipose tissue Negative for malignancy  COMMENT: B. The right pelvic lymph node is entirely submitted and shows large areas of hyalin fibrosis focally associated with necrosis and dystrophic calcifications.  The  findings are consistent with tumor regression although no residual, viable carcinoma is identified.     02/16/2023 Surgery   GYNECOLOGIC ONCOLOGY OPERATIVE NOTE   Date of Service: 02/16/2023  Preoperative Diagnosis: Poorly differentiated carcinoma of likely GYN origin    Postoperative Diagnosis: Same, obturator nerve injury and repair   Procedures: Robotic-assisted total laparoscopic hysterectomy, bilateral salpingo-oophorectomy, tumor debulking of right pelvic lymph node and minilaparotomy for omentectomy, repair of right obturator nerve  Findings: On bimanual exam, small mobile uterus, no adnexal mass. No palpable right lymphadenopathy. On entry to abdomen, normal upper abdominal survey with smooth diaphragm, liver, stomach and normal appearing omentum and bowel. Nodule in the gallbladder fossa (image in media). In the pelvis, filmy adhesions of the sigmoid colon to the left pelvic sidewall. Otherwise small, normal appearing uterus and bilateral fallopian tubes and ovaries. Right pelvic sidewall with enlarged lymph node, approximately 4x2cm, densely adherent to the pelvic sidewall, external iliac vein. Densely adherent and encasing the obturator nerve and artery. With careful dissection, unavoidable avulsion injury occurred to the obturator artery. Due to the densely adherent nature of the lymph node, the nerve and artery were initially inseparable. In obtaining control of the bleeding from the obturator artery, unavoidable crush and cautery injury to the obturator nerve. Following removal of the involved node, the affected portion of the obturator nerve was resected and well reapproximated. No other gross evidence of disease in the abdomen or pelvis.    05/21/2023 Imaging   CT CHEST ABDOMEN PELVIS W CONTRAST  Result Date: 05/21/2023 CLINICAL DATA:  History of endometrial cancer, monitor. * Tracking Code: BO *. EXAM: CT CHEST, ABDOMEN, AND PELVIS WITH CONTRAST TECHNIQUE: Multidetector CT  imaging of the chest, abdomen and pelvis was performed following the standard protocol during bolus administration of intravenous contrast. RADIATION DOSE REDUCTION: This exam was performed according to the departmental dose-optimization program which includes automated exposure control, adjustment of the mA and/or kV according to patient size and/or use of iterative reconstruction technique. CONTRAST:  OMNIPAQUE IOHEXOL 300 MG/ML  SOLN COMPARISON:  Multiple priors including most recent CT February 05, 2023. FINDINGS: CT CHEST FINDINGS Cardiovascular: Accessed right chest Port-A-Cath with tip in the right atrium. Aortic atherosclerosis. No central pulmonary embolus on this nondedicated study. Normal size heart. No significant pericardial effusion/thickening. Mediastinum/Nodes: No suspicious thyroid nodule. No pathologically enlarged mediastinal, hilar or axillary lymph nodes. The esophagus is grossly unremarkable. Lungs/Pleura: Stable scattered tiny pulmonary nodules. No new suspicious pulmonary nodules or masses. For reference: -right upper lobe pulmonary nodule measuring 2 mm on image 27/302 -pulmonary nodule along the right major fissure measuring 2 mm on image 66/302 -pulmonary nodule along the left major fissure measuring 4 mm on image 63/302. Musculoskeletal: No aggressive lytic or blastic lesion of bone. CT ABDOMEN PELVIS FINDINGS Hepatobiliary: No suspicious hepatic lesion. Gallbladder is unremarkable. No biliary ductal dilation. Pancreas: No pancreatic ductal dilation or evidence of acute inflammation. Spleen: No splenomegaly. Adrenals/Urinary Tract: Bilateral adrenal glands appear normal. No hydronephrosis. Kidneys demonstrate symmetric enhancement. Urinary bladder not well evaluated due to streak artifact from left hip arthroplasty. Stomach/Bowel: No radiopaque enteric contrast material administered. Stomach is unremarkable for degree of distension. No pathologic dilation of small or large bowel. No  evidence of acute bowel inflammation. Vascular/Lymphatic: Normal caliber abdominal aorta. Aortic atherosclerosis. The portal, splenic and superior mesenteric veins are patent. Decreased size of the retroperitoneal, pelvic and inguinal lymph nodes although evaluation is limited by streak artifact from left hip arthroplasty. For reference: -retrocaval lymph node measures 6 mm on image 70/301 previously 11 mm -right pelvic sidewall lymph node measures 13 mm in short axis on image 103/301 previously 15 mm. -right inguinal lymph node measures 7  mm in short axis on image 121/301 previously 9 mm Reproductive: Evaluation of the pelvic structures is limited by streak artifact from left hip arthroplasty. Other: No significant abdominopelvic free fluid. No discrete peritoneal or omental nodularity. Musculoskeletal: No aggressive lytic or blastic lesion of bone. Left total hip arthroplasty. Similar sclerotic foci in the left iliac bone and right acetabulum favored to reflect bone islands. IMPRESSION: 1. Decreased size of the retroperitoneal pelvic and inguinal lymph nodes. 2. Stable scattered tiny pulmonary nodules, favored benign. 3. No evidence of new or progressive disease in the chest, abdomen or pelvis. 4.  Aortic Atherosclerosis (ICD10-I70.0). Electronically Signed   By: Maudry Mayhew M.D.   On: 05/21/2023 16:26        Interval History: Patient reports that she is overall doing well.  She returned to playing tennis in December and has been playing since.  She also reports she is walking a lot.  Feels she is about 75 to 80% back to normal with her tennis playing in regards to her obturator nerve injury.  She does feel like her right thigh is still little bit swollen but no worsening of lymphedema. No new vaginal bleeding, abdominal/pelvic pain, unintentional weight loss, change in bowel or bladder habits, early satiety, bloating, nausea/vomiting.     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   Top of Left Hand(Tx p Bx)    Past Surgical History:  Procedure Laterality Date   CARPAL TUNNEL RELEASE     BILAT     CATARACT EXTRACTION     BILAT    ECTOPIC PREGNANCY SURGERY     in the setting of prior BTL, lapartomy   EYE SURGERY     IR IMAGING GUIDED PORT INSERTION  12/02/2022   KNEE CARTILAGE SURGERY     RIGHT    (MENISCUS)   LAPAROTOMY N/A 02/16/2023   Procedure: MINI LAPAROTOMY;  Surgeon: [name], MD;  Location: [facility];  Service: Gynecology;  Laterality: N/A;   LYMPH NODE DISSECTION Right 02/16/2023   Procedure: RIGHT PELVIC LYMPH NODE DISSECTION;  Surgeon: [name], MD;  Location: [facility];  Service: Gynecology;  Laterality: Right;   MOHS SURGERY     left nose/inner eye for basal cell   TONSILLECTOMY     TOTAL HIP ARTHROPLASTY Left 12/19/2015   TOTAL HIP ARTHROPLASTY Left 12/19/2015   Procedure: LEFT TOTAL HIP ARTHROPLASTY;  Surgeon: Valeria Batman, MD;  Location: [facility];  Service: Orthopedics;  Laterality: Left;   TRIGGER FINGER RELEASE     TUBAL LIGATION      Family History  Problem Relation Age of Onset   Uterine cancer Mother 64   Heart attack Mother    Hypertension Mother    Kidney cancer Father 39   Lung cancer Father    Diabetes Sister    Thyroid disease  Neg Hx    Colon cancer Neg Hx    Breast cancer Neg Hx    Ovarian cancer Neg Hx    Endometrial cancer Neg Hx    Pancreatic cancer Neg Hx    Prostate cancer Neg Hx      Social History   Socioeconomic History   Marital status: Married    Spouse name: Not on file   Number of children: Not on file   Years of education: Not on file   Highest education level: Not on file  Occupational History   Not on file  Tobacco Use   Smoking status: Never   Smokeless tobacco: Never  Vaping Use   Vaping status: Never Used  Substance and Sexual Activity   Alcohol use: Yes    Alcohol/week: 0.0 standard drinks of alcohol    Comment: Rare   Drug use: No   Sexual activity: Yes    Birth control/protection: Surgical, Post-menopausal    Comment: Tubal lig-1st intercourse 73 yo-Fewer than 5 partners  Other Topics Concern   Not on file  Social History Narrative   Not on file   Social Drivers of Health   Financial Resource Strain: Not on file  Food Insecurity: Low Risk  (12/17/2022)   [facility]   Hunger Vital Sign    Worried About Running Out of Food in the Last Year: Never true    Ran Out of Food in the Last Year: Never true  Transportation Needs: No Transportation Needs (12/17/2022)   [facility]   Transportation    In the past 12 months, has lack of reliable transportation kept you from medical appointments, meetings, work or from getting things needed for daily living? : No  Physical Activity: Not on file  Stress: Not on file  Social Connections: Not on file    Current Medications:  Current Outpatient Medications:    acetaminophen (TYLENOL) 500 MG tablet, Take 1,000 mg by mouth every 6 (six) hours as needed for moderate pain., Disp: , Rfl:    calcium carbonate (TUMS) 500 MG chewable tablet, Chew 1 tablet by mouth daily., Disp: , Rfl:    Carboxymethylcellulose Sodium (THERATEARS OP), Place 1 drop into both eyes 3 (three) times daily., Disp: , Rfl:    Cholecalciferol (VITAMIN D) 50 MCG (2000 UT) tablet, Take 2,000 Units by mouth daily., Disp: , Rfl:    diclofenac sodium (VOLTAREN) 1 % GEL,  Apply 2-4 g topically 4 (four) times daily. (Patient taking differently: Apply 2-4 g topically 4 (four) times daily as needed (pain).), Disp: 3 Tube, Rfl: 3   levothyroxine (SYNTHROID) 100 MCG tablet, Take 1 tablet (100 mcg total) by mouth daily., Disp: 60 tablet, Rfl: 3   lidocaine-prilocaine (EMLA) cream, Apply 1 Application topically as needed., Disp: 30 g, Rfl: 3   melatonin 5 MG TABS, Take 5 mg by mouth at bedtime as needed (sleep)., Disp: , Rfl:    Multiple Vitamin (MULTIVITAMIN) tablet, Take 1 tablet by mouth daily., Disp: , Rfl:    aspirin EC 81 MG tablet, Take 81 mg by mouth daily. Swallow whole., Disp: , Rfl:   Review of Symptoms: Complete 10-system review is positive for: Right thigh swelling, hot flashes, dry mouth  Physical Exam: BP 119/62 (BP Location: Left Arm, Patient Position: Sitting)   Pulse 69   Temp 98.3 F (36.8 C) (Oral)   Resp 18   Wt 141 lb 9.6 oz (64.2 kg)   SpO2 100%  BMI 25.90 kg/m  General: Alert, oriented, no acute distress. HEENT: Normocephalic, atraumatic. Neck symmetric without masses. Sclera anicteric.  Chest: Normal work of breathing. Clear to auscultation bilaterally.   Cardiovascular: Regular rate and rhythm, no murmurs. Abdomen: Soft, nontender.  Normoactive bowel sounds.  No masses appreciated.  Well-healed incisions. Extremities: Grossly normal range of motion. Warm, well perfused.  No visible edema in bilateral lower extremities although patient feels edematous still in her right upper thigh.   Skin: No rashes or lesions noted. Lymphatics: No cervical, supraclavicular adenopathy. Palpable but not enlarged right inguinal lymph node. GU: Normal appearing external genitalia without erythema, excoriation, or lesions.  Speculum exam reveals well-healed vaginal cuff, no lesions. Bimanual exam reveals smooth vaginal cuff, no nodularity or pelvic mass.  Exam chaperoned by Kimberly [place], CMA   Laboratory & Radiologic Studies: CT CHEST ABDOMEN PELVIS  W CONTRAST 05/21/2023  Narrative CLINICAL DATA:  History of endometrial cancer, monitor. * Tracking Code: BO *.  EXAM: CT CHEST, ABDOMEN, AND PELVIS WITH CONTRAST  TECHNIQUE: Multidetector CT imaging of the chest, abdomen and pelvis was performed following the standard protocol during bolus administration of intravenous contrast.  RADIATION DOSE REDUCTION: This exam was performed according to the departmental dose-optimization program which includes automated exposure control, adjustment of the mA and/or kV according to patient size and/or use of iterative reconstruction technique.  CONTRAST:  OMNIPAQUE IOHEXOL 300 MG/ML  SOLN  COMPARISON:  Multiple priors including most recent CT February 05, 2023.  FINDINGS: CT CHEST FINDINGS  Cardiovascular: Accessed right chest Port-A-Cath with tip in the right atrium. Aortic atherosclerosis. No central pulmonary embolus on this nondedicated study. Normal size heart. No significant pericardial effusion/thickening.  Mediastinum/Nodes: No suspicious thyroid nodule. No pathologically enlarged mediastinal, hilar or axillary lymph nodes. The esophagus is grossly unremarkable.  Lungs/Pleura: Stable scattered tiny pulmonary nodules. No new suspicious pulmonary nodules or masses. For reference:  -right upper lobe pulmonary nodule measuring 2 mm on image 27/302  -pulmonary nodule along the right major fissure measuring 2 mm on image 66/302  -pulmonary nodule along the left major fissure measuring 4 mm on image 63/302.  Musculoskeletal: No aggressive lytic or blastic lesion of bone.  CT ABDOMEN PELVIS FINDINGS  Hepatobiliary: No suspicious hepatic lesion. Gallbladder is unremarkable. No biliary ductal dilation.  Pancreas: No pancreatic ductal dilation or evidence of acute inflammation.  Spleen: No splenomegaly.  Adrenals/Urinary Tract: Bilateral adrenal glands appear normal. No hydronephrosis. Kidneys demonstrate symmetric  enhancement. Urinary bladder not well evaluated due to streak artifact from left hip arthroplasty.  Stomach/Bowel: No radiopaque enteric contrast material administered. Stomach is unremarkable for degree of distension. No pathologic dilation of small or large bowel. No evidence of acute bowel inflammation.  Vascular/Lymphatic: Normal caliber abdominal aorta. Aortic atherosclerosis. The portal, splenic and superior mesenteric veins are patent.  Decreased size of the retroperitoneal, pelvic and inguinal lymph nodes although evaluation is limited by streak artifact from left hip arthroplasty. For reference:  -retrocaval lymph node measures 6 mm on image 70/301 previously 11 mm  -right pelvic sidewall lymph node measures 13 mm in short axis on image 103/301 previously 15 mm.  -right inguinal lymph node measures 7 mm in short axis on image 121/301 previously 9 mm  Reproductive: Evaluation of the pelvic structures is limited by streak artifact from left hip arthroplasty.  Other: No significant abdominopelvic free fluid. No discrete peritoneal or omental nodularity.  Musculoskeletal: No aggressive lytic or blastic lesion of bone. Left total hip arthroplasty. Similar sclerotic foci  in the left iliac bone and right acetabulum favored to reflect bone islands.  IMPRESSION: 1. Decreased size of the retroperitoneal pelvic and inguinal lymph nodes. 2. Stable scattered tiny pulmonary nodules, favored benign. 3. No evidence of new or progressive disease in the chest, abdomen or pelvis. 4.  Aortic Atherosclerosis (ICD10-I70.0).   Electronically Signed By: Maudry Mayhew M.D. On: 05/21/2023 16:26

## 2023-08-31 ENCOUNTER — Other Ambulatory Visit: Payer: Self-pay

## 2023-10-10 ENCOUNTER — Encounter: Payer: Self-pay | Admitting: Hematology and Oncology

## 2023-10-14 ENCOUNTER — Encounter: Payer: Self-pay | Admitting: Hematology and Oncology

## 2023-10-19 ENCOUNTER — Inpatient Hospital Stay: Payer: Medicare PPO

## 2023-10-19 ENCOUNTER — Inpatient Hospital Stay: Payer: Medicare PPO | Attending: Gynecologic Oncology | Admitting: Hematology and Oncology

## 2023-10-19 ENCOUNTER — Encounter: Payer: Self-pay | Admitting: Hematology and Oncology

## 2023-10-19 VITALS — BP 120/68 | HR 58 | Temp 97.5°F | Resp 18 | Ht 62.0 in | Wt 141.5 lb

## 2023-10-19 VITALS — HR 71

## 2023-10-19 DIAGNOSIS — R21 Rash and other nonspecific skin eruption: Secondary | ICD-10-CM

## 2023-10-19 DIAGNOSIS — R232 Flushing: Secondary | ICD-10-CM

## 2023-10-19 DIAGNOSIS — D649 Anemia, unspecified: Secondary | ICD-10-CM | POA: Diagnosis not present

## 2023-10-19 DIAGNOSIS — Z9221 Personal history of antineoplastic chemotherapy: Secondary | ICD-10-CM | POA: Diagnosis not present

## 2023-10-19 DIAGNOSIS — Z79899 Other long term (current) drug therapy: Secondary | ICD-10-CM | POA: Insufficient documentation

## 2023-10-19 DIAGNOSIS — C55 Malignant neoplasm of uterus, part unspecified: Secondary | ICD-10-CM

## 2023-10-19 LAB — CMP (CANCER CENTER ONLY)
ALT: 9 U/L (ref 0–44)
AST: 15 U/L (ref 15–41)
Albumin: 4 g/dL (ref 3.5–5.0)
Alkaline Phosphatase: 78 U/L (ref 38–126)
Anion gap: 5 (ref 5–15)
BUN: 17 mg/dL (ref 8–23)
CO2: 29 mmol/L (ref 22–32)
Calcium: 9 mg/dL (ref 8.9–10.3)
Chloride: 102 mmol/L (ref 98–111)
Creatinine: 0.77 mg/dL (ref 0.44–1.00)
GFR, Estimated: >60 mL/min (ref >60)
Glucose, Bld: 118 mg/dL — ABNORMAL HIGH (ref 70–99)
Potassium: 3.6 mmol/L (ref 3.5–5.1)
Sodium: 136 mmol/L (ref 135–145)
Total Bilirubin: 0.5 mg/dL (ref 0.0–1.2)
Total Protein: 6.6 g/dL (ref 6.5–8.1)

## 2023-10-19 LAB — CBC WITH DIFFERENTIAL (CANCER CENTER ONLY)
Abs Immature Granulocytes: 0.02 10*3/uL (ref 0.00–0.07)
Basophils Absolute: 0 10*3/uL (ref 0.0–0.1)
Basophils Relative: 1 %
Eosinophils Absolute: 0.1 10*3/uL (ref 0.0–0.5)
Eosinophils Relative: 2 %
HCT: 31.3 % — ABNORMAL LOW (ref 36.0–46.0)
Hemoglobin: 10.5 g/dL — ABNORMAL LOW (ref 12.0–15.0)
Immature Granulocytes: 0 %
Lymphocytes Relative: 26 %
Lymphs Abs: 1.7 10*3/uL (ref 0.7–4.0)
MCH: 29.5 pg (ref 26.0–34.0)
MCHC: 33.5 g/dL (ref 30.0–36.0)
MCV: 87.9 fL (ref 80.0–100.0)
Monocytes Absolute: 0.5 10*3/uL (ref 0.1–1.0)
Monocytes Relative: 8 %
Neutro Abs: 4 10*3/uL (ref 1.7–7.7)
Neutrophils Relative %: 63 %
Platelet Count: 223 10*3/uL (ref 150–400)
RBC: 3.56 MIL/uL — ABNORMAL LOW (ref 3.87–5.11)
RDW: 12.3 % (ref 11.5–15.5)
WBC Count: 6.3 10*3/uL (ref 4.0–10.5)
nRBC: 0 % (ref 0.0–0.2)

## 2023-10-19 LAB — TSH: TSH: 0.803 u[IU]/mL (ref 0.350–4.500)

## 2023-10-19 MED ORDER — SODIUM CHLORIDE 0.9% FLUSH
10.0000 mL | INTRAVENOUS | Status: DC | PRN
  Administered 2023-10-19: 10 mL

## 2023-10-19 MED ORDER — SODIUM CHLORIDE 0.9 % IV SOLN
400.0000 mg | Freq: Once | INTRAVENOUS | Status: AC
  Administered 2023-10-19: 400 mg via INTRAVENOUS
  Filled 2023-10-19: qty 16

## 2023-10-19 MED ORDER — HEPARIN SOD (PORK) LOCK FLUSH 100 UNIT/ML IV SOLN
500.0000 [IU] | Freq: Once | INTRAVENOUS | Status: AC | PRN
Start: 2023-10-19 — End: 2023-10-19
  Administered 2023-10-19: 500 [IU]

## 2023-10-19 MED ORDER — SODIUM CHLORIDE 0.9% FLUSH
10.0000 mL | Freq: Once | INTRAVENOUS | Status: AC
  Administered 2023-10-19: 10 mL

## 2023-10-19 MED ORDER — SODIUM CHLORIDE 0.9 % IV SOLN: Freq: Once | INTRAVENOUS | Status: AC

## 2023-10-19 NOTE — Patient Instructions (Signed)

## 2023-10-20 ENCOUNTER — Encounter: Payer: Self-pay | Admitting: Hematology and Oncology

## 2023-10-20 LAB — T4: T4, Total: 8.5 ug/dL (ref 4.5–12.0)

## 2023-10-20 NOTE — Assessment & Plan Note (Addendum)
 This is likely due to recent treatment. The patient denies recent history of bleeding such as epistaxis, hematuria or hematochezia. She is asymptomatic from the anemia. I will observe for now.  She does not require transfusion now. I will continue the chemotherapy at current dose without dosage adjustment.  If the anemia gets progressive worse in the future, I might have to delay her treatment or adjust the chemotherapy dose.

## 2023-10-20 NOTE — Assessment & Plan Note (Addendum)
She declined medication for this

## 2023-10-20 NOTE — Assessment & Plan Note (Addendum)
 She has nonspecific skin rash, likely support keratosis exacerbated by recent exposure to chemotherapy I do not believe this is due to side effects of immunotherapy Recommend conservative approach and close monitoring

## 2023-10-20 NOTE — Progress Notes (Signed)
 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, responded very well with complete response with neoadjuvant chemotherapy with combination of carboplatin , paclitaxel  and pembrolizumab  followed by surgery and maintenance immunotherapy with pembrolizumab  Er neg, MSI stable, PD-L1 80%   She is improving clinically after discontinuation of chemotherapy She had mild skin rash which I do not believe is related to side effects of treatment Her intermittent hot flashes is likely due to menopausal symptoms Plan will be to continue treatment as scheduled Next imaging study would be in May Rash and nonspecific skin eruption She has nonspecific skin rash, likely support keratosis exacerbated by recent exposure to chemotherapy I do not believe this is due to side effects of immunotherapy Recommend conservative approach and close monitoring Hot flashes She declined medication for this Mild anemia This is likely due to recent treatment. The patient denies recent history of bleeding such as epistaxis, hematuria or hematochezia. She is asymptomatic from the anemia. I will observe for now.  She does not require transfusion now. I will continue the chemotherapy at current dose without dosage adjustment.  If the anemia gets progressive worse in the future, I might have to delay her treatment or adjust the chemotherapy dose.   Orders Placed This Encounter  Procedures   CT ABDOMEN PELVIS W CONTRAST    Standing Status:   Future    Expected Date:   11/23/2023    Expiration Date:   10/18/2024    Scheduling Instructions:     No oral contrast    If indicated for the ordered procedure, I authorize the administration of  contrast media per Radiology protocol:   Yes    Does the patient have a contrast media/X-ray dye allergy?:   No    Preferred imaging location?:   [facility]    If indicated for the ordered procedure, I authorize the administration of oral contrast media per Radiology protocol:   No    Reason for no oral contrast::   No need oral contrast     Lisa Jacobs, MD  INTERVAL HISTORY: she returns for treatment follow-up Complications related to previous cycle of chemotherapy included anemia,, rash,, and hot flashes We discussed management of hot flashes including resumption of gabapentin  or consideration for hormone replacement.  Her skin rash is not debilitating but only in sun exposed areas PHYSICAL EXAMINATION: ECOG PERFORMANCE STATUS: 0 - Asymptomatic  Vitals:   10/19/23 1306  BP: 120/68  Pulse: (!) 58  Resp: 18  Temp: (!) 97.5 F (36.4 C)  SpO2: 100%   Filed Weights   10/19/23 1306  Weight: 141 lb 8 oz (64.2 kg)    Relevant data reviewed during this visit included CBC, CMP and TSH

## 2023-10-20 NOTE — Assessment & Plan Note (Addendum)
 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

## 2023-10-31 ENCOUNTER — Inpatient Hospital Stay (HOSPITAL_BASED_OUTPATIENT_CLINIC_OR_DEPARTMENT_OTHER)
Admission: EM | Admit: 2023-10-31 | Discharge: 2023-11-02 | DRG: 638 | Disposition: A | Discharge status: Home / Self Care | Attending: Family Medicine | Admitting: Family Medicine

## 2023-10-31 ENCOUNTER — Encounter (HOSPITAL_BASED_OUTPATIENT_CLINIC_OR_DEPARTMENT_OTHER): Payer: Self-pay

## 2023-10-31 ENCOUNTER — Emergency Department (HOSPITAL_BASED_OUTPATIENT_CLINIC_OR_DEPARTMENT_OTHER)

## 2023-10-31 ENCOUNTER — Other Ambulatory Visit: Payer: Self-pay

## 2023-10-31 DIAGNOSIS — Z882 Allergy status to sulfonamides status: Secondary | ICD-10-CM

## 2023-10-31 DIAGNOSIS — C799 Secondary malignant neoplasm of unspecified site: Secondary | ICD-10-CM | POA: Diagnosis present

## 2023-10-31 DIAGNOSIS — Z794 Long term (current) use of insulin: Secondary | ICD-10-CM

## 2023-10-31 DIAGNOSIS — Z8249 Family history of ischemic heart disease and other diseases of the circulatory system: Secondary | ICD-10-CM

## 2023-10-31 DIAGNOSIS — Z8049 Family history of malignant neoplasm of other genital organs: Secondary | ICD-10-CM

## 2023-10-31 DIAGNOSIS — E1310 Other specified diabetes mellitus with ketoacidosis without coma: Principal | ICD-10-CM

## 2023-10-31 DIAGNOSIS — Z9221 Personal history of antineoplastic chemotherapy: Secondary | ICD-10-CM

## 2023-10-31 DIAGNOSIS — D630 Anemia in neoplastic disease: Secondary | ICD-10-CM | POA: Diagnosis present

## 2023-10-31 DIAGNOSIS — Z86718 Personal history of other venous thrombosis and embolism: Secondary | ICD-10-CM

## 2023-10-31 DIAGNOSIS — E0910 Drug or chemical induced diabetes mellitus with ketoacidosis without coma: Secondary | ICD-10-CM

## 2023-10-31 DIAGNOSIS — E063 Autoimmune thyroiditis: Secondary | ICD-10-CM | POA: Diagnosis present

## 2023-10-31 DIAGNOSIS — R112 Nausea with vomiting, unspecified: Secondary | ICD-10-CM | POA: Diagnosis present

## 2023-10-31 DIAGNOSIS — Z833 Family history of diabetes mellitus: Secondary | ICD-10-CM

## 2023-10-31 DIAGNOSIS — E1010 Type 1 diabetes mellitus with ketoacidosis without coma: Principal | ICD-10-CM | POA: Diagnosis present

## 2023-10-31 DIAGNOSIS — Z8542 Personal history of malignant neoplasm of other parts of uterus: Secondary | ICD-10-CM

## 2023-10-31 DIAGNOSIS — Z79890 Hormone replacement therapy (postmenopausal): Secondary | ICD-10-CM

## 2023-10-31 DIAGNOSIS — K529 Noninfective gastroenteritis and colitis, unspecified: Secondary | ICD-10-CM | POA: Diagnosis present

## 2023-10-31 DIAGNOSIS — Z96642 Presence of left artificial hip joint: Secondary | ICD-10-CM | POA: Diagnosis present

## 2023-10-31 DIAGNOSIS — Z7982 Long term (current) use of aspirin: Secondary | ICD-10-CM

## 2023-10-31 DIAGNOSIS — E1110 Type 2 diabetes mellitus with ketoacidosis without coma: Secondary | ICD-10-CM | POA: Diagnosis present

## 2023-10-31 DIAGNOSIS — Z79899 Other long term (current) drug therapy: Secondary | ICD-10-CM

## 2023-10-31 DIAGNOSIS — Z85828 Personal history of other malignant neoplasm of skin: Secondary | ICD-10-CM

## 2023-10-31 DIAGNOSIS — E876 Hypokalemia: Secondary | ICD-10-CM | POA: Diagnosis present

## 2023-10-31 DIAGNOSIS — Z801 Family history of malignant neoplasm of trachea, bronchus and lung: Secondary | ICD-10-CM

## 2023-10-31 DIAGNOSIS — C55 Malignant neoplasm of uterus, part unspecified: Secondary | ICD-10-CM | POA: Diagnosis present

## 2023-10-31 DIAGNOSIS — Z8051 Family history of malignant neoplasm of kidney: Secondary | ICD-10-CM

## 2023-10-31 LAB — I-STAT VENOUS BLOOD GAS, ED
Acid-base deficit: 14 mmol/L — ABNORMAL HIGH (ref 0.0–2.0)
Bicarbonate: 11.6 mmol/L — ABNORMAL LOW (ref 20.0–28.0)
Calcium, Ion: 1.17 mmol/L (ref 1.15–1.40)
HCT: 34 % — ABNORMAL LOW (ref 36.0–46.0)
Hemoglobin: 11.6 g/dL — ABNORMAL LOW (ref 12.0–15.0)
O2 Saturation: 95 %
Potassium: 4.4 mmol/L (ref 3.5–5.1)
Sodium: 132 mmol/L — ABNORMAL LOW (ref 135–145)
TCO2: 12 mmol/L — ABNORMAL LOW (ref 22–32)
pCO2, Ven: 26.8 mmHg — ABNORMAL LOW (ref 44–60)
pH, Ven: 7.242 — ABNORMAL LOW (ref 7.25–7.43)
pO2, Ven: 88 mmHg — ABNORMAL HIGH (ref 32–45)

## 2023-10-31 LAB — CBC WITH DIFFERENTIAL/PLATELET
Abs Immature Granulocytes: 0.03 10*3/uL (ref 0.00–0.07)
Basophils Absolute: 0 10*3/uL (ref 0.0–0.1)
Basophils Relative: 1 %
Eosinophils Absolute: 0 10*3/uL (ref 0.0–0.5)
Eosinophils Relative: 0 %
HCT: 34.6 % — ABNORMAL LOW (ref 36.0–46.0)
Hemoglobin: 12.2 g/dL (ref 12.0–15.0)
Immature Granulocytes: 0 %
Lymphocytes Relative: 10 %
Lymphs Abs: 0.7 10*3/uL (ref 0.7–4.0)
MCH: 30.8 pg (ref 26.0–34.0)
MCHC: 35.3 g/dL (ref 30.0–36.0)
MCV: 87.4 fL (ref 80.0–100.0)
Monocytes Absolute: 0.2 10*3/uL (ref 0.1–1.0)
Monocytes Relative: 2 %
Neutro Abs: 6.2 10*3/uL (ref 1.7–7.7)
Neutrophils Relative %: 87 %
Platelets: 242 10*3/uL (ref 150–400)
RBC: 3.96 MIL/uL (ref 3.87–5.11)
RDW: 12.6 % (ref 11.5–15.5)
WBC: 7.2 10*3/uL (ref 4.0–10.5)
nRBC: 0 % (ref 0.0–0.2)

## 2023-10-31 LAB — BASIC METABOLIC PANEL WITH GFR
Anion gap: 26 — ABNORMAL HIGH (ref 5–15)
Anion gap: 5 (ref 5–15)
BUN: 16 mg/dL (ref 8–23)
BUN: 14 mg/dL (ref 8–23)
CO2: 9 mmol/L — ABNORMAL LOW (ref 22–32)
CO2: 22 mmol/L (ref 22–32)
Calcium: 8.6 mg/dL — ABNORMAL LOW (ref 8.9–10.3)
Calcium: 8.7 mg/dL — ABNORMAL LOW (ref 8.9–10.3)
Chloride: 100 mmol/L (ref 98–111)
Chloride: 106 mmol/L (ref 98–111)
Creatinine, Ser: 0.94 mg/dL (ref 0.44–1.00)
Creatinine, Ser: 0.64 mg/dL (ref 0.44–1.00)
GFR, Estimated: >60 mL/min (ref >60)
GFR, Estimated: >60 mL/min
Glucose, Bld: 281 mg/dL — ABNORMAL HIGH (ref 70–99)
Glucose, Bld: 145 mg/dL — ABNORMAL HIGH (ref 70–99)
Potassium: 4.1 mmol/L (ref 3.5–5.1)
Potassium: 3.4 mmol/L — ABNORMAL LOW (ref 3.5–5.1)
Sodium: 134 mmol/L — ABNORMAL LOW (ref 135–145)
Sodium: 133 mmol/L — ABNORMAL LOW (ref 135–145)

## 2023-10-31 LAB — CBG MONITORING, ED
Glucose-Capillary: 365 mg/dL — ABNORMAL HIGH (ref 70–99)
Glucose-Capillary: 294 mg/dL — ABNORMAL HIGH (ref 70–99)
Glucose-Capillary: 239 mg/dL — ABNORMAL HIGH (ref 70–99)

## 2023-10-31 LAB — URINALYSIS, ROUTINE W REFLEX MICROSCOPIC
Bilirubin Urine: NEGATIVE
Glucose, UA: >=500 mg/dL — AB
Ketones, ur: >=80 mg/dL — AB
Leukocytes,Ua: NEGATIVE
Nitrite: NEGATIVE
Protein, ur: NEGATIVE mg/dL
Specific Gravity, Urine: >=1.03 (ref 1.005–1.030)
pH: 5.5 (ref 5.0–8.0)

## 2023-10-31 LAB — URINALYSIS, MICROSCOPIC (REFLEX)

## 2023-10-31 LAB — COMPREHENSIVE METABOLIC PANEL WITH GFR
ALT: 15 U/L (ref 0–44)
ALT: 13 U/L (ref 0–44)
AST: 17 U/L (ref 15–41)
AST: 16 U/L (ref 15–41)
Albumin: 4.4 g/dL (ref 3.5–5.0)
Albumin: 4.1 g/dL (ref 3.5–5.0)
Alkaline Phosphatase: 127 U/L — ABNORMAL HIGH (ref 38–126)
Alkaline Phosphatase: 117 U/L (ref 38–126)
Anion gap: 31 — ABNORMAL HIGH (ref 5–15)
Anion gap: 28 — ABNORMAL HIGH (ref 5–15)
BUN: 18 mg/dL (ref 8–23)
BUN: 17 mg/dL (ref 8–23)
CO2: 11 mmol/L — ABNORMAL LOW (ref 22–32)
CO2: 10 mmol/L — ABNORMAL LOW (ref 22–32)
Calcium: 9.8 mg/dL (ref 8.9–10.3)
Calcium: 9.1 mg/dL (ref 8.9–10.3)
Chloride: 91 mmol/L — ABNORMAL LOW (ref 98–111)
Chloride: 95 mmol/L — ABNORMAL LOW (ref 98–111)
Creatinine, Ser: 1.08 mg/dL — ABNORMAL HIGH (ref 0.44–1.00)
Creatinine, Ser: 0.95 mg/dL (ref 0.44–1.00)
GFR, Estimated: 55 mL/min — ABNORMAL LOW (ref >60)
GFR, Estimated: >60 mL/min (ref >60)
Glucose, Bld: 425 mg/dL — ABNORMAL HIGH (ref 70–99)
Glucose, Bld: 368 mg/dL — ABNORMAL HIGH (ref 70–99)
Potassium: 4.3 mmol/L (ref 3.5–5.1)
Potassium: 4.5 mmol/L (ref 3.5–5.1)
Sodium: 133 mmol/L — ABNORMAL LOW (ref 135–145)
Sodium: 133 mmol/L — ABNORMAL LOW (ref 135–145)
Total Bilirubin: 0.7 mg/dL (ref 0.0–1.2)
Total Bilirubin: 0.5 mg/dL (ref 0.0–1.2)
Total Protein: 7.4 g/dL (ref 6.5–8.1)
Total Protein: 6.8 g/dL (ref 6.5–8.1)

## 2023-10-31 LAB — GLUCOSE, CAPILLARY
Glucose-Capillary: 189 mg/dL — ABNORMAL HIGH (ref 70–99)
Glucose-Capillary: 190 mg/dL — ABNORMAL HIGH (ref 70–99)
Glucose-Capillary: 164 mg/dL — ABNORMAL HIGH (ref 70–99)
Glucose-Capillary: 149 mg/dL — ABNORMAL HIGH (ref 70–99)
Glucose-Capillary: 133 mg/dL — ABNORMAL HIGH (ref 70–99)
Glucose-Capillary: 135 mg/dL — ABNORMAL HIGH (ref 70–99)

## 2023-10-31 LAB — BETA-HYDROXYBUTYRIC ACID
Beta-Hydroxybutyric Acid: 7.72 mmol/L — ABNORMAL HIGH (ref 0.05–0.27)
Beta-Hydroxybutyric Acid: 0.39 mmol/L — ABNORMAL HIGH (ref 0.05–0.27)

## 2023-10-31 LAB — HEMOGLOBIN A1C
Hgb A1c MFr Bld: 7.5 % — ABNORMAL HIGH (ref 4.8–5.6)
Mean Plasma Glucose: 168.55 mg/dL

## 2023-10-31 LAB — LIPASE, BLOOD: Lipase: 19 U/L (ref 11–51)

## 2023-10-31 LAB — TSH: TSH: 0.78 u[IU]/mL (ref 0.350–4.500)

## 2023-10-31 LAB — MRSA NEXT GEN BY PCR, NASAL: MRSA by PCR Next Gen: NOT DETECTED

## 2023-10-31 LAB — LACTIC ACID, PLASMA: Lactic Acid, Venous: 1.8 mmol/L (ref 0.5–1.9)

## 2023-10-31 MED ORDER — DEXTROSE IN LACTATED RINGERS 5 % IV SOLN: INTRAVENOUS | Status: DC

## 2023-10-31 MED ORDER — POTASSIUM CHLORIDE 10 MEQ/100ML IV SOLN
10.0000 meq | INTRAVENOUS | Status: AC
  Administered 2023-10-31 (×2): 10 meq via INTRAVENOUS
  Filled 2023-10-31: qty 100

## 2023-10-31 MED ORDER — IOHEXOL 300 MG/ML  SOLN
75.0000 mL | Freq: Once | INTRAMUSCULAR | Status: AC | PRN
  Administered 2023-10-31: 75 mL via INTRAVENOUS

## 2023-10-31 MED ORDER — SODIUM CHLORIDE 0.9 % IV BOLUS
1000.0000 mL | Freq: Once | INTRAVENOUS | Status: AC
  Administered 2023-10-31: 1000 mL via INTRAVENOUS

## 2023-10-31 MED ORDER — LACTATED RINGERS IV SOLN: INTRAVENOUS | Status: DC

## 2023-10-31 MED ORDER — LEVOTHYROXINE SODIUM 100 MCG PO TABS
100.0000 ug | ORAL_TABLET | Freq: Every day | ORAL | Status: DC
  Administered 2023-11-01 – 2023-11-02 (×2): 100 ug via ORAL
  Filled 2023-10-31 (×2): qty 1

## 2023-10-31 MED ORDER — ORAL CARE MOUTH RINSE: 15.0000 mL | OROMUCOSAL | Status: DC | PRN

## 2023-10-31 MED ORDER — ENOXAPARIN SODIUM 40 MG/0.4ML IJ SOSY
40.0000 mg | PREFILLED_SYRINGE | INTRAMUSCULAR | Status: DC
  Administered 2023-10-31 – 2023-11-01 (×2): 40 mg via SUBCUTANEOUS
  Filled 2023-10-31 (×2): qty 0.4

## 2023-10-31 MED ORDER — ONDANSETRON HCL 4 MG/2ML IJ SOLN
4.0000 mg | Freq: Once | INTRAMUSCULAR | Status: AC
  Administered 2023-10-31: 4 mg via INTRAVENOUS
  Filled 2023-10-31: qty 2

## 2023-10-31 MED ORDER — DEXTROSE 50 % IV SOLN: 0.0000 mL | INTRAVENOUS | Status: DC | PRN

## 2023-10-31 MED ORDER — ACETAMINOPHEN 325 MG PO TABS: 650.0000 mg | ORAL_TABLET | Freq: Four times a day (QID) | ORAL | Status: DC | PRN

## 2023-10-31 MED ORDER — PIPERACILLIN-TAZOBACTAM 3.375 G IVPB 30 MIN
3.3750 g | Freq: Once | INTRAVENOUS | Status: AC
  Administered 2023-10-31: 3.375 g via INTRAVENOUS
  Filled 2023-10-31: qty 50

## 2023-10-31 MED ORDER — LACTATED RINGERS IV BOLUS
20.0000 mL/kg | Freq: Once | INTRAVENOUS | Status: AC
Start: 2023-10-31 — End: 2023-10-31
  Administered 2023-10-31: 1000 mL via INTRAVENOUS

## 2023-10-31 MED ORDER — INSULIN REGULAR(HUMAN) IN NACL 100-0.9 UT/100ML-% IV SOLN
INTRAVENOUS | Status: DC
  Administered 2023-10-31: 6.5 [IU]/h via INTRAVENOUS
  Filled 2023-10-31: qty 100

## 2023-10-31 MED ORDER — FENTANYL CITRATE PF 50 MCG/ML IJ SOSY: 12.5000 ug | PREFILLED_SYRINGE | INTRAMUSCULAR | Status: DC | PRN

## 2023-10-31 MED ORDER — POTASSIUM CHLORIDE 10 MEQ/100ML IV SOLN
10.0000 meq | INTRAVENOUS | Status: AC
  Administered 2023-10-31 (×2): 10 meq via INTRAVENOUS
  Filled 2023-10-31 (×2): qty 100

## 2023-10-31 MED ORDER — ONDANSETRON HCL 4 MG PO TABS: 4.0000 mg | ORAL_TABLET | Freq: Four times a day (QID) | ORAL | Status: DC | PRN

## 2023-10-31 MED ORDER — ASPIRIN 81 MG PO TBEC
81.0000 mg | DELAYED_RELEASE_TABLET | Freq: Every day | ORAL | Status: DC
  Administered 2023-11-01 – 2023-11-02 (×2): 81 mg via ORAL
  Filled 2023-10-31 (×2): qty 1

## 2023-10-31 MED ORDER — ACETAMINOPHEN 650 MG RE SUPP: 650.0000 mg | Freq: Four times a day (QID) | RECTAL | Status: DC | PRN

## 2023-10-31 MED ORDER — INSULIN REGULAR(HUMAN) IN NACL 100-0.9 UT/100ML-% IV SOLN: INTRAVENOUS | Status: DC

## 2023-10-31 MED ORDER — ONDANSETRON HCL 4 MG/2ML IJ SOLN: 4.0000 mg | Freq: Four times a day (QID) | INTRAMUSCULAR | Status: DC | PRN

## 2023-10-31 MED ORDER — CHLORHEXIDINE GLUCONATE CLOTH 2 % EX PADS
6.0000 | MEDICATED_PAD | Freq: Every day | CUTANEOUS | Status: DC
Start: 2023-10-31 — End: 2023-11-02
  Administered 2023-10-31 – 2023-11-01 (×2): 6 via TOPICAL

## 2023-10-31 MED ORDER — POLYETHYLENE GLYCOL 3350 17 G PO PACK: 17.0000 g | PACK | Freq: Every day | ORAL | Status: DC | PRN

## 2023-10-31 NOTE — Assessment & Plan Note (Signed)
 Status post tumor debulking as well as neoadjuvant and post surgical chemotherapy.  She is currently on immune modulator Keytruda  which she has been on since last year and is supposed to continue until November. She follows with Dr. Glynn Lasso will be made aware of her admission.

## 2023-10-31 NOTE — ED Triage Notes (Signed)
 Pt is a cancer pt. Last treatment on 04/22. Pt has been having diarrhea, nausea, and vomiting. Pt states that she is losing 2 pounds a day. Pt has increase is weakness and is feeling dehydrated. Pt is having abd pain as well. Also a  few episodes of shortness of breath.

## 2023-10-31 NOTE — Progress Notes (Signed)
 Plan of Care Note for accepted transfer  Patient: Lisa  WYNONA Zavala    ZOX:[id number]  DOA: 10/31/2023     Facility requesting transfer: [facility] Requesting Provider: Dr. Colonel Dears Reason for transfer: DKA Facility course:   73 year old female with past medical history of poorly differentiated metastatic carcinoma, thought to be uterine status post tumor debulking, laparotomy for omentectomy and adjuvant chemotherapy in 2024 following with Dr. Marton Sleeper and Daisey Dryer presenting to [facility] emergency department with complaints of nausea vomiting and diarrhea for several days.  Upon evaluation in the emergency department CT imaging of the abdomen and pelvis revealed pericolonic inflammatory stranding surrounding the hepatic flexure of the colon possibly secondary to infectious or inflammatory colitis.  Patient was additionally found to be in diabetic ketoacidosis while having no known history of diabetes mellitus.  Patient was initiated on intravenous volume resuscitation, insulin infusion and Zosyn.  Hospitalist group has now been called for consideration of acceptance to [facility].  Plan of care: The patient is accepted for admission to [facility] unit, at [facility].    Author: True Fuss, MD  10/31/2023  Check [email or web address] for on-call coverage.  Nursing staff, Please call [facility] Admits & Consults System-Wide number on Amion as soon as patient's arrival, so appropriate admitting provider can evaluate the pt.

## 2023-10-31 NOTE — H&P (Signed)
 History and Physical    Patient: [name]  [name] ZOX:[id number] DOB: Oct 31, 1950 DOA: 10/31/2023 DOS: the patient was seen and examined on 10/31/2023 PCP: Rada Buerger, MD  Patient coming from: Home  Chief Complaint:  Chief Complaint  Patient presents with   Weakness   HPI: [name] is a 73 y.o. 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  She developed dry mouth since January.  She has not noted true increase in thirst.  She did have GI upset as well as very watery diarrhea over the last 1-1/2 to 2 weeks.  The patient reports a 10 pound weight loss in the last 2 weeks as well.  She has had nausea and vomiting which developed this morning only where she threw up bile.  Patient denies significant fever, blood in her stool, respiratory illness.  She was seen at the [facility] where she was noted to be in DKA with a bicarb of 9 and a beta hydroxybutyrate of 7.72.  Her initial CBG was 425.  She was given IV fluid hydration as well as insulin drip and Zofran  which helped her nausea significantly. Review of Systems: As mentioned in the history of present illness. All other systems reviewed and are negative. 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   Top of Left Hand(Tx p Bx)   Past Surgical History:  Procedure Laterality Date   CARPAL  TUNNEL RELEASE     BILAT     CATARACT EXTRACTION     BILAT    ECTOPIC PREGNANCY SURGERY     in the setting of prior BTL, lapartomy   EYE SURGERY     IR IMAGING GUIDED PORT INSERTION  12/02/2022   KNEE CARTILAGE SURGERY     RIGHT    (MENISCUS)   LAPAROTOMY N/A 02/16/2023   Procedure: MINI LAPAROTOMY;  Surgeon: Derrel Flies, MD;  Location: [facility];  Service: Gynecology;  Laterality: N/A;   LYMPH NODE DISSECTION Right 02/16/2023   Procedure: RIGHT PELVIC LYMPH NODE DISSECTION;  Surgeon: Derrel Flies, MD;  Location: [facility];  Service: Gynecology;  Laterality: Right;   MOHS SURGERY     left nose/inner eye for basal cell   TONSILLECTOMY     TOTAL HIP ARTHROPLASTY Left 12/19/2015   TOTAL HIP ARTHROPLASTY Left 12/19/2015   Procedure: LEFT TOTAL HIP ARTHROPLASTY;  Surgeon: Shirlee Dotter, MD;  Location: [facility];  Service: Orthopedics;  Laterality: Left;   TRIGGER FINGER RELEASE     TUBAL LIGATION     Social History:  reports that she has never smoked. She has never used smokeless tobacco. She reports current alcohol  use. She reports that she does not use drugs.  Allergies  Allergen Reactions   Sulfa Antibiotics Swelling  Family History  Problem Relation Age of Onset   Uterine cancer Mother 55   Heart attack Mother    Hypertension Mother    Kidney cancer Father 71   Lung cancer Father    Diabetes Sister    Thyroid  disease Neg Hx    Colon cancer Neg Hx    Breast cancer Neg Hx    Ovarian cancer Neg Hx    Endometrial cancer Neg Hx    Pancreatic cancer Neg Hx    Prostate cancer Neg Hx     Prior to Admission medications   Medication Sig Start Date End Date Taking? Authorizing Provider  acetaminophen  (TYLENOL ) 500 MG tablet Take 1,000 mg by mouth every 6 (six) hours as needed for moderate pain.    [provider]  aspirin EC 81 MG tablet Take 81 mg by mouth daily. Swallow whole.    [provider]  calcium carbonate (TUMS) 500 MG chewable tablet Chew 1 tablet  by mouth daily.    [provider]  Carboxymethylcellulose Sodium (THERATEARS OP) Place 1 drop into both eyes 3 (three) times daily.    [provider]  Cholecalciferol (VITAMIN D ) 50 MCG (2000 UT) tablet Take 2,000 Units by mouth daily.    [provider]  diclofenac  sodium (VOLTAREN ) 1 % GEL Apply 2-4 g topically 4 (four) times daily. Patient taking differently: Apply 2-4 g topically 4 (four) times daily as needed (pain). 04/19/18   Petrarca, Brian D, PA-C  levothyroxine  (SYNTHROID ) 100 MCG tablet Take 1 tablet (100 mcg total) by mouth daily. 06/02/23   Almeda Jacobs, MD  lidocaine -prilocaine  (EMLA ) cream Apply 1 Application topically as needed. 06/01/23   Almeda Jacobs, MD  melatonin 5 MG TABS Take 5 mg by mouth at bedtime as needed (sleep).    [provider]  Multiple Vitamin (MULTIVITAMIN) tablet Take 1 tablet by mouth daily.    [provider]    Physical Exam: Vitals:   10/31/23 1652 10/31/23 1800 10/31/23 1900 10/31/23 2000  BP: (!) 113/58 (!) 132/49 (!) 138/55   Pulse: 73 66 67   Resp: (!) 22 19 18    Temp: 98.7 F (37.1 C) 98.3 F (36.8 C) 99 F (37.2 C) 99.1 F (37.3 C)  TempSrc: Oral Oral Oral Oral  SpO2: 99% 100% 100%   Weight:  60.3 kg    Height:  5\' 3"  (1.6 m)     Physical Examination: General appearance - alert, well appearing, and in no distress Neck - supple, no significant adenopathy Chest - clear to auscultation, no wheezes, rales or rhonchi, symmetric air entry Heart - normal rate, regular rhythm, normal S1, S2, no murmurs, rubs, clicks or gallops Abdomen - soft, nontender, nondistended, no masses or organomegaly Extremities - peripheral pulses normal, no pedal edema, no clubbing or cyanosis Skin - normal coloration and turgor, no rashes, no suspicious skin lesions noted  Data Reviewed: Results for orders placed or performed during the hospital encounter of 10/31/23 (from the past 24 hours)  CBC with Differential      Status: Abnormal   Collection Time: 10/31/23 11:06 AM  Result Value Ref Range   WBC 7.2 4.0 - 10.5 K/uL   RBC 3.96 3.87 - 5.11 MIL/uL   Hemoglobin 12.2 12.0 - 15.0 g/dL   HCT 13.0 (L) 86.5 - 78.4 %   MCV 87.4 80.0 - 100.0 fL   MCH 30.8 26.0 - 34.0 pg   MCHC 35.3 30.0 - 36.0 g/dL   RDW 69.6 29.5 -  15.5 %   Platelets 242 150 - 400 K/uL   nRBC 0.0 0.0 - 0.2 %   Neutrophils Relative % 87 %   Neutro Abs 6.2 1.7 - 7.7 K/uL   Lymphocytes Relative 10 %   Lymphs Abs 0.7 0.7 - 4.0 K/uL   Monocytes Relative 2 %   Monocytes Absolute 0.2 0.1 - 1.0 K/uL   Eosinophils Relative 0 %   Eosinophils Absolute 0.0 0.0 - 0.5 K/uL   Basophils Relative 1 %   Basophils Absolute 0.0 0.0 - 0.1 K/uL   Immature Granulocytes 0 %   Abs Immature Granulocytes 0.03 0.00 - 0.07 K/uL  Comprehensive metabolic panel     Status: Abnormal   Collection Time: 10/31/23 11:06 AM  Result Value Ref Range   Sodium 133 (L) 135 - 145 mmol/L   Potassium 4.3 3.5 - 5.1 mmol/L   Chloride 91 (L) 98 - 111 mmol/L   CO2 11 (L) 22 - 32 mmol/L   Glucose, Bld 425 (H) 70 - 99 mg/dL   BUN 18 8 - 23 mg/dL   Creatinine, Ser 1.61 (H) 0.44 - 1.00 mg/dL   Calcium 9.8 8.9 - 09.6 mg/dL   Total Protein 7.4 6.5 - 8.1 g/dL   Albumin 4.4 3.5 - 5.0 g/dL   AST 17 15 - 41 U/L   ALT 15 0 - 44 U/L   Alkaline Phosphatase 127 (H) 38 - 126 U/L   Total Bilirubin 0.7 0.0 - 1.2 mg/dL   GFR, Estimated 55 (L) >60 mL/min   Anion gap 31 (H) 5 - 15  Lipase, blood     Status: None   Collection Time: 10/31/23 11:06 AM  Result Value Ref Range   Lipase 19 11 - 51 U/L  Urinalysis, Routine w reflex microscopic -Urine, Clean Catch     Status: Abnormal   Collection Time: 10/31/23 12:40 PM  Result Value Ref Range   Color, Urine YELLOW YELLOW   APPearance CLEAR CLEAR   Specific Gravity, Urine >=1.030 1.005 - 1.030   pH 5.5 5.0 - 8.0   Glucose, UA >=500 (A) NEGATIVE mg/dL   Hgb urine dipstick TRACE (A) NEGATIVE   Bilirubin Urine NEGATIVE NEGATIVE   Ketones,  ur >=80 (A) NEGATIVE mg/dL   Protein, ur NEGATIVE NEGATIVE mg/dL   Nitrite NEGATIVE NEGATIVE   Leukocytes,Ua NEGATIVE NEGATIVE  Urinalysis, Microscopic (reflex)     Status: Abnormal   Collection Time: 10/31/23 12:40 PM  Result Value Ref Range   RBC / HPF 0-5 0 - 5 RBC/hpf   WBC, UA 0-5 0 - 5 WBC/hpf   Bacteria, UA RARE (A) NONE SEEN   Squamous Epithelial / HPF 0-5 0 - 5 /HPF  Comprehensive metabolic panel     Status: Abnormal   Collection Time: 10/31/23  1:32 PM  Result Value Ref Range   Sodium 133 (L) 135 - 145 mmol/L   Potassium 4.5 3.5 - 5.1 mmol/L   Chloride 95 (L) 98 - 111 mmol/L   CO2 10 (L) 22 - 32 mmol/L   Glucose, Bld 368 (H) 70 - 99 mg/dL   BUN 17 8 - 23 mg/dL   Creatinine, Ser 0.45 0.44 - 1.00 mg/dL   Calcium 9.1 8.9 - 40.9 mg/dL   Total Protein 6.8 6.5 - 8.1 g/dL   Albumin 4.1 3.5 - 5.0 g/dL   AST 16 15 - 41 U/L   ALT 13 0 - 44 U/L   Alkaline Phosphatase 117 38 - 126 U/L  Total Bilirubin 0.5 0.0 - 1.2 mg/dL   GFR, Estimated >60 >45 mL/min   Anion gap 28 (H) 5 - 15  Lactic acid, plasma     Status: None   Collection Time: 10/31/23  1:32 PM  Result Value Ref Range   Lactic Acid, Venous 1.8 0.5 - 1.9 mmol/L  Beta-hydroxybutyric acid     Status: Abnormal   Collection Time: 10/31/23  1:32 PM  Result Value Ref Range   Beta-Hydroxybutyric Acid 7.72 (H) 0.05 - 0.27 mmol/L  I-Stat venous blood gas, ED     Status: Abnormal   Collection Time: 10/31/23  1:41 PM  Result Value Ref Range   pH, Ven 7.242 (L) 7.25 - 7.43   pCO2, Ven 26.8 (L) 44 - 60 mmHg   pO2, Ven 88 (H) 32 - 45 mmHg   Bicarbonate 11.6 (L) 20.0 - 28.0 mmol/L   TCO2 12 (L) 22 - 32 mmol/L   O2 Saturation 95 %   Acid-base deficit 14.0 (H) 0.0 - 2.0 mmol/L   Sodium 132 (L) 135 - 145 mmol/L   Potassium 4.4 3.5 - 5.1 mmol/L   Calcium, Ion 1.17 1.15 - 1.40 mmol/L   HCT 34.0 (L) 36.0 - 46.0 %   Hemoglobin 11.6 (L) 12.0 - 15.0 g/dL   Collection site IV start    Drawn by Nurse    Sample type VENOUS   CBG  monitoring, ED     Status: Abnormal   Collection Time: 10/31/23  2:16 PM  Result Value Ref Range   Glucose-Capillary 365 (H) 70 - 99 mg/dL  Basic metabolic panel     Status: Abnormal   Collection Time: 10/31/23  3:07 PM  Result Value Ref Range   Sodium 134 (L) 135 - 145 mmol/L   Potassium 4.1 3.5 - 5.1 mmol/L   Chloride 100 98 - 111 mmol/L   CO2 9 (L) 22 - 32 mmol/L   Glucose, Bld 281 (H) 70 - 99 mg/dL   BUN 16 8 - 23 mg/dL   Creatinine, Ser 4.09 0.44 - 1.00 mg/dL   Calcium 8.6 (L) 8.9 - 10.3 mg/dL   GFR, Estimated >81 >19 mL/min   Anion gap 26 (H) 5 - 15  CBG monitoring, ED     Status: Abnormal   Collection Time: 10/31/23  3:15 PM  Result Value Ref Range   Glucose-Capillary 294 (H) 70 - 99 mg/dL  CBG monitoring, ED     Status: Abnormal   Collection Time: 10/31/23  4:21 PM  Result Value Ref Range   Glucose-Capillary 239 (H) 70 - 99 mg/dL  Glucose, capillary     Status: Abnormal   Collection Time: 10/31/23  5:59 PM  Result Value Ref Range   Glucose-Capillary 189 (H) 70 - 99 mg/dL  MRSA Next Gen by PCR, Nasal     Status: None   Collection Time: 10/31/23  6:11 PM   Specimen: Nasal Mucosa; Nasal Swab  Result Value Ref Range   MRSA by PCR Next Gen NOT DETECTED NOT DETECTED  Glucose, capillary     Status: Abnormal   Collection Time: 10/31/23  7:00 PM  Result Value Ref Range   Glucose-Capillary 190 (H) 70 - 99 mg/dL  Glucose, capillary     Status: Abnormal   Collection Time: 10/31/23  8:03 PM  Result Value Ref Range   Glucose-Capillary 164 (H) 70 - 99 mg/dL   Comment 1 Notify RN    Comment 2 Document in Chart    CT  ABDOMEN PELVIS W CONTRAST Result Date: 10/31/2023 CLINICAL DATA:  Acute nonlocalized abdominal pain EXAM: CT ABDOMEN AND PELVIS WITH CONTRAST TECHNIQUE: Multidetector CT imaging of the abdomen and pelvis was performed using the standard protocol following bolus administration of intravenous contrast. RADIATION DOSE REDUCTION: This exam was performed according to the  departmental dose-optimization program which includes automated exposure control, adjustment of the mA and/or kV according to patient size and/or use of iterative reconstruction technique. CONTRAST:  75mL OMNIPAQUE  IOHEXOL  300 MG/ML  SOLN COMPARISON:  None Available. FINDINGS: Lower chest: No acute abnormality. Hepatobiliary: No focal liver abnormality is seen. No gallstones, gallbladder wall thickening, or biliary dilatation. Pancreas: Unremarkable Spleen: Unremarkable Adrenals/Urinary Tract: The adrenal glands are unremarkable. The kidneys are normal. The bladder is partially obscured by streak artifact, however, the visualized portion is unremarkable. Stomach/Bowel: There is mild pericolonic inflammatory stranding surrounding the hepatic flexure of the colon best seen on image # 42/2, possibly to a mild underlying infectious or inflammatory colitis. There is no evidence of obstruction or perforation. The stomach, small bowel, and large bowel are otherwise unremarkable. Appendix normal. No free intraperitoneal gas or fluid. Vascular/Lymphatic: Aortic atherosclerosis. No enlarged abdominal or pelvic lymph nodes. Index right inguinal lymph node is stable measuring 9 mm in short axis diameter at axial image # 75/2. Reproductive: Status post hysterectomy. No adnexal masses. Other: No abdominal wall hernia or abnormality. No abdominopelvic ascites. Musculoskeletal: Left total hip arthroplasty has been performed. Osseous structures are age-appropriate. No acute bone abnormality. IMPRESSION: 1. Mild pericolonic inflammatory stranding surrounding the hepatic flexure of the colon possibly reflecting a mild infectious or inflammatory colitis. No evidence of obstruction or perforation. Aortic Atherosclerosis (ICD10-I70.0). Electronically Signed   By: Worthy Heads M.D.   On: 10/31/2023 13:19     Assessment and Plan: * DKA (diabetic ketoacidosis) (HCC) Admit to ICU IV fluid resuscitation Insulin gtt. Monitor serial  labs and convert to subcu insulin as soon as possible. Diabetes education This is a new diagnosis for this patient and given her DKA I am concerned this may be more of a type 1 diabetes picture.  She has a sister with type 1 diabetes. Has several insulin antibody tests and a C-peptide level pending. In a quick search it appears Keytruda  can certainly cause DKA and type 1 diabetes.  Will allow her oncologist to decide about next steps given this.  Colitis Found on CT scan near the hepatic flexure. Keytruda  has been implicated as a cause of colitis. Has no fever or elevated WBC.  Do not suspect infectious cause here.  Uterine cancer (HCC) Status post tumor debulking as well as neoadjuvant and post surgical chemotherapy.  She is currently on immune modulator Keytruda  which she has been on since last year and is supposed to continue until November. She follows with Dr. Glynn Lasso will be made aware of her admission.  Hypothyroidism due to Hashimoto's thyroiditis Check TSH Continue to thyroxine currently on 100 mcg daily however she has lost 10 pounds in the last week which might be affecting her dosing.      Advance Care Planning:   Code Status: Prior full  Consults: None  Family Communication: Husband at bedside  Severity of Illness: The appropriate patient status for this patient is INPATIENT. Inpatient status is judged to be reasonable and necessary in order to provide the required intensity of service to ensure the patient's safety. The patient's presenting symptoms, physical exam findings, and initial radiographic and laboratory data in the context of their  chronic comorbidities is felt to place them at high risk for further clinical deterioration. Furthermore, it is not anticipated that the patient will be medically stable for discharge from the hospital within 2 midnights of admission.   * I certify that at the point of admission it is my clinical judgment that the patient will  require inpatient hospital care spanning beyond 2 midnights from the point of admission due to high intensity of service, high risk for further deterioration and high frequency of surveillance required.*  Author: Granville Layer, MD 10/31/2023 8:23 PM  For on call review [email or web address].

## 2023-10-31 NOTE — Assessment & Plan Note (Addendum)
 Found on CT scan near the hepatic flexure. Keytruda  has been implicated as a cause of colitis. Has no fever or elevated WBC.  Do not suspect infectious cause here.

## 2023-10-31 NOTE — Assessment & Plan Note (Signed)
 Admit to ICU IV fluid resuscitation Insulin gtt. Monitor serial labs and convert to subcu insulin as soon as possible. Diabetes education This is a new diagnosis for this patient and given her DKA I am concerned this may be more of a type 1 diabetes picture.  She has a sister with type 1 diabetes. Has several insulin antibody tests and a C-peptide level pending. In a quick search it appears Keytruda  can certainly cause DKA and type 1 diabetes.  Will allow her oncologist to decide about next steps given this.

## 2023-10-31 NOTE — Assessment & Plan Note (Signed)
 Check TSH Continue to thyroxine currently on 100 mcg daily however she has lost 10 pounds in the last week which might be affecting her dosing.

## 2023-10-31 NOTE — ED Notes (Signed)
Pt ambulated to the bathroom to void again 

## 2023-10-31 NOTE — ED Notes (Signed)
 Patient transported to CT

## 2023-10-31 NOTE — ED Notes (Signed)
 Due to initiation on [employer], Willetta Harpin RN will take over care on this patient. Report given to same.

## 2023-10-31 NOTE — Progress Notes (Signed)
 ED Pharmacy Antibiotic Sign Off An antibiotic consult was received from an ED provider for zosyn per pharmacy dosing for intra-abdominal infection. A chart review was completed to assess appropriateness.   The following one time order(s) were placed:  Zosyn 3.375mg  x1   Further antibiotic and/or antibiotic pharmacy consults should be ordered by the admitting provider if indicated.   Thank you for allowing pharmacy to be a part of this patient's care.   Mamie Searles, PharmD, BCCCP  Clinical Pharmacist 10/31/23 1:58 PM

## 2023-10-31 NOTE — Hospital Course (Signed)
 Patient is a 73 year old with history of hypothyroidism and probable primary uterine cancer status post neoadjuvant chemo with debulking and currently on Keytruda  only.  She developed dry mouth since January.  She has not noted true increase in thirst.  She did have GI upset as well as very watery diarrhea over the last 1-1/2 to 2 weeks.  The patient reports a 10 pound weight loss in the last 2 weeks as well.  She has had nausea and vomiting which developed this morning only where she threw up bile.  Patient denies significant fever, blood in her stool, respiratory illness.  She was seen at the [facility] where she was noted to be in DKA with a bicarb of 9 and a beta hydroxybutyrate of 7.72.  Her initial CBG was 425.  She was given IV fluid hydration as well as insulin drip and Zofran  which helped her nausea significantly.

## 2023-10-31 NOTE — ED Provider Notes (Signed)
 [place] [facility] Provider Note   CSN: [id number] Arrival date & time: 10/31/23  1020     History  Chief Complaint  Patient presents with   Weakness    Lacey  D Shreeves is a 73 y.o. female.  Patient here with nausea vomiting diarrhea abdominal pain.  History of uterine cancer undergoing chemotherapy.  History of blood clots.  Nothing makes it worse or better.  Denies any fevers or chills.  Denies any black or bloody stool.  Last infusion about 2 weeks ago.  On immunotherapy.  Denies any chest pain shortness of breath cough sputum production.  She feels dehydrated.  No suspicious food intake or sick contacts.  Has had diarrhea for couple weeks but vomited today.  She said she ate well last night.  The history is provided by the patient.       Home Medications Prior to Admission medications   Medication Sig Start Date End Date Taking? Authorizing Provider  acetaminophen  (TYLENOL ) 500 MG tablet Take 1,000 mg by mouth every 6 (six) hours as needed for moderate pain.    [provider]  aspirin EC 81 MG tablet Take 81 mg by mouth daily. Swallow whole.    [provider]  calcium carbonate (TUMS) 500 MG chewable tablet Chew 1 tablet by mouth daily.    [provider]  Carboxymethylcellulose Sodium (THERATEARS OP) Place 1 drop into both eyes 3 (three) times daily.    [provider]  Cholecalciferol (VITAMIN D ) 50 MCG (2000 UT) tablet Take 2,000 Units by mouth daily.    [provider]  diclofenac  sodium (VOLTAREN ) 1 % GEL Apply 2-4 g topically 4 (four) times daily. Patient taking differently: Apply 2-4 g topically 4 (four) times daily as needed (pain). 04/19/18   Petrarca, Brian D, PA-C  levothyroxine  (SYNTHROID ) 100 MCG tablet Take 1 tablet (100 mcg total) by mouth daily. 06/02/23   Almeda Jacobs, MD  lidocaine -prilocaine  (EMLA ) cream Apply 1 Application topically as needed. 06/01/23   Almeda Jacobs, MD   melatonin 5 MG TABS Take 5 mg by mouth at bedtime as needed (sleep).    [provider]  Multiple Vitamin (MULTIVITAMIN) tablet Take 1 tablet by mouth daily.    [provider]      Allergies    Sulfa antibiotics    Review of Systems   Review of Systems  Physical Exam Updated Vital Signs BP 124/67   Pulse 74   Temp (!) 97.5 F (36.4 C) (Oral)   Resp (!) 21   Wt 56.9 kg   SpO2 100%   BMI 22.95 kg/m  Physical Exam Vitals and nursing note reviewed.  Constitutional:      General: She is not in acute distress.    Appearance: She is well-developed. She is not ill-appearing.  HENT:     Head: Normocephalic and atraumatic.     Mouth/Throat:     Mouth: Mucous membranes are moist.  Eyes:     Extraocular Movements: Extraocular movements intact.     Conjunctiva/sclera: Conjunctivae normal.     Pupils: Pupils are equal, round, and reactive to light.  Cardiovascular:     Rate and Rhythm: Normal rate and regular rhythm.     Pulses: Normal pulses.     Heart sounds: Normal heart sounds. No murmur heard. Pulmonary:     Effort: Pulmonary effort is normal. No respiratory distress.     Breath sounds: Normal breath sounds.  Abdominal:  Palpations: Abdomen is soft.     Tenderness: There is abdominal tenderness.  Musculoskeletal:        General: No swelling.     Cervical back: Normal range of motion and neck supple.  Skin:    General: Skin is warm and dry.     Capillary Refill: Capillary refill takes less than 2 seconds.  Neurological:     Mental Status: She is alert.  Psychiatric:        Mood and Affect: Mood normal.     ED Results / Procedures / Treatments   Labs (all labs ordered are listed, but only abnormal results are displayed) Labs Reviewed  CBC WITH DIFFERENTIAL/PLATELET - Abnormal; Notable for the following components:      Result Value   HCT 34.6 (*)    All other components within normal limits  COMPREHENSIVE METABOLIC PANEL WITH GFR -  Abnormal; Notable for the following components:   Sodium 133 (*)    Chloride 91 (*)    CO2 11 (*)    Glucose, Bld 425 (*)    Creatinine, Ser 1.08 (*)    Alkaline Phosphatase 127 (*)    GFR, Estimated 55 (*)    Anion gap 31 (*)    All other components within normal limits  URINALYSIS, ROUTINE W REFLEX MICROSCOPIC - Abnormal; Notable for the following components:   Glucose, UA >=500 (*)    Hgb urine dipstick TRACE (*)    Ketones, ur >=80 (*)    All other components within normal limits  URINALYSIS, MICROSCOPIC (REFLEX) - Abnormal; Notable for the following components:   Bacteria, UA RARE (*)    All other components within normal limits  I-STAT VENOUS BLOOD GAS, ED - Abnormal; Notable for the following components:   pH, Ven 7.242 (*)    pCO2, Ven 26.8 (*)    pO2, Ven 88 (*)    Bicarbonate 11.6 (*)    TCO2 12 (*)    Acid-base deficit 14.0 (*)    Sodium 132 (*)    HCT 34.0 (*)    Hemoglobin 11.6 (*)    All other components within normal limits  LIPASE, BLOOD  COMPREHENSIVE METABOLIC PANEL WITH GFR  LACTIC ACID, PLASMA  LACTIC ACID, PLASMA  BLOOD GAS, VENOUS  BETA-HYDROXYBUTYRIC ACID  CBG MONITORING, ED    EKG EKG Interpretation Date/Time:  Sunday Oct 31 2023 10:34:10 EDT Ventricular Rate:  78 PR Interval:  146 QRS Duration:  89 QT Interval:  384 QTC Calculation: 438 R Axis:   -36  Text Interpretation: Sinus rhythm Right atrial enlargement Confirmed by Lowery Rue [phone]) on 10/31/2023 12:12:10 PM  Radiology CT ABDOMEN PELVIS W CONTRAST Result Date: 10/31/2023 CLINICAL DATA:  Acute nonlocalized abdominal pain EXAM: CT ABDOMEN AND PELVIS WITH CONTRAST TECHNIQUE: Multidetector CT imaging of the abdomen and pelvis was performed using the standard protocol following bolus administration of intravenous contrast. RADIATION DOSE REDUCTION: This exam was performed according to the departmental dose-optimization program which includes automated exposure control, adjustment of the  mA and/or kV according to patient size and/or use of iterative reconstruction technique. CONTRAST:  75mL OMNIPAQUE  IOHEXOL  300 MG/ML  SOLN COMPARISON:  None Available. FINDINGS: Lower chest: No acute abnormality. Hepatobiliary: No focal liver abnormality is seen. No gallstones, gallbladder wall thickening, or biliary dilatation. Pancreas: Unremarkable Spleen: Unremarkable Adrenals/Urinary Tract: The adrenal glands are unremarkable. The kidneys are normal. The bladder is partially obscured by streak artifact, however, the visualized portion is unremarkable. Stomach/Bowel: There is mild pericolonic inflammatory  stranding surrounding the hepatic flexure of the colon best seen on image # 42/2, possibly to a mild underlying infectious or inflammatory colitis. There is no evidence of obstruction or perforation. The stomach, small bowel, and large bowel are otherwise unremarkable. Appendix normal. No free intraperitoneal gas or fluid. Vascular/Lymphatic: Aortic atherosclerosis. No enlarged abdominal or pelvic lymph nodes. Index right inguinal lymph node is stable measuring 9 mm in short axis diameter at axial image # 75/2. Reproductive: Status post hysterectomy. No adnexal masses. Other: No abdominal wall hernia or abnormality. No abdominopelvic ascites. Musculoskeletal: Left total hip arthroplasty has been performed. Osseous structures are age-appropriate. No acute bone abnormality. IMPRESSION: 1. Mild pericolonic inflammatory stranding surrounding the hepatic flexure of the colon possibly reflecting a mild infectious or inflammatory colitis. No evidence of obstruction or perforation. Aortic Atherosclerosis (ICD10-I70.0). Electronically Signed   By: Worthy Heads M.D.   On: 10/31/2023 13:19    Procedures .Critical Care  Performed by: Lowery Rue, DO Authorized by: Lowery Rue, DO   Critical care provider statement:    Critical care time (minutes):  45   Critical care was necessary to treat or prevent  imminent or life-threatening deterioration of the following conditions:  Metabolic crisis   Critical care was time spent personally by me on the following activities:  Blood draw for specimens, discussions with primary provider, discussions with consultants, development of treatment plan with patient or surrogate, obtaining history from patient or surrogate, ordering and performing treatments and interventions, ordering and review of laboratory studies, ordering and review of radiographic studies, pulse oximetry, re-evaluation of patient's condition and review of old charts   Care discussed with: admitting provider       Medications Ordered in ED Medications  insulin regular, human (MYXREDLIN) 100 units/ 100 mL infusion (has no administration in time range)  lactated ringers  infusion (has no administration in time range)  dextrose  5 % in lactated ringers  infusion (has no administration in time range)  dextrose  50 % solution 0-50 mL (has no administration in time range)  potassium chloride  10 mEq in 100 mL IVPB (has no administration in time range)  sodium chloride  0.9 % bolus 1,000 mL (0 mLs Intravenous Stopped 10/31/23 1223)  ondansetron  (ZOFRAN ) injection 4 mg (4 mg Intravenous Given 10/31/23 1114)  sodium chloride  0.9 % bolus 1,000 mL (1,000 mLs Intravenous New Bag/Given 10/31/23 1343)  iohexol  (OMNIPAQUE ) 300 MG/ML solution 75 mL (75 mLs Intravenous Contrast Given 10/31/23 1304)    ED Course/ Medical Decision Making/ A&P                                 Medical Decision Making Amount and/or Complexity of Data Reviewed Labs: ordered. Radiology: ordered.  Risk Prescription drug management. Decision regarding hospitalization.   Ariana  D Karon is here with nausea vomiting diarrhea.  She has had somewhat chronic diarrhea for the last few weeks but started to have some vomiting this morning.  Feels dehydrated.  Has abdominal cramping as well.  History of uterine cancer on immunotherapy.   Will get CBC CMP lipase urinalysis CT scan abdomen pelvis IV fluids IV Zofran  and reevaluate.  Differential diagnosis could be food poisoning could be viral process or medication side effect seems less likely to be a bowel obstruction could be a colitis.  Could be UTI.  Not having any current chest pain or shortness of breath.  No concern for any cardiac or pulmonary process at this  time.  Overall per my review and interpretation of labs looks like she is in new DKA.  No prior history of diabetes.  Ketones in the urine pH is 7.24 bicarb is 11, blood sugar 425, anion gap 31.  No leukocytosis or fever.  CT scan of the abdomen pelvis does show mild infectious or inflammatory colitis.  Overall will initiate DKA protocol with insulin.  She is gotten 2 L of fluids.  Will do dose IV Zosyn for the colitis which I suspect is probably more likely inflammatory.  Will admit for further care.  This chart was dictated using voice recognition software.  Despite best efforts to proofread,  errors can occur which can change the documentation meaning.         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

## 2023-10-31 NOTE — ED Notes (Signed)
 Called [facility] for transport.

## 2023-10-31 NOTE — ED Notes (Signed)
 Call to [facility] ICU to give report.  [facility] here to load pt.

## 2023-11-01 ENCOUNTER — Other Ambulatory Visit: Payer: Self-pay

## 2023-11-01 ENCOUNTER — Telehealth (HOSPITAL_COMMUNITY): Payer: Self-pay | Admitting: Pharmacy Technician

## 2023-11-01 ENCOUNTER — Other Ambulatory Visit (HOSPITAL_COMMUNITY): Payer: Self-pay

## 2023-11-01 DIAGNOSIS — Z801 Family history of malignant neoplasm of trachea, bronchus and lung: Secondary | ICD-10-CM | POA: Diagnosis not present

## 2023-11-01 DIAGNOSIS — D649 Anemia, unspecified: Secondary | ICD-10-CM

## 2023-11-01 DIAGNOSIS — C55 Malignant neoplasm of uterus, part unspecified: Secondary | ICD-10-CM | POA: Diagnosis not present

## 2023-11-01 DIAGNOSIS — Z794 Long term (current) use of insulin: Secondary | ICD-10-CM | POA: Diagnosis not present

## 2023-11-01 DIAGNOSIS — Z8542 Personal history of malignant neoplasm of other parts of uterus: Secondary | ICD-10-CM | POA: Diagnosis not present

## 2023-11-01 DIAGNOSIS — Z8051 Family history of malignant neoplasm of kidney: Secondary | ICD-10-CM | POA: Diagnosis not present

## 2023-11-01 DIAGNOSIS — Z96642 Presence of left artificial hip joint: Secondary | ICD-10-CM | POA: Diagnosis present

## 2023-11-01 DIAGNOSIS — K5190 Ulcerative colitis, unspecified, without complications: Secondary | ICD-10-CM

## 2023-11-01 DIAGNOSIS — E1310 Other specified diabetes mellitus with ketoacidosis without coma: Secondary | ICD-10-CM | POA: Diagnosis present

## 2023-11-01 DIAGNOSIS — C799 Secondary malignant neoplasm of unspecified site: Secondary | ICD-10-CM | POA: Diagnosis present

## 2023-11-01 DIAGNOSIS — Z9221 Personal history of antineoplastic chemotherapy: Secondary | ICD-10-CM | POA: Diagnosis not present

## 2023-11-01 DIAGNOSIS — E039 Hypothyroidism, unspecified: Secondary | ICD-10-CM | POA: Diagnosis not present

## 2023-11-01 DIAGNOSIS — Z85828 Personal history of other malignant neoplasm of skin: Secondary | ICD-10-CM | POA: Diagnosis not present

## 2023-11-01 DIAGNOSIS — Z882 Allergy status to sulfonamides status: Secondary | ICD-10-CM | POA: Diagnosis not present

## 2023-11-01 DIAGNOSIS — Z86718 Personal history of other venous thrombosis and embolism: Secondary | ICD-10-CM | POA: Diagnosis not present

## 2023-11-01 DIAGNOSIS — Z79890 Hormone replacement therapy (postmenopausal): Secondary | ICD-10-CM | POA: Diagnosis not present

## 2023-11-01 DIAGNOSIS — E876 Hypokalemia: Secondary | ICD-10-CM | POA: Diagnosis present

## 2023-11-01 DIAGNOSIS — E063 Autoimmune thyroiditis: Secondary | ICD-10-CM | POA: Diagnosis present

## 2023-11-01 DIAGNOSIS — Z833 Family history of diabetes mellitus: Secondary | ICD-10-CM | POA: Diagnosis not present

## 2023-11-01 DIAGNOSIS — E1010 Type 1 diabetes mellitus with ketoacidosis without coma: Secondary | ICD-10-CM | POA: Diagnosis present

## 2023-11-01 DIAGNOSIS — R112 Nausea with vomiting, unspecified: Secondary | ICD-10-CM | POA: Diagnosis present

## 2023-11-01 DIAGNOSIS — Z7982 Long term (current) use of aspirin: Secondary | ICD-10-CM | POA: Diagnosis not present

## 2023-11-01 DIAGNOSIS — K529 Noninfective gastroenteritis and colitis, unspecified: Secondary | ICD-10-CM | POA: Diagnosis present

## 2023-11-01 DIAGNOSIS — Z79899 Other long term (current) drug therapy: Secondary | ICD-10-CM | POA: Diagnosis not present

## 2023-11-01 DIAGNOSIS — D630 Anemia in neoplastic disease: Secondary | ICD-10-CM | POA: Diagnosis present

## 2023-11-01 DIAGNOSIS — Z8249 Family history of ischemic heart disease and other diseases of the circulatory system: Secondary | ICD-10-CM | POA: Diagnosis not present

## 2023-11-01 DIAGNOSIS — Z8049 Family history of malignant neoplasm of other genital organs: Secondary | ICD-10-CM | POA: Diagnosis not present

## 2023-11-01 LAB — BASIC METABOLIC PANEL WITH GFR
Anion gap: 10 (ref 5–15)
Anion gap: 7 (ref 5–15)
Anion gap: 7 (ref 5–15)
BUN: 10 mg/dL (ref 8–23)
BUN: 12 mg/dL (ref 8–23)
BUN: 13 mg/dL (ref 8–23)
CO2: 18 mmol/L — ABNORMAL LOW (ref 22–32)
CO2: 20 mmol/L — ABNORMAL LOW (ref 22–32)
CO2: 22 mmol/L (ref 22–32)
Calcium: 8.5 mg/dL — ABNORMAL LOW (ref 8.9–10.3)
Calcium: 8.5 mg/dL — ABNORMAL LOW (ref 8.9–10.3)
Calcium: 8.7 mg/dL — ABNORMAL LOW (ref 8.9–10.3)
Chloride: 107 mmol/L (ref 98–111)
Chloride: 107 mmol/L (ref 98–111)
Chloride: 109 mmol/L (ref 98–111)
Creatinine, Ser: 0.61 mg/dL (ref 0.44–1.00)
Creatinine, Ser: 0.65 mg/dL (ref 0.44–1.00)
Creatinine, Ser: 0.66 mg/dL (ref 0.44–1.00)
GFR, Estimated: >60 mL/min (ref >60)
GFR, Estimated: >60 mL/min (ref >60)
GFR, Estimated: >60 mL/min (ref >60)
Glucose, Bld: 156 mg/dL — ABNORMAL HIGH (ref 70–99)
Glucose, Bld: 156 mg/dL — ABNORMAL HIGH (ref 70–99)
Glucose, Bld: 174 mg/dL — ABNORMAL HIGH (ref 70–99)
Potassium: 3.2 mmol/L — ABNORMAL LOW (ref 3.5–5.1)
Potassium: 3.6 mmol/L (ref 3.5–5.1)
Potassium: 3.9 mmol/L (ref 3.5–5.1)
Sodium: 135 mmol/L (ref 135–145)
Sodium: 136 mmol/L (ref 135–145)
Sodium: 136 mmol/L (ref 135–145)

## 2023-11-01 LAB — HEMOGLOBIN A1C
Hgb A1c MFr Bld: 7.8 % — ABNORMAL HIGH (ref 4.8–5.6)
Mean Plasma Glucose: 177.16 mg/dL

## 2023-11-01 LAB — GLUCOSE, CAPILLARY
Glucose-Capillary: 120 mg/dL — ABNORMAL HIGH (ref 70–99)
Glucose-Capillary: 121 mg/dL — ABNORMAL HIGH (ref 70–99)
Glucose-Capillary: 141 mg/dL — ABNORMAL HIGH (ref 70–99)
Glucose-Capillary: 144 mg/dL — ABNORMAL HIGH (ref 70–99)
Glucose-Capillary: 148 mg/dL — ABNORMAL HIGH (ref 70–99)
Glucose-Capillary: 153 mg/dL — ABNORMAL HIGH (ref 70–99)
Glucose-Capillary: 155 mg/dL — ABNORMAL HIGH (ref 70–99)
Glucose-Capillary: 161 mg/dL — ABNORMAL HIGH (ref 70–99)
Glucose-Capillary: 164 mg/dL — ABNORMAL HIGH (ref 70–99)
Glucose-Capillary: 167 mg/dL — ABNORMAL HIGH (ref 70–99)
Glucose-Capillary: 191 mg/dL — ABNORMAL HIGH (ref 70–99)
Glucose-Capillary: 274 mg/dL — ABNORMAL HIGH (ref 70–99)
Glucose-Capillary: 310 mg/dL — ABNORMAL HIGH (ref 70–99)

## 2023-11-01 LAB — BETA-HYDROXYBUTYRIC ACID
Beta-Hydroxybutyric Acid: 2.25 mmol/L — ABNORMAL HIGH (ref 0.05–0.27)
Beta-Hydroxybutyric Acid: 0.55 mmol/L — ABNORMAL HIGH (ref 0.05–0.27)
Beta-Hydroxybutyric Acid: 0.28 mmol/L — ABNORMAL HIGH (ref 0.05–0.27)

## 2023-11-01 MED ORDER — INSULIN ASPART 100 UNIT/ML IJ SOLN
5.0000 [IU] | Freq: Three times a day (TID) | INTRAMUSCULAR | Status: DC
  Administered 2023-11-01 – 2023-11-02 (×3): 5 [IU] via SUBCUTANEOUS

## 2023-11-01 MED ORDER — POTASSIUM CHLORIDE CRYS ER 20 MEQ PO TBCR
40.0000 meq | EXTENDED_RELEASE_TABLET | Freq: Once | ORAL | Status: AC
  Administered 2023-11-01: 40 meq via ORAL
  Filled 2023-11-01: qty 2

## 2023-11-01 MED ORDER — LIVING WELL WITH DIABETES BOOK
Freq: Once | Status: AC
  Filled 2023-11-01: qty 1

## 2023-11-01 MED ORDER — INSULIN GLARGINE-YFGN 100 UNIT/ML ~~LOC~~ SOLN
12.0000 [IU] | SUBCUTANEOUS | Status: DC
  Administered 2023-11-01: 12 [IU] via SUBCUTANEOUS
  Filled 2023-11-01: qty 0.12

## 2023-11-01 MED ORDER — INSULIN STARTER KIT- PEN NEEDLES (ENGLISH)
1.0000 | Freq: Once | Status: AC
  Administered 2023-11-01: 1
  Filled 2023-11-01: qty 1

## 2023-11-01 MED ORDER — INSULIN ASPART 100 UNIT/ML IJ SOLN: 0.0000 [IU] | Freq: Every day | INTRAMUSCULAR | Status: DC

## 2023-11-01 MED ORDER — INSULIN ASPART 100 UNIT/ML IJ SOLN
3.0000 [IU] | Freq: Three times a day (TID) | INTRAMUSCULAR | Status: DC
  Administered 2023-11-01: 3 [IU] via SUBCUTANEOUS

## 2023-11-01 MED ORDER — INSULIN ASPART 100 UNIT/ML IJ SOLN
0.0000 [IU] | Freq: Three times a day (TID) | INTRAMUSCULAR | Status: DC
  Administered 2023-11-01: 2 [IU] via SUBCUTANEOUS
  Administered 2023-11-01: 7 [IU] via SUBCUTANEOUS
  Administered 2023-11-02 (×2): 2 [IU] via SUBCUTANEOUS

## 2023-11-01 MED ORDER — INSULIN GLARGINE-YFGN 100 UNIT/ML ~~LOC~~ SOLN
15.0000 [IU] | SUBCUTANEOUS | Status: DC
  Filled 2023-11-01: qty 0.15

## 2023-11-01 NOTE — Plan of Care (Signed)
   Problem: Education: Goal: Knowledge of General Education information will improve Description Including pain rating scale, medication(s)/side effects and non-pharmacologic comfort measures Outcome: Progressing

## 2023-11-01 NOTE — Progress Notes (Signed)
 [name]   DOB:08/22/1950   MW#:[id number]      ASSESSMENT & PLAN:  DKA, with no history of DM Generalized weakness Diarrhea with nausea/vomiting - Patient reports 10-day history of progressive weakness and increasing diarrhea.  Also complaining of nausea with vomiting yesterday.  Therefore she made the decision to come into the [facility] ED for eval.  Upon arrival blood glucose was 425.  Insulin infusion was given and patient transferred to [facility]. - Monitor blood glucose levels closely  - Continue insulin per protocol. 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 - Recommend hold Keytruda  at this time as diabetes may be a rare complication of Keytruda . - Last seen in outpatient oncology office 10/19/2023. - Medical oncology/Dr. Marton Sleeper following.  Dr. Marton Sleeper will see patient tomorrow and make further treatment recommendations.  Colitis - Per CT scan - Monitor closely  Hypothyroidism - On Synthroid  100 mcg p.o. daily - Most recent TSH WNL 0.780 on 10/31/23.  Anemia, normocytic - Mild - Last hemoglobin 11.6 on 10/31/2023 - Continue to monitor CBC with differential   Code Status Full  Subjective:  Patient seen awake alert and oriented x 3 sitting in chair at bedside.  Reports 10 days of diarrhea which became much worse towards the end of the 10 days with progressive weakness.  Also states 10 pound weight loss in 10 days.  Denies excessive thirst hunger or urination.  She did note that she felt "full" after urinating.  No other acute distress offered at this time.  Objective:  Vitals:   11/01/23 0900 11/01/23 1000   BP: 125/63 (!) 121/54  Pulse: 71 63  Resp: 13 16  Temp:    SpO2: 99% 98%     Intake/Output Summary (Last 24 hours) at 11/01/2023 1131 Last data filed at 11/01/2023 0944 Gross per 24 hour  Intake 5653.68 ml  Output --  Net 5653.68 ml     PHYSICAL EXAMINATION: ECOG PERFORMANCE STATUS: 1 - Symptomatic but completely ambulatory  Vitals:   11/01/23 0900 11/01/23 1000  BP: 125/63 (!) 121/54  Pulse: 71 63  Resp: 13 16  Temp:    SpO2: 99% 98%   Filed Weights   10/31/23 1030 10/31/23 1800  Weight: 125 lb 8 oz (56.9 kg) 132 lb 15 oz (60.3 kg)    GENERAL: alert, no distress and comfortable SKIN: skin color, texture, turgor are normal, no rashes or significant lesions EYES: normal, conjunctiva are pink and non-injected, sclera clear OROPHARYNX: no exudate, no erythema and lips, buccal mucosa, and tongue normal  NECK: supple, thyroid  normal size, non-tender, without nodularity LYMPH: no palpable lymphadenopathy in the cervical, axillary or inguinal LUNGS: clear to auscultation and percussion with normal breathing effort HEART: regular rate & rhythm and no murmurs and no lower extremity edema ABDOMEN: abdomen soft, non-tender and normal bowel sounds MUSCULOSKELETAL: no cyanosis of digits and no clubbing  PSYCH: alert & oriented x 3 with fluent speech NEURO: no focal motor/sensory deficits   All questions were answered. The patient knows to call the clinic with any problems, questions or concerns.   The total time spent in the appointment was 40 minutes encounter with patient including review of chart and various tests results, discussions  about plan of care and coordination of care plan  Jacqualin Mate, NP 11/01/2023 11:31 AM    Labs Reviewed:  Lab Results  Component Value Date   WBC 7.2 10/31/2023   HGB 11.6 (L) 10/31/2023   HCT 34.0 (L) 10/31/2023   MCV 87.4 10/31/2023   PLT 242 10/31/2023   Recent Labs    10/19/23 1240 10/31/23 1106 10/31/23 1332 10/31/23 1341  11/01/23 0024 11/01/23 0426 11/01/23 0801  NA 136 133* 133*   < > 135 136 136  K 3.6 4.3 4.5   < > 3.9 3.2* 3.6  CL 102 91* 95*   < > 107 109 107  CO2 29 11* 10*   < > 18* 20* 22  GLUCOSE 118* 425* 368*   < > 174* 156* 156*  BUN 17 18 17    < > 13 12 10   CREATININE 0.77 1.08* 0.95   < > 0.66 0.65 0.61  CALCIUM 9.0 9.8 9.1   < > 8.7* 8.5* 8.5*  GFRNONAA >60 55* >60   < > >60 >60 >60  PROT 6.6 7.4 6.8  --   --   --   --   ALBUMIN 4.0 4.4 4.1  --   --   --   --   AST 15 17 16   --   --   --   --   ALT 9 15 13   --   --   --   --   ALKPHOS 78 127* 117  --   --   --   --   BILITOT 0.5 0.7 0.5  --   --   --   --    < > = values in this interval not displayed.    Studies Reviewed:  CT ABDOMEN PELVIS W CONTRAST Result Date: 10/31/2023 CLINICAL DATA:  Acute nonlocalized abdominal pain EXAM: CT ABDOMEN AND PELVIS WITH CONTRAST TECHNIQUE: Multidetector CT imaging of the abdomen and pelvis was performed using the standard protocol following bolus administration of intravenous contrast. RADIATION DOSE REDUCTION: This exam was performed according to the departmental dose-optimization program which includes automated exposure control, adjustment of the mA and/or kV according to patient size and/or use of iterative reconstruction technique. CONTRAST:  75mL OMNIPAQUE  IOHEXOL  300 MG/ML  SOLN COMPARISON:  None Available. FINDINGS: Lower chest: No acute abnormality. Hepatobiliary: No focal liver abnormality is seen. No gallstones, gallbladder wall thickening, or biliary dilatation. Pancreas: Unremarkable Spleen: Unremarkable Adrenals/Urinary Tract: The adrenal glands are unremarkable. The kidneys are normal. The bladder is partially obscured by streak artifact, however, the visualized portion is unremarkable. Stomach/Bowel: There is mild pericolonic inflammatory stranding surrounding the hepatic flexure of the colon best seen on image # 42/2, possibly to a mild underlying infectious or inflammatory colitis. There is  no evidence of obstruction or perforation. The stomach, small bowel, and large bowel are otherwise unremarkable. Appendix normal. No free intraperitoneal gas or fluid. Vascular/Lymphatic: Aortic atherosclerosis. No enlarged abdominal or pelvic lymph nodes. Index right inguinal lymph node is stable measuring 9 mm in short axis diameter at axial image # 75/2. Reproductive: Status post hysterectomy. No adnexal masses. Other: No abdominal wall hernia or abnormality. No abdominopelvic ascites. Musculoskeletal: Left total hip arthroplasty has been performed. Osseous structures are age-appropriate. No acute bone abnormality. IMPRESSION: 1. Mild pericolonic inflammatory stranding surrounding the hepatic flexure of the colon possibly reflecting a mild infectious or inflammatory colitis. No evidence of obstruction or perforation. Aortic Atherosclerosis (ICD10-I70.0). Electronically Signed   By: Kristene Petrin  Jackquelyn Mass M.D.   On: 10/31/2023 13:19

## 2023-11-01 NOTE — Telephone Encounter (Signed)
 Pharmacy Patient Advocate Encounter   Received notification from Inpatient Request that prior authorization for FreeStyle Libre 3 Plus Sensor is required/requested.   Insurance verification completed.   The patient is insured through [employer] .   Per test claim: PA required; PA submitted to above mentioned insurance via CoverMyMeds Key/confirmation #/EOC [id number] Status is pending

## 2023-11-01 NOTE — Telephone Encounter (Signed)
 Patient Product/process development scientist completed.    The patient is insured through [employer]. Patient has Medicare and is not eligible for a copay card, but may be able to apply for patient assistance or Medicare RX Payment Plan (Patient Must reach out to their plan, if eligible for payment plan), if available.    Ran test claim for Lantus Pen and the current 30 day co-pay is $35.00.  Ran test claim for Novolog FlexPen and the current 30 day co-pay is $35.00.  Ran test claim for Dexcom G7 Sensor and Requires Prior Authorization  Ran test claim for [employer] 3 Plus Sensor and Requires Prior Authorization  This test claim was processed through [employer]- copay amounts may vary at other pharmacies due to [employer], or as the patient moves through the different stages of their insurance plan.     Morgan Arab, CPHT Pharmacy Technician III Certified Patient Advocate [facility] Pharmacy Patient Advocate Team Direct Number: [phone]  Fax: [phone]

## 2023-11-01 NOTE — Progress Notes (Signed)
 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 Presented with AG 31, pH 7.24 AGMA now resolved with insulin gtt Will transition to basal bolus regimen with SSI Follow pending c peptide level (follow antibodies diabetes) Expect her to require insulin long term Diabetic educator  A1c 7.4 (this likely underestimates her recent blood sugars with the recent development of diabetes)  Diarrhea CT shows evidence of mild infectious or inflammatory colitis She hasn't had any diarrhea since Friday If she has diarrhea, will order C diff and GI pathogen panel (consider lactoferrin as well) Ddx would include checkpoint inhibitor colitis, pancreatic insufficiency, celiac disease  She's relatively asymptomatic at this time, will monitor symptoms for now -> if recurrent issues, will need to consider GI consult, steroids, etc.  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 Camilla Cedar assistance, she'll be by tomorrow  Hypothyroidism Hashimoto's  Thyroiditis Synthroid    Hopefully can discharge early tomorrow if she's improved.  She's [employer] and has Tremain Rucinski trip to [employer] [place] scheduled tomorrow (she's not planning to play).  Her discharge will be pending glycemic control, tolerance of PO diet, GI symptoms/diarrhea, and oncology recommendations.     DVT prophylaxis: lovenox  Code Status: full Family Communication: none Disposition:   Status is: Observation The patient remains OBS appropriate and will d/c before 2 midnights.   Consultants:  oncology  Procedures:  none  Antimicrobials:  Anti-infectives (From admission, onward)    Start     Dose/Rate Route Frequency Ordered Stop   10/31/23 1400  piperacillin-tazobactam (ZOSYN) IVPB 3.375 g        3.375 g 100 mL/hr over 30 Minutes Intravenous  Once 10/31/23 1357 10/31/23 1423       Subjective: No new complaints Wants to eat Wants to go home No BM since Friday, passing some gas  Objective: Vitals:   11/01/23 0500 11/01/23 0603 11/01/23 0747 11/01/23 0800  BP: 119/65 (!) 122/59  113/65  Pulse: 61   66  Resp: 15 15  14   Temp:   98.8 F (37.1 C)   TempSrc:   Oral   SpO2: 97%   98%  Weight:      Height:        Intake/Output Summary (Last 24 hours) at 11/01/2023 0819 Last data filed at 11/01/2023 0600 Gross per 24 hour  Intake 5001.51 ml  Output --  Net 5001.51 ml   Filed Weights   10/31/23 1030 10/31/23 1800  Weight: 56.9 kg 60.3 kg    Examination:  General exam: Appears calm and comfortable  Respiratory system: unlabored Cardiovascular system: RRR Gastrointestinal system: Abdomen is nondistended, soft and nontender.  Central nervous system: Alert and oriented. No focal neurological deficits. Extremities: no LEE   Data Reviewed: I have personally reviewed following labs and imaging studies  CBC: Recent Labs  Lab 10/31/23 1106 10/31/23 1341  WBC 7.2  --   NEUTROABS 6.2  --   HGB 12.2 11.6*  HCT 34.6* 34.0*  MCV 87.4   --   PLT 242  --     Basic Metabolic Panel: Recent Labs  Lab 10/31/23 1332 10/31/23 1341 10/31/23 1507 10/31/23 2026 11/01/23 0024 11/01/23 0426  NA 133* 132* 134* 133* 135 136  K 4.5 4.4 4.1 3.4* 3.9 3.2*  CL 95*  --  100 106 107 109  CO2 10*  --  9* 22 18* 20*  GLUCOSE 368*  --  281* 145* 174* 156*  BUN 17  --  16 14 13 12   CREATININE 0.95  --  0.94 0.64 0.66 0.65  CALCIUM 9.1  --  8.6* 8.7* 8.7* 8.5*    GFR: Estimated Creatinine Clearance: 53.4 mL/min (by C-G formula based on SCr of 0.65 mg/dL).  Liver Function Tests: Recent Labs  Lab 10/31/23 1106 10/31/23 1332  AST 17 16  ALT 15 13  ALKPHOS 127* 117  BILITOT 0.7 0.5  PROT 7.4 6.8  ALBUMIN 4.4 4.1    CBG: Recent Labs  Lab 11/01/23 0344 11/01/23 0450 11/01/23 0548 11/01/23 0648 11/01/23 0748  GLUCAP 153* 144* 148* 164* 161*     Recent Results (from the past 240 hours)  MRSA Next Gen by PCR, Nasal     Status: None   Collection Time: 10/31/23  6:11 PM   Specimen: Nasal Mucosa; Nasal Swab  Result Value Ref Range Status   MRSA by PCR Next Gen NOT DETECTED NOT DETECTED Final    Comment: (NOTE) The GeneXpert MRSA Assay (FDA approved for NASAL specimens only), is one component of Dereonna Lensing comprehensive MRSA colonization surveillance program. It is not intended to diagnose MRSA infection nor to guide or monitor treatment for MRSA infections. Test performance is not FDA approved in patients less than 37 years old. Performed at [facility], [address], [place], [place] [address]          Radiology Studies: CT ABDOMEN PELVIS W CONTRAST Result Date: 10/31/2023 CLINICAL DATA:  Acute nonlocalized abdominal pain EXAM: CT ABDOMEN AND PELVIS WITH CONTRAST TECHNIQUE: Multidetector CT imaging of the abdomen and pelvis was performed using the standard protocol following bolus administration of intravenous contrast. RADIATION DOSE REDUCTION: This exam was performed according to the departmental  dose-optimization program which includes automated exposure control, adjustment of the mA and/or kV according to patient size and/or use of iterative reconstruction technique. CONTRAST:  75mL OMNIPAQUE  IOHEXOL  300 MG/ML  SOLN COMPARISON:  None Available. FINDINGS: Lower chest: No acute abnormality. Hepatobiliary: No focal liver abnormality is seen. No gallstones, gallbladder wall thickening, or biliary dilatation. Pancreas: Unremarkable Spleen: Unremarkable Adrenals/Urinary Tract: The adrenal glands are unremarkable. The kidneys are normal. The bladder is partially obscured by streak artifact, however, the visualized portion is unremarkable. Stomach/Bowel: There is mild pericolonic inflammatory stranding surrounding the hepatic flexure of the colon best seen on image # 42/2, possibly to Tommi Crepeau mild underlying infectious or inflammatory colitis. There is no evidence of obstruction or perforation. The stomach, small bowel, and large bowel are otherwise unremarkable. Appendix normal. No free intraperitoneal gas or fluid. Vascular/Lymphatic: Aortic atherosclerosis. No enlarged abdominal or pelvic lymph nodes. Index right inguinal lymph node is stable measuring 9 mm in short  axis diameter at axial image # 75/2. Reproductive: Status post hysterectomy. No adnexal masses. Other: No abdominal wall hernia or abnormality. No abdominopelvic ascites. Musculoskeletal: Left total hip arthroplasty has been performed. Osseous structures are age-appropriate. No acute bone abnormality. IMPRESSION: 1. Mild pericolonic inflammatory stranding surrounding the hepatic flexure of the colon possibly reflecting Zykerria Tanton mild infectious or inflammatory colitis. No evidence of obstruction or perforation. Aortic Atherosclerosis (ICD10-I70.0). Electronically Signed   By: Worthy Heads M.D.   On: 10/31/2023 13:19        Scheduled Meds:  aspirin EC  81 mg Oral Daily   Chlorhexidine  Gluconate Cloth  6 each Topical Daily   enoxaparin  (LOVENOX )  injection  40 mg Subcutaneous Q24H   insulin aspart  0-5 Units Subcutaneous QHS   insulin aspart  0-9 Units Subcutaneous TID WC   insulin aspart  3 Units Subcutaneous TID WC   insulin glargine-yfgn  12 Units Subcutaneous Q24H   levothyroxine   100 mcg Oral Q0600   Continuous Infusions:  dextrose  5% lactated ringers  125 mL/hr at 11/01/23 0600   insulin 0.9 Units/hr (11/01/23 0600)   lactated ringers  Stopped (10/31/23 2124)     LOS: 0 days    Time spent: over 30 min    Donnetta Gains, MD [facility]   To contact the attending provider between 7A-7P or the covering provider during after hours 7P-7A, please log into the web site [email or web address] and access using universal [place] password for that web site. If you do not have the password, please call the hospital operator.  11/01/2023, 8:19 AM

## 2023-11-01 NOTE — Inpatient Diabetes Management (Signed)
 Inpatient Diabetes Program Recommendations  AACE/ADA: New Consensus Statement on Inpatient Glycemic Control (2015)  Target Ranges:  Prepandial:   less than 140 mg/dL      Peak postprandial:   less than 180 mg/dL (1-2 hours)      Critically ill patients:  140 - 180 mg/dL   Lab Results  Component Value Date   GLUCAP 310 (H) 11/01/2023   HGBA1C 7.8 (H) 10/31/2023    Review of Glycemic Control  Diabetes history: New-onset DM Outpatient Diabetes medications: None Current orders for Inpatient glycemic control: Semglee 12 units Q24H, Novolog 0-9 TID with meals and 0-5 HS + 3 units TID  HgbA1C - 7.8% 121, 274, 310 mg/dL Eating 16%XWRU  Inpatient Diabetes Program Recommendations:    Consider increasing Novolog to 5 units TID with meals  Please order Libre CGM at discharge.   Spoke with patient about new diabetes diagnosis.  Discussed A1C results (7.8%) and explained what an A1C is and informed patient that his current A1C indicates an average glucose of 210 mg/dl over the past 2-3 months. Discussed basic pathophysiology of DM,, basic home care, importance of checking CBGs and maintaining good CBG control to prevent long-term and short-term complications. Reviewed glucose and A1C goals. Reviewed signs and symptoms of hyperglycemia and hypoglycemia along with treatment for both. Discussed impact of nutrition, exercise, stress, sickness, and medications on diabetes control. Ordered Living Well with diabetes booklet and encouraged patient to read through entire book. Gave pt information regarding Libre CGM.  Educated patient and spouse on insulin pen use at home. Reviewed contents of insulin flexpen starter kit. Reviewed all steps if insulin pen including attachment of needle, 2-unit air shot, dialing up dose, giving injection, removing needle, disposal of sharps, storage of unused insulin, disposal of insulin etc. Patient able to provide successful return demonstration. Also reviewed  troubleshooting with insulin pen. MD to give patient Rxs for insulin pens and insulin pen needles. Pt has Endocrinologist Dr Aldona Amel and will make appt to f/u in week or two.  Pt's questions answered and will f/u in am.   Thank you. Joni Net, RD, LDN, CDCES Inpatient Diabetes Coordinator [phone]

## 2023-11-01 NOTE — Plan of Care (Signed)
  Problem: Activity: Goal: Risk for activity intolerance will decrease Outcome: Progressing   Problem: Coping: Goal: Level of anxiety will decrease Outcome: Progressing   Problem: Elimination: Goal: Will not experience complications related to urinary retention Outcome: Progressing   Problem: Skin Integrity: Goal: Risk for impaired skin integrity will decrease Outcome: Progressing   Problem: Skin Integrity: Goal: Risk for impaired skin integrity will decrease Outcome: Progressing

## 2023-11-02 ENCOUNTER — Other Ambulatory Visit: Payer: Self-pay | Admitting: Hematology and Oncology

## 2023-11-02 ENCOUNTER — Other Ambulatory Visit (HOSPITAL_COMMUNITY): Payer: Self-pay

## 2023-11-02 DIAGNOSIS — C55 Malignant neoplasm of uterus, part unspecified: Secondary | ICD-10-CM | POA: Diagnosis not present

## 2023-11-02 DIAGNOSIS — E1310 Other specified diabetes mellitus with ketoacidosis without coma: Secondary | ICD-10-CM | POA: Diagnosis not present

## 2023-11-02 LAB — BASIC METABOLIC PANEL WITH GFR
Anion gap: 7 (ref 5–15)
BUN: 11 mg/dL (ref 8–23)
CO2: 26 mmol/L (ref 22–32)
Calcium: 8.4 mg/dL — ABNORMAL LOW (ref 8.9–10.3)
Chloride: 103 mmol/L (ref 98–111)
Creatinine, Ser: 0.54 mg/dL (ref 0.44–1.00)
GFR, Estimated: >60 mL/min (ref >60)
Glucose, Bld: 146 mg/dL — ABNORMAL HIGH (ref 70–99)
Potassium: 3.4 mmol/L — ABNORMAL LOW (ref 3.5–5.1)
Sodium: 136 mmol/L (ref 135–145)

## 2023-11-02 LAB — GLUCOSE, CAPILLARY
Glucose-Capillary: 176 mg/dL — ABNORMAL HIGH (ref 70–99)
Glucose-Capillary: 190 mg/dL — ABNORMAL HIGH (ref 70–99)

## 2023-11-02 MED ORDER — PEN NEEDLES 31G X 5 MM MISC
1.0000 | Freq: Three times a day (TID) | 0 refills | Status: DC
Start: 2023-11-02 — End: 2024-01-12
  Filled 2023-11-02: qty 100, 30d supply, fill #0

## 2023-11-02 MED ORDER — LANCET DEVICE MISC
1.0000 | Freq: Three times a day (TID) | 0 refills | Status: AC
  Filled 2023-11-02: qty 1, fill #0

## 2023-11-02 MED ORDER — BLOOD GLUCOSE TEST VI STRP
1.0000 | ORAL_STRIP | Freq: Three times a day (TID) | 0 refills | Status: AC
  Filled 2023-11-02: qty 100, 34d supply, fill #0

## 2023-11-02 MED ORDER — INSULIN ASPART 100 UNIT/ML FLEXPEN
5.0000 [IU] | PEN_INJECTOR | Freq: Three times a day (TID) | SUBCUTANEOUS | 0 refills | Status: DC
  Filled 2023-11-02: qty 15, 30d supply, fill #0

## 2023-11-02 MED ORDER — LANCETS MISC
1.0000 | Freq: Three times a day (TID) | 0 refills | Status: AC
Start: 2023-11-02 — End: ?
  Filled 2023-11-02: qty 100, 30d supply, fill #0

## 2023-11-02 MED ORDER — INSULIN GLARGINE 100 UNIT/ML SOLOSTAR PEN
12.0000 [IU] | PEN_INJECTOR | Freq: Every day | SUBCUTANEOUS | 1 refills | Status: DC
  Filled 2023-11-02: qty 15, 90d supply, fill #0

## 2023-11-02 MED ORDER — POTASSIUM CHLORIDE CRYS ER 20 MEQ PO TBCR
40.0000 meq | EXTENDED_RELEASE_TABLET | ORAL | Status: AC
  Administered 2023-11-02 (×2): 40 meq via ORAL
  Filled 2023-11-02 (×2): qty 2

## 2023-11-02 MED ORDER — BLOOD GLUCOSE MONITOR SYSTEM W/DEVICE KIT
1.0000 | PACK | Freq: Three times a day (TID) | 0 refills | Status: AC
  Filled 2023-11-02: qty 1, 30d supply, fill #0

## 2023-11-02 MED ORDER — INSULIN GLARGINE-YFGN 100 UNIT/ML ~~LOC~~ SOLN
12.0000 [IU] | SUBCUTANEOUS | Status: DC
Start: 2023-11-02 — End: 2023-11-02
  Administered 2023-11-02: 12 [IU] via SUBCUTANEOUS
  Filled 2023-11-02: qty 0.12

## 2023-11-02 MED ORDER — HEPARIN SOD (PORK) LOCK FLUSH 100 UNIT/ML IV SOLN
500.0000 [IU] | INTRAVENOUS | Status: AC | PRN
  Administered 2023-11-02: 500 [IU]
  Filled 2023-11-02: qty 5

## 2023-11-02 NOTE — Progress Notes (Signed)
 Lisa  D Zavala   DOB:12-Aug-1950   [id number]    ASSESSMENT & PLAN:  Stage IV uterine cancer 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, responded very well with complete response with neoadjuvant chemotherapy with combination of carboplatin , paclitaxel  and pembrolizumab  followed by surgery and maintenance immunotherapy with pembrolizumab  Er neg, MSI stable, PD-L1 80%    Her recent CT imaging in May was reviewed which showed no evidence of disease I am doubtful she can resume pembrolizumab  I will cancel her scheduled CT imaging and appointment next month Continue supportive care  Diabetic ketoacidosis Could be immune related side effects of pembrolizumab  She will go home on insulin treatment Receiving diabetic education today  Discharge planning Will defer to primary service I will bring her appointment to see me back sooner within a week or 2 weeks after discharge  Almeda Jacobs, MD 11/02/2023 10:50 AM  Subjective:  The patient was admitted to the hospital with diagnosis of DKA, could be due to side effects related to pembrolizumab  She was placed on continuous insulin drip with improvement The patient is receiving diabetic education She appears well today Denies nausea Able to tolerate oral intake We discussed plan of care as outlined above  Objective:  Vitals:   11/02/23 0432 11/02/23 0810  BP:  (!) 141/65  Pulse:  73  Resp:  (!) 21  Temp: 98.7 F (37.1 C) 98.5 F (36.9 C)  SpO2:  99%     Intake/Output Summary (Last 24 hours) at 11/02/2023 1050 Last data filed at 11/02/2023 1478 Gross per 24 hour  Intake 300 ml  Output --  Net 300 ml   I personally reviewed her blood work and imaging studies

## 2023-11-02 NOTE — Discharge Summary (Signed)
 Physician Discharge Summary  [name] NFA:[id number] DOB: Jun 06, 1951 DOA: 10/31/2023  PCP: Rada Buerger, MD  Admit date: 10/31/2023 Discharge date: 11/02/2023  Time spent: 40 minutes  Recommendations for Outpatient Follow-up:  Follow outpatient CBC/CMP  Follow pending c peptide, diabetes antibodies  Unclear significance colitis - she's asymptomatic - workup additionally as indicated based on symptoms Outpatient oncology follow up  Discharge Diagnoses:  Principal Problem:   DKA (diabetic ketoacidosis) (HCC) Active Problems:   Hypothyroidism due to Hashimoto's thyroiditis   Uterine cancer (HCC)   Colitis   Discharge Condition: stable  Diet recommendation: diabetic  Filed Weights   10/31/23 1030 10/31/23 1800  Weight: 56.9 kg 60.3 kg    History of present illness:   73 yo with hx 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   She's improved after management with insulin.   Stable for discharge on 5/6.  See below for additional details.  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  Unclear significance of CT imaging and symptoms.  Follow outpatient and workup additionally as  indicated.   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 Camilla Zavala assistance, she's doubtful she can resume pembrolizumab .  Outpatient follow up with oncology as recommended.    Hypothyroidism Hashimoto's Thyroiditis Synthroid       Procedures: none   Consultations: oncology  Discharge Exam: Vitals:   11/02/23 0432 11/02/23 0810  BP:  (!) 141/65  Pulse:  73  Resp:  (!) 21  Temp: 98.7 F (37.1 C) 98.5 F (36.9 C)  SpO2:  99%   Feeling better Normal BM today Eager to discharge, but not in [name] rush, her tennis match has been rescheduled  General: No acute distress. Cardiovascular: RRR Lungs: unlabored Abdomen: Soft, nontender, nondistended Neurological: Alert and oriented 3. Moves all extremities 4 with equal strength. Cranial nerves II through XII grossly intact. Extremities: No clubbing or cyanosis. No edema.  Discharge Instructions   Discharge Instructions     Call MD for:  difficulty breathing, headache or visual disturbances   Complete by: As directed    Call MD for:  extreme fatigue   Complete by: As directed    Call MD for:  hives   Complete by: As directed    Call MD for:  persistant dizziness or light-headedness   Complete by: As directed    Call MD for:  persistant nausea and vomiting   Complete by: As directed    Call MD for:  redness, tenderness, or signs of infection (pain, swelling, redness, odor or green/yellow discharge around incision site)   Complete by: As directed    Call MD for:  severe uncontrolled pain   Complete by: As directed    Call MD for:  temperature >100.4   Complete by: As directed    Diet - low sodium heart healthy   Complete by: As directed    Discharge instructions   Complete by: As directed    You were seen for DKA.  This was related to your keytruda , most likely.    I expect you'll need insulin  long term.  We've started you on long acting and short acting insulin (mealtime + correctional).  Take the long acting every day.  Take the mealtime dose when you're eating (if you're not eating, use the correctional dose only).    Arrange follow up with endocrinology as an outpatient.  You have some pending labs that you can follow with endocrine or your PCP outpatient.  You have [name] pending C-peptide level, insulin antibodies, ZNT antibodies, IA2 autoantibodies, and glutamic acid decarboxylase auto antibodies that are pending at the time of discharge (these are looking for causes of type 1 diabetes, but as we said above, I suspect this is related to the keytruda ).  Your CT scan showed evidence of mild inflammation in your colon.  It's not quite clear if this is clinically relevant or significant.  If you have ongoing diarrhea or new abdominal pain/discomfort, this should be followed up with [name] or your primary (colitis - inflammation of the colon - is often followed with gastroenterology, but since you're asymptomatic, you can follow this with your PCP or [name]).  Return for new, recurrent, or worsening symptoms.  Please ask your PCP to request records from this hospitalization so they know what was done and what the next steps will be.   Increase activity slowly   Complete by: As directed       Allergies as of 11/02/2023       Reactions   Sulfa Antibiotics Swelling, Other (See Comments)   Caused tics and the tongue became swollen for approx [name] week        Medication List     TAKE these medications    acetaminophen  500 MG tablet Commonly known as: TYLENOL  Take 500-1,000 mg by mouth every 6 (six) hours as needed (for muscle pain or soreness).   aspirin EC 81 MG tablet Take 81 mg by mouth at bedtime. Swallow whole.   Blood Glucose Monitoring Suppl Devi 1 each by Does not apply route 3 (three) times daily. May dispense any manufacturer covered by patient's insurance.    BLOOD GLUCOSE TEST STRIPS Strp 1 each by Does not apply route 3 (three) times daily. Use as directed to check blood sugar. May dispense any manufacturer covered by patient's insurance and fits patient's device.   diclofenac  sodium 1 % Gel Commonly known as: Voltaren  Apply 2-4 g topically 4 (four) times daily. What changed:  when to take this reasons to take this   gabapentin  100 MG capsule Commonly known as: NEURONTIN  Take 100-200 mg by mouth daily as needed (for hot flashes).   gabapentin  300 MG capsule Commonly known as: NEURONTIN  Take 300 mg by mouth daily as needed (for hot flashes not relieved by the 100-200 mg dose).   insulin aspart 100 UNIT/ML FlexPen Commonly known as: NOVOLOG Inject 5 Units into the skin 3 (three) times daily with meals. If eating and Blood Glucose (BG) 80 or higher inject 5 units for meal coverage and add correction dose per scale. If not eating, correction dose only. BG <150= 0 unit; BG 150-200= 1 unit; BG 201-250= 2 unit; BG 251-300= 3 unit; BG 301-350= 4 unit;  BG 351-400= 5 unit; BG >400= 6 unit and Call Primary Care.   insulin glargine 100 UNIT/ML Solostar Pen Commonly known as: LANTUS Inject 12 Units into the skin daily. May substitute as needed per insurance.   Lancet Device Misc 1 each by Does not apply route 3 (three) times daily. May dispense any manufacturer covered by patient's insurance.   Lancets Misc 1 each by Does not apply route 3 (three) times daily. Use as directed to check blood sugar. May dispense any manufacturer covered by patient's insurance and fits patient's device.   levothyroxine  100 MCG tablet Commonly known as: SYNTHROID  Take 1 tablet (100 mcg total) by mouth daily. What changed: when to take this   lidocaine -prilocaine  cream Commonly known as: EMLA  Apply 1 Application topically as needed. What changed: reasons to take this   melatonin 5 MG Tabs Take 5 mg by mouth at bedtime as needed (sleep).   multivitamin  tablet Take 1 tablet by mouth daily with breakfast.   Pen Needles 31G X 5 MM Misc 1 each by Does not apply route 3 (three) times daily. May dispense any manufacturer covered by patient's insurance.   THERATEARS OP Place 1 drop into both eyes 3 (three) times daily.   Tums 500 MG chewable tablet Generic drug: calcium carbonate Chew 1 tablet by mouth daily.   Vitamin D  50 MCG (2000 UT) tablet Take 2,000 Units by mouth daily.       Allergies  Allergen Reactions   Sulfa Antibiotics Swelling and Other (See Comments)    Caused tics and the tongue became swollen for approx [name] week      The results of significant diagnostics from this hospitalization (including imaging, microbiology, ancillary and laboratory) are listed below for reference.    Significant Diagnostic Studies: CT ABDOMEN PELVIS W CONTRAST Result Date: 10/31/2023 CLINICAL DATA:  Acute nonlocalized abdominal pain EXAM: CT ABDOMEN AND PELVIS WITH CONTRAST TECHNIQUE: Multidetector CT imaging of the abdomen and pelvis was performed using the standard protocol following bolus administration of intravenous contrast. RADIATION DOSE REDUCTION: This exam was performed according to the departmental dose-optimization program which includes automated exposure control, adjustment of the mA and/or kV according to patient size and/or use of iterative reconstruction technique. CONTRAST:  75mL OMNIPAQUE  IOHEXOL  300 MG/ML  SOLN COMPARISON:  None Available. FINDINGS: Lower chest: No acute abnormality. Hepatobiliary: No focal liver abnormality is seen. No gallstones, gallbladder wall thickening, or biliary dilatation. Pancreas: Unremarkable Spleen: Unremarkable Adrenals/Urinary Tract: The adrenal glands are unremarkable. The kidneys are normal. The bladder is partially obscured by streak artifact, however, the visualized portion is unremarkable. Stomach/Bowel: There is mild pericolonic inflammatory stranding surrounding the hepatic flexure of the colon  best seen on image # 42/2, possibly to [name] mild underlying infectious or inflammatory colitis. There is no evidence of obstruction or perforation. The stomach, small bowel, and large bowel are otherwise unremarkable. Appendix normal. No free intraperitoneal gas or fluid. Vascular/Lymphatic: Aortic atherosclerosis. No enlarged abdominal or pelvic lymph nodes. Index right inguinal lymph node is stable measuring 9 mm in short axis diameter at axial image # 75/2. Reproductive: Status post hysterectomy. No adnexal masses. Other: No abdominal wall hernia or abnormality. No abdominopelvic ascites. Musculoskeletal: Left total hip arthroplasty has been performed. Osseous structures are age-appropriate. No acute bone abnormality. IMPRESSION: 1. Mild pericolonic inflammatory stranding surrounding the hepatic flexure of the colon possibly reflecting Tayton Decaire mild infectious or inflammatory colitis. No evidence of obstruction or perforation. Aortic Atherosclerosis (ICD10-I70.0). Electronically Signed   By: [name]  [name] M.D.   On: 10/31/2023 13:19    Microbiology: Recent Results (from the past 240 hours)  MRSA Next Gen by PCR, Nasal     Status: None   Collection Time: 10/31/23  6:11 PM   Specimen: Nasal Mucosa; Nasal Swab  Result Value Ref Range Status   MRSA by PCR Next Gen NOT DETECTED NOT DETECTED Final    Comment: (NOTE) The GeneXpert MRSA Assay (FDA approved for NASAL specimens only), is one component of [name] comprehensive MRSA colonization surveillance program. It is not intended to diagnose MRSA infection nor to guide or monitor treatment for MRSA infections. Test performance is not FDA approved in patients less than 25 years old. Performed at [facility], [address] [place], [place] [address]      Labs: Basic Metabolic Panel: Recent Labs  Lab 10/31/23 2026 11/01/23 0024 11/01/23 0426 11/01/23 0801 11/02/23 0545  NA 133* 135 136 136 136  K 3.4* 3.9 3.2* 3.6 3.4*  CL 106 107  109 107 103  CO2 22 18* 20* 22 26  GLUCOSE 145* 174* 156* 156* 146*  BUN 14 13 12 10 11   CREATININE 0.64 0.66 0.65 0.61 0.54  CALCIUM 8.7* 8.7* 8.5* 8.5* 8.4*   Liver Function Tests: Recent Labs  Lab 10/31/23 1106 10/31/23 1332  AST 17 16  ALT 15 13  ALKPHOS 127* 117  BILITOT 0.7 0.5  PROT 7.4 6.8  ALBUMIN 4.4 4.1   Recent Labs  Lab 10/31/23 1106  LIPASE 19   No results for input(s): "AMMONIA" in the last 168 hours. CBC: Recent Labs  Lab 10/31/23 1106 10/31/23 1341  WBC 7.2  --   NEUTROABS 6.2  --   HGB 12.2 11.6*  HCT 34.6* 34.0*  MCV 87.4  --   PLT 242  --    Cardiac Enzymes: No results for input(s): "CKTOTAL", "CKMB", "CKMBINDEX", "TROPONINI" in the last 168 hours. BNP: BNP (last 3 results) No results for input(s): "BNP" in the last 8760 hours.  ProBNP (last 3 results) No results for input(s): "PROBNP" in the last 8760 hours.  CBG: Recent Labs  Lab 11/01/23 1129 11/01/23 1237 11/01/23 1628 11/01/23 2127 11/02/23 0735  GLUCAP 274* 310* 191* 120* 176*       Signed:  Donnetta Gains MD.  [facility] 11/02/2023, 11:24 AM

## 2023-11-02 NOTE — Plan of Care (Signed)
  Problem: Education: Goal: Knowledge of General Education information will improve Description: Including pain rating scale, medication(s)/side effects and non-pharmacologic comfort measures Outcome: Progressing   Problem: Health Behavior/Discharge Planning: Goal: Ability to manage health-related needs will improve Outcome: Progressing   Problem: Clinical Measurements: Goal: Ability to maintain clinical measurements within normal limits will improve Outcome: Progressing Goal: Diagnostic test results will improve Outcome: Progressing   Problem: Activity: Goal: Risk for activity intolerance will decrease Outcome: Progressing   Problem: Nutrition: Goal: Adequate nutrition will be maintained Outcome: Progressing   Problem: Coping: Goal: Level of anxiety will decrease Outcome: Progressing   Problem: Safety: Goal: Ability to remain free from injury will improve Outcome: Progressing   Problem: Skin Integrity: Goal: Risk for impaired skin integrity will decrease Outcome: Progressing   Problem: Coping: Goal: Ability to adjust to condition or change in health will improve Outcome: Progressing   Problem: Health Behavior/Discharge Planning: Goal: Ability to manage health-related needs will improve Outcome: Progressing   Problem: Metabolic: Goal: Ability to maintain appropriate glucose levels will improve Outcome: Progressing   Problem: Nutritional: Goal: Maintenance of adequate nutrition will improve Outcome: Progressing   Problem: Tissue Perfusion: Goal: Adequacy of tissue perfusion will improve Outcome: Progressing

## 2023-11-02 NOTE — Telephone Encounter (Signed)
 Pharmacy Patient Advocate Encounter  Received notification from [employer] that Prior Authorization for FreeStyle Libre 3 Plus Sensor  has been APPROVED from 11/02/2023 to 06/28/2024. Ran test claim, Copay is $0.00. This test claim was processed through [facility]- copay amounts may vary at other pharmacies due to pharmacy/plan contracts, or as the patient moves through the different stages of their insurance plan.   PA #/Case ID/Reference #: [id number]

## 2023-11-02 NOTE — Progress Notes (Signed)
   11/02/23 1002  Spiritual Encounters  Type of Visit Attempt (pt unavailable)   Per spiritual consult request by clinical staff, I attempted to visit with Ms. [name] for support and to offer HCPOA.  At time of visit [name] was in consult with physician. Will follow up at earliest opportunity.  Manoah Deckard L. Minetta Aly, M.Div [phone]

## 2023-11-02 NOTE — Progress Notes (Signed)
   11/02/23 1141  TOC Brief Assessment  Insurance and Status Reviewed  Patient has primary care physician Yes  Home environment has been reviewed home w/ spouse  Prior level of function: independent  Prior/Current Home Services No current home services  Social Drivers of Health Review SDOH reviewed no interventions necessary  Readmission risk has been reviewed Yes  Transition of care needs no transition of care needs at this time

## 2023-11-02 NOTE — Plan of Care (Signed)
 Problem: Education: Goal: Knowledge of General Education information will improve Description: Including pain rating scale, medication(s)/side effects and non-pharmacologic comfort measures Outcome: Adequate for Discharge   Problem: Health Behavior/Discharge Planning: Goal: Ability to manage health-related needs will improve Outcome: Adequate for Discharge   Problem: Clinical Measurements: Goal: Ability to maintain clinical measurements within normal limits will improve Outcome: Adequate for Discharge Goal: Will remain free from infection Outcome: Adequate for Discharge Goal: Diagnostic test results will improve Outcome: Adequate for Discharge Goal: Respiratory complications will improve Outcome: Adequate for Discharge Goal: Cardiovascular complication will be avoided Outcome: Adequate for Discharge   Problem: Activity: Goal: Risk for activity intolerance will decrease Outcome: Adequate for Discharge   Problem: Nutrition: Goal: Adequate nutrition will be maintained Outcome: Adequate for Discharge   Problem: Coping: Goal: Level of anxiety will decrease Outcome: Adequate for Discharge   Problem: Elimination: Goal: Will not experience complications related to bowel motility Outcome: Adequate for Discharge Goal: Will not experience complications related to urinary retention Outcome: Adequate for Discharge   Problem: Pain Managment: Goal: General experience of comfort will improve and/or be controlled Outcome: Adequate for Discharge   Problem: Safety: Goal: Ability to remain free from injury will improve Outcome: Adequate for Discharge   Problem: Skin Integrity: Goal: Risk for impaired skin integrity will decrease Outcome: Adequate for Discharge   Problem: Education: Goal: Ability to describe self-care measures that may prevent or decrease complications (Diabetes Survival Skills Education) will improve Outcome: Adequate for Discharge Goal: Individualized Educational  Video(s) Outcome: Adequate for Discharge   Problem: Coping: Goal: Ability to adjust to condition or change in health will improve Outcome: Adequate for Discharge   Problem: Fluid Volume: Goal: Ability to maintain a balanced intake and output will improve Outcome: Adequate for Discharge   Problem: Health Behavior/Discharge Planning: Goal: Ability to identify and utilize available resources and services will improve Outcome: Adequate for Discharge Goal: Ability to manage health-related needs will improve Outcome: Adequate for Discharge   Problem: Metabolic: Goal: Ability to maintain appropriate glucose levels will improve Outcome: Adequate for Discharge   Problem: Nutritional: Goal: Maintenance of adequate nutrition will improve Outcome: Adequate for Discharge Goal: Progress toward achieving an optimal weight will improve Outcome: Adequate for Discharge   Problem: Skin Integrity: Goal: Risk for impaired skin integrity will decrease Outcome: Adequate for Discharge   Problem: Tissue Perfusion: Goal: Adequacy of tissue perfusion will improve Outcome: Adequate for Discharge   Problem: Education: Goal: Ability to describe self-care measures that may prevent or decrease complications (Diabetes Survival Skills Education) will improve Outcome: Adequate for Discharge Goal: Individualized Educational Video(s) Outcome: Adequate for Discharge   Problem: Cardiac: Goal: Ability to maintain an adequate cardiac output will improve Outcome: Adequate for Discharge   Problem: Health Behavior/Discharge Planning: Goal: Ability to identify and utilize available resources and services will improve Outcome: Adequate for Discharge Goal: Ability to manage health-related needs will improve Outcome: Adequate for Discharge   Problem: Fluid Volume: Goal: Ability to achieve a balanced intake and output will improve Outcome: Adequate for Discharge   Problem: Metabolic: Goal: Ability to maintain  appropriate glucose levels will improve Outcome: Adequate for Discharge   Problem: Nutritional: Goal: Maintenance of adequate nutrition will improve Outcome: Adequate for Discharge Goal: Maintenance of adequate weight for body size and type will improve Outcome: Adequate for Discharge   Problem: Respiratory: Goal: Will regain and/or maintain adequate ventilation Outcome: Adequate for Discharge   Problem: Urinary Elimination: Goal: Ability to achieve and maintain adequate renal perfusion and  functioning will improve Outcome: Adequate for Discharge

## 2023-11-04 ENCOUNTER — Encounter: Payer: Self-pay | Admitting: Internal Medicine

## 2023-11-04 ENCOUNTER — Ambulatory Visit: Admitting: Internal Medicine

## 2023-11-04 ENCOUNTER — Telehealth: Payer: Self-pay

## 2023-11-04 VITALS — BP 112/64 | HR 87 | Ht 63.0 in | Wt 138.8 lb

## 2023-11-04 DIAGNOSIS — E042 Nontoxic multinodular goiter: Secondary | ICD-10-CM | POA: Diagnosis not present

## 2023-11-04 DIAGNOSIS — E063 Autoimmune thyroiditis: Secondary | ICD-10-CM | POA: Diagnosis not present

## 2023-11-04 DIAGNOSIS — E1059 Type 1 diabetes mellitus with other circulatory complications: Secondary | ICD-10-CM

## 2023-11-04 NOTE — Telephone Encounter (Signed)
 Called and left a message to call the office to schedule appt with Dr. Marton Sleeper in 1-2 weeks. Future appts for chemo canceled.

## 2023-11-04 NOTE — Patient Instructions (Addendum)
 Please continue: - Lantus 12 units daily in am (if the sugars remain low, we may need to decrease to 10 units)  - Novolog - 15 min before meal: 3 units before a smaller meal 4 units before a regular meal 5 units before a large meal  Change the NovoLog sliding scale: 150-200: + 1 unit 201-250: + 2 units 251-300: + 3 units >301-350: + 4 units  Please continue Levothyroxine  75 mcg daily.  Take the thyroid  hormone every day, with water , at least 30 minutes before breakfast, separated by at least 4 hours from: - acid reflux medications - calcium - iron - multivitamins  Please return in 2 months.  PATIENT INSTRUCTIONS FOR DIABETES:  DIET AND EXERCISE Diet and exercise is an important part of diabetic treatment.  We recommended aerobic exercise in the form of brisk walking (working between 40-60% of maximal aerobic capacity, similar to brisk walking) for 150 minutes per week (such as 30 minutes five days per week) along with 3 times per week performing 'resistance' training (using various gauge rubber tubes with handles) 5-10 exercises involving the major muscle groups (upper body, lower body and core) performing 10-15 repetitions (or near fatigue) each exercise. Start at half the above goal but build slowly to reach the above goals. If limited by weight, joint pain, or disability, we recommend daily walking in a swimming pool with water  up to waist to reduce pressure from joints while allow for adequate exercise.    BLOOD GLUCOSES Monitoring your blood glucoses is important for continued management of your diabetes. Please check your blood glucoses 2-4 times a day: fasting, before meals and at bedtime (you can rotate these measurements - e.g. one day check before the 3 meals, the next day check before 2 of the meals and before bedtime, etc.).   HYPOGLYCEMIA (low blood sugar) Hypoglycemia is usually a reaction to not eating, exercising, or taking too much insulin/ other diabetes drugs.   Symptoms include tremors, sweating, hunger, confusion, headache, etc. Treat IMMEDIATELY with 15 grams of Carbs: 4 glucose tablets  cup regular juice/soda 2 tablespoons raisins 4 teaspoons sugar 1 tablespoon honey Recheck blood glucose in 15 mins and repeat above if still symptomatic/blood glucose <100.  RECOMMENDATIONS TO REDUCE YOUR RISK OF DIABETIC COMPLICATIONS: * Take your prescribed MEDICATION(S) * Follow a DIABETIC diet: Complex carbs, fiber rich foods, (monounsaturated and polyunsaturated) fats * AVOID saturated/trans fats, high fat foods, >2,300 mg salt per day. * EXERCISE at least 5 times a week for 30 minutes or preferably daily.  * DO NOT SMOKE OR DRINK more than 1 drink a day. * Check your FEET every day. Do not wear tightfitting shoes. Contact us  if you develop an ulcer * See your EYE doctor once a year or more if needed * Get a FLU shot once a year * Get a PNEUMONIA vaccine once before and once after age 70 years  GOALS:  * Your Hemoglobin A1c of <7%  * fasting sugars need to be 80-130 * after meals sugars need to be <180 (2h after you start eating) * Your Systolic BP should be 130 or lower  * Your Diastolic BP should be 80 or lower  * Your HDL (Good Cholesterol) should be 40 or higher  * Your LDL (Bad Cholesterol) should be ideally <70. * Your Triglycerides should be 150 or lower  * Your Urine microalbumin (kidney function) should be <30 * Your Body Mass Index should be 25 or lower   Please consider  the following ways to cut down carbs and fat and increase fiber and micronutrients in your diet: - substitute whole grain for white bread or pasta - substitute brown rice for white rice - substitute 90-calorie flat bread pieces for slices of bread when possible - substitute sweet potatoes or yams for white potatoes - substitute humus for margarine - substitute tofu for cheese when possible - substitute almond or rice milk for regular milk (would not drink soy milk  daily due to concern for soy estrogen influence on breast cancer risk) - substitute dark chocolate for other sweets when possible - substitute water  - can add lemon or orange slices for taste - for diet sodas (artificial sweeteners will trick your body that you can eat sweets without getting calories and will lead you to overeating and weight gain in the long run) - do not skip breakfast or other meals (this will slow down the metabolism and will result in more weight gain over time)  - can try smoothies made from fruit and almond/rice milk in am instead of regular breakfast - can also try old-fashioned (not instant) oatmeal made with almond/rice milk in am - order the dressing on the side when eating salad at a restaurant (pour less than half of the dressing on the salad) - eat as little meat as possible - can try juicing, but should not forget that juicing will get rid of the fiber, so would alternate with eating raw veg./fruits or drinking smoothies - use as little oil as possible, even when using olive oil - can dress a salad with a mix of balsamic vinegar and lemon juice, for e.g. - use agave nectar, stevia sugar, or regular sugar rather than artificial sweateners - steam or broil/roast veggies  - snack on veggies/fruit/nuts (unsalted, preferably) when possible, rather than processed foods - reduce or eliminate aspartame in diet (it is in diet sodas, chewing gum, etc) Read the labels!

## 2023-11-04 NOTE — Telephone Encounter (Signed)
 Scheduled appt on 5/20 per her request. She is aware of appt.

## 2023-11-04 NOTE — Progress Notes (Signed)
 Patient ID: Lisa  MYLEKA Zavala, female   DOB: 06/07/1951, 73 y.o.   MRN: [id number]  HPI  Lisa  D Zavala is a 73 y.o.-year-old female, initially referred by Dr [name] for management of hypothyroidism, diagnosed as secondary to Hashimoto's thyroiditis, and also thyroid  nodules, diagnosed 11/2021, returning for follow-up for these conditions but also for newly diagnosed type 1 diabetes, dx'ed 10/2023.  Last visit 11 months ago. She is here with her husband.  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 She started ChTx last spring.  She then started Keytruda . She was admitted in DKA 10/31/2023.  At that time, she had nausea/vomiting/diarrhea and she was diagnosed with colitis. She was started on basal-bolus insulin regimen.  She was also started on a CGM.  Keytruda  was stopped.  DM1: - Diagnosed 4 days ago after an admission for DKA in the setting of colitis.  She was on Keytruda  at that time but this was stopped.  Reviewed hbA1c Levels: Lab Results  Component Value Date   HGBA1C 7.8 (H) 10/31/2023   HGBA1C 7.5 (H) 10/31/2023   HGBA1C 5.5 10/21/2011   She is on: - Lantus 12 units daily - Novolog 5 units before each meal + sliding scale  Meter: AccuChek  Pt checks her sugars more than 4 times a day with her CGM:  Lowest sugar was 84; she has hypoglycemia awareness at 70.  She does not a glucagon kit at home.  Highest sugar was 320. No previous DKA admissions.    Pt's meals are: - Breakfast: Cookie or cracker - Lunch: Leftovers or soup or salad or veggies - Dinner: Meat and veggies - Snacks: sweets  Drinks water  or tea/lemonade.  She has a dry mouth.  - no CKD, last BUN/creatinine:  Lab Results  Component Value Date   BUN 11 11/02/2023   BUN 10 11/01/2023   CREATININE 0.54 11/02/2023   CREATININE 0.61 11/01/2023   No results found for:  "MICRALBCREAT"  - No HL: Last set of lipids: Lab Results  Component Value Date   CHOL 161 12/14/2016   HDL 71 12/14/2016   LDLCALC 77 12/14/2016   TRIG 65 12/14/2016   CHOLHDL 2.3 12/14/2016  She is not on a statin.  - last eye exam was in 08/2023. No DR reportedly.   - no numbness and tingling in her feet.  She does not have a podiatrist.  She has family history of type 1 diabetes in her sister.  Hypothyroidism: Reviewed history: Patient was diagnosed with hypothyroidism in 10/2014 and started on levothyroxine  50 mcg daily.   We increased the dose to 75 mcg daily in 11/2021. She is currently on 100 mcg daily.  She takes her levothyroxine : - in am (~4 am) - fasting - at least 1h from + b'fast and coffee + creamer - Ca, vit D >4h after LT4 - no Fe, + MVI at dinnertime - no PPIs - stopped Biotin   Reviewed her TFTs: Lab Results  Component Value Date   TSH 0.780 10/31/2023   TSH 0.803 10/19/2023   TSH 0.812 08/27/2023   TSH 0.951 07/13/2023   TSH 5.086 (H) 06/01/2023   TSH 7.198 (H) 05/06/2023   TSH 0.044 (L) 01/15/2023   TSH 1.119 11/30/2022   TSH 2.69 01/19/2022   TSH 4.67 12/01/2021   FREET4 0.81 01/19/2022   FREET4 0.78 12/01/2021   FREET4 0.80 12/02/2020  FREET4 1.02 01/08/2020   FREET4 0.88 11/28/2018   FREET4 0.90 12/13/2017   FREET4 0.87 12/04/2016   FREET4 0.95 08/28/2016   FREET4 1.01 10/18/2015   FREET4 1.05 04/15/2015    Patient's TPO antibodies were elevated, confirming Hashimoto's thyroiditis Component     Latest Ref Rng 04/15/2015  Thyroperoxidase Ab SerPl-aCnc     <9 IU/mL 151 (H)  Thyroglobulin Ab     <2 IU/mL <1   Thyroid  nodule: - Initially detected on palpation - Reviewed previous investigation:  Thyroid  U/S (12/17/2021): Parenchymal Echotexture: Moderately heterogenous  Isthmus: 3 mm  Right lobe: 4.5 x 2.1 x 1.4 cm  Left lobe: 3.9 x 1.2 x 1.2 cm _________________________________________________________   Nodule # 1:   Location: Isthmus; right  Maximum size: 1.3 cm; Other 2 dimensions: 1.1 x 0.6 cm  Composition: solid/almost completely solid (2)  Echogenicity: hypoechoic (2)  ACR TI-RADS total points: 4.  *Given size (>/= 1 - 1.4 cm) and appearance, a follow-up ultrasound in 1 year should be considered based on TI-RADS criteria.  _________________________________________________________   Additional subcentimeter hypoechoic nodule in the left thyroid  superiorly measures only 6 mm. This would not meet criteria for any biopsy or follow-up and is not fully detailed by TI rads criteria.   Moderate thyroid  heterogeneity suggesting component of medical thyroid  disease. Correlate with thyroid  function tests. No hypervascularity. No regional adenopathy.   IMPRESSION: 1.3 cm right isthmus TR 4 nodule meets criteria for follow-up in 1 year.   Other findings as above.  Pt denies: - feeling nodules in neck - hoarseness - dysphagia - choking  No FH of thyroid  cancer. No h/o radiation tx to head or neck. No herbal supplements. No Biotin use. No recent steroids use.   She has knee pain.  In 04/2021, she fell and hurt her elbow while playing tennis.  She had a fracture and joint effusion.  This resolved. She was seeing Dr [name] (rheum).  She was investigated for possible autoimmune disease in the past, but she does not have a clear diagnosis. She was playing tennis consistently and playing in tournaments, but now on hold. She had THR in 11/2015.  ROS: + see HPI  I reviewed pt's medications, allergies, PMH, social hx, family hx, and changes were documented in the history of present illness. Otherwise, unchanged from my initial visit note.   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   Top of Left Hand(Tx p Bx)   Past Surgical History:  Procedure Laterality Date   CARPAL TUNNEL RELEASE     BILAT     CATARACT EXTRACTION     BILAT    ECTOPIC PREGNANCY SURGERY     in the setting of prior BTL, lapartomy   EYE SURGERY     IR IMAGING GUIDED PORT INSERTION  12/02/2022   KNEE CARTILAGE SURGERY  RIGHT    (MENISCUS)   LAPAROTOMY N/A 02/16/2023   Procedure: MINI LAPAROTOMY;  Surgeon: Derrel Flies, MD;  Location: [facility];  Service: Gynecology;  Laterality: N/A;   LYMPH NODE DISSECTION Right 02/16/2023   Procedure: RIGHT PELVIC LYMPH NODE DISSECTION;  Surgeon: Derrel Flies, MD;  Location: [facility];  Service: Gynecology;  Laterality: Right;   MOHS SURGERY     left nose/inner eye for basal cell   TONSILLECTOMY     TOTAL HIP ARTHROPLASTY Left 12/19/2015   TOTAL HIP ARTHROPLASTY Left 12/19/2015   Procedure: LEFT TOTAL HIP ARTHROPLASTY;  Surgeon: Shirlee Dotter, MD;  Location: [facility];  Service: Orthopedics;  Laterality: Left;   TRIGGER FINGER RELEASE     TUBAL LIGATION     Social History   Social History   Marital Status: Married    Spouse Name: N/A   Number of Children: 2   Occupational History    retired Programmer, systems, now Interior and spatial designer of preschool at [employer]   Social History Main Topics   Smoking status: Never Smoker    Smokeless tobacco: Not on file   Alcohol  Use: 0.0 oz/week    0 Standard drinks or equivalent per week     Comment: Rare   Drug Use: No   Current Outpatient Medications on File Prior to Visit  Medication Sig Dispense Refill   acetaminophen  (TYLENOL ) 500 MG tablet Take 500-1,000 mg by mouth every 6 (six) hours as needed (for muscle pain or soreness).     aspirin EC  81 MG tablet Take 81 mg by mouth at bedtime. Swallow whole.     Blood Glucose Monitoring Suppl (BLOOD GLUCOSE MONITOR SYSTEM) w/Device KIT Use to check blood sugar three times daily 1 kit 0   calcium carbonate (TUMS) 500 MG chewable tablet Chew 1 tablet by mouth daily.     Carboxymethylcellulose Sodium (THERATEARS OP) Place 1 drop into both eyes 3 (three) times daily.     Cholecalciferol (VITAMIN D ) 50 MCG (2000 UT) tablet Take 2,000 Units by mouth daily.     diclofenac  sodium (VOLTAREN ) 1 % GEL Apply 2-4 g topically 4 (four) times daily. (Patient taking differently: Apply 2-4 g topically 4 (four) times daily as needed (for muscle pain or soreness).) 3 Tube 3   gabapentin  (NEURONTIN ) 100 MG capsule Take 100-200 mg by mouth daily as needed (for hot flashes).     gabapentin  (NEURONTIN ) 300 MG capsule Take 300 mg by mouth daily as needed (for hot flashes not relieved by the 100-200 mg dose).     Glucose Blood (BLOOD GLUCOSE TEST STRIPS) STRP Use to check blood sugar three times daily 100 strip 0   insulin aspart (NOVOLOG) 100 UNIT/ML FlexPen Inject 5 Units into the skin 3 (three) times daily with meals. If eating and Blood Glucose (BG) 80 or higher inject 5 units for meal coverage and add correction dose per scale. If not eating, correction dose only. BG <150= 0 unit; BG 150-200= 1 unit; BG 201-250= 2 unit; BG 251-300= 3 unit; BG 301-350= 4 unit; BG 351-400= 5 unit; BG >400= 6 unit and Call Primary Care. 15 mL 0   insulin glargine (LANTUS) 100 UNIT/ML Solostar Pen Inject 12 Units into the skin daily. 15 mL 1   Insulin Pen Needle (PEN NEEDLES) 31G X 5 MM MISC Use to inject insulin as directed 100 each 0   Lancet Device MISC 1 each by Does not apply route 3 (three) times daily. May dispense any manufacturer covered by patient's  insurance. 1 each 0   Lancets MISC Use to check blood sugar three times daily 100 each 0   levothyroxine  (SYNTHROID ) 100 MCG tablet Take 1 tablet (100 mcg total) by mouth daily.  (Patient taking differently: Take 100 mcg by mouth daily before breakfast.) 60 tablet 3   lidocaine -prilocaine  (EMLA ) cream Apply 1 Application topically as needed. (Patient taking differently: Apply 1 Application topically as needed (for port access).) 30 g 3   melatonin 5 MG TABS Take 5 mg by mouth at bedtime as needed (sleep).     Multiple Vitamin (MULTIVITAMIN) tablet Take 1 tablet by mouth daily with breakfast.     No current facility-administered medications on file prior to visit.   Allergies  Allergen Reactions   Sulfa Antibiotics Swelling and Other (See Comments)    Caused tics and the tongue became swollen for approx a week   Family History  Problem Relation Age of Onset   Uterine cancer Mother 45   Heart attack Mother    Hypertension Mother    Kidney cancer Father 74   Lung cancer Father    Diabetes Sister    Thyroid  disease Neg Hx    Colon cancer Neg Hx    Breast cancer Neg Hx    Ovarian cancer Neg Hx    Endometrial cancer Neg Hx    Pancreatic cancer Neg Hx    Prostate cancer Neg Hx    PE: BP 112/64   Pulse 87   Ht 5\' 3"  (1.6 m)   Wt 138 lb 12.8 oz (63 kg)   SpO2 98%   BMI 24.59 kg/m    Wt Readings from Last 3 Encounters:  11/04/23 138 lb 12.8 oz (63 kg)  10/31/23 132 lb 15 oz (60.3 kg)  10/19/23 141 lb 8 oz (64.2 kg)   Constitutional: normal weight, in NAD Eyes: EOMI, no exophthalmos ENT: + ~1.5 cm right-sided thyroid  nodule palpated, no cervical lymphadenopathy Cardiovascular: RRR, No MRG, enlarged R thigh  Respiratory: CTA B Musculoskeletal: no deformities Skin: no rashes, + very tanned Neurological: no tremor with outstretched hands  ASSESSMENT: Insulin-dependent diabetes with complications: - New diagnosis, after she presented in DKA - Aortic atherosclerosis on CT scan (10/31/2023)  Component     Latest Ref Rng 10/31/2023  Glucose-Capillary     70 - 99 mg/dL 425 (H)   C-Peptide     1.1 - 4.4 ng/mL 0.2 (L)     2. Hypothyroidism due to  Hashimoto's thyroiditis  3.  Right thyroid  nodule  PLAN:  Insulin-dependent diabetes complicated by DKA - Likely developed in the setting of anti-PD-1 inhibitor for metastatic primary gynecological cancer treatment - Keytruda  now held. Unfortunately, this type of diabetes is unusually not reversible. - she presented in DKA several days ago.  At that time, C-peptide was low, at 0.2, confirming type 1 diabetes.  Antipancreatic antibodies were drawn in the hospital and they are now pending. - she is currently on Lantus and NovoLog basal-bolus insulin regimen - Fortunately, she was able to start a freestyle libre 3 CGM -she only started this 2 days ago so we do not have enough data to detect trends.  Sugars initially went up the first night home, and they improved afterwards.  They were again elevated this morning and she took 8 units of insulin to eat a banana and also for correction of the high blood sugars.  She did drop her blood sugars during the visit to the 50s and she ate a candy and  we also gave her a regular soda.  Sugars improved before the patient left the clinic.  We did discuss about changing her regimen to adjust the doses of NovoLog down due to her increased insulin sensitivity.  I recommended that instead of 5 units before every meal, to adjust the dose based on the size of her meals (I did not give her anything to carb ratio on purpose for now to avoid complicating her regimen).  I recommended 3 units before a smaller meal, 4 units before regular meal, and 5 units before a larger meal.  We discussed that if she continues to have lows after decreasing the doses of NovoLog, to also decrease the dose of Lantus. -I also gave her a new sliding scale, more permissive -We discussed about bolusing NovoLog 15 minutes before a meal but to reduce the lag time if the sugars are low before a meal -She also has questions about how to manage her blood sugars during the tennis tournament that is coming up  for her.  We discussed about reducing the dose of mealtime insulin if she exercises after a meal, but otherwise, to try to start exercising with the blood sugars between 150 and slightly lower than 200.  I advised her to check her sensor approximately every 30 minutes and follow the arrows.  She may need to have 15 g of carbs if the arrows are trending down or if the sugars are low.  We also discussed about using protein + fat along with sugars (maybe a protein bar) to maintain her blood sugars during exercise.  I did advise her that after certain types of exercise (cardio or strength training), sugars may trend up. -We discussed about checking blood sugars with the meter rather than glucometer when trying to correct a low blood sugar or high blood sugar -Discussed about target for blood sugars in the fasting and postprandial state and also target for A1c - I gave her a reference book for further information about type 1 diabetes: "Think Like a Pancreas" by Roselle Conner -I advised her to: Patient Instructions  Please continue: - Lantus 12 units daily in am (if the sugars remain low, we may need to decrease to 10 units)  - Novolog - 15 min before meal: 3 units before a smaller meal 4 units before a regular meal 5 units before a large meal  Change the NovoLog sliding scale: 150-200: + 1 unit 201-250: + 2 units 251-300: + 3 units >301-350: + 4 units  Please continue Levothyroxine  75 mcg daily.  Take the thyroid  hormone every day, with water , at least 30 minutes before breakfast, separated by at least 4 hours from: - acid reflux medications - calcium - iron - multivitamins  Please return in 2 months.  - advised to continue to check her blood sugars with the CGM - return to clinic in 2 months  2. Patient with history of autoimmune hypothyroidism, on levothyroxine  therapy - latest thyroid  labs reviewed with pt. >> normal:  Lab Results  Component Value Date   TSH 0.780 10/31/2023  -  she continues on LT4 100 mcg daily - pt feels good on this dose. - we discussed about taking the thyroid  hormone every day, with water , >30 minutes before breakfast, separated by >4 hours from acid reflux medications, calcium, iron, multivitamins. Pt. is taking it correctly.  3.  Right thyroid  nodule - Detected on palpation - no neck compression symptoms - We checked a thyroid  ultrasound at last visit  and this showed a 1.3 right, solid, hypoechoic, isthmic nodule which qualified for 1 year follow-up.  She also had a left 6 mm nodule which did not require follow-up. - A PET scan showed that the whole thyroid  is an increased uptake, consistent with her diagnosis of Hashimoto's thyroiditis, but the thyroid  nodules were not necessarily FDG avid - Will continue to keep an eye on this nodule and plan to repeat another ultrasound later this year  - time spent for the visit: 35 minutes, in precharting, postcharting, reviewing her previous labs,  evaluations, office visit notes, and previous treatments, discussing with patient and her husband, counseling her about her endocrine conditions (please see the discussed topics above), and developing a plan to investigate and treat them. She and her husband had a number of questions which I addressed.    Emilie Harden, MD PhD [facility] Endocrinology

## 2023-11-05 ENCOUNTER — Other Ambulatory Visit: Payer: Self-pay

## 2023-11-15 ENCOUNTER — Encounter: Payer: Self-pay | Admitting: Hematology and Oncology

## 2023-11-15 ENCOUNTER — Encounter: Payer: Self-pay | Admitting: Internal Medicine

## 2023-11-16 ENCOUNTER — Ambulatory Visit: Admitting: Hematology and Oncology

## 2023-11-16 LAB — C-PEPTIDE: C-Peptide: 0.2 ng/mL — ABNORMAL LOW (ref 1.1–4.4)

## 2023-11-16 LAB — IA-2 AUTOANTIBODIES: IA-2 Autoantibodies: <7.5 U/mL

## 2023-11-16 LAB — ZNT8 ANTIBODIES: ZNT8 Antibodies: <15 U/mL

## 2023-11-16 LAB — GLUTAMIC ACID DECARBOXYLASE AUTO ABS: Glutamic Acid Decarb Ab: <5 U/mL (ref 0.0–5.0)

## 2023-11-19 ENCOUNTER — Inpatient Hospital Stay: Attending: Gynecologic Oncology | Admitting: Hematology and Oncology

## 2023-11-19 ENCOUNTER — Inpatient Hospital Stay

## 2023-11-19 ENCOUNTER — Encounter: Payer: Self-pay | Admitting: Hematology and Oncology

## 2023-11-19 VITALS — BP 108/67 | HR 73 | Temp 97.8°F | Resp 18 | Ht 63.0 in | Wt 138.0 lb

## 2023-11-19 DIAGNOSIS — Z79899 Other long term (current) drug therapy: Secondary | ICD-10-CM | POA: Diagnosis not present

## 2023-11-19 DIAGNOSIS — C55 Malignant neoplasm of uterus, part unspecified: Secondary | ICD-10-CM | POA: Insufficient documentation

## 2023-11-19 DIAGNOSIS — E039 Hypothyroidism, unspecified: Secondary | ICD-10-CM | POA: Diagnosis not present

## 2023-11-19 DIAGNOSIS — E0910 Drug or chemical induced diabetes mellitus with ketoacidosis without coma: Secondary | ICD-10-CM

## 2023-11-19 LAB — CBC WITH DIFFERENTIAL (CANCER CENTER ONLY)
Abs Immature Granulocytes: 0.01 10*3/uL (ref 0.00–0.07)
Basophils Absolute: 0.1 10*3/uL (ref 0.0–0.1)
Basophils Relative: 1 %
Eosinophils Absolute: 0.2 10*3/uL (ref 0.0–0.5)
Eosinophils Relative: 2 %
HCT: 32.5 % — ABNORMAL LOW (ref 36.0–46.0)
Hemoglobin: 11.2 g/dL — ABNORMAL LOW (ref 12.0–15.0)
Immature Granulocytes: 0 %
Lymphocytes Relative: 24 %
Lymphs Abs: 1.5 10*3/uL (ref 0.7–4.0)
MCH: 30.5 pg (ref 26.0–34.0)
MCHC: 34.5 g/dL (ref 30.0–36.0)
MCV: 88.6 fL (ref 80.0–100.0)
Monocytes Absolute: 0.5 10*3/uL (ref 0.1–1.0)
Monocytes Relative: 7 %
Neutro Abs: 4.2 10*3/uL (ref 1.7–7.7)
Neutrophils Relative %: 66 %
Platelet Count: 244 10*3/uL (ref 150–400)
RBC: 3.67 MIL/uL — ABNORMAL LOW (ref 3.87–5.11)
RDW: 13.5 % (ref 11.5–15.5)
WBC Count: 6.4 10*3/uL (ref 4.0–10.5)
nRBC: 0 % (ref 0.0–0.2)

## 2023-11-19 LAB — CMP (CANCER CENTER ONLY)
ALT: 11 U/L (ref 0–44)
AST: 14 U/L — ABNORMAL LOW (ref 15–41)
Albumin: 4.1 g/dL (ref 3.5–5.0)
Alkaline Phosphatase: 96 U/L (ref 38–126)
Anion gap: 6 (ref 5–15)
BUN: 16 mg/dL (ref 8–23)
CO2: 30 mmol/L (ref 22–32)
Calcium: 9.3 mg/dL (ref 8.9–10.3)
Chloride: 99 mmol/L (ref 98–111)
Creatinine: 0.82 mg/dL (ref 0.44–1.00)
GFR, Estimated: >60 mL/min (ref >60)
Glucose, Bld: 215 mg/dL — ABNORMAL HIGH (ref 70–99)
Potassium: 3.8 mmol/L (ref 3.5–5.1)
Sodium: 135 mmol/L (ref 135–145)
Total Bilirubin: 0.6 mg/dL (ref 0.0–1.2)
Total Protein: 6.9 g/dL (ref 6.5–8.1)

## 2023-11-19 LAB — TSH: TSH: 2.14 u[IU]/mL (ref 0.350–4.500)

## 2023-11-19 MED ORDER — SODIUM CHLORIDE 0.9% FLUSH
10.0000 mL | Freq: Once | INTRAVENOUS | Status: AC
  Administered 2023-11-19: 10 mL

## 2023-11-19 MED ORDER — HEPARIN SOD (PORK) LOCK FLUSH 100 UNIT/ML IV SOLN
500.0000 [IU] | Freq: Once | INTRAVENOUS | Status: AC
  Administered 2023-11-19: 500 [IU]

## 2023-11-19 NOTE — Assessment & Plan Note (Addendum)
 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, responded very well with complete response with neoadjuvant chemotherapy with combination of carboplatin , paclitaxel  and pembrolizumab  followed by surgery and maintenance immunotherapy with pembrolizumab  Er neg, MSI stable, PD-L1 80%   Her recent treatment course was complicated by development of insulin -dependent diabetes and hospitalization due to DKA She is still having difficulties controlling her blood sugar I explained to the patient why her treatment is placed on hold She will have her port flush with labs today and again in July If she is not better by July, I plan to repeat imaging study in August

## 2023-11-19 NOTE — Progress Notes (Signed)
 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, responded very well with complete response with neoadjuvant chemotherapy with combination of carboplatin , paclitaxel  and pembrolizumab  followed by surgery and maintenance immunotherapy with pembrolizumab  Er neg, MSI stable, PD-L1 80%   Her recent treatment course was complicated by development of insulin -dependent diabetes and hospitalization due to DKA She is still having difficulties controlling her blood sugar I explained to the patient why her treatment is placed on hold She will have her port flush with labs today and again in July If she is not better by July, I plan to repeat imaging study in August Acquired hypothyroidism We will recheck TSH today 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) She has developed rare complication of checkpoint inhibitors with insulin -dependent diabetes She has difficulties getting her blood sugar under control due to the changes in her physical activity and diet She will continue to work with her endocrinologist to get her blood sugar under control  Orders Placed This Encounter  Procedures   CMP (Cancer Center only)    Standing Status:   Future    Number of Occurrences:   1    Expiration Date:   11/18/2024   CBC with Differential (Cancer Center Only)    Standing Status:   Future    Number of Occurrences:   1    Expiration Date:   11/18/2024   TSH    Standing Status:   Future    Number of Occurrences:   1    Expiration Date:   11/18/2024   CBC with Differential/Platelet    Standing Status:   Standing    Number of Occurrences:   22    Expiration  Date:   11/18/2024   Comprehensive metabolic panel with GFR    Standing Status:   Standing    Number of Occurrences:   33    Expiration Date:   11/18/2024     Lisa Jacobs, MD  INTERVAL HISTORY: she returns for surveillance follow-up She was recently discharged after development of DKA and uncontrolled diabetes I reviewed her blood sugar monitor and noted difficulties in getting her blood sugar under control She is attempting to modify her diet and exercise She is working with her endocrinologist I reviewed recent imaging study with the patient and discussed future plan of care  PHYSICAL EXAMINATION: ECOG PERFORMANCE STATUS: 1 - Symptomatic but completely ambulatory  Vitals:   11/19/23 1237  BP: 108/67  Pulse: 73  Resp: 18  Temp: 97.8 F (36.6 C)  SpO2: 100%   Filed Weights   11/19/23 1237  Weight: 138 lb (62.6 kg)    Relevant data reviewed during this visit included CBC, CMP, CT imaging from May 2025

## 2023-11-19 NOTE — Assessment & Plan Note (Addendum)
 She has developed rare complication of checkpoint inhibitors with insulin -dependent diabetes She has difficulties getting her blood sugar under control due to the changes in her physical activity and diet She will continue to work with her endocrinologist to get her blood sugar under control

## 2023-11-23 ENCOUNTER — Ambulatory Visit (HOSPITAL_BASED_OUTPATIENT_CLINIC_OR_DEPARTMENT_OTHER)

## 2023-11-23 ENCOUNTER — Telehealth: Payer: Self-pay

## 2023-11-23 ENCOUNTER — Inpatient Hospital Stay

## 2023-11-23 NOTE — Telephone Encounter (Signed)
 Called and left below message. Ask her to call the office for questions. ?

## 2023-11-23 NOTE — Telephone Encounter (Signed)
-----   Message from Almeda Jacobs sent at 11/23/2023  8:59 AM EDT ----- TSH was fine, let her know

## 2023-11-26 MED ORDER — FREESTYLE LIBRE 3 PLUS SENSOR MISC: 1.0000 | 3 refills | Status: AC

## 2023-11-30 ENCOUNTER — Other Ambulatory Visit

## 2023-11-30 ENCOUNTER — Ambulatory Visit: Admitting: Hematology and Oncology

## 2023-11-30 ENCOUNTER — Ambulatory Visit

## 2023-12-13 ENCOUNTER — Ambulatory Visit: Admitting: Physician Assistant

## 2023-12-13 ENCOUNTER — Encounter: Payer: Self-pay | Admitting: Physician Assistant

## 2023-12-13 ENCOUNTER — Other Ambulatory Visit (INDEPENDENT_AMBULATORY_CARE_PROVIDER_SITE_OTHER): Payer: Self-pay

## 2023-12-13 DIAGNOSIS — M19011 Primary osteoarthritis, right shoulder: Secondary | ICD-10-CM

## 2023-12-13 DIAGNOSIS — M25511 Pain in right shoulder: Secondary | ICD-10-CM | POA: Diagnosis not present

## 2023-12-13 MED ORDER — MELOXICAM 7.5 MG PO TABS: 7.5000 mg | ORAL_TABLET | Freq: Every day | ORAL | 1 refills | Status: AC

## 2023-12-13 NOTE — Progress Notes (Signed)
 Office Visit Note   Patient: [name]           Date of Birth: Nov 05, 1950           MRN: [id number] Visit Date: 12/13/2023              Requested by: Rada Buerger, MD [id number] [place],  [place] [address] PCP: Rada Buerger, MD   Assessment & Plan: Visit Diagnoses:  1. Acute pain of right shoulder   2. Primary osteoarthritis of right shoulder     Plan: Patient may benefit from an intra-articular injection right shoulder in the future.  However given her poorly controlled diabetes at this point in time would not recommend this.  Therefore refill on her Mobic is given.  She will use Voltaren  gel over the shoulder and also Tylenol .  Offered formal therapy she would like to try some exercises at home on her own.  She will follow-up with us  on an as-needed basis pain persist or becomes worse.  Follow-Up Instructions: Return if symptoms worsen or fail to improve.   Orders:  Orders Placed This Encounter  Procedures   XR Shoulder Right   Meds ordered this encounter  Medications   meloxicam (MOBIC) 7.5 MG tablet    Sig: Take 1 tablet (7.5 mg total) by mouth daily.    Dispense:  30 tablet    Refill:  1      Procedures: No procedures performed   Clinical Data: No additional findings.   Subjective: Chief Complaint  Patient presents with   Right Shoulder - Pain    HPI [name]'s well-known Dr. Alvester Zavala service comes in today with right shoulder pain for the past month.  This happened after being involved in a tennis tournament.  She states stiffness constant achiness in the shoulder.  Pain is worse with reaching behind her or overhead.  She states anything below chest level is good.  No numbness tingling down the arm.  She states it feels like a bad flu shot.  Achy shoulder pain which radiates down into the biceps area at times.  She has tried Tylenol  and Advil  along with some Mobic intermittently.  States these are beneficial.  She has been  recently diagnosed with diabetes type 1.  She is having difficulty controlling her diabetes. Review of Systems See HPI  Objective: Vital Signs: There were no vitals taken for this visit.  Physical Exam General: Well-developed well-nourished female no acute distress mood and affect appropriate Respirations: Unlabored   Ortho Exam Bilateral shoulders: 5 out of 5 strength with external and internal rotation against resistance.  Slight weakness with empty can test on the right negative on the left.  Liftoff test negative bilaterally.  Biceps intact distally.  Bilateral 5 strength biceps bilaterally.  Internal/external rotation bilateral shoulders reveals significant crepitus and mild crepitus left shoulder.  Overhead range of motion right shoulder is painful. Specialty Comments:  No specialty comments available.  Imaging: XR Shoulder Right Result Date: 12/13/2023 Right shoulder 3 views: Shoulder is well located.  Slightly low riding right shoulder with periarticular spurring off the humeral head.  No acute fracture.  Moderate to moderately severe narrowing glenohumeral joint on the axillary view.    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   Family history of uterine cancer    Family history of kidney cancer    Hot flashes 01/15/2023   Multiple thyroid  nodules 12/21/2022   Acute deep vein thrombosis (DVT) of right lower extremity (HCC) 11/20/2022   Mild anemia 11/20/2022   Uterine cancer (HCC) 11/19/2022   Unilateral primary osteoarthritis, left knee 08/05/2022   Unilateral primary osteoarthritis, right knee 08/05/2022   Bilateral primary osteoarthritis of knee 05/06/2022   Primary osteoarthritis of  left hip 12/19/2015   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   Hypothyroidism due to Hashimoto's thyroiditis 10/15/2015   Degenerative disc disease    Arthritis    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   Top of Left Hand(Tx p Bx)    Family History  Problem Relation Age of Onset   Uterine cancer Mother 40   Heart attack Mother    Hypertension Mother    Kidney cancer Father 63   Lung cancer Father    Diabetes Sister    Thyroid  disease Neg Hx    Colon cancer Neg Hx    Breast cancer Neg Hx    Ovarian cancer Neg Hx    Endometrial cancer Neg Hx    Pancreatic cancer Neg Hx    Prostate cancer Neg Hx     Past Surgical History:  Procedure Laterality Date   CARPAL TUNNEL RELEASE     BILAT     CATARACT EXTRACTION     BILAT    ECTOPIC PREGNANCY SURGERY     in the setting of prior BTL, lapartomy   EYE SURGERY     IR IMAGING GUIDED PORT INSERTION  12/02/2022   KNEE CARTILAGE SURGERY     RIGHT    (MENISCUS)   LAPAROTOMY N/A 02/16/2023   Procedure: MINI LAPAROTOMY;  Surgeon: Derrel Flies, MD;  Location: [facility];  Service: Gynecology;  Laterality: N/A;   LYMPH NODE DISSECTION Right 02/16/2023   Procedure: RIGHT PELVIC LYMPH NODE DISSECTION;  Surgeon: Derrel Flies, MD;  Location: [facility];  Service: Gynecology;  Laterality: Right;   MOHS SURGERY     left nose/inner eye for basal cell   TONSILLECTOMY     TOTAL HIP ARTHROPLASTY Left 12/19/2015   TOTAL HIP  ARTHROPLASTY Left 12/19/2015   Procedure: LEFT TOTAL HIP ARTHROPLASTY;  Surgeon: Shirlee Dotter, MD;  Location: [facility];  Service: Orthopedics;  Laterality: Left;   TRIGGER FINGER RELEASE     TUBAL LIGATION     Social History   Occupational History   Not on file  Tobacco Use   Smoking status: Never   Smokeless tobacco: Never  Vaping Use   Vaping status: Never Used  Substance and Sexual Activity   Alcohol  use: Yes    Alcohol /week: 0.0 standard drinks of alcohol     Comment:  Rare   Drug use: No   Sexual activity: Yes    Birth control/protection: Surgical, Post-menopausal    Comment: Tubal lig-1st intercourse 73 yo-Fewer than 5 partners

## 2023-12-21 ENCOUNTER — Ambulatory Visit: Payer: Medicare PPO | Admitting: Internal Medicine

## 2024-01-03 ENCOUNTER — Ambulatory Visit: Admitting: Psychiatry

## 2024-01-10 ENCOUNTER — Other Ambulatory Visit: Payer: Self-pay | Admitting: Hematology and Oncology

## 2024-01-10 ENCOUNTER — Encounter: Payer: Self-pay | Admitting: Oncology

## 2024-01-10 ENCOUNTER — Inpatient Hospital Stay: Attending: Gynecologic Oncology | Admitting: Psychiatry

## 2024-01-10 ENCOUNTER — Inpatient Hospital Stay

## 2024-01-10 ENCOUNTER — Encounter: Payer: Self-pay | Admitting: Psychiatry

## 2024-01-10 VITALS — BP 109/61 | HR 66 | Temp 98.6°F | Resp 19 | Wt 137.4 lb

## 2024-01-10 DIAGNOSIS — C55 Malignant neoplasm of uterus, part unspecified: Secondary | ICD-10-CM

## 2024-01-10 DIAGNOSIS — R600 Localized edema: Secondary | ICD-10-CM | POA: Insufficient documentation

## 2024-01-10 DIAGNOSIS — R590 Localized enlarged lymph nodes: Secondary | ICD-10-CM

## 2024-01-10 DIAGNOSIS — Z780 Asymptomatic menopausal state: Secondary | ICD-10-CM | POA: Diagnosis not present

## 2024-01-10 DIAGNOSIS — Z79899 Other long term (current) drug therapy: Secondary | ICD-10-CM | POA: Diagnosis not present

## 2024-01-10 DIAGNOSIS — E099 Drug or chemical induced diabetes mellitus without complications: Secondary | ICD-10-CM | POA: Diagnosis not present

## 2024-01-10 DIAGNOSIS — C778 Secondary and unspecified malignant neoplasm of lymph nodes of multiple regions: Secondary | ICD-10-CM | POA: Insufficient documentation

## 2024-01-10 DIAGNOSIS — I890 Lymphedema, not elsewhere classified: Secondary | ICD-10-CM | POA: Insufficient documentation

## 2024-01-10 DIAGNOSIS — R61 Generalized hyperhidrosis: Secondary | ICD-10-CM

## 2024-01-10 DIAGNOSIS — G5781 Other specified mononeuropathies of right lower limb: Secondary | ICD-10-CM

## 2024-01-10 DIAGNOSIS — C549 Malignant neoplasm of corpus uteri, unspecified: Secondary | ICD-10-CM

## 2024-01-10 LAB — CMP (CANCER CENTER ONLY)
ALT: 14 U/L (ref 0–44)
AST: 17 U/L (ref 15–41)
Albumin: 3.9 g/dL (ref 3.5–5.0)
Alkaline Phosphatase: 101 U/L (ref 38–126)
Anion gap: 4 — ABNORMAL LOW (ref 5–15)
BUN: 20 mg/dL (ref 8–23)
CO2: 32 mmol/L (ref 22–32)
Calcium: 9.4 mg/dL (ref 8.9–10.3)
Chloride: 98 mmol/L (ref 98–111)
Creatinine: 0.82 mg/dL (ref 0.44–1.00)
GFR, Estimated: >60 mL/min (ref >60)
Glucose, Bld: 289 mg/dL — ABNORMAL HIGH (ref 70–99)
Potassium: 4.2 mmol/L (ref 3.5–5.1)
Sodium: 134 mmol/L — ABNORMAL LOW (ref 135–145)
Total Bilirubin: 0.5 mg/dL (ref 0.0–1.2)
Total Protein: 6.6 g/dL (ref 6.5–8.1)

## 2024-01-10 LAB — CBC WITH DIFFERENTIAL (CANCER CENTER ONLY)
Abs Immature Granulocytes: 0.01 K/uL (ref 0.00–0.07)
Basophils Absolute: 0 K/uL (ref 0.0–0.1)
Basophils Relative: 1 %
Eosinophils Absolute: 0.7 K/uL — ABNORMAL HIGH (ref 0.0–0.5)
Eosinophils Relative: 10 %
HCT: 32.9 % — ABNORMAL LOW (ref 36.0–46.0)
Hemoglobin: 11.2 g/dL — ABNORMAL LOW (ref 12.0–15.0)
Immature Granulocytes: 0 %
Lymphocytes Relative: 24 %
Lymphs Abs: 1.7 K/uL (ref 0.7–4.0)
MCH: 30.4 pg (ref 26.0–34.0)
MCHC: 34 g/dL (ref 30.0–36.0)
MCV: 89.4 fL (ref 80.0–100.0)
Monocytes Absolute: 0.4 K/uL (ref 0.1–1.0)
Monocytes Relative: 6 %
Neutro Abs: 4.3 K/uL (ref 1.7–7.7)
Neutrophils Relative %: 59 %
Platelet Count: 233 K/uL (ref 150–400)
RBC: 3.68 MIL/uL — ABNORMAL LOW (ref 3.87–5.11)
RDW: 12.8 % (ref 11.5–15.5)
WBC Count: 7.2 K/uL (ref 4.0–10.5)
nRBC: 0 % (ref 0.0–0.2)

## 2024-01-10 MED ORDER — HEPARIN SOD (PORK) LOCK FLUSH 100 UNIT/ML IV SOLN
500.0000 [IU] | Freq: Once | INTRAVENOUS | Status: AC
  Administered 2024-01-10: 500 [IU]

## 2024-01-10 MED ORDER — SODIUM CHLORIDE 0.9% FLUSH
10.0000 mL | Freq: Once | INTRAVENOUS | Status: AC
  Administered 2024-01-10: 10 mL

## 2024-01-10 NOTE — Progress Notes (Signed)
 Gynecologic Oncology Return Clinic Visit  Date of Service: 01/10/2024 Referring Provider: Marie-Lyne Lavoie, MD  [facility]  Assessment & Plan: [name]  [name] is a 73 y.o. 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 Currently maintenance immunotherapy on hold in setting of IRAE - diabetes. Presents today for surveillance.  Gyn malignancy: - Completed adjuvant chemo. 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 - CT from 10/31/23 reviewed, NED from malignancy perspective.  Stable right groin lymph node. - NED on exam today - Signs/symptoms of recurrence reviewed.   - Per Dr. Lonn, plan to reassess if improvement in diabetes control in July. If not improved, plan repeat imaging in August.  - Recommend q82mo follow-up.   Obturator nerve injury and repair: - Resolved.  No ongoing functional limitations.  Lymphedema/lower extremity edema: -Stable -Has opted to forego PT for this.  Hotflashes/nightsweats/poor sleep - Using gabapentin  20 mg nightly which seems to help son.  Continue.  RTC 3 mo   Lisa Masters, MD Gynecologic Oncology   Medical Decision Making I personally spent  TOTAL 30 minutes face-to-face and non-face-to-face in the care of this patient, which includes all pre, intra, and post visit time on the date of service.    ----------------------- Reason for Visit: Surveillance  Treatment History: Oncology History Overview Note  Er neg, MSI stable, PD-L1 80% No genetic testing done, did not meet criteria   Uterine cancer (HCC)  10/23/2022 Imaging   1. Nonocclusive thrombus of the right  common femoral vein and saphenofemoral junction. 2. Multiple prominent palpable lymph nodes in the right groin measuring up to 2.1 cm, nonspecific. 3. Small right popliteal fossa Baker's cyst.   10/25/2022 Imaging   Ct imaging 1. Pelvic lymphadenopathy with the most prominent lymph node along the right iliac chain measuring 5.6 x 2.8 cm. Metastatic lymphadenopathy or lymphoproliferative disease cannot be excluded.  2. 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 3. Prominent right inguinal lymph nodes. 4. 8 mm sclerotic bone lesion within the right ischium, indeterminate. 5. Aortic atherosclerosis.   Aortic Atherosclerosis (ICD10-I70.0).       11/11/2022 Pathology Results   SURGICAL PATHOLOGY CASE: [id number] PATIENT: Lisa  Zavala Surgical Pathology Report  Specimen Submitted: A. Lymph node, right pelvic  Clinical History: Pelvic lymphadenopathy  DIAGNOSIS: A. LYMPH NODE, RIGHT PELVIC; CT-GUIDED CORE NEEDLE BIOPSY: - METASTATIC POORLY DIFFERENTIATED CARCINOMA, SEE COMMENT.  Comment: Immunohistochemical studies show tumor cells to be positive for CK7, MOC-31, GATA-3, CK5/6, and calretinin (subset). P16 is strongly and diffusely positive. P53 demonstrates an absence of staining within tumor cells, indicative of a null staining pattern. PAX-8, WT-1, D240, TTF-1, cdx-2, and p40 are negative. The pattern of immunohistochemical staining is non-specific. Based on the morphologic features and pattern of immunohistochemical staining the differential diagnosis includes metastatic urothelial carcinoma, metastatic breast carcinoma, and metastatic serous carcinoma of gyn origin. Correlation with radiographic findings is required.  There is sufficient tissue present for ancillary molecular testing.  IHC slides were prepared by  [facility], [place], [place]. All controls stained  appropriately.   11/16/2022 PET scan   1. Examination is positive for tracer avid adenopathy within the retroperitoneum, bilateral pelvis and right inguinal region. Findings are compatible with nodal metastasis. Primary neoplasm is not apparent on the current exam.  2. No signs of solid organ metastasis within the abdomen or pelvis. 3. No signs of tracer avid disease above the diaphragm. 4. Asymmetric subcutaneous edema is identified involving the visualized portions of the right lower extremity. 5. 5 mm perifissural nodule in the left mid lung is too small to characterize by PET-CT. 6. Increased uptake within both lobes of thyroid  gland, right greater than left. Correlation with thyroid  function tests advised. 7.  Aortic Atherosclerosis (ICD10-I70.0).   11/19/2022 Initial Diagnosis   Gynecologic malignancy (HCC)   11/20/2022 Cancer Staging   Staging form: Corpus Uteri - Carcinoma and Carcinosarcoma, AJCC 8th Edition - Clinical stage from 11/20/2022: FIGO Stage IVB (cTX, cN2a, pM1) - Signed by Lonn Hicks, MD on 11/20/2022 Stage prefix: Initial diagnosis   12/02/2022 Procedure   Ultrasound and fluoroscopically guided right internal jugular single lumen power port catheter insertion. Tip in the SVC/RA junction. Catheter ready for use.   12/04/2022 - 12/04/2022 Chemotherapy   Patient is on Treatment Plan : UTERINE Carboplatin  AUC 6 + Paclitaxel  q21d     12/04/2022 -  Chemotherapy   Patient is on Treatment Plan : UTERINE Pembrolizumab  (200), Paclitaxel  (175), Carboplatin  (5) q21d x 6 cycles / Pembrolizumab  (400) q42d     12/15/2022 Imaging   1. No adnexal mass. No uterine mass lesion evident and there is no substantial thickening of the endometrium by MRI. Low signal intensity fibrous stroma of the cervix appears preserved although high signal intensity of the cervical mucosa is somewhat prominent. This could be  correlated with Pap smear as clinically warranted. 2. Stable right pelvic sidewall and groin lymphadenopathy. 3. Small Bartholin's cysts bilaterally.   02/05/2023 Imaging   CT CHEST ABDOMEN PELVIS W CONTRAST  Result Date: 02/05/2023 CLINICAL DATA:  History of endometrial cancer, monitor. High-risk. * Tracking Code: BO * EXAM: CT CHEST, ABDOMEN, AND PELVIS WITH CONTRAST TECHNIQUE: Multidetector CT imaging of the chest, abdomen and pelvis was performed following the standard protocol during bolus administration of intravenous contrast. RADIATION DOSE REDUCTION: This exam was performed according to the departmental dose-optimization program which includes automated exposure control, adjustment of the mA and/or kV according to patient size and/or use of iterative reconstruction technique. CONTRAST:  OMNIPAQUE  IOHEXOL  300 MG/ML  SOLN COMPARISON:  Multiple priors including MRI pelvis December 15, 2022 PET-CT Nov 16, 2022 and CT abdomen pelvis October 25, 2022. FINDINGS: CT CHEST FINDINGS Cardiovascular: Right chest wall Port-A-Cath with the tip in the right atrium. Aortic atherosclerosis. Normal caliber thoracic aorta. No central pulmonary embolus on this nondedicated study. Normal size heart. No significant pericardial effusion/thickening Mediastinum/Nodes: No suspicious thyroid  nodule. No pathologically enlarged mediastinal, hilar or axillary lymph nodes the esophagus is grossly unremarkable. Lungs/Pleura: Stable scattered tiny pulmonary nodules. For instance: -2 mm right upper lobe pulmonary nodule on image 29/3, unchanged -2 mm pulmonary nodule along the right major fissure on image 73/3, unchanged -4 mm pulmonary nodule along the left major fissure on image 73/3, unchanged. No pleural effusion.  No pneumothorax. Musculoskeletal: No suspicious chest wall lesion. No aggressive lytic or blastic lesion of bone. CT ABDOMEN PELVIS FINDINGS Hepatobiliary: Diffuse hepatic steatosis. Gallbladder is unremarkable. No  biliary ductal dilation Pancreas: No pancreatic ductal dilation or evidence of acute inflammation Spleen: No splenomegaly. Adrenals/Urinary Tract: Bilateral  adrenal glands appear normal. No hydronephrosis. Kidneys demonstrate symmetric enhancement. No suspicious renal mass. Urinary bladder is not well evaluated due to underdistention and streak artifact from left hip arthroplasty. Stomach/Bowel: No radiopaque enteric contrast material was administered. Hyperdensity along the posterior aspect of the stomach on image 53/2 appears similar on delayed imaging and is compatible with ingested material. No pathologic dilation of large or small bowel. No evidence of acute bowel inflammation. Vascular/Lymphatic: Normal caliber abdominal aorta. Aortic atherosclerosis. The portal, splenic and superior mesenteric veins are patent. Decreased size of the retroperitoneal, bilateral pelvic sidewall/iliac side chain and right inguinal lymph nodes. For reference: -Retrocaval lymph node measures 6 mm in short axis on image 69/2 previously 11 mm -Right sidewall lymph node measures 1.5 cm in short axis on image 110/2 previously 2.9 cm Reproductive: Evaluation of the pelvic reproductive structures is limited by streak artifact from left hip arthroplasty. Other: No significant abdominopelvic free fluid. No discrete peritoneal or omental nodularity. Musculoskeletal: No aggressive lytic or blastic lesion of bone. Multilevel degenerative changes spine. Left total hip arthroplasty. Degenerative change of the right hip. IMPRESSION: 1. Decreased size of the retroperitoneal, bilateral pelvic sidewall/iliac side chain and right inguinal lymph nodes, compatible with treatment response. 2. Stable scattered tiny pulmonary nodules, nonspecific but favored benign. Suggest continued attention on follow-up imaging 3. No evidence of new or progressive metastatic disease within the chest, abdomen or pelvis. 4. Diffuse hepatic steatosis. 5.  Aortic  Atherosclerosis (ICD10-I70.0). Electronically Signed   By: Reyes Holder M.Lisa.   On: 02/05/2023 15:03   XR Wrist Complete Right  Result Date: 01/13/2023 Right wrist  3 views: Right wrist distal radius fracture remains unchanged in overall position alignment.  Fracture is healed.  No acute findings otherwise.     02/16/2023 Pathology Results    SURGICAL PATHOLOGY CASE: [id number] PATIENT: Areen  Curlin Surgical Pathology Report  Clinical History: poorly differentiated carcinoma of likely GYN origin  FINAL MICROSCOPIC DIAGNOSIS:  A. UTERUS, CERVIX, BILATERAL FALLOPIAN TUBES, AND OVARIES, RESECTION: Cervix     Nabothian cysts     Negative for dysplasia or malignancy Endometrium     Atrophic     Negative for atypia, hyperplasia or malignancy Myometrium     Unremarkable with focal vascular calcifications     Negative for malignancy Right ovary     Focal paraovarian, inactive endometriosis     Negative for malignancy Left ovary     Unremarkable     Negative for endometriosis or malignancy Right fallopian tube     Not identified in entirely submitted right adnexa Left fallopian tube     Not identified in entirely submitted left adnexa  B. RIGHT PELVIC LYMPH NODE, EXCISION: Lymph node with hyalin fibrosis, focal necrosis and dystrophic calcifications Focal perinodal fat necrosis Negative for residual, viable carcinoma See comment  C. SEGMENT OF OBTURATOR NERVE, EXCISION: Benign nerve Negative for malignancy  Lisa. OMENTUM: Benign adipose tissue Negative for malignancy  COMMENT: B. The right pelvic lymph node is entirely submitted and shows large areas of hyalin fibrosis focally associated with necrosis and dystrophic calcifications.  The findings are consistent with tumor regression although no residual, viable carcinoma is identified.     02/16/2023 Surgery   GYNECOLOGIC ONCOLOGY OPERATIVE NOTE   Date of Service: 02/16/2023   Preoperative Diagnosis: Poorly  differentiated carcinoma of likely GYN origin    Postoperative Diagnosis: Same, obturator nerve injury and repair   Procedures: Robotic-assisted total laparoscopic hysterectomy, bilateral salpingo-oophorectomy, tumor debulking of right pelvic lymph node  and minilaparotomy for omentectomy, repair of right obturator nerve  Findings: On bimanual exam, small mobile uterus, no adnexal mass. No palpable right lymphadenopathy. On entry to abdomen, normal upper abdominal survey with smooth diaphragm, liver, stomach and normal appearing omentum and bowel. Nodule in the gallbladder fossa (image in media). In the pelvis, filmy adhesions of the sigmoid colon to the left pelvic sidewall. Otherwise small, normal appearing uterus and bilateral fallopian tubes and ovaries. Right pelvic sidewall with enlarged lymph node, approximately 4x2cm, densely adherent to the pelvic sidewall, external iliac vein. Densely adherent and encasing the obturator nerve and artery. With careful dissection, unavoidable avulsion injury occurred to the obturator artery. Due to the densely adherent nature of the lymph node, the nerve and artery were initially inseparable. In obtaining control of the bleeding from the obturator artery, unavoidable crush and cautery injury to the obturator nerve. Following removal of the involved node, the affected portion of the obturator nerve was resected and well reapproximated. No other gross evidence of disease in the abdomen or pelvis.    05/21/2023 Imaging   CT CHEST ABDOMEN PELVIS W CONTRAST  Result Date: 05/21/2023 CLINICAL DATA:  History of endometrial cancer, monitor. * Tracking Code: BO *. EXAM: CT CHEST, ABDOMEN, AND PELVIS WITH CONTRAST TECHNIQUE: Multidetector CT imaging of the chest, abdomen and pelvis was performed following the standard protocol during bolus administration of intravenous contrast. RADIATION DOSE REDUCTION: This exam was performed according to the departmental dose-optimization  program which includes automated exposure control, adjustment of the mA and/or kV according to patient size and/or use of iterative reconstruction technique. CONTRAST:  100mL OMNIPAQUE  IOHEXOL  300 MG/ML  SOLN COMPARISON:  Multiple priors including most recent CT February 05, 2023. FINDINGS: CT CHEST FINDINGS Cardiovascular: Accessed right chest Port-A-Cath with tip in the right atrium. Aortic atherosclerosis. No central pulmonary embolus on this nondedicated study. Normal size heart. No significant pericardial effusion/thickening. Mediastinum/Nodes: No suspicious thyroid  nodule. No pathologically enlarged mediastinal, hilar or axillary lymph nodes. The esophagus is grossly unremarkable. Lungs/Pleura: Stable scattered tiny pulmonary nodules. No new suspicious pulmonary nodules or masses. For reference: -right upper lobe pulmonary nodule measuring 2 mm on image 27/302 -pulmonary nodule along the right major fissure measuring 2 mm on image 66/302 -pulmonary nodule along the left major fissure measuring 4 mm on image 63/302. Musculoskeletal: No aggressive lytic or blastic lesion of bone. CT ABDOMEN PELVIS FINDINGS Hepatobiliary: No suspicious hepatic lesion. Gallbladder is unremarkable. No biliary ductal dilation. Pancreas: No pancreatic ductal dilation or evidence of acute inflammation. Spleen: No splenomegaly. Adrenals/Urinary Tract: Bilateral adrenal glands appear normal. No hydronephrosis. Kidneys demonstrate symmetric enhancement. Urinary bladder not well evaluated due to streak artifact from left hip arthroplasty. Stomach/Bowel: No radiopaque enteric contrast material administered. Stomach is unremarkable for degree of distension. No pathologic dilation of small or large bowel. No evidence of acute bowel inflammation. Vascular/Lymphatic: Normal caliber abdominal aorta. Aortic atherosclerosis. The portal, splenic and superior mesenteric veins are patent. Decreased size of the retroperitoneal, pelvic and inguinal lymph  nodes although evaluation is limited by streak artifact from left hip arthroplasty. For reference: -retrocaval lymph node measures 6 mm on image 70/301 previously 11 mm -right pelvic sidewall lymph node measures 13 mm in short axis on image 103/301 previously 15 mm. -right inguinal lymph node measures 7 mm in short axis on image 121/301 previously 9 mm Reproductive: Evaluation of the pelvic structures is limited by streak artifact from left hip arthroplasty. Other: No significant abdominopelvic free fluid. No discrete peritoneal or omental  nodularity. Musculoskeletal: No aggressive lytic or blastic lesion of bone. Left total hip arthroplasty. Similar sclerotic foci in the left iliac bone and right acetabulum favored to reflect bone islands. IMPRESSION: 1. Decreased size of the retroperitoneal pelvic and inguinal lymph nodes. 2. Stable scattered tiny pulmonary nodules, favored benign. 3. No evidence of new or progressive disease in the chest, abdomen or pelvis. 4.  Aortic Atherosclerosis (ICD10-I70.0). Electronically Signed   By: Reyes Holder M.Lisa.   On: 05/21/2023 16:26      10/31/2023 Imaging   Mild pericolonic inflammatory stranding surrounding the hepatic flexure of the colon possibly reflecting a mild infectious or inflammatory colitis. No evidence of obstruction or perforation.     Interval History: Patient reports that she is having ongoing issues trying to get her diabetes under control.  She reports her biggest trouble is in the morning.  Has endocrinology follow-up 01/12/2024.  Notes that her sister has type 1 diabetes.  Patient feels like she cannot get a handle on she wants to.  But not having too many issues with lows.  Otherwise notes that her obturator nerve injury has no residual deficits.  Continues to be able to be active and play tennis etc.  Does note feeling that there is an ongoing knot in her right groin.  Unchanged edema in her leg overall. No new vaginal bleeding, abdominal/pelvic  pain, unintentional weight loss, change in bowel or bladder habits, early satiety, bloating, nausea/vomiting.   He is using gabapentin  200 mg at night (felt that 300 mg was too much), and this seems to help some with her sleep.  She also has new right shoulder arthritis and the gabapentin  helps some with this as well.    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   Top of Left Hand(Tx p Bx)    Past Surgical History:  Procedure Laterality Date   CARPAL TUNNEL RELEASE     BILAT     CATARACT EXTRACTION     BILAT    ECTOPIC PREGNANCY SURGERY     in the setting of prior BTL, lapartomy   EYE SURGERY     IR IMAGING GUIDED PORT INSERTION  12/02/2022   KNEE CARTILAGE SURGERY     RIGHT    (MENISCUS)   LAPAROTOMY N/A 02/16/2023   Procedure: MINI LAPAROTOMY;  Surgeon: Eldonna Mays, MD;  Location: [facility];  Service: Gynecology;  Laterality: N/A;   LYMPH NODE DISSECTION Right 02/16/2023   Procedure: RIGHT PELVIC LYMPH NODE DISSECTION;  Surgeon: Eldonna Mays, MD;  Location: [facility];  Service: Gynecology;  Laterality: Right;   MOHS SURGERY     left nose/inner eye for basal cell   TONSILLECTOMY     TOTAL HIP ARTHROPLASTY Left 12/19/2015   TOTAL HIP ARTHROPLASTY Left 12/19/2015   Procedure: LEFT TOTAL HIP ARTHROPLASTY;  Surgeon: Maude LELON Right,  MD;  Location: [facility];  Service: Orthopedics;  Laterality: Left;   TRIGGER FINGER RELEASE     TUBAL LIGATION       Family History  Problem Relation Age of Onset   Uterine cancer Mother 45   Heart attack Mother    Hypertension Mother    Kidney cancer Father 31   Lung cancer Father    Diabetes Sister    Thyroid  disease Neg Hx    Colon cancer Neg Hx    Breast cancer Neg Hx    Ovarian cancer Neg Hx    Endometrial cancer Neg Hx    Pancreatic cancer Neg Hx    Prostate cancer Neg Hx     Social History   Socioeconomic History   Marital status: Married    Spouse name: Not on file   Number of children: Not on file   Years of education: Not on file   Highest education level: Not on file  Occupational History   Not on file  Tobacco Use   Smoking status: Never   Smokeless tobacco: Never  Vaping Use   Vaping status: Never Used  Substance and Sexual Activity   Alcohol  use: Yes    Alcohol /week: 0.0 standard drinks of alcohol     Comment: Rare   Drug use: No   Sexual activity: Yes    Birth control/protection: Surgical, Post-menopausal    Comment: Tubal lig-1st intercourse 73 yo-Fewer than 5 partners  Other Topics Concern   Not on file  Social History Narrative   Not on file   Social Drivers of Health   Financial Resource Strain: Not on file  Food Insecurity: Low Risk  (12/20/2023)   Received from [facility]   Hunger Vital Sign    Within the past 12 months, you worried that your food would run out before you got money to buy more: Never true    Within the past 12 months, the food you bought just didn't last and you didn't have money to get more. : Never true  Transportation Needs: No Transportation Needs (12/20/2023)   Received from [facility]    In the past 12 months, has lack of reliable transportation kept you from medical appointments, meetings, work or from getting things needed for daily living? : No  Physical Activity: Not on file  Stress: Not on file  Social Connections: Socially Integrated (10/31/2023)   Social Connection and Isolation Panel    Frequency  of Communication with Friends and Family: More than three times a week    Frequency of Social Gatherings with Friends and Family: Three times a week    Attends Religious Services: More than 4 times per year    Active Member of Clubs or Organizations: Yes    Attends Banker Meetings: More than 4 times per year    Marital Status: Married    Current Medications:  Current Outpatient Medications:    acetaminophen  (TYLENOL ) 500 MG tablet, Take 500-1,000 mg by mouth every 6 (six) hours as needed (for muscle pain or soreness)., Disp: , Rfl:    aspirin  EC 81 MG tablet, Take 81 mg by mouth at bedtime. Swallow whole., Disp: , Rfl:    Blood Glucose Monitoring Suppl (BLOOD GLUCOSE MONITOR SYSTEM) w/Device KIT, Use to check blood sugar three times daily, Disp: 1 kit, Rfl: 0   calcium carbonate (TUMS) 500 MG chewable tablet, Chew 1 tablet by mouth daily., Disp: , Rfl:    Carboxymethylcellulose Sodium (THERATEARS OP),  Place 1 drop into both eyes 3 (three) times daily., Disp: , Rfl:    Cholecalciferol (VITAMIN Lisa ) 50 MCG (2000 UT) tablet, Take 2,000 Units by mouth daily., Disp: , Rfl:    Continuous Glucose Sensor (FREESTYLE LIBRE 3 PLUS SENSOR) MISC, Inject 1 Device into the skin continuous. Change every 15 days, Disp: 6 each, Rfl: 3   diclofenac  sodium (VOLTAREN ) 1 % GEL, Apply 2-4 g topically 4 (four) times daily., Disp: 3 Tube, Rfl: 3   gabapentin  (NEURONTIN ) 100 MG capsule, Take 100-200 mg by mouth daily as needed (for hot flashes)., Disp: , Rfl:    gabapentin  (NEURONTIN ) 300 MG capsule, Take 300 mg by mouth daily as needed (for hot flashes not relieved by the 100-200 mg dose)., Disp: , Rfl:    Glucose Blood (BLOOD GLUCOSE TEST STRIPS) STRP, Use to check blood sugar three times daily, Disp: 100 strip, Rfl: 0   insulin  aspart (NOVOLOG ) 100 UNIT/ML FlexPen, Inject 5 Units into the skin 3 (three) times daily with meals. If eating and Blood Glucose (BG) 80 or higher inject 5 units for meal coverage  and add correction dose per scale. If not eating, correction dose only. BG <150= 0 unit; BG 150-200= 1 unit; BG 201-250= 2 unit; BG 251-300= 3 unit; BG 301-350= 4 unit; BG 351-400= 5 unit; BG >400= 6 unit and Call Primary Care., Disp: 15 mL, Rfl: 0   insulin  glargine (LANTUS ) 100 UNIT/ML Solostar Pen, Inject 12 Units into the skin daily., Disp: 15 mL, Rfl: 1   Insulin  Pen Needle (PEN NEEDLES) 31G X 5 MM MISC, Use to inject insulin  as directed, Disp: 100 each, Rfl: 0   Lancet Device MISC, 1 each by Does not apply route 3 (three) times daily. May dispense any manufacturer covered by patient's insurance., Disp: 1 each, Rfl: 0   Lancets MISC, Use to check blood sugar three times daily, Disp: 100 each, Rfl: 0   levothyroxine  (SYNTHROID ) 100 MCG tablet, Take 1 tablet (100 mcg total) by mouth daily., Disp: 60 tablet, Rfl: 3   lidocaine -prilocaine  (EMLA ) cream, Apply 1 Application topically as needed., Disp: 30 g, Rfl: 3   melatonin 5 MG TABS, Take 5 mg by mouth at bedtime as needed (sleep)., Disp: , Rfl:    meloxicam  (MOBIC ) 7.5 MG tablet, Take 1 tablet (7.5 mg total) by mouth daily., Disp: 30 tablet, Rfl: 1   Multiple Vitamin (MULTIVITAMIN) tablet, Take 1 tablet by mouth daily with breakfast., Disp: , Rfl:   Review of Symptoms: Complete 10-system review is positive for: Joint pain, leg cramps, right leg swelling that is not new, hot flashes  Physical Exam: BP 109/61 (BP Location: Left Arm, Patient Position: Sitting)   Pulse 66   Temp 98.6 F (37 C) (Oral)   Resp 19   Wt 137 lb 6.4 oz (62.3 kg)   SpO2 99%   BMI 24.34 kg/m  General: Alert, oriented, no acute distress. HEENT: Normocephalic, atraumatic. Neck symmetric without masses. Sclera anicteric.  Chest: Normal work of breathing. Clear to auscultation bilaterally.   Cardiovascular: Regular rate and rhythm, no murmurs. Abdomen: Soft, nontender.  Normoactive bowel sounds.  No masses appreciated.  Well-healed incisions. Extremities: Grossly  normal range of motion. Warm, well perfused.  No pitting edema in bilateral lower extremities. Increased varicosities in RLE.  Skin: No rashes or lesions noted. Lymphatics: No cervical, supraclavicular adenopathy. Palpable but not enlarged right inguinal lymph node. GU: Normal appearing external genitalia without erythema, excoriation, or lesions.  Speculum exam reveals  well-healed vaginal cuff, no lesions. Bimanual exam reveals smooth vaginal cuff, no nodularity or pelvic mass.  Exam chaperoned by Kimberly Swaziland, CMA    Laboratory & Radiologic Studies: CT ABDOMEN PELVIS W CONTRAST 10/31/2023  Narrative CLINICAL DATA:  Acute nonlocalized abdominal pain  EXAM: CT ABDOMEN AND PELVIS WITH CONTRAST  TECHNIQUE: Multidetector CT imaging of the abdomen and pelvis was performed using the standard protocol following bolus administration of intravenous contrast.  RADIATION DOSE REDUCTION: This exam was performed according to the departmental dose-optimization program which includes automated exposure control, adjustment of the mA and/or kV according to patient size and/or use of iterative reconstruction technique.  CONTRAST:  75mL OMNIPAQUE  IOHEXOL  300 MG/ML  SOLN  COMPARISON:  None Available.  FINDINGS: Lower chest: No acute abnormality.  Hepatobiliary: No focal liver abnormality is seen. No gallstones, gallbladder wall thickening, or biliary dilatation.  Pancreas: Unremarkable  Spleen: Unremarkable  Adrenals/Urinary Tract: The adrenal glands are unremarkable. The kidneys are normal. The bladder is partially obscured by streak artifact, however, the visualized portion is unremarkable.  Stomach/Bowel: There is mild pericolonic inflammatory stranding surrounding the hepatic flexure of the colon best seen on image # 42/2, possibly to a mild underlying infectious or inflammatory colitis. There is no evidence of obstruction or perforation. The stomach, small bowel, and large bowel  are otherwise unremarkable. Appendix normal. No free intraperitoneal gas or fluid.  Vascular/Lymphatic: Aortic atherosclerosis. No enlarged abdominal or pelvic lymph nodes. Index right inguinal lymph node is stable measuring 9 mm in short axis diameter at axial image # 75/2.  Reproductive: Status post hysterectomy. No adnexal masses.  Other: No abdominal wall hernia or abnormality. No abdominopelvic ascites.  Musculoskeletal: Left total hip arthroplasty has been performed. Osseous structures are age-appropriate. No acute bone abnormality.  IMPRESSION: 1. Mild pericolonic inflammatory stranding surrounding the hepatic flexure of the colon possibly reflecting a mild infectious or inflammatory colitis. No evidence of obstruction or perforation.  Aortic Atherosclerosis (ICD10-I70.0).   Electronically Signed By: Dorethia Molt M.Lisa. On: 10/31/2023 13:19

## 2024-01-10 NOTE — Progress Notes (Signed)
 Met with [name]  and let her know that Dr. Lonn has ordered a CT scan and would like her to schedule it for 01/31/2024 with a port flush/lab appointment before the scan. Also advised that Dr. Lonn will review the CT scan results on 02/08/2024 at 12:00. [name]  verbalized understanding and said she usually schedules the CT's at [facility].  She will arrange for a port flush/lab appointment before her CT scan there.

## 2024-01-10 NOTE — Patient Instructions (Signed)
 It was a pleasure to see you in clinic today. - Stable exam - Return visit planned for 3 months  Thank you very much for allowing me to provide care for you today.  I appreciate your confidence in choosing our Gynecologic Oncology team at [facility].  If you have any questions about your visit today please call our office or send us  a MyChart message and we will get back to you as soon as possible.

## 2024-01-11 ENCOUNTER — Ambulatory Visit: Payer: Self-pay | Admitting: Hematology and Oncology

## 2024-01-11 ENCOUNTER — Other Ambulatory Visit: Payer: Self-pay | Admitting: *Deleted

## 2024-01-11 ENCOUNTER — Other Ambulatory Visit: Payer: Self-pay

## 2024-01-11 LAB — T4: T4, Total: 6.2 ug/dL (ref 4.5–12.0)

## 2024-01-11 LAB — TSH: TSH: 8.7 u[IU]/mL — ABNORMAL HIGH (ref 0.350–4.500)

## 2024-01-11 MED ORDER — LEVOTHYROXINE SODIUM 125 MCG PO TABS
125.0000 ug | ORAL_TABLET | Freq: Every day | ORAL | 0 refills | Status: DC
Start: 2024-01-11 — End: 2024-02-29

## 2024-01-11 NOTE — Progress Notes (Signed)
 Per Dr. Lonn, called pt to make aware that TSH had increased and a new rx was sent to her pharmacy. Vmail was left and advised to call back with any concerns

## 2024-01-12 ENCOUNTER — Ambulatory Visit: Admitting: Internal Medicine

## 2024-01-12 ENCOUNTER — Encounter: Payer: Self-pay | Admitting: Internal Medicine

## 2024-01-12 VITALS — BP 112/60 | HR 78 | Ht 63.0 in | Wt 136.4 lb

## 2024-01-12 DIAGNOSIS — E1059 Type 1 diabetes mellitus with other circulatory complications: Secondary | ICD-10-CM

## 2024-01-12 DIAGNOSIS — E063 Autoimmune thyroiditis: Secondary | ICD-10-CM | POA: Diagnosis not present

## 2024-01-12 DIAGNOSIS — E042 Nontoxic multinodular goiter: Secondary | ICD-10-CM | POA: Diagnosis not present

## 2024-01-12 LAB — POCT GLYCOSYLATED HEMOGLOBIN (HGB A1C): Hemoglobin A1C: 9.3 % — AB (ref 4.0–5.6)

## 2024-01-12 MED ORDER — PEN NEEDLES 31G X 5 MM MISC: 1.0000 | Freq: Three times a day (TID) | 5 refills | Status: AC

## 2024-01-12 MED ORDER — INSULIN GLARGINE 100 UNIT/ML SOLOSTAR PEN: 20.0000 [IU] | PEN_INJECTOR | Freq: Every day | SUBCUTANEOUS | 3 refills | Status: AC

## 2024-01-12 MED ORDER — INSULIN ASPART 100 UNIT/ML FLEXPEN: 4.0000 [IU] | PEN_INJECTOR | Freq: Three times a day (TID) | SUBCUTANEOUS | 3 refills | Status: AC

## 2024-01-12 NOTE — Progress Notes (Addendum)
 Patient ID: Lisa  NACOLE Zavala, female   DOB: Nov 16, 1950, 73 y.o.   MRN: [id number]  HPI  Lisa  D Zavala is a 73 y.o.-year-old female, initially referred by Dr Rockney for management of hypothyroidism, diagnosed as secondary to Hashimoto's thyroiditis, and also thyroid  nodules, diagnosed 11/2021, but also type 1 diabetes, dx'ed 10/2023.  Last visit 2.5 months ago. She is here with her husband.  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 She started ChTx last spring.  She then started Keytruda , but this was stopped after she developed DKA 10/31/2023.  At that time, she had nausea/vomiting/diarrhea and she was diagnosed with colitis. She was started on basal-bolus insulin  regimen. She is feeling well, and continues to play tennis, and walk for exercise.  Also, she is doing a great job adjusting her diet -eats a low carb diet, reduces highly processed foods.  DM1: - Diagnosed 4 days ago after an admission for DKA in the setting of colitis.  She was on Keytruda  at that time but this was stopped.  Reviewed hbA1c Levels: Lab Results  Component Value Date   HGBA1C 7.8 (H) 10/31/2023   HGBA1C 7.5 (H) 10/31/2023   HGBA1C 5.5 10/21/2011   She is on: - Lantus  12 units daily in am - Novolog  5 units before each meal + sliding scale >> 15 minutes before each meal: 3 units before a smaller meal 4 units before a regular meal 5 units before a large meal - NovoLog  sliding scale: 150-200: + 1 unit 201-250: + 2 units 251-300: + 3 units >301-350: + 4 units  Meter: AccuChek  Pt checks her sugars more than 4 times a day with her CGM:  Previously:  Lowest sugar was 84 >> 55; she has hypoglycemia awareness at 70.  She does not a glucagon kit at home.  Highest sugar was 320 >> HI. No previous DKA admissions.    Pt's meals are: - Breakfast: Cookie or cracker - Lunch: Leftovers or soup or salad  or veggies - Dinner: Meat and veggies - Snacks: sweets >> stopped  - no CKD, last BUN/creatinine:  Lab Results  Component Value Date   BUN 20 01/10/2024   BUN 16 11/19/2023   CREATININE 0.82 01/10/2024   CREATININE 0.82 11/19/2023   No results found for: MICRALBCREAT  - No HL: Last set of lipids:  Lab Results  Component Value Date   CHOL 161 12/14/2016   HDL 71 12/14/2016   LDLCALC 77 12/14/2016   TRIG 65 12/14/2016   CHOLHDL 2.3 12/14/2016  She is not on a statin.  - last eye exam was in 08/2023. No DR reportedly.   - no numbness and tingling in her feet.  She does not have a podiatrist.  She has family history of type 1 diabetes in her sister.  Hypothyroidism: Reviewed history: Patient was diagnosed with hypothyroidism in 10/2014 and started on levothyroxine  50 mcg daily.   We increased the dose to 75 mcg daily in 11/2021. At our visit from 10/2023, she was taking 100 mcg daily. In 12/2023, Dr. Lonn increased her LT4 to 125 mcg daily.  She takes her levothyroxine : - in am (~4 am) - fasting - at least 1h from + b'fast and coffee + creamer - Ca, vit D >4h after LT4 - no Fe, + MVI at dinnertime - no PPIs - stopped Biotin   Reviewed her TFTs: Lab Results  Component  Value Date   TSH 8.700 (H) 01/10/2024   TSH 2.140 11/19/2023   TSH 0.780 10/31/2023   TSH 0.803 10/19/2023   TSH 0.812 08/27/2023   TSH 0.951 07/13/2023   TSH 5.086 (H) 06/01/2023   TSH 7.198 (H) 05/06/2023   TSH 0.044 (L) 01/15/2023   TSH 1.119 11/30/2022   FREET4 0.81 01/19/2022   FREET4 0.78 12/01/2021   FREET4 0.80 12/02/2020   FREET4 1.02 01/08/2020   FREET4 0.88 11/28/2018   FREET4 0.90 12/13/2017   FREET4 0.87 12/04/2016   FREET4 0.95 08/28/2016   FREET4 1.01 10/18/2015   FREET4 1.05 04/15/2015    Patient's TPO antibodies were elevated, confirming Hashimoto's thyroiditis Component     Latest Ref Rng 04/15/2015  Thyroperoxidase Ab SerPl-aCnc     <9 IU/mL 151 (H)   Thyroglobulin Ab     <2 IU/mL <1   Thyroid  nodule: - Initially detected on palpation - Reviewed previous investigation:  Thyroid  U/S (12/17/2021): Parenchymal Echotexture: Moderately heterogenous  Isthmus: 3 mm  Right lobe: 4.5 x 2.1 x 1.4 cm  Left lobe: 3.9 x 1.2 x 1.2 cm _________________________________________________________   Nodule # 1:  Location: Isthmus; right  Maximum size: 1.3 cm; Other 2 dimensions: 1.1 x 0.6 cm  Composition: solid/almost completely solid (2)  Echogenicity: hypoechoic (2)  ACR TI-RADS total points: 4.  *Given size (>/= 1 - 1.4 cm) and appearance, a follow-up ultrasound in 1 year should be considered based on TI-RADS criteria.  _________________________________________________________   Additional subcentimeter hypoechoic nodule in the left thyroid  superiorly measures only 6 mm. This would not meet criteria for any biopsy or follow-up and is not fully detailed by TI rads criteria.   Moderate thyroid  heterogeneity suggesting component of medical thyroid  disease. Correlate with thyroid  function tests. No hypervascularity. No regional adenopathy.   IMPRESSION: 1.3 cm right isthmus TR 4 nodule meets criteria for follow-up in 1 year.   Other findings as above.  Pt denies: - feeling nodules in neck - hoarseness - dysphagia - choking  No FH of thyroid  cancer. No h/o radiation tx to head or neck. No herbal supplements. No Biotin use. No recent steroids use.   She has knee pain.  In 04/2021, she fell and hurt her elbow while playing tennis.  She had a fracture and joint effusion.  This resolved. She was seeing Dr Dolphus (rheum).  She was investigated for possible autoimmune disease in the past, but she does not have a clear diagnosis. She was playing tennis consistently and playing in tournaments, but now on hold. She had THR in 11/2015.  ROS: + see HPI  I reviewed pt's medications, allergies, PMH, social hx, family hx, and changes were  documented in the history of present illness. Otherwise, unchanged from my initial visit note.   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   Top of  Left Hand(Tx p Bx)   Past Surgical History:  Procedure Laterality Date   CARPAL TUNNEL RELEASE     BILAT     CATARACT EXTRACTION     BILAT    ECTOPIC PREGNANCY SURGERY     in the setting of prior BTL, lapartomy   EYE SURGERY     IR IMAGING GUIDED PORT INSERTION  12/02/2022   KNEE CARTILAGE SURGERY     RIGHT    (MENISCUS)   LAPAROTOMY N/A 02/16/2023   Procedure: MINI LAPAROTOMY;  Surgeon: Eldonna Mays, MD;  Location: [facility];  Service: Gynecology;  Laterality: N/A;   LYMPH NODE DISSECTION Right 02/16/2023   Procedure: RIGHT PELVIC LYMPH NODE DISSECTION;  Surgeon: Eldonna Mays, MD;  Location: [facility];  Service: Gynecology;  Laterality: Right;   MOHS SURGERY     left nose/inner eye for basal cell   TONSILLECTOMY     TOTAL HIP ARTHROPLASTY Left 12/19/2015   TOTAL HIP ARTHROPLASTY Left 12/19/2015   Procedure: LEFT TOTAL HIP ARTHROPLASTY;  Surgeon: Maude LELON Right, MD;  Location: [facility];  Service: Orthopedics;  Laterality: Left;   TRIGGER FINGER RELEASE     TUBAL LIGATION     Social History   Social History   Marital Status: Married    Spouse Name: N/A   Number of Children: 2   Occupational History    retired Programmer, systems, now Interior and spatial designer of preschool at [employer]   Social History Main Topics   Smoking status: Never Smoker    Smokeless  tobacco: Not on file   Alcohol  Use: 0.0 oz/week    0 Standard drinks or equivalent per week     Comment: Rare   Drug Use: No   Current Outpatient Medications on File Prior to Visit  Medication Sig Dispense Refill   acetaminophen  (TYLENOL ) 500 MG tablet Take 500-1,000 mg by mouth every 6 (six) hours as needed (for muscle pain or soreness).     aspirin  EC 81 MG tablet Take 81 mg by mouth at bedtime. Swallow whole.     Blood Glucose Monitoring Suppl (BLOOD GLUCOSE MONITOR SYSTEM) w/Device KIT Use to check blood sugar three times daily 1 kit 0   calcium carbonate (TUMS) 500 MG chewable tablet Chew 1 tablet by mouth daily.     Carboxymethylcellulose Sodium (THERATEARS OP) Place 1 drop into both eyes 3 (three) times daily.     Cholecalciferol (VITAMIN D ) 50 MCG (2000 UT) tablet Take 2,000 Units by mouth daily.     Continuous Glucose Sensor (FREESTYLE LIBRE 3 PLUS SENSOR) MISC Inject 1 Device into the skin continuous. Change every 15 days 6 each 3   diclofenac  sodium (VOLTAREN ) 1 % GEL Apply 2-4 g topically 4 (four) times daily. 3 Tube 3   gabapentin  (NEURONTIN ) 100 MG capsule Take 100-200 mg by mouth daily as needed (for hot flashes).     gabapentin  (NEURONTIN ) 300 MG capsule Take 300 mg by mouth daily as needed (for hot flashes not relieved by the 100-200 mg dose).     Glucose Blood (BLOOD GLUCOSE TEST STRIPS) STRP Use to check blood sugar three times daily 100 strip 0   insulin  aspart (NOVOLOG ) 100 UNIT/ML FlexPen Inject 5 Units into the skin 3 (three) times daily with meals. If eating and Blood Glucose (BG) 80 or higher inject 5 units for meal coverage and add correction dose per scale. If not eating, correction dose only. BG <150= 0 unit; BG 150-200= 1 unit; BG 201-250= 2 unit; BG 251-300= 3 unit;  BG 301-350= 4 unit; BG 351-400= 5 unit; BG >400= 6 unit and Call Primary Care. 15 mL 0   insulin  glargine (LANTUS ) 100 UNIT/ML Solostar Pen Inject 12 Units into the skin daily. 15 mL 1   Insulin  Pen  Needle (PEN NEEDLES) 31G X 5 MM MISC Use to inject insulin  as directed 100 each 0   Lancet Device MISC 1 each by Does not apply route 3 (three) times daily. May dispense any manufacturer covered by patient's insurance. 1 each 0   Lancets MISC Use to check blood sugar three times daily 100 each 0   levothyroxine  (SYNTHROID ) 100 MCG tablet Take 1 tablet (100 mcg total) by mouth daily. 60 tablet 3   levothyroxine  (SYNTHROID ) 125 MCG tablet Take 1 tablet (125 mcg total) by mouth daily before breakfast. 60 tablet 0   lidocaine -prilocaine  (EMLA ) cream Apply 1 Application topically as needed. 30 g 3   melatonin 5 MG TABS Take 5 mg by mouth at bedtime as needed (sleep).     meloxicam  (MOBIC ) 7.5 MG tablet Take 1 tablet (7.5 mg total) by mouth daily. 30 tablet 1   Multiple Vitamin (MULTIVITAMIN) tablet Take 1 tablet by mouth daily with breakfast.     No current facility-administered medications on file prior to visit.   Allergies  Allergen Reactions   Sulfa Antibiotics Swelling and Other (See Comments)    Caused tics and the tongue became swollen for approx a week   Family History  Problem Relation Age of Onset   Uterine cancer Mother 50   Heart attack Mother    Hypertension Mother    Kidney cancer Father 10   Lung cancer Father    Diabetes Sister    Thyroid  disease Neg Hx    Colon cancer Neg Hx    Breast cancer Neg Hx    Ovarian cancer Neg Hx    Endometrial cancer Neg Hx    Pancreatic cancer Neg Hx    Prostate cancer Neg Hx    PE: BP 112/60   Pulse 78   Ht 5' 3 (1.6 m)   Wt 136 lb 6.4 oz (61.9 kg)   SpO2 99%   BMI 24.16 kg/m    Wt Readings from Last 3 Encounters:  01/12/24 136 lb 6.4 oz (61.9 kg)  01/10/24 137 lb 6.4 oz (62.3 kg)  11/19/23 138 lb (62.6 kg)   Constitutional: normal weight, in NAD Eyes: EOMI, no exophthalmos ENT: + ~1.5 cm right-sided thyroid  nodule palpated, no cervical lymphadenopathy Cardiovascular: RRR, No MRG, enlarged R thigh  Respiratory: CTA  B Musculoskeletal: no deformities Skin: no rashes, + tanned Neurological: no tremor with outstretched hands Diabetic Foot Exam - Simple   Simple Foot Form Diabetic Foot exam was performed with the following findings: Yes 01/12/2024 10:19 AM  Visual Inspection No deformities, no ulcerations, no other skin breakdown bilaterally: Yes Sensation Testing Intact to touch and monofilament testing bilaterally: Yes Pulse Check Posterior Tibialis and Dorsalis pulse intact bilaterally: Yes Comments    ASSESSMENT: Insulin -dependent diabetes with complications: - New diagnosis, after she presented in DKA - Aortic atherosclerosis on CT scan (10/31/2023)  Component     Latest Ref Rng 10/31/2023  Glucose-Capillary     70 - 99 mg/dL 850 (H)   C-Peptide     1.1 - 4.4 ng/mL 0.2 (L)     Component     Latest Ref Rng 10/31/2023  Glutamic Acid Decarb Ab     0.0 - 5.0 U/mL <5.0   IA-2  Autoantibodies     U/mL <7.5   ZNT8 Antibodies     U/mL <15    2. Hypothyroidism due to Hashimoto's thyroiditis  3.  Right thyroid  nodule  PLAN:  Insulin -dependent diabetes complicated by DKA - Likely developed in the setting of anti-PD-1 inhibitor for metastatic primary gynecological cancer treatment.  Keytruda  was held at last visit.  We discussed that unfortunately, this type of diabetes is usually not reversible.  She presented with DKA few days prior to our last appointment.  The C-peptide was low, at 0.2, confirming type 1 diabetes with insulin  deficiency.  Her antipancreatic antibodies were drawn in the hospital -and they were not elevated - She continues on basal-bolus insulin  regimen with Lantus  and NovoLog . -At last visit, she just started a freestyle libre CGM but we did not have enough data to analyze trends.  However, sugars were elevated and we discussed about how to adjust the NovoLog  dose based on the size of the meal.  I also gave her a sliding scale.  She did have some lows and I advised her that if these  still happened after adjusting the NovoLog  doses, to decrease the Lantus  dose.  I strongly advised her to bolus NovoLog  15 minutes before each meal.  She had questions about how to manage her blood sugars during an upcoming tennis tournament.  We discussed about reducing the dose of mealtime insulin  if she exercises after a meal, but otherwise, to try to start exercising with the blood sugars between 150 and slightly lower than 200.  I advised her to check her sensor approximately every 30 minutes and follow the arrows.  She may need to have 15 g of carbs if the arrows are trending down or if the sugars are low.  We also discussed about using protein + fat along with sugars (maybe a protein bar) to maintain her blood sugars during exercise.  I did advise her that after certain types of exercise (cardio or strength training), sugars may trend up. We also discussed about checking blood sugars with the meter rather than glucometer when trying to correct a low blood sugar or high blood sugar. I gave her a reference book for further information about type 1 diabetes: Think Like a Pancreas by Arley Alas. -At today's visit, we were able to analyze her CGM CGM interpretation: -At today's visit, we reviewed her CGM downloads: It appears that 31% of values are in target range (goal >70%), while 69% are higher than 180 (goal <25%), and 0% are lower than 70 (goal <4%).  The calculated average blood sugar is 228.  The projected HbA1c for the next 3 months (GMI) is 8.8%. -Reviewing the CGM trends, sugars appear to increase continuously and significantly overnight, with a peak at around 9 AM, after which they start to improve but becoming more variable later in the day, with frequent high blood sugars after approximately 4 PM, and then after 9 PM.  This pattern suggests not enough basal insulin , in particular, the need to take additional basal insulin  at bedtime.  I advised her to start 8 units and increase as needed.  I  feel that this would help improve the blood sugars not only during the night but also during the first half of the day.  Regarding the blood sugars later in the day, we discussed about always bolusing Novolog  approximately 15 minutes before meals but also increase slightly the dose of NovoLog .  We can continue the same dose of morning Lantus   and NovoLog  sliding scale for - I encouraged her to let me know if the sugars do not improve after this changes -I advised her to: Patient Instructions  Please increase: - Lantus  12 units daily in am and add 8 units at bedtime (may need to increase to 10-12 units). - Novolog  - 15 min before meal: 4 units before a smaller meal 5-6 units before a regular meal 7-8 units before a large meal - NovoLog  sliding scale: 150-200: + 1 unit 201-250: + 2 units 251-300: + 3 units >301-350: + 4 units  Please continue Levothyroxine  125 mcg daily.  Take the thyroid  hormone every day, with water , at least 30 minutes before breakfast, separated by at least 4 hours from: - acid reflux medications - calcium - iron - multivitamins  Please return in 1.5 months.  - today, HbA1c is 9.3% (higher) - advised to continue to check her blood sugars with the CGM - she is up-to-date with eye exams - return to clinic in 1.5 months  2. Patient with history of autoimmune hypothyroidism, on levothyroxine  therapy - latest thyroid  labs reviewed with pt. >> TSH was elevated Lab Results  Component Value Date   TSH 8.700 (H) 01/10/2024  - she is currently on LT4 125 mcg daily, dose just increased from 100 mcg daily by Dr. Lonn - pt feels good on this dose. - we discussed about taking the thyroid  hormone every day, with water , >30 minutes before breakfast, separated by >4 hours from acid reflux medications, calcium, iron, multivitamins. Pt. is taking it correctly. - will check thyroid  tests in 1.5 months after the above change: TSH and fT4 - she mentions that she has labs pending  with her oncologist.  3.  Right thyroid  nodule - Detected on palpation - She denies neck compression symptoms  - We checked a thyroid  ultrasound at last visit and this showed a 1.3 right, solid, hypoechoic, isthmic nodule which qualified for 1 year follow-up.  She also had a left 6 mm nodule which did not require follow-up. - A PET scan showed that the whole thyroid  is an increased uptake, consistent with her diagnosis of Hashimoto's thyroiditis, but the thyroid  nodules were not necessarily FDG avid - Will repeat another ultrasound now  Orders Placed This Encounter  Procedures   US  THYROID    POCT glycosylated hemoglobin (Hb A1C)   Thyroid  U/S (01/14/2024): Parenchymal Echotexture: Markedly heterogenous  Isthmus: 0.3 cm  Right lobe: 3.3 x 0.8 x 0.9 cm  Left lobe: 2.3 x 0.6 x 0.8 cm  _________________________________________________________   Estimated total number of nodules >/= 1 cm: 0 _________________________________________________________   Small and diffusely heterogeneous thyroid  gland. No discrete thyroid  nodules are identified. The previously noted isthmic nodule is no longer visible and appears to have resolved.   IMPRESSION: 1. Interval resolution of previously identified nodule in the thyroid  isthmus. No thyroid  nodules are present on today's exam. 2. Small/atrophied and diffusely heterogeneous thyroid  gland.   Lela Fendt, MD PhD [facility]

## 2024-01-12 NOTE — Patient Instructions (Addendum)
 Please increase: - Lantus  12 units daily in am and add 8 units at bedtime (may need to increase to 10-12 units). - Novolog  - 15 min before meal: 4 units before a smaller meal 5-6 units before a regular meal 7-8 units before a large meal - NovoLog  sliding scale: 150-200: + 1 unit 201-250: + 2 units 251-300: + 3 units >301-350: + 4 units  Please continue Levothyroxine  125 mcg daily.  Take the thyroid  hormone every day, with water , at least 30 minutes before breakfast, separated by at least 4 hours from: - acid reflux medications - calcium - iron - multivitamins  Please return in 1.5 months.

## 2024-01-14 ENCOUNTER — Ambulatory Visit
Admission: RE | Admit: 2024-01-14 | Discharge: 2024-01-14 | Disposition: A | Discharge status: Home / Self Care | Source: Ambulatory Visit | Attending: Internal Medicine | Admitting: Internal Medicine

## 2024-01-14 DIAGNOSIS — E042 Nontoxic multinodular goiter: Secondary | ICD-10-CM

## 2024-01-26 ENCOUNTER — Ambulatory Visit: Payer: Self-pay | Admitting: Internal Medicine

## 2024-01-26 NOTE — Addendum Note (Signed)
 Addended by: TRIXIE FILE on: 01/26/2024 11:37 AM   Modules accepted: Orders

## 2024-01-28 ENCOUNTER — Ambulatory Visit (HOSPITAL_BASED_OUTPATIENT_CLINIC_OR_DEPARTMENT_OTHER)
Admission: RE | Admit: 2024-01-28 | Discharge: 2024-01-28 | Disposition: A | Discharge status: Home / Self Care | Source: Ambulatory Visit | Attending: Hematology and Oncology | Admitting: Hematology and Oncology

## 2024-01-28 ENCOUNTER — Inpatient Hospital Stay: Attending: Gynecologic Oncology

## 2024-01-28 DIAGNOSIS — E109 Type 1 diabetes mellitus without complications: Secondary | ICD-10-CM | POA: Diagnosis not present

## 2024-01-28 DIAGNOSIS — N951 Menopausal and female climacteric states: Secondary | ICD-10-CM | POA: Diagnosis not present

## 2024-01-28 DIAGNOSIS — Z794 Long term (current) use of insulin: Secondary | ICD-10-CM | POA: Diagnosis not present

## 2024-01-28 DIAGNOSIS — C55 Malignant neoplasm of uterus, part unspecified: Secondary | ICD-10-CM | POA: Insufficient documentation

## 2024-01-28 DIAGNOSIS — Z8542 Personal history of malignant neoplasm of other parts of uterus: Secondary | ICD-10-CM | POA: Diagnosis present

## 2024-01-28 DIAGNOSIS — R918 Other nonspecific abnormal finding of lung field: Secondary | ICD-10-CM | POA: Insufficient documentation

## 2024-01-28 DIAGNOSIS — E063 Autoimmune thyroiditis: Secondary | ICD-10-CM | POA: Diagnosis not present

## 2024-01-28 MED ORDER — IOHEXOL 300 MG/ML  SOLN
100.0000 mL | Freq: Once | INTRAMUSCULAR | Status: AC | PRN
  Administered 2024-01-28: 100 mL via INTRAVENOUS

## 2024-01-28 NOTE — Patient Instructions (Signed)

## 2024-01-31 ENCOUNTER — Other Ambulatory Visit

## 2024-02-08 ENCOUNTER — Encounter: Payer: Self-pay | Admitting: Hematology and Oncology

## 2024-02-08 ENCOUNTER — Inpatient Hospital Stay: Admitting: Hematology and Oncology

## 2024-02-08 VITALS — BP 122/76 | HR 93 | Temp 98.5°F | Resp 18 | Ht 63.0 in | Wt 138.8 lb

## 2024-02-08 DIAGNOSIS — C55 Malignant neoplasm of uterus, part unspecified: Secondary | ICD-10-CM

## 2024-02-08 DIAGNOSIS — E063 Autoimmune thyroiditis: Secondary | ICD-10-CM | POA: Diagnosis not present

## 2024-02-08 DIAGNOSIS — E109 Type 1 diabetes mellitus without complications: Secondary | ICD-10-CM | POA: Diagnosis not present

## 2024-02-08 DIAGNOSIS — N951 Menopausal and female climacteric states: Secondary | ICD-10-CM | POA: Insufficient documentation

## 2024-02-08 DIAGNOSIS — E042 Nontoxic multinodular goiter: Secondary | ICD-10-CM | POA: Diagnosis not present

## 2024-02-08 DIAGNOSIS — Z8542 Personal history of malignant neoplasm of other parts of uterus: Secondary | ICD-10-CM | POA: Diagnosis not present

## 2024-02-08 MED ORDER — ESTRADIOL 0.05 MG/24HR TD PTTW: 1.0000 | MEDICATED_PATCH | TRANSDERMAL | 12 refills | Status: DC

## 2024-02-08 NOTE — Assessment & Plan Note (Addendum)
 She has recurrent menopausal symptoms with difficulties with sleep, joint pain and others We discussed risk and benefits of hormone replacement therapy Her cancer type was estrogen receptor negative She is interested to try the patch and I will send the prescription to her pharmacy

## 2024-02-08 NOTE — Assessment & Plan Note (Addendum)
 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, responded very well with complete response with neoadjuvant chemotherapy with combination of carboplatin , paclitaxel  and pembrolizumab  followed by surgery and maintenance immunotherapy with pembrolizumab  Er neg, MSI stable, PD-L1 80%   Her recent treatment course was complicated by development of insulin -dependent diabetes and hospitalization due to DKA Since discontinuation of treatment, she is asymptomatic from uterine cancer perspective I reviewed recent CT imaging from August 2025 with the patient which showed no evidence of disease recurrence I will continue port flush maintenance every 2 months and repeat CT imaging in 6 months

## 2024-02-08 NOTE — Progress Notes (Signed)
 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, responded very well with complete response with neoadjuvant chemotherapy with combination of carboplatin , paclitaxel  and pembrolizumab  followed by surgery and maintenance immunotherapy with pembrolizumab  Er neg, MSI stable, PD-L1 80%   Her recent treatment course was complicated by development of insulin -dependent diabetes and hospitalization due to DKA Since discontinuation of treatment, she is asymptomatic from uterine cancer perspective I reviewed recent CT imaging from August 2025 with the patient which showed no evidence of disease recurrence I will continue port flush maintenance every 2 months and repeat CT imaging in 6 months Hypothyroidism due to Hashimoto's thyroiditis She will continue medication adjustment as needed Type 1 diabetes mellitus without complication (HCC) The patient has resources to modify her diet and insulin  level based on activity She will continue to work with her endocrinologist to get her blood sugar under control Menopausal symptoms She has recurrent menopausal symptoms with difficulties with sleep, joint pain and others We discussed risk and benefits of hormone replacement therapy Her cancer type was estrogen receptor negative She is interested to try the patch and I will send the prescription to her pharmacy  Orders Placed This Encounter  Procedures   CT ABDOMEN PELVIS W CONTRAST    Standing Status:   Future    Expected Date:   07/31/2024    Expiration Date:   02/07/2025    Scheduling Instructions:     No oral contrast    If indicated for the ordered procedure, I authorize the administration of contrast media  per Radiology protocol:   Yes    Does the patient have a contrast media/X-ray dye allergy?:   No    Preferred imaging location?:   [name]    If indicated for the ordered procedure, I authorize the administration of oral contrast media per Radiology protocol:   No    Reason for no oral contrast::   No need oral contrast   TSH    Standing Status:   Standing    Number of Occurrences:   22    Expiration Date:   02/07/2025     Lisa Bedford, MD  INTERVAL HISTORY: she returns for surveillance follow-up with her husband She is still struggling to get her blood sugar under control She has intermittent groin discomfort that comes and goes I reviewed imaging studies with the patient and her husband which show no evidence of disease She remains very active She has menopausal symptoms and would like to try hormone replacement therapy again  PHYSICAL EXAMINATION: ECOG PERFORMANCE STATUS: 1 - Symptomatic but completely ambulatory  Vitals:   02/08/24 1208  BP: 122/76  Pulse: 93  Resp: 18  Temp: 98.5 F (36.9 C)  SpO2: 96%   Filed Weights   02/08/24 1208  Weight: 138 lb 12.8 oz (63 kg)    Relevant data reviewed during this visit included CBC, CMP, CT imaging from August 2025

## 2024-02-08 NOTE — Assessment & Plan Note (Addendum)
 She will continue medication adjustment as needed

## 2024-02-08 NOTE — Assessment & Plan Note (Addendum)
 The patient has resources to modify her diet and insulin  level based on activity She will continue to work with her endocrinologist to get her blood sugar under control

## 2024-02-10 IMAGING — US US THYROID
1 series · 13 of 25 positions shown · non-contrast
Comparison: None Available.

CLINICAL DATA: Thyroid enlargement

EXAM:
THYROID ULTRASOUND
TECHNIQUE: Ultrasound examination of the thyroid gland and adjacent soft
tissues was performed.

[Series 1: us thyroid · 0.05mm/px · 13 of 47 slices shown]
[im 1/47]
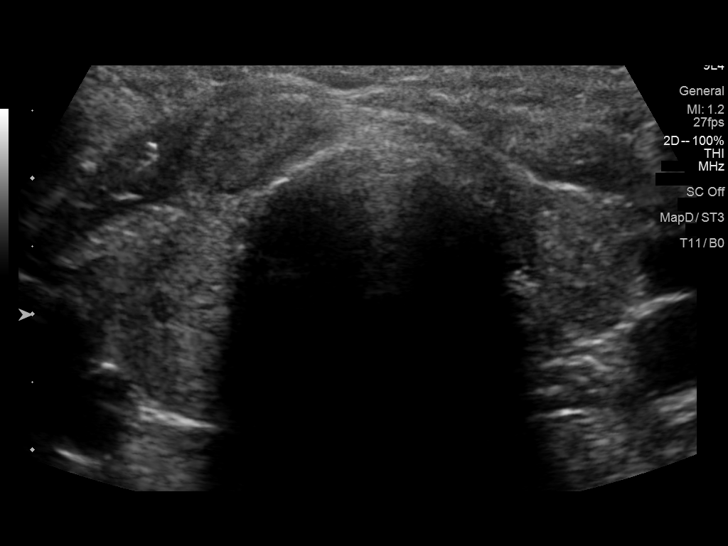
[im 4/47]
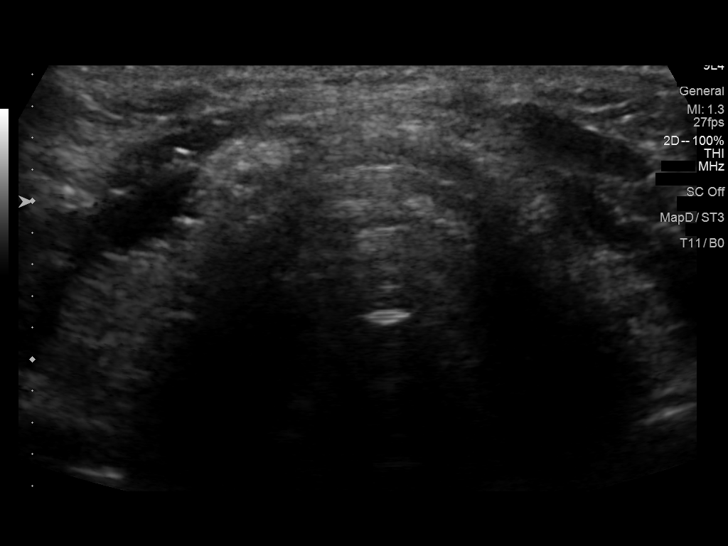
[im 8/47]
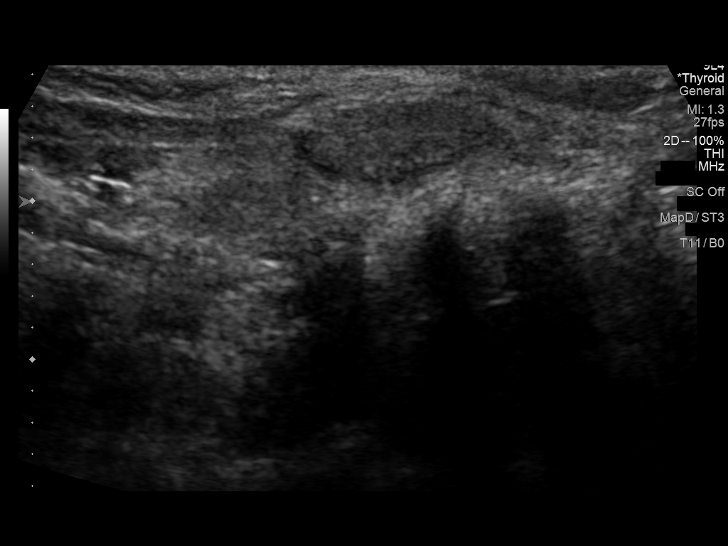
[im 12/47]
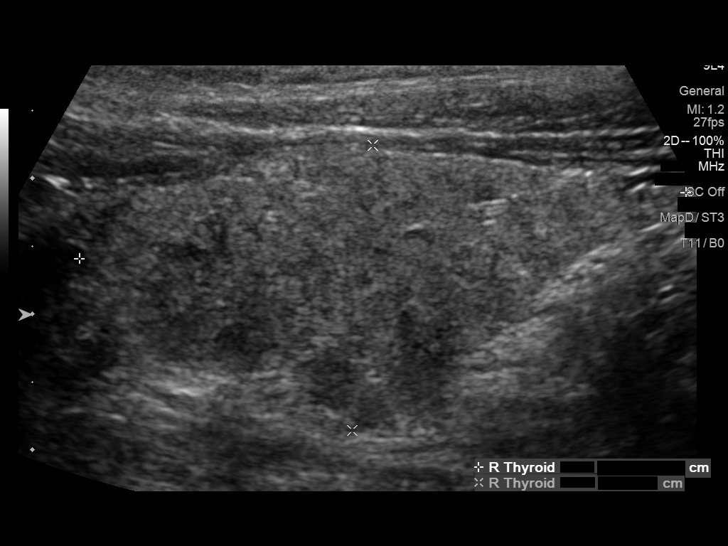
[im 16/47]
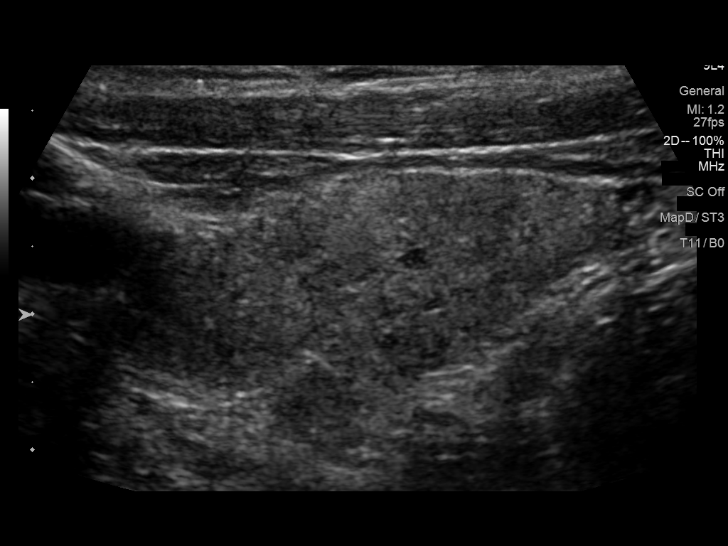
[im 20/47]
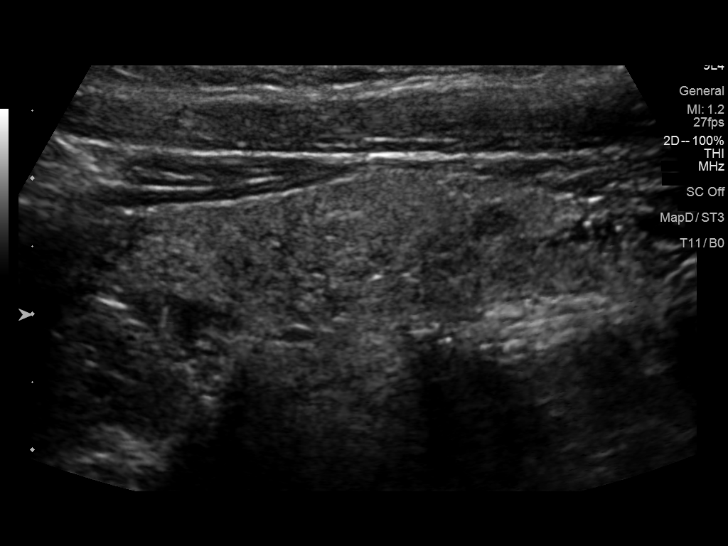
[im 24/47]
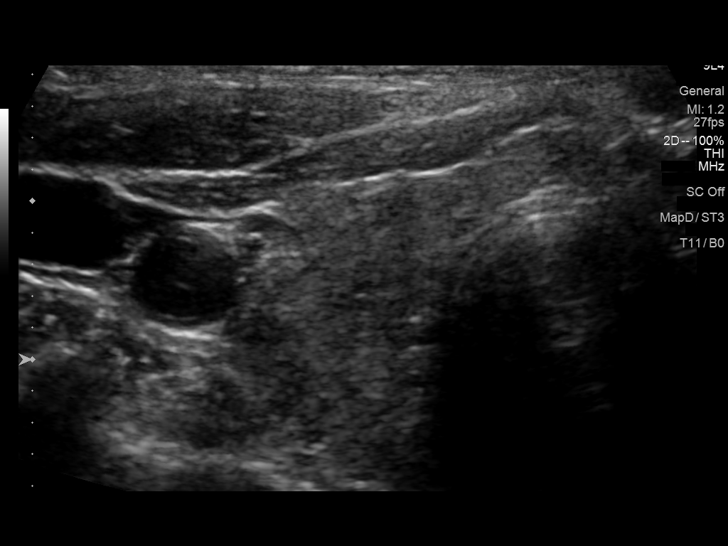
[im 27/47]
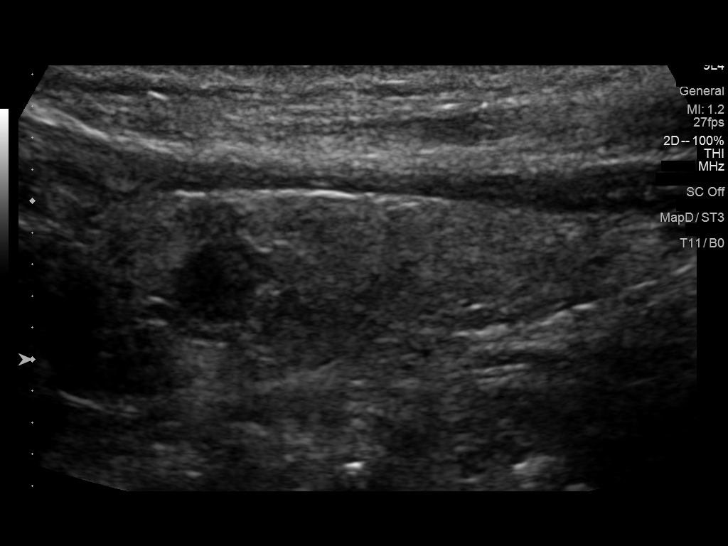
[im 31/47]
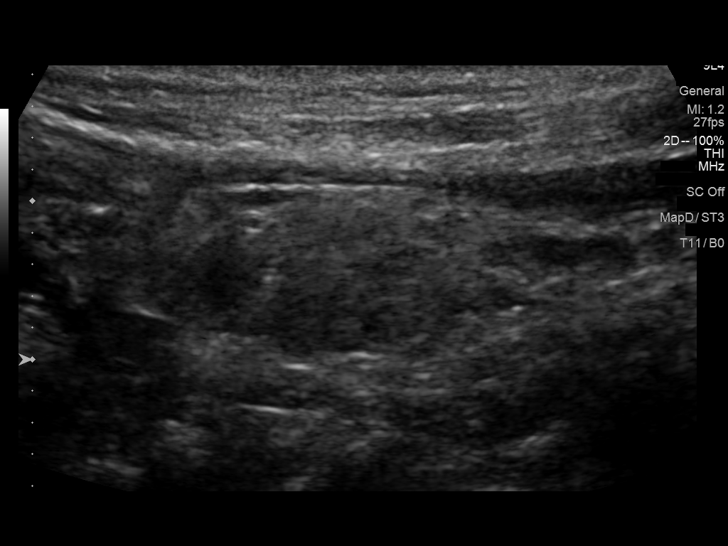
[im 35/47]
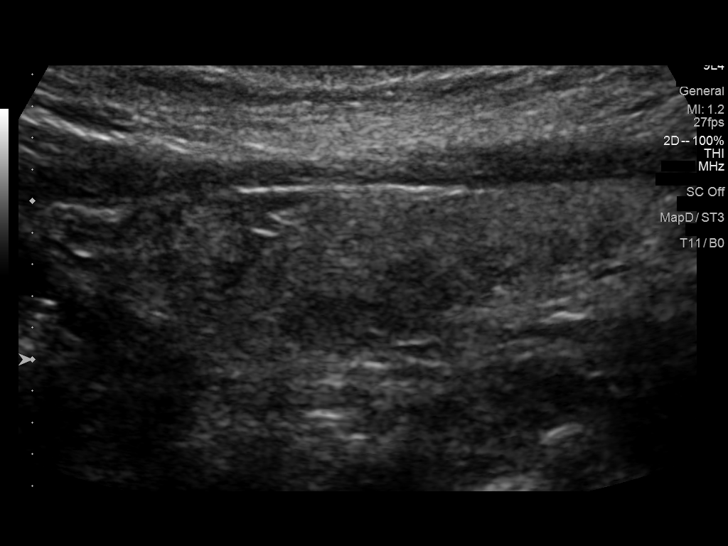
[im 39/47]
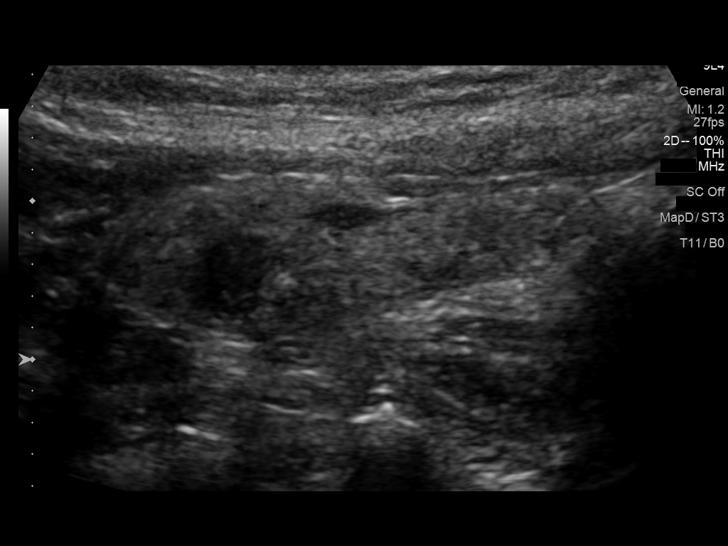
[im 43/47]
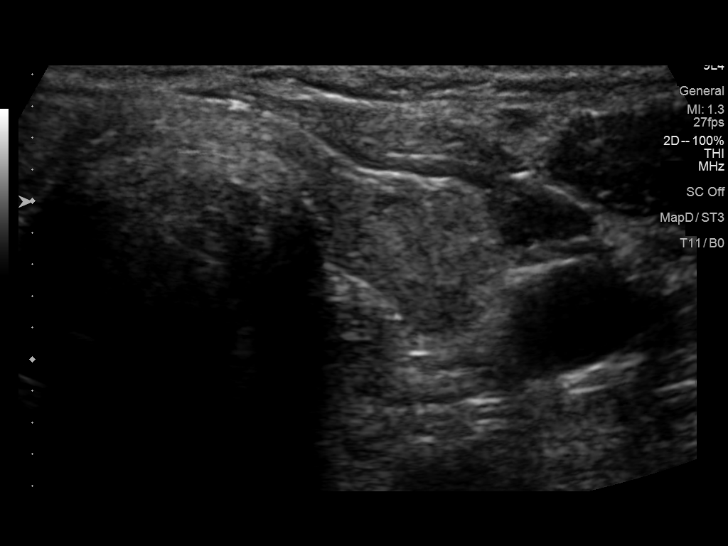
[im 47/47]
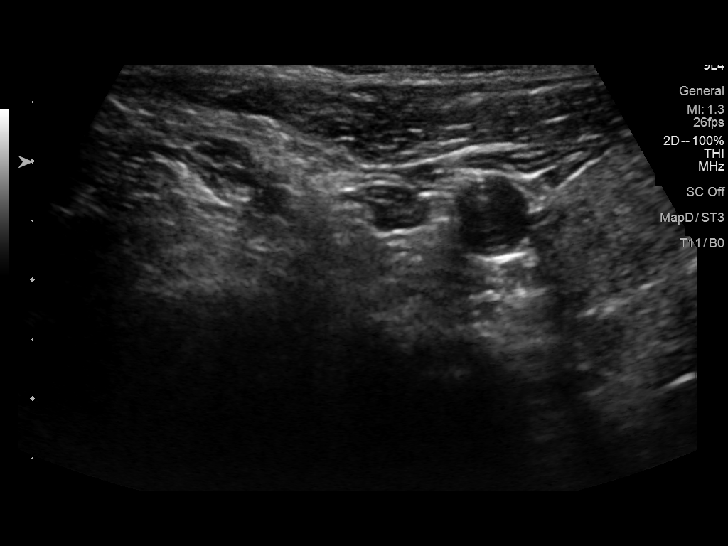

[13 of 25 positions shown; findings below may reference images not displayed]

FINDINGS: Parenchymal Echotexture: Moderately heterogenous

Isthmus: 3 mm

Right lobe: 4.5 x 2.1 x 1.4 cm

Left lobe: 3.9 x 1.2 x 1.2 cm

_________________________________________________________

Estimated total number of nodules >/= 1 cm: 1

Number of spongiform nodules >/=  2 cm not described below (TR1): 0

Number of mixed cystic and solid nodules >/= 1.5 cm not described
below (TR2): 0

_________________________________________________________

Nodule # 1:

Location: Isthmus; right

Maximum size: 1.3 cm; Other 2 dimensions: 1.1 x 0.6 cm

Composition: solid/almost completely solid (2)

Echogenicity: hypoechoic (2)

Shape: not taller-than-wide (0)

Margins: smooth (0)

Echogenic foci: none (0)

ACR TI-RADS total points: 4.

ACR TI-RADS risk category: TR4 (4-6 points).

ACR TI-RADS recommendations:

*Given size (>/= 1 - 1.4 cm) and appearance, a follow-up ultrasound
in 1 year should be considered based on TI-RADS criteria.

_________________________________________________________

Additional subcentimeter hypoechoic nodule in the left thyroid
superiorly measures only 6 mm. This would not meet criteria for any
biopsy or follow-up and is not fully detailed by TI rads criteria.

Moderate thyroid heterogeneity suggesting component of medical
thyroid disease. Correlate with thyroid function tests. No
hypervascularity. No regional adenopathy.
IMPRESSION: 1.3 cm right isthmus TR 4 nodule meets criteria for follow-up in 1
year.

Other findings as above.

The above is in keeping with the ACR TI-RADS recommendations - [HOSPITAL] 4024;[DATE].

## 2024-02-25 ENCOUNTER — Encounter: Payer: Self-pay | Admitting: Internal Medicine

## 2024-02-25 ENCOUNTER — Ambulatory Visit: Admitting: Internal Medicine

## 2024-02-25 VITALS — BP 110/60 | HR 76 | Ht 63.0 in | Wt 141.0 lb

## 2024-02-25 DIAGNOSIS — E1059 Type 1 diabetes mellitus with other circulatory complications: Secondary | ICD-10-CM

## 2024-02-25 DIAGNOSIS — E063 Autoimmune thyroiditis: Secondary | ICD-10-CM

## 2024-02-25 DIAGNOSIS — E042 Nontoxic multinodular goiter: Secondary | ICD-10-CM | POA: Diagnosis not present

## 2024-02-25 LAB — TSH: TSH: 0.94 m[IU]/L (ref 0.40–4.50)

## 2024-02-25 LAB — T4, FREE: Free T4: 1.3 ng/dL (ref 0.8–1.8)

## 2024-02-25 NOTE — Patient Instructions (Addendum)
 Please change: - Lantus  9 units daily and 2 units at night - Novolog  - 15 min before meal: 4 units at waking up 2-4 units before lunch and dinner - NovoLog  sliding scale: 150-200: + 1 unit 201-250: + 2 units 251-300: + 3 units >301-350: + 4 units  Please continue Levothyroxine  125 mcg daily.  Take the thyroid  hormone every day, with water , at least 30 minutes before breakfast, separated by at least 4 hours from: - acid reflux medications - calcium - iron - multivitamins  Please return in 2-3 months.

## 2024-02-25 NOTE — Progress Notes (Addendum)
 Patient ID: Lisa  NAARAH Zavala, female   DOB: 02-02-1951, 73 y.o.   MRN: [id number]  HPI  [name] is a 73 y.o.-year-old female, initially referred by Dr Rockney for management of hypothyroidism, diagnosed as secondary to Hashimoto's thyroiditis, and also thyroid  nodules, diagnosed 11/2021, but also type 1 diabetes, dx'ed 10/2023.  Last visit 1.5 months ago.  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 She started ChTx last spring.  She then started Keytruda , but this was stopped after she developed DKA 10/31/2023.  At that time, she had nausea/vomiting/diarrhea and she was diagnosed with colitis. She was started on basal-bolus insulin  regimen. At today's visit, she describes few days of diarrhea, which started to improve. At today's visit, she is feeling well, and continues to play tennis, and walk for exercise.   Also, after her diagnosis of diabetes, she did a great job adjusting her diet, eating a low-carb diet and reducing highly processed foods.  DM1: - Diagnosed after an admission for DKA in the setting of colitis.  She was on Keytruda  at that time but this was stopped.  Reviewed HbA1c Levels: Lab Results  Component Value Date   HGBA1C 9.3 (A) 01/12/2024   HGBA1C 7.8 (H) 10/31/2023   HGBA1C 7.5 (H) 10/31/2023   HGBA1C 5.5 10/21/2011   At last visit she was on: - Lantus  12 units daily in am - Novolog  5 units before each meal + sliding scale >> 15 minutes before each meal: 3 units before a smaller meal 4 units before a regular meal 5 units before a large meal - NovoLog  sliding scale: 150-200: + 1 unit 201-250: + 2 units 251-300: + 3 units >301-350: + 4 units  We changed to: - Lantus  12 units daily in am and add 8 >> had to decrease the dose to 2 units at bedtime  - Novolog  - 15 min before meal: 2-4 units  - NovoLog  sliding scale: 150-200: + 1 unit 201-250: + 2  units 251-300: + 3 units >301-350: + 4 units  Meter: AccuChek  Pt checks her sugars more than 4 times a day with her CGM:  Prev.:  Lowest sugar was 84 >> 55; she has hypoglycemia awareness at 70 Highest sugar was 320 >> HI. No previous DKA admissions.    Pt's meals are: - Breakfast: Cookie or cracker - Lunch: Leftovers or soup or salad or veggies - Dinner: Meat and veggies - Snacks: sweets >> stopped  - no CKD, last BUN/creatinine:  Lab Results  Component Value Date   BUN 20 01/10/2024   BUN 16 11/19/2023   CREATININE 0.82 01/10/2024   CREATININE 0.82 11/19/2023   No results found for: MICRALBCREAT  - No HL: Last set of lipids:  Lab Results  Component Value Date   CHOL 161 12/14/2016   HDL 71 12/14/2016   LDLCALC 77 12/14/2016   TRIG 65 12/14/2016   CHOLHDL 2.3 12/14/2016  She is not on a statin.  - last eye exam was in 08/2023. No DR reportedly.   - no numbness and tingling in her feet.  Last foot exam 01/12/2024.  She has family history of type 1 diabetes in her sister.  Hypothyroidism: Reviewed history: Patient was diagnosed with hypothyroidism in 10/2014 and started on levothyroxine  50 mcg daily.   We increased the dose to 75 mcg daily in 11/2021. At our visit from 10/2023, she was taking 100  mcg daily. In 12/2023, Dr. Lonn increased her LT4 to 125 mcg daily.  She takes her levothyroxine : - in am (~4 am) - fasting - at least 1h from + b'fast and coffee + creamer - Ca, vit D >4h after LT4 - no Fe, + MVI at dinnertime - no PPIs - stopped Biotin   Reviewed her TFTs: Lab Results  Component Value Date   TSH 8.700 (H) 01/10/2024   TSH 2.140 11/19/2023   TSH 0.780 10/31/2023   TSH 0.803 10/19/2023   TSH 0.812 08/27/2023   TSH 0.951 07/13/2023   TSH 5.086 (H) 06/01/2023   TSH 7.198 (H) 05/06/2023   TSH 0.044 (L) 01/15/2023   TSH 1.119 11/30/2022   FREET4 0.81 01/19/2022   FREET4 0.78 12/01/2021   FREET4 0.80 12/02/2020   FREET4 1.02  01/08/2020   FREET4 0.88 11/28/2018   FREET4 0.90 12/13/2017   FREET4 0.87 12/04/2016   FREET4 0.95 08/28/2016   FREET4 1.01 10/18/2015   FREET4 1.05 04/15/2015    Patient's TPO antibodies were elevated, confirming Hashimoto's thyroiditis Component     Latest Ref Rng 04/15/2015  Thyroperoxidase Ab SerPl-aCnc     <9 IU/mL 151 (H)  Thyroglobulin Ab     <2 IU/mL <1   Thyroid  nodule: - Initially detected on palpation - Reviewed previous investigation:  Thyroid  U/S (12/17/2021): Parenchymal Echotexture: Moderately heterogenous  Isthmus: 3 mm  Right lobe: 4.5 x 2.1 x 1.4 cm  Left lobe: 3.9 x 1.2 x 1.2 cm _________________________________________________________   Nodule # 1:  Location: Isthmus; right  Maximum size: 1.3 cm; Other 2 dimensions: 1.1 x 0.6 cm  Composition: solid/almost completely solid (2)  Echogenicity: hypoechoic (2)  ACR TI-RADS total points: 4.  *Given size (>/= 1 - 1.4 cm) and appearance, a follow-up ultrasound in 1 year should be considered based on TI-RADS criteria.  _________________________________________________________   Additional subcentimeter hypoechoic nodule in the left thyroid  superiorly measures only 6 mm. This would not meet criteria for any biopsy or follow-up and is not fully detailed by TI rads criteria.   Moderate thyroid  heterogeneity suggesting component of medical thyroid  disease. Correlate with thyroid  function tests. No hypervascularity. No regional adenopathy.   IMPRESSION: 1.3 cm right isthmus TR 4 nodule meets criteria for follow-up in 1 year.   Other findings as above.  Thyroid  U/S (01/14/2024): Parenchymal Echotexture: Markedly heterogenous  Isthmus: 0.3 cm  Right lobe: 3.3 x 0.8 x 0.9 cm  Left lobe: 2.3 x 0.6 x 0.8 cm  _________________________________________________________   Estimated total number of nodules >/= 1 cm: 0 _________________________________________________________   Small and diffusely heterogeneous  thyroid  gland. No discrete thyroid  nodules are identified. The previously noted isthmic nodule is no longer visible and appears to have resolved.   IMPRESSION: 1. Interval resolution of previously identified nodule in the thyroid  isthmus. No thyroid  nodules are present on today's exam. 2. Small/atrophied and diffusely heterogeneous thyroid  gland.   Pt denies: - feeling nodules in neck - hoarseness - dysphagia - choking  No FH of thyroid  cancer. No h/o radiation tx to head or neck. No herbal supplements. No Biotin use. No recent steroids use.   She has knee pain.  In 04/2021, she fell and hurt her elbow while playing tennis.  She had a fracture and joint effusion.  This resolved. She was seeing Dr Dolphus (rheum).  She was investigated for possible autoimmune disease in the past, but she does not have a clear diagnosis. She was playing tennis consistently and playing  in tournaments, but now on hold. She had THR in 11/2015.  ROS: + see HPI  I reviewed pt's medications, allergies, PMH, social hx, family hx, and changes were documented in the history of present illness. Otherwise, unchanged from my initial visit note.   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   Top of Left Hand(Tx p Bx)   Past Surgical History:  Procedure Laterality Date   CARPAL TUNNEL RELEASE     BILAT     CATARACT EXTRACTION     BILAT    ECTOPIC PREGNANCY SURGERY     in the setting of prior BTL,  lapartomy   EYE SURGERY     IR IMAGING GUIDED PORT INSERTION  12/02/2022   KNEE CARTILAGE SURGERY     RIGHT    (MENISCUS)   LAPAROTOMY N/A 02/16/2023   Procedure: MINI LAPAROTOMY;  Surgeon: Eldonna Mays, MD;  Location: [facility];  Service: Gynecology;  Laterality: N/A;   LYMPH NODE DISSECTION Right 02/16/2023   Procedure: RIGHT PELVIC LYMPH NODE DISSECTION;  Surgeon: Eldonna Mays, MD;  Location: [facility];  Service: Gynecology;  Laterality: Right;   MOHS SURGERY     left nose/inner eye for basal cell   TONSILLECTOMY     TOTAL HIP ARTHROPLASTY Left 12/19/2015   TOTAL HIP ARTHROPLASTY Left 12/19/2015   Procedure: LEFT TOTAL HIP ARTHROPLASTY;  Surgeon: Maude LELON Right, MD;  Location: [facility];  Service: Orthopedics;  Laterality: Left;   TRIGGER FINGER RELEASE     TUBAL LIGATION     Social History   Social History   Marital Status: Married    Spouse Name: N/A   Number of Children: 2   Occupational History    retired Programmer, systems, now Interior and spatial designer of preschool at [employer]   Social History Main Topics   Smoking status: Never Smoker    Smokeless tobacco: Not on file   Alcohol  Use: 0.0 oz/week    0 Standard drinks or equivalent per week     Comment: Rare   Drug Use: No   Current Outpatient Medications on File Prior to Visit  Medication Sig Dispense Refill   acetaminophen  (TYLENOL ) 500 MG tablet Take 500-1,000 mg by mouth every 6 (six) hours as needed (for muscle pain or soreness).     aspirin  EC 81 MG tablet Take 81 mg by mouth at bedtime. Swallow whole.     Blood Glucose Monitoring Suppl (BLOOD GLUCOSE MONITOR SYSTEM) w/Device KIT Use to check blood sugar three times daily 1 kit 0   calcium carbonate (TUMS) 500 MG chewable tablet Chew 1 tablet by mouth daily.     Carboxymethylcellulose Sodium (THERATEARS OP) Place 1 drop into both eyes 3 (three) times daily.     Cholecalciferol (VITAMIN D ) 50 MCG (2000 UT) tablet Take 2,000 Units by mouth daily.     Continuous Glucose Sensor (FREESTYLE [place]  3  PLUS SENSOR) MISC Inject 1 Device into the skin continuous. Change every 15 days 6 each 3   diclofenac  sodium (VOLTAREN ) 1 % GEL Apply 2-4 g topically 4 (four) times daily. 3 Tube 3   estradiol  (VIVELLE -DOT) 0.05 MG/24HR patch Place 1 patch (0.05 mg total) onto the skin 2 (two) times a week. 8 patch 12   gabapentin  (NEURONTIN ) 100 MG capsule Take 100-200 mg by mouth daily as needed (for hot flashes).     Glucose Blood (BLOOD GLUCOSE TEST STRIPS) STRP Use to check blood sugar three times daily 100 strip 0   insulin  aspart (NOVOLOG ) 100 UNIT/ML FlexPen Inject 4-8 Units into the skin 3 (three) times daily with meals. 30 mL 3   insulin  glargine (LANTUS ) 100 UNIT/ML Solostar Pen Inject 20 Units into the skin daily. 15 mL 3   Insulin  Pen Needle (PEN NEEDLES) 31G X 5 MM MISC Use to inject insulin  as directed 300 each 5   Lancet Device MISC 1 each by Does not apply route 3 (three) times daily. May dispense any manufacturer covered by patient's insurance. 1 each 0   Lancets MISC Use to check blood sugar three times daily 100 each 0   levothyroxine  (SYNTHROID ) 125 MCG tablet Take 1 tablet (125 mcg total) by mouth daily before breakfast. 60 tablet 0   lidocaine -prilocaine  (EMLA ) cream Apply 1 Application topically as needed. (Patient not taking: Reported on 01/12/2024) 30 g 3   melatonin 5 MG TABS Take 5 mg by mouth at bedtime as needed (sleep).     meloxicam  (MOBIC ) 7.5 MG tablet Take 1 tablet (7.5 mg total) by mouth daily. 30 tablet 1   Multiple Vitamin (MULTIVITAMIN) tablet Take 1 tablet by mouth daily with breakfast.     rosuvastatin (CRESTOR) 5 MG tablet Take 5 mg by mouth.     No current facility-administered medications on file prior to visit.   Allergies  Allergen Reactions   Sulfa Antibiotics Swelling and Other (See Comments)    Caused tics and the tongue became swollen for approx a week   Family History  Problem Relation Age of Onset   Uterine cancer Mother 7   Heart attack Mother     Hypertension Mother    Kidney cancer Father 37   Lung cancer Father    Diabetes Sister    Thyroid  disease Neg Hx    Colon cancer Neg Hx    Breast cancer Neg Hx    Ovarian cancer Neg Hx    Endometrial cancer Neg Hx    Pancreatic cancer Neg Hx    Prostate cancer Neg Hx    PE: BP 110/60   Pulse 76   Ht 5' 3 (1.6 m)   Wt 141 lb (64 kg)   SpO2 96%   BMI 24.98 kg/m    Wt Readings from Last 3 Encounters:  02/25/24 141 lb (64 kg)  02/08/24 138 lb 12.8 oz (63 kg)  01/12/24 136 lb 6.4 oz (61.9 kg)   Constitutional: normal weight, in NAD Eyes: EOMI, no exophthalmos ENT: No thyromegaly, no cervical lymphadenopathy Cardiovascular: RRR, No MRG, enlarged R thigh  Respiratory: CTA B Musculoskeletal: no deformities Skin: no rashes Neurological: no tremor with outstretched hands  ASSESSMENT: Insulin -dependent diabetes with complications: - New diagnosis, after she presented in DKA - Aortic atherosclerosis on CT scan (10/31/2023)  Component     Latest Ref Rng 10/31/2023  Glucose-Capillary     70 - 99 mg/dL 850 (H)   C-Peptide  1.1 - 4.4 ng/mL 0.2 (L)     Component     Latest Ref Rng 10/31/2023  Glutamic Acid Decarb Ab     0.0 - 5.0 U/mL <5.0   IA-2 Autoantibodies     U/mL <7.5   ZNT8 Antibodies     U/mL <15    2. Hypothyroidism due to Hashimoto's thyroiditis  3.  Right thyroid  nodule  PLAN:  Insulin -dependent diabetes complicated by DKA - Likely developed in the setting of anti-PD-1 inhibitor for metastatic primary gynecological cancer treatment.  Keytruda  was held afterwards.  We discussed that unfortunately this type of diabetes is usually not reversible.  She had a DKA episode and a low C-peptide, at 0.2, confirming type 1 diabetes.  Her antipancreatic antibodies were not elevated. -She continues on basal-bolus insulin  regimen with Lantus  and NovoLog .  At last visit, sugars appeared to increase continuously and significantly during the night, and peaking around 9 AM,  after which day started to improve but becoming more variable later in the day, with higher blood sugars after approximately 4 PM and then again after 9 PM.  We discussed that this pattern suggested not enough basal insulin  so I advised her to add a Lantus  dose at night.  I felt that this would improve her blood sugars not only during the night but also during the first half of the day.  Regarding the blood sugars later in the day, we discussed about always bolusing NovoLog  15 minutes before meals and we also increased the dose of NovoLog  slightly. - I previously suggested a reference book for further information about type 1 diabetes: Think Like a Pancreas by Arley Alas. CGM interpretation: -At today's visit, we reviewed her CGM downloads: It appears that 70% of values are in target range (goal >70%), while 30% are higher than 180 (goal <25%), and 0% are lower than 70 (goal <4%).  The calculated average blood sugar is 161.  The projected HbA1c for the next 3 months (GMI) is 7.2%. -Reviewing the CGM trends, sugars are much improved compared to last visit particularly overnight but they are increasing after every meal and dropping more abruptly between 8 and 9 PM.  She reports that adding a dose of Lantus  at bedtime was a game changer, which improved her blood sugars overnight significantly.  In fact, they improved to the point of lows so she had to decrease the dose, currently taking approximately 2 units at night.  She continues 12 units in the morning.  However, she also had to decrease the dose of NovoLog  to avoid lows but sugars stay elevated in the middle of the day and drop more abruptly in the evening.  I advised her to decrease the dose of Lantus  in the morning but to try to use a higher dose of NovoLog  before breakfast. In fact, as she noticed that the sugars increased after getting out of bed, even before drinking coffee and eating, I advised her to bolus NovoLog  at waking up, which is  approximately 15 minutes before starting to drink the coffee.  I advised her to take 40 units of insulin  in the a.m. and then to vary 2 to 4 units before the rest of the meals. - she may need to have a steroid injection in her right shoulder for frozen shoulder we discussed that at that time, she may need an increased insulin  doses, sometimes even to double - I advised her to: Patient Instructions  Please change: - Lantus  9 units daily and  2 units at night - Novolog  - 15 min before meal: 4 units at waking up 2-4 units before lunch and dinner - NovoLog  sliding scale: 150-200: + 1 unit 201-250: + 2 units 251-300: + 3 units >301-350: + 4 units  Please continue Levothyroxine  125 mcg daily.  Take the thyroid  hormone every day, with water , at least 30 minutes before breakfast, separated by at least 4 hours from: - acid reflux medications - calcium - iron - multivitamins  Please return in 2-3 months.  - advised to check sugars at different times of the day - 4x a day, rotating check times - advised for yearly eye exams >> she is UTD - return to clinic in 2-3 months  2. Patient with history of autoimmune hypothyroidism, on levothyroxine  therapy - TSH was elevated: Lab Results  Component Value Date   TSH 8.700 (H) 01/10/2024  - she continues on LT4 125 mcg daily, increased by Dr. Lonn from 100 mcg daily after the above results returned - pt feels good on this dose. - we discussed about taking the thyroid  hormone every day, with water , >30 minutes before breakfast, separated by >4 hours from acid reflux medications, calcium, iron, multivitamins. Pt. is taking it correctly. - will check thyroid  tests today: TSH and fT4 - If labs are abnormal, she will need to return for repeat TFTs in 1.5 months  3.  Right thyroid  nodule - Detected on palpation - No neck compression symptoms  - A thyroid  ultrasound showed a 1.3 right, solid, hypoechoic, isthmic nodule which qualified for 1 year  follow-up.  She also had a left 6 mm nodule which did not require follow-up. - A PET scan showed that the whole thyroid  is an increased uptake, consistent with her diagnosis of Hashimoto's thyroiditis, but the thyroid  nodules were not necessarily FDG avid - After last visit, we repeated another ultrasound (01/14/2024), which showed resolution of the isthmic nodule - no further imaging required for now  Component     Latest Ref Rng 02/25/2024  TSH     0.40 - 4.50 mIU/L 0.94   T4,Free(Direct)     0.8 - 1.8 ng/dL 1.3   TFTs are normal -we will advised her to continue with 125 mcg levothyroxine  daily.  Lisa Fendt, MD PhD [facility] Endocrinology

## 2024-02-26 ENCOUNTER — Other Ambulatory Visit: Payer: Self-pay | Admitting: Hematology and Oncology

## 2024-02-29 ENCOUNTER — Encounter: Payer: Self-pay | Admitting: Hematology and Oncology

## 2024-02-29 ENCOUNTER — Ambulatory Visit: Payer: Self-pay | Admitting: Internal Medicine

## 2024-02-29 MED ORDER — LEVOTHYROXINE SODIUM 125 MCG PO TABS: 125.0000 ug | ORAL_TABLET | Freq: Every day | ORAL | 3 refills | Status: AC

## 2024-02-29 NOTE — Addendum Note (Signed)
 Addended by: TRIXIE FILE on: 02/29/2024 08:39 AM   Modules accepted: Orders

## 2024-03-27 ENCOUNTER — Other Ambulatory Visit (INDEPENDENT_AMBULATORY_CARE_PROVIDER_SITE_OTHER): Payer: Self-pay

## 2024-03-27 ENCOUNTER — Other Ambulatory Visit: Payer: Self-pay

## 2024-03-27 ENCOUNTER — Ambulatory Visit: Admitting: Physician Assistant

## 2024-03-27 DIAGNOSIS — M25562 Pain in left knee: Secondary | ICD-10-CM | POA: Diagnosis not present

## 2024-03-27 DIAGNOSIS — M25561 Pain in right knee: Secondary | ICD-10-CM | POA: Diagnosis not present

## 2024-03-27 DIAGNOSIS — G8929 Other chronic pain: Secondary | ICD-10-CM | POA: Diagnosis not present

## 2024-03-27 DIAGNOSIS — M25551 Pain in right hip: Secondary | ICD-10-CM

## 2024-03-27 NOTE — Progress Notes (Signed)
 Office Visit Note   Patient: [name]  RUNETTE SCIFRES           Date of Birth: [birth year]           MRN: [id number] Visit Date: 03/27/2024              Requested by: Alray Loader, MD [id number] [place],  [place] [address] PCP: Alray Loader, MD   Assessment & Plan: Visit Diagnoses:  1. Pain in right hip   2. Chronic pain of right knee   3. Chronic pain of left knee     Plan:  Given her newly diagnosed type 1 diabetes and her hemoglobin A1c being 9.3 we will not recommend cortisone injections.  She has been quite active.  She plays tennis and works out.  She does work on leg strengthening.  Therefore we will recommend viscosupplementation both knees.  Will work on getting approval.  In regards to her hip we reviewed the radiographs with her and told her both clinically and radiographically there is no signs of significant arthritis.  Questions were encouraged and answered.  Follow-Up Instructions: Return for Supplemental injection.   Orders:  Orders Placed This Encounter  Procedures   XR HIP UNILAT W OR W/O PELVIS 2-3 VIEWS RIGHT   XR Knee 1-2 Views Right   XR Knee 1-2 Views Left   No orders of the defined types were placed in this encounter.     Procedures: No procedures performed   Clinical Data: No additional findings.   Subjective: Chief Complaint  Patient presents with   Right Hip - Pain   Right Knee - Pain   Left Knee - Pain    HPI [name] comes in today for right hip pain and bilateral knee pain.  She states she has had some groin pain with lifting her leg.  Pain is worse like getting out of the car.  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  She has had no injury to the hip.  She does state that pain came on after her robotic assisted total hysterectomy.  She does have an appointment with her surgeon soon talk to them and see if they think that the hip pain could be related to  the surgery.  As a result of her cancer treatment she has became type I diabetic.  Last hemoglobin A1c 2 months ago was 9.3. In regards to both knees she last had cortisone injections both knees the 08/05/2022.  She has had increased pain in both knees since undergoing chemo.  She wants to discuss possible viscosupplementation injections both knees.  She notes that her right knee now closed.  She is currently taking gabapentin  and Tylenol  arthritis and meloxicam  for her knees and hip pain.  She feels this helps some.  History of DVT right lower extremity in 2024.  Has residual edema of the right lower leg.  No longer on anticoagulation.  Review of Systems  Neurological:  Negative for numbness.     Objective: Vital Signs: There were no vitals taken for this visit.  Physical Exam Constitutional:      Appearance: She is normal weight. She is not ill-appearing or diaphoretic.  Pulmonary:     Effort: Pulmonary effort is normal.  Psychiatric:        Mood and Affect: Mood normal.     Ortho Exam Bilateral hips: Good range of motion of both  hips without pain.  Passive flexion of the right hip causes no discomfort.  Active flexion causes discomfort right hip.  Tenderness over the trochanteric regions of both hips minimal. Bilateral knees: Good range of motion of both knees.  No abnormal warmth erythema or effusion of either knee.  Slight varus deformity of the right knee only.  No instability valgus varus stress either knee.  Specialty Comments:  No specialty comments available.  Imaging: XR Knee 1-2 Views Right Result Date: 03/27/2024 Right knee: Near bone-on-bone medial compartment lateral joint line with periarticular spurring.  Moderate to moderately severe patellofemoral changes.  No acute fractures or findings.  XR Knee 1-2 Views Left Result Date: 03/27/2024 Left knee 2 views: Tricompartmental arthritis with moderate to moderately severe medial compartmental narrowing.  Lateral joint line  periarticular spurring.  Moderate patellofemoral changes.  Otherwise knee is well located.  No bony lesions or abnormalities.  XR HIP UNILAT W OR W/O PELVIS 2-3 VIEWS RIGHT Result Date: 03/27/2024 AP pelvis and lateral view right hip: Status post left total hip arthroplasty well seated components.  Both hips well located.  Right hip joint well-preserved.  No acute fractures acute findings.    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   Family history of uterine cancer    Family history of kidney cancer    Hot flashes 01/15/2023   Multiple thyroid  nodules 12/21/2022   Acute deep vein thrombosis (DVT) of right lower extremity (HCC) 11/20/2022   Mild anemia 11/20/2022   Uterine cancer (HCC) 11/19/2022   Unilateral primary osteoarthritis, left knee 08/05/2022   Unilateral primary osteoarthritis, right knee 08/05/2022   Bilateral primary osteoarthritis of knee 05/06/2022   Primary osteoarthritis of left hip 12/19/2015   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   Hypothyroidism due to Hashimoto's thyroiditis 10/15/2015   Degenerative disc disease    Arthritis    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   Left Inner Eye(MOH's)    PONV (postoperative nausea and vomiting)    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   Top of Left Hand(Tx p Bx)    Family History  Problem Relation Age of Onset   Uterine cancer Mother 45   Heart attack Mother    Hypertension Mother    Kidney cancer Father 63   Lung cancer Father    Diabetes Sister    Thyroid  disease Neg Hx    Colon cancer Neg Hx    Breast cancer Neg Hx    Ovarian cancer Neg Hx    Endometrial cancer Neg Hx    Pancreatic cancer Neg Hx    Prostate cancer  Neg Hx     Past Surgical History:  Procedure Laterality Date   CARPAL TUNNEL RELEASE     BILAT     CATARACT EXTRACTION     BILAT    ECTOPIC PREGNANCY SURGERY     in the setting of prior BTL, lapartomy   EYE SURGERY     IR IMAGING GUIDED PORT INSERTION  12/02/2022   KNEE CARTILAGE SURGERY     RIGHT    (MENISCUS)   LAPAROTOMY N/A 02/16/2023   Procedure: MINI LAPAROTOMY;  Surgeon: Eldonna Mays, MD;  Location: [facility];  Service: Gynecology;  Laterality: N/A;   LYMPH NODE DISSECTION Right 02/16/2023   Procedure: RIGHT PELVIC LYMPH NODE DISSECTION;  Surgeon: Eldonna Mays, MD;  Location: [facility];  Service: Gynecology;  Laterality: Right;   MOHS SURGERY     left nose/inner eye for basal cell   TONSILLECTOMY     TOTAL HIP ARTHROPLASTY Left 12/19/2015   TOTAL HIP ARTHROPLASTY Left 12/19/2015   Procedure: LEFT TOTAL HIP ARTHROPLASTY;  Surgeon: Maude LELON Right, MD;  Location: [facility];  Service: Orthopedics;  Laterality: Left;   TRIGGER FINGER RELEASE     TUBAL LIGATION     Social History   Occupational History   Not on file  Tobacco Use   Smoking status: Never   Smokeless tobacco: Never  Vaping Use   Vaping status: Never Used  Substance and Sexual Activity   Alcohol  use: Yes    Alcohol /week: 0.0 standard drinks of alcohol     Comment: Rare   Drug use: No   Sexual activity: Yes    Birth control/protection: Surgical, Post-menopausal    Comment: Tubal lig-1st intercourse 73 yo-Fewer than  5 partners

## 2024-03-28 ENCOUNTER — Other Ambulatory Visit: Payer: Self-pay | Admitting: Radiology

## 2024-03-28 DIAGNOSIS — G8929 Other chronic pain: Secondary | ICD-10-CM

## 2024-03-29 ENCOUNTER — Inpatient Hospital Stay: Attending: Gynecologic Oncology

## 2024-03-29 DIAGNOSIS — Z90722 Acquired absence of ovaries, bilateral: Secondary | ICD-10-CM | POA: Diagnosis not present

## 2024-03-29 DIAGNOSIS — R1031 Right lower quadrant pain: Secondary | ICD-10-CM | POA: Insufficient documentation

## 2024-03-29 DIAGNOSIS — E1169 Type 2 diabetes mellitus with other specified complication: Secondary | ICD-10-CM | POA: Diagnosis not present

## 2024-03-29 DIAGNOSIS — Z8542 Personal history of malignant neoplasm of other parts of uterus: Secondary | ICD-10-CM | POA: Diagnosis present

## 2024-03-29 DIAGNOSIS — Z9221 Personal history of antineoplastic chemotherapy: Secondary | ICD-10-CM | POA: Insufficient documentation

## 2024-03-29 DIAGNOSIS — I890 Lymphedema, not elsewhere classified: Secondary | ICD-10-CM | POA: Insufficient documentation

## 2024-03-29 DIAGNOSIS — R600 Localized edema: Secondary | ICD-10-CM | POA: Diagnosis not present

## 2024-03-29 DIAGNOSIS — Z79899 Other long term (current) drug therapy: Secondary | ICD-10-CM | POA: Diagnosis not present

## 2024-03-29 DIAGNOSIS — Z9079 Acquired absence of other genital organ(s): Secondary | ICD-10-CM | POA: Diagnosis not present

## 2024-03-29 DIAGNOSIS — Z90710 Acquired absence of both cervix and uterus: Secondary | ICD-10-CM | POA: Diagnosis not present

## 2024-03-31 ENCOUNTER — Encounter: Payer: Self-pay | Admitting: Hematology and Oncology

## 2024-03-31 ENCOUNTER — Other Ambulatory Visit (HOSPITAL_BASED_OUTPATIENT_CLINIC_OR_DEPARTMENT_OTHER): Payer: Self-pay

## 2024-03-31 ENCOUNTER — Telehealth: Payer: Self-pay

## 2024-03-31 MED ORDER — NYSTATIN 100000 UNIT/ML MT SUSP
5.0000 mL | Freq: Four times a day (QID) | OROMUCOSAL | 0 refills | Status: DC
  Filled 2024-03-31: qty 240, 12d supply, fill #0

## 2024-03-31 NOTE — Telephone Encounter (Signed)
 Called back and she will pick up Rx and understands the directions.

## 2024-03-31 NOTE — Telephone Encounter (Signed)
 Called and left a message that MMW has been sent to [facility] at [facility] along with phone # of pharmacy. Ask her to call the office for questions.

## 2024-04-10 NOTE — Telephone Encounter (Signed)
 Called patient. Scheduled 10/14 at 9am

## 2024-04-11 ENCOUNTER — Encounter: Payer: Self-pay | Admitting: Physician Assistant

## 2024-04-11 ENCOUNTER — Ambulatory Visit: Admitting: Physician Assistant

## 2024-04-11 DIAGNOSIS — M1712 Unilateral primary osteoarthritis, left knee: Secondary | ICD-10-CM

## 2024-04-11 DIAGNOSIS — M1711 Unilateral primary osteoarthritis, right knee: Secondary | ICD-10-CM

## 2024-04-11 DIAGNOSIS — M170 Bilateral primary osteoarthritis of knee: Secondary | ICD-10-CM | POA: Diagnosis not present

## 2024-04-11 MED ORDER — HYALURONAN 88 MG/4ML IX SOSY
88.0000 mg | PREFILLED_SYRINGE | INTRA_ARTICULAR | Status: AC | PRN
  Administered 2024-04-11: 88 mg via INTRA_ARTICULAR

## 2024-04-11 NOTE — Progress Notes (Signed)
   Procedure Note  Patient: [name]             Date of Birth: [birth year]           MRN: [id number]             Visit Date: 04/11/2024 HPI: [name] comes in today for scheduled dose supplementation injections both knees.  She is unable to do cortisone injections with her new diagnosis of type 1 diabetes and a hemoglobin A1c of 9.3.  She is very active and works out often and plays tennis.  She has no scheduled surgery on either knee in the next 6 months.  Physical exam: Bilateral knees no abnormal warmth erythema or effusion.  Overall good range of motion of both knees.  Procedures: Visit Diagnoses:  1. Unilateral primary osteoarthritis, left knee   2. Unilateral primary osteoarthritis, right knee     Large Joint Inj: bilateral knee on 04/11/2024 9:32 AM Indications: pain Details: 22 G 1.5 in needle, anterolateral approach  Arthrogram: No  Medications (Right): 88 mg Hyaluronan 88 MG/4ML Medications (Left): 88 mg Hyaluronan 88 MG/4ML Outcome: tolerated well, no immediate complications Procedure, treatment alternatives, risks and benefits explained, specific risks discussed. Consent was given by the patient. Immediately prior to procedure a time out was called to verify the correct patient, procedure, equipment, support staff and site/side marked as required. Patient was prepped and draped in the usual sterile fashion.     Plan: She knows to wait 6 months between injections with viscosupplementation.  She will continue work on Dance movement psychotherapist.  Follow-up with us  as needed.  Questions encouraged and answered at length.

## 2024-04-17 ENCOUNTER — Ambulatory Visit: Admitting: Psychiatry

## 2024-04-24 ENCOUNTER — Ambulatory Visit: Admitting: Physician Assistant

## 2024-04-24 ENCOUNTER — Inpatient Hospital Stay: Admitting: Psychiatry

## 2024-04-24 ENCOUNTER — Encounter: Payer: Self-pay | Admitting: Psychiatry

## 2024-04-24 VITALS — BP 117/78 | HR 74 | Temp 98.1°F | Resp 20 | Wt 138.6 lb

## 2024-04-24 DIAGNOSIS — R1031 Right lower quadrant pain: Secondary | ICD-10-CM

## 2024-04-24 DIAGNOSIS — I890 Lymphedema, not elsewhere classified: Secondary | ICD-10-CM

## 2024-04-24 DIAGNOSIS — G5781 Other specified mononeuropathies of right lower limb: Secondary | ICD-10-CM

## 2024-04-24 DIAGNOSIS — R600 Localized edema: Secondary | ICD-10-CM

## 2024-04-24 DIAGNOSIS — Z8542 Personal history of malignant neoplasm of other parts of uterus: Secondary | ICD-10-CM

## 2024-04-24 DIAGNOSIS — C549 Malignant neoplasm of corpus uteri, unspecified: Secondary | ICD-10-CM

## 2024-04-24 NOTE — Progress Notes (Signed)
 Gynecologic Oncology Return Clinic Visit  Date of Service: 04/24/2024 Referring Provider: Marie-Lyne Lavoie, MD  [facility]  Assessment & Plan: [name]  [name] is a 73 y.o. 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 Presents today for surveillance.  Gyn malignancy: - Completed adjuvant chemo followed by maintenance pembro, d/c'ed due to IRAE diabetes. - CT C/A/P last on 01/28/24 NED - NED on exam today -Right groin pain could not be reproduced on exam today.  Will get updated CT scan to rule out any cancer recurrence as cause.   - Signs/symptoms of recurrence reviewed.   - Per Dr. Lonn, plan to continue port flush and repeat CT imaging in 49mo but will get repeat CT abdomen/pelvis sooner for groin pain as below. - Recommend q449mo follow-up.   Right groin pain: - CT abdomen/pelvis as above. - Could consider ultrasound to rule out hernia if needed. -Given that this may be neuropathic pain origin and gabapentin  seems to help, recommend increasing gabapentin  to 3 times daily.  She may start at 100 mg in the morning in the afternoon and increase these to 200 mg if doing well.  Can take ultimately 200 mg 3 times daily.  Patient felt that 300 mg causes too much grogginess.  Obturator nerve injury and repair: - No ongoing functional limitations. - Possible that her groin pain above is neuropathic in origin.  Will see how she does with increased gabapentin .  Lymphedema/lower extremity edema: -Stable -Has opted to forego PT for  this.  Hotflashes/nightsweats/poor sleep - Using gabapentin  200 mg nightly which seems to help some.  Continue. - Pt was started on vivelle  dot 0.05mg /24hr by Dr. Lonn (tumor ER neg). Has self discontinued.   RTC 3 mo unless indicated sooner by CT scan results.  [name], MD Gynecologic Oncology   Medical Decision Making I personally spent  TOTAL 30 minutes face-to-face and non-face-to-face in the care of this patient, which includes all pre, intra, and post visit time on the date of service.    ----------------------- Reason for Visit: Surveillance  Treatment History: Oncology History Overview Note  Er neg, MSI stable, PD-L1 80% No genetic testing done, did not meet criteria   Uterine cancer (HCC)  10/23/2022 Imaging   1. 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 2. Multiple prominent palpable lymph nodes in the right groin measuring up to 2.1 cm, nonspecific. 3. Small right popliteal fossa Baker's cyst.   10/25/2022 Imaging   Ct imaging 1. Pelvic lymphadenopathy with the most prominent lymph node along the right iliac chain measuring 5.6 x 2.8 cm. Metastatic lymphadenopathy or lymphoproliferative disease cannot be excluded.  2. 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 3. Prominent right inguinal lymph nodes. 4. 8 mm sclerotic bone lesion within the right ischium, indeterminate. 5. Aortic atherosclerosis.   Aortic Atherosclerosis (ICD10-I70.0).       11/11/2022 Pathology Results   SURGICAL PATHOLOGY CASE: [id number] PATIENT: [name]  [facility] Report  Specimen Submitted: A. Lymph node, right pelvic  Clinical History: Pelvic lymphadenopathy  DIAGNOSIS: A. LYMPH NODE, RIGHT PELVIC; CT-GUIDED CORE NEEDLE BIOPSY: - METASTATIC POORLY  DIFFERENTIATED CARCINOMA, SEE COMMENT.  Comment: Immunohistochemical studies show tumor cells to be positive for CK7, MOC-31, GATA-3, CK5/6, and calretinin (subset). P16 is strongly and diffusely positive. P53 demonstrates an absence of staining within tumor cells, indicative of a null staining pattern. PAX-8, WT-1, D240, TTF-1, cdx-2, and p40 are negative. The pattern of immunohistochemical staining is non-specific. Based on the morphologic features and pattern of immunohistochemical staining the differential diagnosis includes metastatic urothelial carcinoma, metastatic breast carcinoma, and metastatic serous carcinoma of gyn origin. Correlation with radiographic findings is required.  There is sufficient tissue present for ancillary molecular testing.  IHC slides were prepared by [facility], [place], [place]. All controls stained appropriately.   11/16/2022 PET scan   1. Examination is positive for tracer avid adenopathy within the retroperitoneum, bilateral pelvis and right inguinal region. Findings are compatible with nodal metastasis. Primary neoplasm is not apparent on the current exam.  2. No signs of solid organ metastasis within the abdomen or pelvis. 3. No signs of tracer avid disease above the diaphragm. 4. Asymmetric subcutaneous edema is identified involving the visualized portions of the right lower extremity. 5. 5 mm perifissural nodule in the left mid lung is too small to characterize by PET-CT. 6. Increased uptake within both lobes of thyroid  gland, right greater than left. Correlation with thyroid  function tests advised. 7.  Aortic Atherosclerosis (ICD10-I70.0).   11/19/2022 Initial Diagnosis   Gynecologic malignancy (HCC)   11/20/2022 Cancer Staging   Staging form: Corpus Uteri - Carcinoma and Carcinosarcoma, AJCC 8th Edition - Clinical stage from 11/20/2022: FIGO Stage IVB (cTX, cN2a, pM1) - Signed by Lonn Hicks, MD on 11/20/2022 Stage  prefix: Initial diagnosis   12/02/2022 Procedure   Ultrasound and fluoroscopically guided right internal jugular single lumen power port catheter insertion. Tip in the SVC/RA junction. Catheter ready for use.   12/04/2022 - 12/04/2022 Chemotherapy   Patient is on Treatment Plan : UTERINE Carboplatin  AUC 6 + Paclitaxel  q21d     12/04/2022 - 10/19/2023 Chemotherapy   Patient is on Treatment Plan : UTERINE Pembrolizumab  (200), Paclitaxel  (175), Carboplatin  (5) q21d x 6 cycles / Pembrolizumab  (400) q42d     12/15/2022 Imaging   1. No adnexal mass. No uterine mass lesion evident and there is no substantial thickening of the endometrium by MRI. Low signal intensity fibrous stroma of the cervix appears preserved although high signal intensity of the cervical mucosa is somewhat prominent. This could be correlated with Pap smear as clinically warranted. 2. Stable right pelvic sidewall and groin lymphadenopathy. 3. Small Bartholin's cysts bilaterally.   02/05/2023 Imaging   CT CHEST ABDOMEN PELVIS W CONTRAST  Result Date: 02/05/2023 CLINICAL DATA:  History of endometrial cancer, monitor. High-risk. * Tracking Code: BO * EXAM: CT CHEST, ABDOMEN, AND PELVIS WITH CONTRAST TECHNIQUE: Multidetector CT imaging of the chest, abdomen and pelvis was performed following the standard protocol during bolus administration of intravenous contrast. RADIATION DOSE REDUCTION: This exam was performed according to the departmental dose-optimization program which includes automated exposure control, adjustment of the mA and/or kV according to patient size and/or use of iterative reconstruction technique. CONTRAST:  OMNIPAQUE  IOHEXOL  300 MG/ML  SOLN COMPARISON:  Multiple priors including MRI pelvis December 15, 2022 PET-CT Nov 16, 2022 and CT abdomen pelvis October 25, 2022. FINDINGS: CT CHEST FINDINGS Cardiovascular: Right  chest wall Port-A-Cath with the tip in the right atrium. Aortic atherosclerosis. Normal caliber thoracic aorta. No  central pulmonary embolus on this nondedicated study. Normal size heart. No significant pericardial effusion/thickening Mediastinum/Nodes: No suspicious thyroid  nodule. No pathologically enlarged mediastinal, hilar or axillary lymph nodes the esophagus is grossly unremarkable. Lungs/Pleura: Stable scattered tiny pulmonary nodules. For instance: -2 mm right upper lobe pulmonary nodule on image 29/3, unchanged -2 mm pulmonary nodule along the right major fissure on image 73/3, unchanged -4 mm pulmonary nodule along the left major fissure on image 73/3, unchanged. No pleural effusion.  No pneumothorax. Musculoskeletal: No suspicious chest wall lesion. No aggressive lytic or blastic lesion of bone. CT ABDOMEN PELVIS FINDINGS Hepatobiliary: Diffuse hepatic steatosis. Gallbladder is unremarkable. No biliary ductal dilation Pancreas: No pancreatic ductal dilation or evidence of acute inflammation Spleen: No splenomegaly. Adrenals/Urinary Tract: Bilateral adrenal glands appear normal. No hydronephrosis. Kidneys demonstrate symmetric enhancement. No suspicious renal mass. Urinary bladder is not well evaluated due to underdistention and streak artifact from left hip arthroplasty. Stomach/Bowel: No radiopaque enteric contrast material was administered. Hyperdensity along the posterior aspect of the stomach on image 53/2 appears similar on delayed imaging and is compatible with ingested material. No pathologic dilation of large or small bowel. No evidence of acute bowel inflammation. Vascular/Lymphatic: Normal caliber abdominal aorta. Aortic atherosclerosis. The portal, splenic and superior mesenteric veins are patent. Decreased size of the retroperitoneal, bilateral pelvic sidewall/iliac side chain and right inguinal lymph nodes. For reference: -Retrocaval lymph node measures 6 mm in short axis on image 69/2 previously 11 mm -Right sidewall lymph node measures 1.5 cm in short axis on image 110/2 previously 2.9 cm Reproductive:  Evaluation of the pelvic reproductive structures is limited by streak artifact from left hip arthroplasty. Other: No significant abdominopelvic free fluid. No discrete peritoneal or omental nodularity. Musculoskeletal: No aggressive lytic or blastic lesion of bone. Multilevel degenerative changes spine. Left total hip arthroplasty. Degenerative change of the right hip. IMPRESSION: 1. Decreased size of the retroperitoneal, bilateral pelvic sidewall/iliac side chain and right inguinal lymph nodes, compatible with treatment response. 2. Stable scattered tiny pulmonary nodules, nonspecific but favored benign. Suggest continued attention on follow-up imaging 3. No evidence of new or progressive metastatic disease within the chest, abdomen or pelvis. 4. Diffuse hepatic steatosis. 5.  Aortic Atherosclerosis (ICD10-I70.0). Electronically Signed   By: Reyes Holder M.D.   On: 02/05/2023 15:03   XR Wrist Complete Right  Result Date: 01/13/2023 Right wrist  3 views: Right wrist distal radius fracture remains unchanged in overall position alignment.  Fracture is healed.  No acute findings otherwise.     02/16/2023 Pathology Results    SURGICAL PATHOLOGY CASE: [id number] PATIENT: [name]  [facility] Pathology Report  Clinical History: poorly differentiated carcinoma of likely GYN origin  FINAL MICROSCOPIC DIAGNOSIS:  A. UTERUS, CERVIX, BILATERAL FALLOPIAN TUBES, AND OVARIES, RESECTION: Cervix     Nabothian cysts     Negative for dysplasia or malignancy Endometrium     Atrophic     Negative for atypia, hyperplasia or malignancy Myometrium     Unremarkable with focal vascular calcifications     Negative for malignancy Right ovary     Focal paraovarian, inactive endometriosis     Negative for malignancy Left ovary     Unremarkable     Negative for endometriosis or malignancy Right fallopian tube     Not identified in entirely submitted right adnexa Left fallopian tube     Not  identified in entirely submitted  left adnexa  B. RIGHT PELVIC LYMPH NODE, EXCISION: Lymph node with hyalin fibrosis, focal necrosis and dystrophic calcifications Focal perinodal fat necrosis Negative for residual, viable carcinoma See comment  C. SEGMENT OF OBTURATOR NERVE, EXCISION: Benign nerve Negative for malignancy  D. OMENTUM: Benign adipose tissue Negative for malignancy  COMMENT: B. The right pelvic lymph node is entirely submitted and shows large areas of hyalin fibrosis focally associated with necrosis and dystrophic calcifications.  The findings are consistent with tumor regression although no residual, viable carcinoma is identified.     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 No palpable right lymphadenopathy. On entry to abdomen, normal upper abdominal survey with smooth diaphragm, liver, stomach and normal appearing omentum and bowel. Nodule in the gallbladder fossa (image in media). In the pelvis, filmy adhesions of the sigmoid colon to the left pelvic sidewall. Otherwise small, normal appearing uterus and bilateral fallopian tubes and ovaries. Right pelvic sidewall with enlarged lymph node, approximately 4x2cm, densely adherent to the pelvic sidewall, external iliac vein. Densely adherent and encasing the obturator nerve and artery. With careful dissection, unavoidable avulsion injury occurred to the obturator artery. Due to the densely adherent nature of the lymph node, the nerve and artery were initially  inseparable. In obtaining control of the bleeding from the obturator artery, unavoidable crush and cautery injury to the obturator nerve. Following removal of the involved node, the affected portion of the obturator nerve was resected and well reapproximated. No other gross evidence of disease in the abdomen or pelvis.    05/21/2023 Imaging   CT CHEST ABDOMEN PELVIS W CONTRAST  Result Date: 05/21/2023 CLINICAL DATA:  History of endometrial cancer, monitor. * Tracking Code: BO *. EXAM: CT CHEST, ABDOMEN, AND PELVIS WITH CONTRAST TECHNIQUE: Multidetector CT imaging of the chest, abdomen and pelvis was performed following the standard protocol during bolus administration of intravenous contrast. RADIATION DOSE REDUCTION: This exam was performed according to the departmental dose-optimization program which includes automated exposure control, adjustment of the mA and/or kV according to patient size and/or use of iterative reconstruction technique. CONTRAST:  OMNIPAQUE  IOHEXOL  300 MG/ML  SOLN COMPARISON:  Multiple priors including most recent CT February 05, 2023. FINDINGS: CT CHEST FINDINGS Cardiovascular: Accessed right chest Port-A-Cath with tip in the right atrium. Aortic atherosclerosis. No central pulmonary embolus on this nondedicated study. Normal size heart. No significant pericardial effusion/thickening. Mediastinum/Nodes: No suspicious thyroid  nodule. No pathologically enlarged mediastinal, hilar or axillary lymph nodes. The esophagus is grossly unremarkable. Lungs/Pleura: Stable scattered tiny pulmonary nodules. No new suspicious pulmonary nodules or masses. For reference: -right upper lobe pulmonary nodule measuring 2 mm on image 27/302 -pulmonary nodule along the right major fissure measuring 2 mm on image 66/302 -pulmonary nodule along the left major fissure measuring 4 mm on image 63/302. Musculoskeletal: No aggressive lytic or blastic lesion of bone. CT ABDOMEN PELVIS FINDINGS Hepatobiliary: No  suspicious hepatic lesion. Gallbladder is unremarkable. No biliary ductal dilation. Pancreas: No pancreatic ductal dilation or evidence of acute inflammation. Spleen: No splenomegaly. Adrenals/Urinary Tract: Bilateral adrenal glands appear normal. No hydronephrosis. Kidneys demonstrate symmetric enhancement. Urinary bladder not well  evaluated due to streak artifact from left hip arthroplasty. Stomach/Bowel: No radiopaque enteric contrast material administered. Stomach is unremarkable for degree of distension. No pathologic dilation of small or large bowel. No evidence of acute bowel inflammation. Vascular/Lymphatic: Normal caliber abdominal aorta. Aortic atherosclerosis. The portal, splenic and superior mesenteric veins are patent. Decreased size of the retroperitoneal, pelvic and inguinal lymph nodes although evaluation is limited by streak artifact from left hip arthroplasty. For reference: -retrocaval lymph node measures 6 mm on image 70/301 previously 11 mm -right pelvic sidewall lymph node measures 13 mm in short axis on image 103/301 previously 15 mm. -right inguinal lymph node measures 7 mm in short axis on image 121/301 previously 9 mm Reproductive: Evaluation of the pelvic structures is limited by streak artifact from left hip arthroplasty. Other: No significant abdominopelvic free fluid. No discrete peritoneal or omental nodularity. Musculoskeletal: No aggressive lytic or blastic lesion of bone. Left total hip arthroplasty. Similar sclerotic foci in the left iliac bone and right acetabulum favored to reflect bone islands. IMPRESSION: 1. Decreased size of the retroperitoneal pelvic and inguinal lymph nodes. 2. Stable scattered tiny pulmonary nodules, favored benign. 3. No evidence of new or progressive disease in the chest, abdomen or pelvis. 4.  Aortic Atherosclerosis (ICD10-I70.0). Electronically Signed   By: Reyes Holder M.D.   On: 05/21/2023 16:26      10/31/2023 Imaging   Mild pericolonic  inflammatory stranding surrounding the hepatic flexure of the colon possibly reflecting a mild infectious or inflammatory colitis. No evidence of obstruction or perforation.   01/28/2024 Imaging   CT CHEST ABDOMEN PELVIS W CONTRAST Result Date: 02/07/2024 EXAM: CT CHEST, ABDOMEN AND PELVIS WITH CONTRAST 01/28/2024 03:42:33 PM TECHNIQUE: CT of the chest, abdomen and pelvis was performed with the administration of intravenous contrast. Multiplanar reformatted images are provided for review. Automated exposure control, iterative reconstruction, and/or weight based adjustment of the mA/kV was utilized to reduce the radiation dose to as low as reasonably achievable. COMPARISON: CTAB 10/31/2023 and CT chest, abdomen, and pelvis 05/21/2023. CLINICAL HISTORY: Staging uterine cancer. Malignant neoplasm of uterus. FINDINGS: CHEST: MEDIASTINUM: Mild cardiac enlargement. No enlarged mediastinum or hilar lymph nodes. THORACIC LYMPH NODES: No mediastinal, hilar or axillary lymphadenopathy. LUNGS AND PLEURA: Stable subpleural nodule within the lung superior segment of the left lower lobe abutting the oblique fissure measuring 6 mm, image 68/302. Several additional less than 5 mm lung nodules are also unchanged in the interval. no new or suspicious lung nodules. No pleural effusion or pneumothorax. ABDOMEN AND PELVIS: LIVER: The liver is unremarkable. GALLBLADDER AND BILE DUCTS: Gallbladder is unremarkable. No biliary ductal dilatation. SPLEEN: No acute abnormality. PANCREAS: No acute abnormality. ADRENAL GLANDS: No acute abnormality. KIDNEYS, URETERS AND BLADDER: No stones in the kidneys or ureters. No hydronephrosis. No perinephric or periureteral stranding. Urinary bladder is unremarkable. GI AND BOWEL: No pathologic dilatation of the bowel loops. Mild diffuse stool burden noted within the colon. There is no bowel obstruction. REPRODUCTIVE ORGANS: Uterus appears surgically absent. No adnexal mass identified. PERITONEUM AND  RETROPERITONEUM: No ascites. No peritoneal nodule or mass identified. VASCULATURE: Aortic atherosclerotic calcification. ABDOMINAL AND PELVIS LYMPH NODES: No enlarged lymph nodes within the abdomen. The left pelvic sidewall lymph node is unchanged, measuring 9 mm (image 104/301). 1 cm right inguinal lymph node is stable, image 121/3. REPRODUCTIVE ORGANS: Uterus appears surgically absent. No adnexal mass identified. BONES AND SOFT TISSUES: Previous left hip arthroplasty. Stable sclerotic lesion within the right ischium measuring 7 mm, favored to represent a  benign bone island, image 118/301. Small bone island within the left iliac bone is also unchanged, image 95/301. No acute or suspicious osseous lesions. No focal soft tissue abnormality. IMPRESSION: 1. No evidence of metastatic disease in the chest, abdomen, or pelvis. 2. Stable subpleural nodule within the lung superior segment of the left lower lobe abutting the oblique fissure measuring 6 mm, with several additional less than 5 mm lung nodules unchanged in the interval. No new or suspicious lung nodules. Electronically signed by: Waddell Calk MD 02/07/2024 05:29 AM EDT RP Workstation: [id number]   US  THYROID  Result Date: 01/22/2024 CLINICAL DATA:  Prior ultrasound follow-up. History of isthmic thyroid  nodule seen in 2023. EXAM: THYROID  ULTRASOUND TECHNIQUE: Ultrasound examination of the thyroid  gland and adjacent soft tissues was performed. COMPARISON:  Prior thyroid  ultrasound 12/16/2021 FINDINGS: Parenchymal Echotexture: Markedly heterogenous Isthmus: 0.3 cm Right lobe: 3.3 x 0.8 x 0.9 cm Left lobe: 2.3 x 0.6 x 0.8 cm _________________________________________________________ Estimated total number of nodules >/= 1 cm: 0 Number of spongiform nodules >/=  2 cm not described below (TR1): 0 Number of mixed cystic and solid nodules >/= 1.5 cm not described below (TR2): 0 _________________________________________________________ Small and diffusely  heterogeneous thyroid  gland. No discrete thyroid  nodules are identified. The previously noted isthmic nodule is no longer visible and appears to have resolved. IMPRESSION: 1. Interval resolution of previously identified nodule in the thyroid  isthmus. No thyroid  nodules are present on today's exam. 2. Small/atrophied and diffusely heterogeneous thyroid  gland. The above is in keeping with the ACR TI-RADS recommendations - J Am Coll Radiol 2017;14:587-595. Electronically Signed   By: Wilkie Lent M.D.   On: 01/22/2024 09:38        Interval History: Patient reports that over the past 4 to 5 weeks she has been experiencing right groin pain.  She went to her orthopedic to see if this was due to her hip and they felt that her hip was fine.  She reports that the pain is throbbing and pulsing.  It is worse with pressure on the area, walking, standing for a long period of time, laying in bed.  She has been taking her gabapentin  100 to 200 mg nightly.  She takes more on the day she plays tennis.  The 200 might seem to work a little bit better.  300 mg was causing too much grogginess before.  She also takes Tylenol  at night and in the morning when she plays tennis which helps her to continue to be active and play tennis.  Also notes a little bit more swelling in her legs.  Tried the Vivelle -Dot but did feel like this made a huge difference so she has since stopped it.  Also had mouth sores, thrush and just finished Magic mouthwash which treated this effectively.   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  Top of Left Hand(Tx p Bx)    Past Surgical History:  Procedure Laterality Date   CARPAL TUNNEL RELEASE     BILAT     CATARACT EXTRACTION     BILAT    ECTOPIC PREGNANCY SURGERY     in the setting of prior BTL, lapartomy   EYE SURGERY     IR IMAGING GUIDED PORT INSERTION  12/02/2022   KNEE CARTILAGE SURGERY     RIGHT    (MENISCUS)   LAPAROTOMY N/A 02/16/2023   Procedure: MINI LAPAROTOMY;  Surgeon: Eldonna Mays, MD;  Location: [facility];  Service: Gynecology;  Laterality: N/A;   LYMPH NODE DISSECTION Right 02/16/2023   Procedure: RIGHT PELVIC LYMPH NODE DISSECTION;  Surgeon: Eldonna Mays, MD;  Location: [facility];  Service: Gynecology;  Laterality: Right;   MOHS SURGERY     left nose/inner eye for basal cell   TONSILLECTOMY     TOTAL HIP ARTHROPLASTY Left 12/19/2015   TOTAL HIP ARTHROPLASTY Left 12/19/2015   Procedure: LEFT TOTAL HIP ARTHROPLASTY;  Surgeon: Maude LELON Right, MD;  Location: [facility] OR;  Service: Orthopedics;  Laterality: Left;   TRIGGER FINGER RELEASE     TUBAL LIGATION      Family History  Problem Relation Age of Onset   Uterine cancer Mother 7   Heart attack Mother    Hypertension Mother    Kidney cancer Father 8   Lung cancer Father    Diabetes Sister    Thyroid  disease Neg Hx    Colon cancer Neg Hx    Breast cancer Neg Hx    Ovarian cancer Neg Hx    Endometrial cancer Neg Hx    Pancreatic cancer Neg Hx    Prostate cancer Neg Hx     Social History   Socioeconomic History   Marital status: Married    Spouse name: Not on file   Number of children: Not on file   Years of education: Not on file   Highest education level: Not on file  Occupational History   Not on file  Tobacco Use   Smoking status: Never   Smokeless tobacco: Never  Vaping Use   Vaping status: Never Used  Substance and Sexual Activity    Alcohol  use: Yes    Alcohol /week: 0.0 standard drinks of alcohol     Comment: Rare   Drug use: No   Sexual activity: Yes    Birth control/protection: Surgical, Post-menopausal    Comment: Tubal lig-1st intercourse 73 yo-Fewer than 5 partners  Other Topics Concern   Not on file  Social History Narrative   Not on file   Social Drivers of Health   Financial Resource Strain: Not on file  Food Insecurity: Low Risk  (12/20/2023)   Received from [facility]   Hunger Vital Sign    Within the past 12 months, you worried that your food would run out before you got money to buy more: Never true    Within the past 12 months, the food you bought just didn't last and you didn't have money to get more. : Never true  Transportation Needs: No Transportation Needs (12/20/2023)   Received from [facility]    In the past 12 months, has lack of reliable transportation kept you from medical appointments, meetings, work or from getting things needed for daily living? : No  Physical Activity: Not on file  Stress: Not on file  Social Connections: Socially Integrated (10/31/2023)   Social Connection and  Isolation Panel    Frequency of Communication with Friends and Family: More than three times a week    Frequency of Social Gatherings with Friends and Family: Three times a week    Attends Religious Services: More than 4 times per year    Active Member of Clubs or Organizations: Yes    Attends Banker Meetings: More than 4 times per year    Marital Status: Married    Current Medications:  Current Outpatient Medications:    acetaminophen  (TYLENOL ) 500 MG tablet, Take 500-1,000 mg by mouth every 6 (six) hours as needed (for muscle pain or soreness)., Disp: , Rfl:    aspirin  EC 81 MG tablet, Take 81 mg by mouth at bedtime. Swallow whole., Disp: , Rfl:    Blood Glucose Monitoring Suppl (BLOOD GLUCOSE MONITOR SYSTEM) w/Device KIT, Use to check blood sugar three times daily,  Disp: 1 kit, Rfl: 0   calcium carbonate (TUMS) 500 MG chewable tablet, Chew 1 tablet by mouth daily., Disp: , Rfl:    Carboxymethylcellulose Sodium (THERATEARS OP), Place 1 drop into both eyes 3 (three) times daily., Disp: , Rfl:    Cholecalciferol (VITAMIN D ) 50 MCG (2000 UT) tablet, Take 2,000 Units by mouth daily., Disp: , Rfl:    Continuous Glucose Sensor (FREESTYLE LIBRE 3 PLUS SENSOR) MISC, Inject 1 Device into the skin continuous. Change every 15 days, Disp: 6 each, Rfl: 3   diclofenac  sodium (VOLTAREN ) 1 % GEL, Apply 2-4 g topically 4 (four) times daily., Disp: 3 Tube, Rfl: 3   gabapentin  (NEURONTIN ) 100 MG capsule, Take 100-200 mg by mouth daily as needed (for hot flashes)., Disp: , Rfl:    Glucose Blood (BLOOD GLUCOSE TEST STRIPS) STRP, Use to check blood sugar three times daily, Disp: 100 strip, Rfl: 0   insulin  aspart (NOVOLOG ) 100 UNIT/ML FlexPen, Inject 4-8 Units into the skin 3 (three) times daily with meals., Disp: 30 mL, Rfl: 3   insulin  glargine (LANTUS ) 100 UNIT/ML Solostar Pen, Inject 20 Units into the skin daily., Disp: 15 mL, Rfl: 3   Insulin  Pen Needle (PEN NEEDLES) 31G X 5 MM MISC, Use to inject insulin  as directed, Disp: 300 each, Rfl: 5   Lancet Device MISC, 1 each by Does not apply route 3 (three) times daily. May dispense any manufacturer covered by patient's insurance., Disp: 1 each, Rfl: 0   Lancets MISC, Use to check blood sugar three times daily, Disp: 100 each, Rfl: 0   levothyroxine  (SYNTHROID ) 125 MCG tablet, Take 1 tablet (125 mcg total) by mouth daily before breakfast., Disp: 90 tablet, Rfl: 3   lidocaine -prilocaine  (EMLA ) cream, Apply 1 Application topically as needed., Disp: 30 g, Rfl: 3   melatonin 5 MG TABS, Take 5 mg by mouth at bedtime as needed (sleep)., Disp: , Rfl:    meloxicam  (MOBIC ) 7.5 MG tablet, Take 1 tablet (7.5 mg total) by mouth daily. (Patient taking differently: Take 7.5 mg by mouth daily. Only as needed), Disp: 30 tablet, Rfl: 1   Multiple  Vitamin (MULTIVITAMIN) tablet, Take 1 tablet by mouth daily with breakfast., Disp: , Rfl:    rosuvastatin (CRESTOR) 5 MG tablet, Take 5 mg by mouth., Disp: , Rfl:    estradiol  (VIVELLE -DOT) 0.05 MG/24HR patch, Place 1 patch (0.05 mg total) onto the skin 2 (two) times a week. (Patient not taking: Reported on 04/19/2024), Disp: 8 patch, Rfl: 12   magic mouthwash (nystatin, lidocaine , diphenhydrAMINE , alum & mag hydroxide) suspension, Swish and spit 5 mLs  by mouth 4 (four) times daily. (Patient not taking: Reported on 04/19/2024), Disp: 240 mL, Rfl: 0  Review of Symptoms: Complete 10-system review is positive for: Mouth sore, groin pain, few hot flashes  Physical Exam: BP 117/78 (BP Location: Right Arm, Patient Position: Sitting)   Pulse 74   Temp 98.1 F (36.7 C) (Oral)   Resp 20   Wt 138 lb 9.6 oz (62.9 kg)   SpO2 99%   BMI 24.55 kg/m  General: Alert, oriented, no acute distress. HEENT: Normocephalic, atraumatic. Neck symmetric without masses. Sclera anicteric.  Chest: Normal work of breathing. Clear to auscultation bilaterally.   Cardiovascular: Regular rate and rhythm, no murmurs. Abdomen: Soft, nontender.  Normoactive bowel sounds.  No masses appreciated.  Well-healed incisions. Extremities: Grossly normal range of motion. Warm, well perfused.  Non pitting edema in bilateral lower extremities. Increased varicosities in RLE.  Skin: No rashes or lesions noted. Lymphatics: No cervical, supraclavicular adenopathy. Palpable but not enlarged right inguinal lymph node.  No clear hernia in right groin. GU: Normal appearing external genitalia without erythema, excoriation, or lesions.  Speculum exam reveals well-healed vaginal cuff, no lesions. Bimanual exam reveals smooth vaginal cuff, no nodularity or pelvic mass.  Exam chaperoned by Kimberly Jordan, CMA     Laboratory & Radiologic Studies: CT CHEST ABDOMEN PELVIS W CONTRAST 01/28/2024  Narrative EXAM: CT CHEST, ABDOMEN AND PELVIS WITH  CONTRAST 01/28/2024 03:42:33 PM  TECHNIQUE: CT of the chest, abdomen and pelvis was performed with the administration of intravenous contrast. Multiplanar reformatted images are provided for review. Automated exposure control, iterative reconstruction, and/or weight based adjustment of the mA/kV was utilized to reduce the radiation dose to as low as reasonably achievable.  COMPARISON: CTAB 10/31/2023 and CT chest, abdomen, and pelvis 05/21/2023.  CLINICAL HISTORY: Staging uterine cancer. Malignant neoplasm of uterus.  FINDINGS:  CHEST:  MEDIASTINUM: Mild cardiac enlargement. No enlarged mediastinum or hilar lymph nodes.  THORACIC LYMPH NODES: No mediastinal, hilar or axillary lymphadenopathy.  LUNGS AND PLEURA: Stable subpleural nodule within the lung superior segment of the left lower lobe abutting the oblique fissure measuring 6 mm, image 68/302. Several additional less than 5 mm lung nodules are also unchanged in the interval. no new or suspicious lung nodules. No pleural effusion or pneumothorax.  ABDOMEN AND PELVIS:  LIVER: The liver is unremarkable.  GALLBLADDER AND BILE DUCTS: Gallbladder is unremarkable. No biliary ductal dilatation.  SPLEEN: No acute abnormality.  PANCREAS: No acute abnormality.  ADRENAL GLANDS: No acute abnormality.  KIDNEYS, URETERS AND BLADDER: No stones in the kidneys or ureters. No hydronephrosis. No perinephric or periureteral stranding. Urinary bladder is unremarkable.  GI AND BOWEL: No pathologic dilatation of the bowel loops. Mild diffuse stool burden noted within the colon. There is no bowel obstruction.  REPRODUCTIVE ORGANS: Uterus appears surgically absent. No adnexal mass identified.  PERITONEUM AND RETROPERITONEUM: No ascites. No peritoneal nodule or mass identified.  VASCULATURE: Aortic atherosclerotic calcification.  ABDOMINAL AND PELVIS LYMPH NODES: No enlarged lymph nodes within the abdomen. The left pelvic  sidewall lymph node is unchanged, measuring 9 mm (image 104/301). 1 cm right inguinal lymph node is stable, image 121/3.  REPRODUCTIVE ORGANS: Uterus appears surgically absent. No adnexal mass identified.  BONES AND SOFT TISSUES: Previous left hip arthroplasty. Stable sclerotic lesion within the right ischium measuring 7 mm, favored to represent a benign bone island, image 118/301. Small bone island within the left iliac bone is also unchanged, image 95/301. No acute or suspicious osseous lesions. No focal  soft tissue abnormality.  IMPRESSION: 1. No evidence of metastatic disease in the chest, abdomen, or pelvis. 2. Stable subpleural nodule within the lung superior segment of the left lower lobe abutting the oblique fissure measuring 6 mm, with several additional less than 5 mm lung nodules unchanged in the interval. No new or suspicious lung nodules.  Electronically signed by: Waddell Calk MD 02/07/2024 05:29 AM EDT RP Workstation: [id number]

## 2024-04-24 NOTE — Patient Instructions (Signed)
 It was a pleasure to see you in clinic today. - Add a morning and afternoon dose of the gabapentin . You can start with 100mg  and if this goes well can increase to 200mg  (so ultimately 200mg  three times a day) - CT scan ordered - Return visit planned for 69mo unless indicated sooner by ct scan  Thank you very much for allowing me to provide care for you today.  I appreciate your confidence in choosing our Gynecologic Oncology team at [facility].  If you have any questions about your visit today please call our office or send us  a MyChart message and we will get back to you as soon as possible.

## 2024-04-27 ENCOUNTER — Ambulatory Visit: Admitting: Physician Assistant

## 2024-05-01 ENCOUNTER — Encounter: Payer: Self-pay | Admitting: Radiology

## 2024-05-08 ENCOUNTER — Ambulatory Visit (HOSPITAL_BASED_OUTPATIENT_CLINIC_OR_DEPARTMENT_OTHER)

## 2024-05-09 ENCOUNTER — Ambulatory Visit (HOSPITAL_BASED_OUTPATIENT_CLINIC_OR_DEPARTMENT_OTHER)
Admission: RE | Admit: 2024-05-09 | Discharge: 2024-05-09 | Disposition: A | Discharge status: Home / Self Care | Source: Ambulatory Visit | Attending: Psychiatry | Admitting: Psychiatry

## 2024-05-09 ENCOUNTER — Inpatient Hospital Stay

## 2024-05-09 DIAGNOSIS — C549 Malignant neoplasm of corpus uteri, unspecified: Secondary | ICD-10-CM | POA: Insufficient documentation

## 2024-05-09 DIAGNOSIS — R1031 Right lower quadrant pain: Secondary | ICD-10-CM | POA: Insufficient documentation

## 2024-05-09 LAB — POCT I-STAT CREATININE: Creatinine, Ser: 0.9 mg/dL (ref 0.44–1.00)

## 2024-05-09 MED ORDER — IOHEXOL 300 MG/ML  SOLN
75.0000 mL | Freq: Once | INTRAMUSCULAR | Status: AC | PRN
  Administered 2024-05-09: 75 mL via INTRAVENOUS

## 2024-05-09 MED ORDER — HEPARIN SOD (PORK) LOCK FLUSH 100 UNIT/ML IV SOLN
500.0000 [IU] | Freq: Once | INTRAVENOUS | Status: AC
Start: 2024-05-09 — End: 2024-05-09
  Administered 2024-05-09: 500 [IU] via INTRAVENOUS

## 2024-05-16 ENCOUNTER — Encounter: Payer: Self-pay | Admitting: Hematology and Oncology

## 2024-05-16 ENCOUNTER — Telehealth: Payer: Self-pay | Admitting: Psychiatry

## 2024-05-16 DIAGNOSIS — R590 Localized enlarged lymph nodes: Secondary | ICD-10-CM

## 2024-05-16 MED ORDER — DULOXETINE HCL 30 MG PO CPEP: 30.0000 mg | ORAL_CAPSULE | Freq: Every day | ORAL | 3 refills | Status: DC

## 2024-05-16 NOTE — Telephone Encounter (Signed)
 Called pt with CT scan results. Recommend PET to further evaluate the lymph nodes.   Pt also with right neuropathic pain. Stopped gaba due to mouth sores. Did think the gaba helped the pain. Will try something different. Rx for cymbalta 30mg . Also recommend following back up with PT. She will call us  back if she needs a new order.

## 2024-05-19 ENCOUNTER — Other Ambulatory Visit: Payer: Self-pay | Admitting: Gynecologic Oncology

## 2024-05-19 DIAGNOSIS — R590 Localized enlarged lymph nodes: Secondary | ICD-10-CM

## 2024-05-19 DIAGNOSIS — C549 Malignant neoplasm of corpus uteri, unspecified: Secondary | ICD-10-CM

## 2024-05-22 ENCOUNTER — Encounter: Payer: Self-pay | Admitting: Psychiatry

## 2024-05-22 ENCOUNTER — Encounter (HOSPITAL_COMMUNITY)
Admission: RE | Admit: 2024-05-22 | Discharge: 2024-05-22 | Disposition: A | Discharge status: Home / Self Care | Source: Ambulatory Visit | Attending: Psychiatry | Admitting: Psychiatry

## 2024-05-22 ENCOUNTER — Other Ambulatory Visit: Payer: Self-pay | Admitting: Gynecologic Oncology

## 2024-05-22 DIAGNOSIS — C549 Malignant neoplasm of corpus uteri, unspecified: Secondary | ICD-10-CM | POA: Insufficient documentation

## 2024-05-22 DIAGNOSIS — R600 Localized edema: Secondary | ICD-10-CM

## 2024-05-22 DIAGNOSIS — R590 Localized enlarged lymph nodes: Secondary | ICD-10-CM | POA: Insufficient documentation

## 2024-05-22 LAB — GLUCOSE, CAPILLARY: Glucose-Capillary: 266 mg/dL — ABNORMAL HIGH (ref 70–99)

## 2024-05-22 MED ORDER — FLUDEOXYGLUCOSE F - 18 (FDG) INJECTION: 6.9000 | Freq: Once | INTRAVENOUS | Status: DC | PRN

## 2024-05-23 ENCOUNTER — Encounter (HOSPITAL_COMMUNITY)
Admission: RE | Admit: 2024-05-23 | Discharge: 2024-05-23 | Disposition: A | Discharge status: Home / Self Care | Source: Ambulatory Visit | Attending: Gynecologic Oncology | Admitting: Gynecologic Oncology

## 2024-05-23 ENCOUNTER — Telehealth: Payer: Self-pay

## 2024-05-23 DIAGNOSIS — R590 Localized enlarged lymph nodes: Secondary | ICD-10-CM | POA: Insufficient documentation

## 2024-05-23 DIAGNOSIS — C549 Malignant neoplasm of corpus uteri, unspecified: Secondary | ICD-10-CM | POA: Insufficient documentation

## 2024-05-23 DIAGNOSIS — C55 Malignant neoplasm of uterus, part unspecified: Secondary | ICD-10-CM | POA: Insufficient documentation

## 2024-05-23 LAB — GLUCOSE, CAPILLARY: Glucose-Capillary: 81 mg/dL (ref 70–99)

## 2024-05-23 MED ORDER — FLUDEOXYGLUCOSE F - 18 (FDG) INJECTION
6.4000 | Freq: Once | INTRAVENOUS | Status: AC
  Administered 2024-05-23: 6.4 via INTRAVENOUS

## 2024-05-23 NOTE — Telephone Encounter (Signed)
 [name] is scheduled for physical therapy on 06/06/24 with [facility] (referral placed by Dr.Newton)

## 2024-05-29 ENCOUNTER — Encounter

## 2024-05-30 ENCOUNTER — Encounter: Payer: Self-pay | Admitting: Internal Medicine

## 2024-05-30 ENCOUNTER — Ambulatory Visit: Admitting: Internal Medicine

## 2024-05-30 ENCOUNTER — Telehealth: Payer: Self-pay

## 2024-05-30 ENCOUNTER — Encounter: Payer: Self-pay | Admitting: Hematology and Oncology

## 2024-05-30 ENCOUNTER — Encounter: Payer: Self-pay | Admitting: Psychiatry

## 2024-05-30 VITALS — BP 118/70 | HR 81 | Ht 64.0 in | Wt 137.0 lb

## 2024-05-30 DIAGNOSIS — E1059 Type 1 diabetes mellitus with other circulatory complications: Secondary | ICD-10-CM

## 2024-05-30 DIAGNOSIS — E042 Nontoxic multinodular goiter: Secondary | ICD-10-CM

## 2024-05-30 DIAGNOSIS — E063 Autoimmune thyroiditis: Secondary | ICD-10-CM | POA: Diagnosis not present

## 2024-05-30 LAB — POCT GLYCOSYLATED HEMOGLOBIN (HGB A1C): Hemoglobin A1C: 8 % — AB (ref 4.0–5.6)

## 2024-05-30 NOTE — Progress Notes (Signed)
 Patient ID: Lisa  CHANTALE Zavala, female   DOB: 1950-10-02, 73 y.o.   MRN: [id number]  HPI  [name] is a 73 y.o.-year-old female, initially referred by Dr Rockney for management of hypothyroidism, diagnosed as secondary to Hashimoto's thyroiditis, and also thyroid  nodules, diagnosed 11/2021, but also type 1 diabetes, dx'ed 10/2023.  Last visit 3 months ago.  Interim history: She continues to play tennis, and walk for exercise.   Also, after her diagnosis of diabetes, she did a great job adjusting her diet, eating a low-carb diet and reducing highly processed foods.  She continues this diet. She had constipation and mouth sores, now resolved after starting MiraLAX . She has been found to have 2 enlarged LNs in the L inguinal area >> PET scan  is pending.  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 She started ChTx spring 2024.  She then started Keytruda , but this was stopped after she developed DKA 10/31/2023.  At that time, she had nausea/vomiting/diarrhea and she was diagnosed with colitis. She was started on basal-bolus insulin  regimen.  Keytruda  was stopped.  Reviewed HbA1c Levels: Lab Results  Component Value Date   HGBA1C 9.3 (A) 01/12/2024   HGBA1C 7.8 (H) 10/31/2023   HGBA1C 7.5 (H) 10/31/2023   HGBA1C 5.5 10/21/2011   Previously on: - Lantus  12 units daily in am - Novolog  5 units before each meal + sliding scale >> 15 minutes before each meal: 3 units before a smaller meal 4 units before a regular meal 5 units before a large meal - NovoLog  sliding scale: 150-200: + 1 unit 201-250: + 2 units 251-300: + 3 units >301-350: + 4 units  We changed to: - Lantus  9 units daily and 2 units at night (gradually reduced from 8 units) >> now 2-10 units - Novolog  - 15 min before meal: 4 units at waking up 2-4 units before lunch and dinner - NovoLog  sliding  scale: 150-200: + 1 unit 201-250: + 2 units 251-300: + 3 units >301-350: + 4 units  Meter: AccuChek  Pt checks her sugars more than 4 times a day with her CGM:  Previously:  Prev.:  Lowest sugar was 84 >> 55 >> 49; she has hypoglycemia awareness at 70 Highest sugar was 320 >> HI >> HI. No previous DKA admissions.    Pt's meals are: - Breakfast: Cookie or cracker - Lunch: Leftovers or soup or salad or veggies - Dinner: Meat and veggies - Snacks: sweets >> stopped  - no CKD, last BUN/creatinine:  Lab Results  Component Value Date   BUN 20 01/10/2024   BUN 16 11/19/2023   CREATININE 0.90 05/09/2024   CREATININE 0.82 01/10/2024   No results found for: MICRALBCREAT  - No HL: Last set of lipids:  Lab Results  Component Value Date   CHOL 161 12/14/2016   HDL 71 12/14/2016   LDLCALC 77 12/14/2016   TRIG 65 12/14/2016   CHOLHDL 2.3 12/14/2016  She is not on a statin.  - last eye exam was in 08/2023. No DR reportedly.   - no numbness and tingling in her feet.  Last foot exam 01/12/2024.  She has family history of type 1 diabetes in her sister.  Hypothyroidism: Reviewed history: Patient was diagnosed with hypothyroidism in 10/2014 and started on levothyroxine  50 mcg daily.   We increased the dose to 75 mcg daily in 11/2021. At our visit from  10/2023, she was taking 100 mcg daily. In 12/2023, Dr. Lonn increased her LT4 to 125 mcg daily.  She takes her levothyroxine : - in am (~4 am) - fasting - at least 1h from + b'fast and coffee + creamer - Ca, vit D >4h after LT4 - no Fe, + MVI at dinnertime - no PPIs - stopped Biotin   Reviewed her TFTs: Lab Results  Component Value Date   TSH 0.94 02/25/2024   TSH 8.700 (H) 01/10/2024   TSH 2.140 11/19/2023   TSH 0.780 10/31/2023   TSH 0.803 10/19/2023   TSH 0.812 08/27/2023   TSH 0.951 07/13/2023   TSH 5.086 (H) 06/01/2023   TSH 7.198 (H) 05/06/2023   TSH 0.044 (L) 01/15/2023   FREET4 1.3 02/25/2024    FREET4 0.81 01/19/2022   FREET4 0.78 12/01/2021   FREET4 0.80 12/02/2020   FREET4 1.02 01/08/2020   FREET4 0.88 11/28/2018   FREET4 0.90 12/13/2017   FREET4 0.87 12/04/2016   FREET4 0.95 08/28/2016   FREET4 1.01 10/18/2015    Patient's TPO antibodies were elevated, confirming Hashimoto's thyroiditis Component     Latest Ref Rng 04/15/2015  Thyroperoxidase Ab SerPl-aCnc     <9 IU/mL 151 (H)  Thyroglobulin Ab     <2 IU/mL <1   Thyroid  nodule: - Initially detected on palpation - Reviewed previous investigation:  Thyroid  U/S (12/17/2021): Parenchymal Echotexture: Moderately heterogenous  Isthmus: 3 mm  Right lobe: 4.5 x 2.1 x 1.4 cm  Left lobe: 3.9 x 1.2 x 1.2 cm _________________________________________________________   Nodule # 1:  Location: Isthmus; right  Maximum size: 1.3 cm; Other 2 dimensions: 1.1 x 0.6 cm  Composition: solid/almost completely solid (2)  Echogenicity: hypoechoic (2)  ACR TI-RADS total points: 4.  *Given size (>/= 1 - 1.4 cm) and appearance, a follow-up ultrasound in 1 year should be considered based on TI-RADS criteria.  _________________________________________________________   Additional subcentimeter hypoechoic nodule in the left thyroid  superiorly measures only 6 mm. This would not meet criteria for any biopsy or follow-up and is not fully detailed by TI rads criteria.   Moderate thyroid  heterogeneity suggesting component of medical thyroid  disease. Correlate with thyroid  function tests. No hypervascularity. No regional adenopathy.   IMPRESSION: 1.3 cm right isthmus TR 4 nodule meets criteria for follow-up in 1 year.   Other findings as above.  Thyroid  U/S (01/14/2024): Parenchymal Echotexture: Markedly heterogenous  Isthmus: 0.3 cm  Right lobe: 3.3 x 0.8 x 0.9 cm  Left lobe: 2.3 x 0.6 x 0.8 cm  _________________________________________________________   Estimated total number of nodules >/= 1 cm:  0 _________________________________________________________   Small and diffusely heterogeneous thyroid  gland. No discrete thyroid  nodules are identified. The previously noted isthmic nodule is no longer visible and appears to have resolved.   IMPRESSION: 1. Interval resolution of previously identified nodule in the thyroid  isthmus. No thyroid  nodules are present on today's exam. 2. Small/atrophied and diffusely heterogeneous thyroid  gland.   Pt denies: - feeling nodules in neck - hoarseness - dysphagia - choking  No FH of thyroid  cancer. No h/o radiation tx to head or neck. No herbal supplements. No Biotin use. No recent steroids use.   She has knee pain.  In 04/2021, she fell and hurt her elbow while playing tennis.  She had a fracture and joint effusion.  This resolved. She was seeing Dr Dolphus (rheum).  She was investigated for possible autoimmune disease in the past, but she does not have a clear diagnosis. She was  playing tennis consistently and playing in tournaments, but now on hold. She had THR in 11/2015.  ROS: + see HPI  I reviewed pt's medications, allergies, PMH, social hx, family hx, and changes were documented in the history of present illness. Otherwise, unchanged from my initial visit note.   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   Top of Left Hand(Tx p Bx)   Past Surgical History:  Procedure Laterality Date   CARPAL TUNNEL RELEASE     BILAT      CATARACT EXTRACTION     BILAT    ECTOPIC PREGNANCY SURGERY     in the setting of prior BTL, lapartomy   EYE SURGERY     IR IMAGING GUIDED PORT INSERTION  12/02/2022   KNEE CARTILAGE SURGERY     RIGHT    (MENISCUS)   LAPAROTOMY N/A 02/16/2023   Procedure: MINI LAPAROTOMY;  Surgeon: Eldonna Mays, MD;  Location: [facility];  Service: Gynecology;  Laterality: N/A;   LYMPH NODE DISSECTION Right 02/16/2023   Procedure: RIGHT PELVIC LYMPH NODE DISSECTION;  Surgeon: Eldonna Mays, MD;  Location: [facility];  Service: Gynecology;  Laterality: Right;   MOHS SURGERY     left nose/inner eye for basal cell   TONSILLECTOMY     TOTAL HIP ARTHROPLASTY Left 12/19/2015   TOTAL HIP ARTHROPLASTY Left 12/19/2015   Procedure: LEFT TOTAL HIP ARTHROPLASTY;  Surgeon: Maude LELON Right, MD;  Location: [facility];  Service: Orthopedics;  Laterality: Left;   TRIGGER FINGER RELEASE     TUBAL LIGATION     Social History   Social History   Marital Status: Married    Spouse Name: N/A   Number of Children: 2   Occupational History    retired programmer, systems, now interior and spatial designer of preschool at [employer]   Social History Main Topics   Smoking status: Never Smoker    Smokeless tobacco: Not on file   Alcohol  Use: 0.0 oz/week    0 Standard drinks or equivalent per week     Comment: Rare   Drug Use: No   Current Outpatient Medications on File Prior to Visit  Medication Sig Dispense Refill   acetaminophen  (TYLENOL ) 500 MG tablet Take 500-1,000 mg by mouth every 6 (six) hours as needed (for muscle pain or soreness).     aspirin  EC 81 MG tablet Take 81 mg by mouth at bedtime. Swallow whole.     Blood Glucose Monitoring Suppl (BLOOD GLUCOSE MONITOR SYSTEM) w/Device KIT Use to check blood sugar three times daily 1 kit 0   calcium carbonate (TUMS) 500 MG chewable tablet Chew 1 tablet by mouth daily.     Carboxymethylcellulose Sodium (THERATEARS OP) Place 1 drop into both eyes 3 (three) times daily.     Cholecalciferol (VITAMIN D ) 50 MCG (2000  UT) tablet Take 2,000 Units by mouth daily.  Continuous Glucose Sensor (FREESTYLE LIBRE 3 PLUS SENSOR) MISC Inject 1 Device into the skin continuous. Change every 15 days 6 each 3   diclofenac  sodium (VOLTAREN ) 1 % GEL Apply 2-4 g topically 4 (four) times daily. 3 Tube 3   DULoxetine  (CYMBALTA ) 30 MG capsule Take 1 capsule (30 mg total) by mouth daily. 30 capsule 3   estradiol  (VIVELLE -DOT) 0.05 MG/24HR patch Place 1 patch (0.05 mg total) onto the skin 2 (two) times a week. (Patient not taking: Reported on 04/19/2024) 8 patch 12   gabapentin  (NEURONTIN ) 100 MG capsule Take 100-200 mg by mouth daily as needed (for hot flashes).     Glucose Blood (BLOOD GLUCOSE TEST STRIPS) STRP Use to check blood sugar three times daily 100 strip 0   insulin  aspart (NOVOLOG ) 100 UNIT/ML FlexPen Inject 4-8 Units into the skin 3 (three) times daily with meals. 30 mL 3   insulin  glargine (LANTUS ) 100 UNIT/ML Solostar Pen Inject 20 Units into the skin daily. 15 mL 3   Insulin  Pen Needle (PEN NEEDLES) 31G X 5 MM MISC Use to inject insulin  as directed 300 each 5   Lancet Device MISC 1 each by Does not apply route 3 (three) times daily. May dispense any manufacturer covered by patient's insurance. 1 each 0   Lancets MISC Use to check blood sugar three times daily 100 each 0   levothyroxine  (SYNTHROID ) 125 MCG tablet Take 1 tablet (125 mcg total) by mouth daily before breakfast. 90 tablet 3   lidocaine -prilocaine  (EMLA ) cream Apply 1 Application topically as needed. 30 g 3   magic mouthwash (nystatin , lidocaine , diphenhydrAMINE , alum & mag hydroxide) suspension Swish and spit 5 mLs by mouth 4 (four) times daily. (Patient not taking: Reported on 04/19/2024) 240 mL 0   melatonin 5 MG TABS Take 5 mg by mouth at bedtime as needed (sleep).     meloxicam  (MOBIC ) 7.5 MG tablet Take 1 tablet (7.5 mg total) by mouth daily. (Patient taking differently: Take 7.5 mg by mouth daily. Only as needed) 30 tablet 1   Multiple Vitamin  (MULTIVITAMIN) tablet Take 1 tablet by mouth daily with breakfast.     rosuvastatin (CRESTOR) 5 MG tablet Take 5 mg by mouth.     No current facility-administered medications on file prior to visit.   Allergies  Allergen Reactions   Sulfa Antibiotics Swelling and Other (See Comments)    Caused tics and the tongue became swollen for approx a week   Family History  Problem Relation Age of Onset   Uterine cancer Mother 23   Heart attack Mother    Hypertension Mother    Kidney cancer Father 3   Lung cancer Father    Diabetes Sister    Thyroid  disease Neg Hx    Colon cancer Neg Hx    Breast cancer Neg Hx    Ovarian cancer Neg Hx    Endometrial cancer Neg Hx    Pancreatic cancer Neg Hx    Prostate cancer Neg Hx    PE: BP 118/70   Pulse 81   Ht 5' 4 (1.626 m)   Wt 137 lb (62.1 kg)   SpO2 98%   BMI 23.52 kg/m    Wt Readings from Last 3 Encounters:  05/30/24 137 lb (62.1 kg)  04/24/24 138 lb 9.6 oz (62.9 kg)  02/25/24 141 lb (64 kg)   Constitutional: normal weight, in NAD Eyes: EOMI, no exophthalmos ENT: No thyromegaly, no cervical lymphadenopathy Cardiovascular: RRR, No MRG Respiratory: CTA  B Musculoskeletal: no deformities Skin: no rashes Neurological: no tremor with outstretched hands  ASSESSMENT: Insulin -dependent diabetes with complications: - New diagnosis, after she presented in DKA - Aortic atherosclerosis on CT scan (10/31/2023)  Component     Latest Ref Rng 10/31/2023  Glucose-Capillary     70 - 99 mg/dL 850 (H)   C-Peptide     1.1 - 4.4 ng/mL 0.2 (L)     Component     Latest Ref Rng 10/31/2023  Glutamic Acid Decarb Ab     0.0 - 5.0 U/mL <5.0   IA-2 Autoantibodies     U/mL <7.5   ZNT8 Antibodies     U/mL <15    2. Hypothyroidism due to Hashimoto's thyroiditis  3.  History of right thyroid  nodule  PLAN:  Insulin -dependent diabetes complicated by DKA - Likely developed in the setting of anti-PD-1 inhibitor for metastatic primary gynecological  cancer treatment.  Keytruda  was held afterwards.  We discussed that unfortunately this type of diabetes is usually not reversible.  She had a DKA episode and a low C-peptide, at 0.2, confirming type 1 diabetes.  Her antipancreatic antibodies were not elevated. - I previously suggested a reference book for further information about type 1 diabetes: Think Like a Pancreas by Arley Alas. -At last visit, sugars are much improved particularly overnight, but they were increasing after every meal and dropping more abruptly between 8 and 9 PM.  She reported that adding a dose of Lantus  at bedtime was a game changer but she had to decrease the dose she also had to decrease the dose of NovoLog  to avoid lows blood sugars were still elevated in the middle of the day and dropping more abruptly in the evening.  Since sugars were increasing after getting out of bed even before drinking coffee and eating, I advised her to hold NovoLog  with waking up, approximately 15 minutes before starting to drink her coffee.  We discussed about taking 4 units in the morning and 2 to 4 units before the rest of the meals.  She is preparing for a steroid injection for frozen shoulder and we discussed about increasing NovoLog  at that time. CGM interpretation:% -At today's visit, we reviewed her CGM downloads: It appears that 58% of values are in target range (goal >70%), while 42% are higher than 180 (goal <25%), and 0% are lower than 70 (goal <4%).  The calculated average blood sugar is 181.  The projected HbA1c for the next 3 months (GMI) is 7.6%. -Reviewing the CGM trends, sugars appear to be slightly more fluctuating, with many values higher than target after meals.  She is adjusting the dose of NovoLog  before meals but we discussed that for meals with more carbs she can increase the dose from 4 units to 6 units.  She is occasionally doing this now.  I believe the source of variability is the fact that she varies the dose of Lantus   between 2 to 10 units at night.  We discussed about increasing the dose to 4 units and staying with the same dose for at least several days to see how this works before changing the dose. - I advised her to: Patient Instructions  Please use the following regimen: - Lantus  9 units daily and 4 units at night - Novolog  - 15 min before meal: 4-6 units before meals  Add: - NovoLog  sliding scale: 150-200: + 1 unit 201-250: + 2 units 251-300: + 3 units >301: + 4 units  Please continue Levothyroxine   125 mcg daily.  Take the thyroid  hormone every day, with water , at least 30 minutes before breakfast, separated by at least 4 hours from: - acid reflux medications - calcium - iron - multivitamins  Please return in 3 months.  - we checked her HbA1c: 8% (lower) - advised to check sugars at different times of the day - 4x a day, rotating check times - advised for yearly eye exams >> she is UTD - return to clinic in 3 months  2. Patient with history of autoimmune hypothyroidism, on levothyroxine  therapy - latest thyroid  labs reviewed with pt. >> normal: Lab Results  Component Value Date   TSH 0.94 02/25/2024  - she continues on LT4 125 mcg daily - pt feels good on this dose. - we discussed about taking the thyroid  hormone every day, with water , >30 minutes before breakfast, separated by >4 hours from acid reflux medications, calcium, iron, multivitamins. Pt. is taking it correctly.  3.  History of right thyroid  nodule - Detected on palpation - No neck compression symptoms - A thyroid  ultrasound showed a 1.3 right, solid, hypoechoic, isthmic nodule which qualified for 1 year follow-up.  She also had a left 6 mm nodule which did not require follow-up. - A PET scan showed that the whole thyroid  is an increased uptake, consistent with her diagnosis of Hashimoto's thyroiditis, but the thyroid  nodules were not necessarily FDG avid - We repeated a thyroid  ultrasound 01/14/2024 which showed  resolution of the isthmic nodule - No further imaging needed for now, will continue to follow her clinically  Lela Fendt, MD PhD [facility]

## 2024-05-30 NOTE — Telephone Encounter (Signed)
 Called to remind her to schedule CT scan on 2/2. She recently had a PET scan ordered by GYN and is waiting on results Told her to hold off on scheduling and office will call her back.

## 2024-05-30 NOTE — Patient Instructions (Addendum)
 Please use the following regimen: - Lantus  9 units daily and 4 units at night - Novolog  - 15 min before meal: 4-6 units before meals  Add: - NovoLog  sliding scale: 150-200: + 1 unit 201-250: + 2 units 251-300: + 3 units >301: + 4 units  Please continue Levothyroxine  125 mcg daily.  Take the thyroid  hormone every day, with water , at least 30 minutes before breakfast, separated by at least 4 hours from: - acid reflux medications - calcium - iron - multivitamins  Please return in 3 months.

## 2024-05-31 ENCOUNTER — Telehealth: Payer: Self-pay | Admitting: Psychiatry

## 2024-05-31 NOTE — Telephone Encounter (Signed)
 Called pt with PET scan results. Will have case reviewed at tumor board next week. Appears to have localized disease. Given not entirely clear if this arose from uterus versus fallopian tube/ovary given all treated at time of interval debulking surgery, may be reasonable to consider repeat debulking. Also reviewed the possibility of sending tumor origin testing, HER2 testing to help guide future treatment decisions. Will follow-up with patient after tumor board discussion to help finalize treatment plan.

## 2024-06-05 ENCOUNTER — Other Ambulatory Visit: Payer: Self-pay | Admitting: Oncology

## 2024-06-05 NOTE — Progress Notes (Signed)
 Gynecologic Oncology Multi-Disciplinary Disposition Conference Note  Date of the Conference: 06/05/2024  Patient Name: [name]  D Goldwire  Referring Provider: Dr. Lavoie Primary GYN Oncologist: Dr. 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 Disposition is to surgical resection followed by tumor origin testing and systemic therapy. Also consideration for radiation therapy if not able to have surgery.   This Multidisciplinary conference took place involving physicians from Gynecologic Oncology, Medical Oncology, Radiation Oncology, Pathology, Radiology along with the Gynecologic Oncology Nurse Practitioner and Gynecologic Oncology Nurse Navigator.  Comprehensive assessment of the patient's malignancy, staging, need for surgery, chemotherapy, radiation therapy, and need for further testing were reviewed. Supportive measures, both inpatient and following discharge were also discussed. The recommended plan of care is documented. Greater than 35 minutes were spent correlating and coordinating this patient's care.

## 2024-06-05 NOTE — Therapy (Signed)
 OUTPATIENT PHYSICAL THERAPY  LOWER EXTREMITY ONCOLOGY EVALUATION  Patient Name: Lisa  ALUNA Zavala MRN: [id number] DOB:1950/09/13, 73 y.o., female Today's Date: 06/06/2024  END OF SESSION:  PT End of Session - 06/06/24 1549     Visit Number 1    Number of Visits 17    Date for Recertification  08/01/24    PT Start Time 1501    PT Stop Time 1555    PT Time Calculation (min) 54 min    Activity Tolerance Patient tolerated treatment well    Behavior During Therapy WFL for tasks assessed/performed          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   Top of Left Hand(Tx p Bx)   Past Surgical History:  Procedure Laterality Date   CARPAL TUNNEL RELEASE     BILAT     CATARACT EXTRACTION     BILAT    ECTOPIC PREGNANCY SURGERY     in the setting of prior BTL, lapartomy   EYE SURGERY     IR IMAGING GUIDED PORT INSERTION  12/02/2022   KNEE CARTILAGE SURGERY     RIGHT    (MENISCUS)   LAPAROTOMY N/A 02/16/2023   Procedure: MINI LAPAROTOMY;  Surgeon: Eldonna Mays, MD;  Location: [facility];  Service: Gynecology;  Laterality: N/A;   LYMPH NODE DISSECTION Right 02/16/2023   Procedure: RIGHT PELVIC LYMPH NODE DISSECTION;  Surgeon: Eldonna Mays, MD;  Location: [facility];  Service: Gynecology;  Laterality: Right;   MOHS SURGERY     left nose/inner eye for basal cell   TONSILLECTOMY     TOTAL HIP ARTHROPLASTY Left 12/19/2015   TOTAL HIP ARTHROPLASTY Left 12/19/2015   Procedure: LEFT  TOTAL HIP ARTHROPLASTY;  Surgeon: Maude LELON Right, MD;  Location: [facility];  Service: Orthopedics;  Laterality: Left;   TRIGGER FINGER RELEASE     TUBAL LIGATION     Patient Active Problem List   Diagnosis Date Noted   Insulin  dependent type 1 diabetes mellitus (HCC) 02/08/2024   Menopausal symptoms 02/08/2024   DKA (diabetic ketoacidosis) (HCC) 10/31/2023   Colitis 10/31/2023   Anemia due to antineoplastic chemotherapy 08/27/2023   Rash and nonspecific skin eruption 08/27/2023   Abdominal pain 06/01/2023   Peripheral neuropathy due to chemotherapy 03/19/2023   Family history of uterine cancer    Family history of kidney cancer    Hot flashes 01/15/2023   Multiple thyroid  nodules 12/21/2022   Acute deep vein thrombosis (DVT) of right lower extremity (HCC) 11/20/2022   Mild anemia 11/20/2022   Uterine cancer (HCC) 11/19/2022   Unilateral primary osteoarthritis, left knee 08/05/2022   Unilateral primary osteoarthritis, right knee 08/05/2022   Bilateral primary osteoarthritis of knee 05/06/2022   Primary osteoarthritis of left hip 12/19/2015   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   Hypothyroidism due to Hashimoto's thyroiditis  10/15/2015   Degenerative disc disease    Arthritis     PCP: Ike Ditty, MD  REFERRING PROVIDER: Eleanor Epps, NP  REFERRING DIAG:  C54.9 (ICD-10-CM) - Malignant neoplasm of body of uterus, unspecified site (HCC) R60.0 (ICD-10-CM) - Lower extremity edema  THERAPY DIAG:  Lymphedema, not elsewhere classified - Plan: PT plan of care cert/re-cert  Muscle weakness (generalized) - Plan: PT plan of care cert/re-cert  Difficulty in walking, not elsewhere classified - Plan: PT plan of care cert/re-cert  Malignant neoplasm of body of uterus, unspecified site [facility]) - Plan: PT plan of care cert/re-cert  ONSET DATE: 02/16/23  Rationale for Evaluation and Treatment: Rehabilitation  SUBJECTIVE:                                                                                                                                                                                            SUBJECTIVE STATEMENT: I have never had any pain with this edema. I have started having pain since the hysterectomy when they cut a nerve. I have had severe pain on a regular basis. I have to take gabapentin . The pain is much less but my mobility has suffered. I think it is tight and weak because I have favored it. I can sit in my recliner at night with my feet up and a pillow behind my back but when I lie down on the bed I can not lay on my back. I can only sleep on my left side.   PERTINENT HISTORY: Underwent total hysterectomy and bilateral salpingo oophorectomy with omenectomy on 02/16/23, Uterine cancer 10/23/22 with pelvic lymphadenopathy did neoadj chemo, DVT R LE, needs R TKA, type 1 diabetes from Keytruda , has recurrence in L inguinal nodes and is planning on undergoing dissection of these nodes  PAIN:  Are you having pain? No  PRECAUTIONS: Other: L THA 2017, active cancer   WEIGHT BEARING RESTRICTIONS: No   FALLS:  Has patient fallen in last 6 months? no   LIVING ENVIRONMENT: Lives with: lives with their spouse, [name] Lives in: House/apartment Stairs: Yes; Internal: 14 steps; on right going up Has following equipment at home: Single point cane, Walker - 2 wheeled, Crutches, and bed side commode   OCCUPATION: part time, administrative in a school    LEISURE: walks daily for at least 30 min, was playing tennis 3x/wk   HAND DOMINANCE: right    PRIOR LEVEL OF FUNCTION: Independent   PATIENT GOALS: to get in and out of the car without assisting leg, get rid of the swelling, control the swelling, improve mobility     OBJECTIVE:   COGNITION: Overall cognitive  status: Within functional limits for tasks assessed      OBSERVATIONS / OTHER ASSESSMENTS: fullness visible throughout R LE especially at R thigh   POSTURE: forward head and rounded  shoulders     LYMPHEDEMA ASSESSMENTS:    LOWER EXTREMITY LANDMARK RIGHT 02/11/23 RIGHT  06/06/24  At groin     30 cm proximal to suprapatella   60.3  20 cm proximal to suprapatella 61.6 58.1  10 cm proximal to suprapatella 51 49  At midpatella / popliteal crease 40.9 39.3  30 cm proximal to floor at lateral plantar foot 37.6 38.2  20 cm proximal to floor at lateral plantar foot 32.3 32  10 cm proximal to floor at lateral plantar foot 21.8 22.4  Circumference of ankle/heel     5 cm proximal to 1st MTP joint 22.5 21.6  Across MTP joint 22.2 22.8  Around proximal great toe 7.5 7.8  (Blank rows = not tested)   LOWER EXTREMITY LANDMARK LEFT 02/11/23 LEFT 06/06/24  At groin     30 cm proximal to suprapatella   58  20 cm proximal to suprapatella 54.5 54  10 cm proximal to suprapatella 47 45.5  At midpatella / popliteal crease 39.9 36  30 cm proximal to floor at lateral plantar foot 37 36.9  20 cm proximal to floor at lateral plantar foot 29.4 28.5  10 cm proximal to floor at lateral plantar foot 21.4 21.5  Circumference of ankle/heel     5 cm proximal to 1st MTP joint 21.4 21.5  Across MTP joint 22 21.5  Around proximal great toe 7.4 7  (Blank rows = not tested)    LOWER EXTREMITY MMT:  MMT Right eval  Hip flexion 5/5  Hip extension   Hip abduction   Hip adduction 3/5  Hip internal rotation   Hip external rotation   Knee flexion 5/5  Knee extension 5/5  Ankle dorsiflexion 5/5  Ankle plantarflexion   Ankle inversion   Ankle eversion    (Blank rows = not tested)  MMT LEFT eval  Hip flexion 5/5  Hip extension   Hip abduction   Hip adduction 4/5  Hip internal rotation   Hip external rotation   Knee flexion 5/5  Knee extension 5/5  Ankle dorsiflexion 5/5  Ankle plantarflexion   Ankle inversion   Ankle eversion    (Blank rows = not tested)    LLIS:  Flowsheet Row Outpatient Rehab from 06/06/2024 in [facility]  Lymphedema Life  Impact Scale Total Score 50 %   Lower Extremity Functional Score: 64 / 80 = 80.0 %                                                                                                                            TREATMENT DATE:  06/06/24- educated pt about the obturator nerve and how it can lead to the weakness and pain she has been having and how lymphedema in  the RLE can further impact this   PATIENT EDUCATION:  Education details: obturator nerve and how damage can lead to weakness and pain, course of therapy with bandaging and eventually garments for lymphedema Person educated: Patient Education method: Explanation Education comprehension: verbalized understanding  HOME EXERCISE PROGRAM: None yet  ASSESSMENT:  CLINICAL IMPRESSION: Patient is a 73 y.o. female who was seen today for physical therapy evaluation and treatment for RLE lymphedema and weakness following treatment for  female cancer of unspecified site but suspected uterine cancer. She had lymphedema in her RLE prior to undergoing total hysterectomy and bilateral salpingo oophorectomy with omenectomy on 02/16/23. 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 She underwetn neoadjuvant chemo with complete response. She has developed recurrence in her L inguinal nodes and plans to have those resected soon. Due to involvmenet of the obturator nerve pt has had pain and weakness with R hip abduction. She has difficulty getting her leg in and out of the bed and in and out of the car. She has difficulty running when playing tennis. She would benefit from skilled PT services  OBJECTIVE IMPAIRMENTS: decreased knowledge of condition, decreased knowledge of use of DME, decreased mobility, difficulty walking, decreased ROM, decreased strength, increased edema, impaired flexibility, and pain.   ACTIVITY LIMITATIONS: squatting, stairs, bed mobility, and locomotion level  PARTICIPATION LIMITATIONS: community  activity  PERSONAL FACTORS: Time since onset of injury/illness/exacerbation and 1 comorbidity: diabetes are also affecting patient's functional outcome.   REHAB POTENTIAL: Good  CLINICAL DECISION MAKING: Evolving/moderate complexity  EVALUATION COMPLEXITY: Moderate   GOALS: Goals reviewed with patient? Yes  SHORT TERM GOALS: Target date: 07/04/24  Pt will be independent in compression bandaging for long term management of lymphedema.  Baseline: Goal status: INITIAL  2.  Pt will demonstrate a 1 cm decrease in edema 20 cm superior to suprapatella to decrease risk of infection.  Baseline:  Goal status: INITIAL  3.  Pt will be independent in initial HEP for LE strengthening/stretching.  Baseline:  Goal status: INITIAL   LONG TERM GOALS: Target date: 08/01/2024   Pt will demonstrate a 2 cm decrease in edema 20 cm superior to suprapatella to decrease risk of infection.  Baseline:  Goal status: INITIAL  2.  Pt will obtain appropriate compression garments for long term management of RLE lymphedema. Baseline:  Goal status: INITIAL  3.  Pt will report she is able to get in and out of the car without lifting her leg Baseline:  Goal status: INITIAL  4.  Pt will be able to lie comfortably in bed on her back without increased pain.  Baseline:  Goal status: INITIAL  5.  Pt will demonstrate 3+/5 strength in R hip adductors to allow improved mobility.  Baseline:  Goal status: INITIAL   PLAN:  PT FREQUENCY: 2x/week  PT DURATION: 8 weeks  PLANNED INTERVENTIONS: 97164- PT Re-evaluation, 97110-Therapeutic exercises, 97530- Therapeutic activity, 97112- Neuromuscular re-education, 97535- Self Care, 02859- Manual therapy, [phone]- Orthotic Initial, [phone]- Orthotic/Prosthetic subsequent, Patient/Family education, Balance training, Joint mobilization, Therapeutic exercises, Therapeutic activity, Neuromuscular re-education, Gait training, and Self Care  PLAN FOR NEXT SESSION: instruct in  bandaging technique for RLE, begin complete CDT for RLE, R hip adductor strengthening, R LE strengthening, eventually measure for RLE garment once maximally reduced   [employer], PT 06/06/2024, 5:23 PM

## 2024-06-06 ENCOUNTER — Other Ambulatory Visit: Payer: Self-pay

## 2024-06-06 ENCOUNTER — Encounter: Payer: Self-pay | Admitting: Psychiatry

## 2024-06-06 ENCOUNTER — Encounter: Payer: Self-pay | Admitting: Physical Therapy

## 2024-06-06 ENCOUNTER — Ambulatory Visit: Attending: Gynecologic Oncology | Admitting: Physical Therapy

## 2024-06-06 DIAGNOSIS — R262 Difficulty in walking, not elsewhere classified: Secondary | ICD-10-CM | POA: Diagnosis present

## 2024-06-06 DIAGNOSIS — C549 Malignant neoplasm of corpus uteri, unspecified: Secondary | ICD-10-CM | POA: Insufficient documentation

## 2024-06-06 DIAGNOSIS — I890 Lymphedema, not elsewhere classified: Secondary | ICD-10-CM | POA: Insufficient documentation

## 2024-06-06 DIAGNOSIS — R600 Localized edema: Secondary | ICD-10-CM | POA: Diagnosis not present

## 2024-06-06 DIAGNOSIS — M6281 Muscle weakness (generalized): Secondary | ICD-10-CM | POA: Diagnosis present

## 2024-06-07 ENCOUNTER — Encounter: Payer: Self-pay | Admitting: Physical Therapy

## 2024-06-07 ENCOUNTER — Ambulatory Visit: Admitting: Physical Therapy

## 2024-06-07 DIAGNOSIS — I890 Lymphedema, not elsewhere classified: Secondary | ICD-10-CM | POA: Diagnosis not present

## 2024-06-07 DIAGNOSIS — C549 Malignant neoplasm of corpus uteri, unspecified: Secondary | ICD-10-CM

## 2024-06-07 DIAGNOSIS — M6281 Muscle weakness (generalized): Secondary | ICD-10-CM

## 2024-06-07 DIAGNOSIS — R262 Difficulty in walking, not elsewhere classified: Secondary | ICD-10-CM

## 2024-06-07 NOTE — Therapy (Signed)
 OUTPATIENT PHYSICAL THERAPY  LOWER EXTREMITY ONCOLOGY TREATMENT  Patient Name: [name]  NAMINE BEAHM MRN: [id number] DOB:Jul 23, 1950, 73 y.o., female Today's Date: 06/07/2024  END OF SESSION:  PT End of Session - 06/07/24 0901     Visit Number 2    Number of Visits 17    Date for Recertification  08/01/24    PT Start Time 0802    PT Stop Time 0901    PT Time Calculation (min) 59 min    Activity Tolerance Patient tolerated treatment well    Behavior During Therapy WFL for tasks assessed/performed          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   Top of Left Hand(Tx p Bx)   Past Surgical History:  Procedure Laterality Date   CARPAL TUNNEL RELEASE     BILAT     CATARACT EXTRACTION     BILAT    ECTOPIC PREGNANCY SURGERY     in the setting of prior BTL, lapartomy   EYE SURGERY     IR IMAGING GUIDED PORT INSERTION  12/02/2022   KNEE CARTILAGE SURGERY     RIGHT    (MENISCUS)   LAPAROTOMY N/A 02/16/2023   Procedure: MINI LAPAROTOMY;  Surgeon: Eldonna Mays, MD;  Location: [facility];  Service: Gynecology;  Laterality: N/A;   LYMPH NODE DISSECTION Right 02/16/2023   Procedure: RIGHT PELVIC LYMPH NODE DISSECTION;  Surgeon: Eldonna Mays, MD;  Location: [facility];  Service: Gynecology;  Laterality: Right;   MOHS SURGERY     left nose/inner eye for basal cell   TONSILLECTOMY     TOTAL HIP ARTHROPLASTY Left 12/19/2015   TOTAL HIP ARTHROPLASTY Left 12/19/2015   Procedure: LEFT  TOTAL HIP ARTHROPLASTY;  Surgeon: Maude LELON Right, MD;  Location: [facility];  Service: Orthopedics;  Laterality: Left;   TRIGGER FINGER RELEASE     TUBAL LIGATION     Patient Active Problem List   Diagnosis Date Noted   Insulin  dependent type 1 diabetes mellitus (HCC) 02/08/2024   Menopausal symptoms 02/08/2024   DKA (diabetic ketoacidosis) (HCC) 10/31/2023   Colitis 10/31/2023   Anemia due to antineoplastic chemotherapy 08/27/2023   Rash and nonspecific skin eruption 08/27/2023   Abdominal pain 06/01/2023   Peripheral neuropathy due to chemotherapy 03/19/2023   Family history of uterine cancer    Family history of kidney cancer    Hot flashes 01/15/2023   Multiple thyroid  nodules 12/21/2022   Acute deep vein thrombosis (DVT) of right lower extremity (HCC) 11/20/2022   Mild anemia 11/20/2022   Uterine cancer (HCC) 11/19/2022   Unilateral primary osteoarthritis, left knee 08/05/2022   Unilateral primary osteoarthritis, right knee 08/05/2022   Bilateral primary osteoarthritis of knee 05/06/2022   Primary osteoarthritis of left hip 12/19/2015   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   Hypothyroidism due to Hashimoto's thyroiditis  10/15/2015   Degenerative disc disease    Arthritis     PCP: Ike Ditty, MD  REFERRING PROVIDER: Eleanor Epps, NP  REFERRING DIAG:  C54.9 (ICD-10-CM) - Malignant neoplasm of body of uterus, unspecified site (HCC) R60.0 (ICD-10-CM) - Lower extremity edema  THERAPY DIAG:  Lymphedema, not elsewhere classified  Muscle weakness (generalized)  Difficulty in walking, not elsewhere classified  Malignant neoplasm of body of uterus, unspecified site [facility])  ONSET DATE: 02/16/23  Rationale for Evaluation and Treatment: Rehabilitation  SUBJECTIVE:                                                                                                                                                                                            SUBJECTIVE STATEMENT: My leg gets these hives sometimes when it gets scratched.   PERTINENT HISTORY: Underwent total hysterectomy and bilateral salpingo oophorectomy with omenectomy on 02/16/23, Uterine cancer 10/23/22 with pelvic lymphadenopathy did neoadj chemo, DVT R LE, needs R TKA, type 1 diabetes from Keytruda , has recurrence in L inguinal nodes and is planning on undergoing dissection of these nodes  PAIN:  Are you having pain? No  PRECAUTIONS: Other: L THA 2017, active cancer   WEIGHT BEARING RESTRICTIONS: No   FALLS:  Has patient fallen in last 6 months? no   LIVING ENVIRONMENT: Lives with: lives with their spouse, [name] Lives in: House/apartment Stairs: Yes; Internal: 14 steps; on right going up Has following equipment at home: Single point cane, Walker - 2 wheeled, Crutches, and bed side commode   OCCUPATION: part time, administrative in a school    LEISURE: walks daily for at least 30 min, was playing tennis 3x/wk   HAND DOMINANCE: right    PRIOR LEVEL OF FUNCTION: Independent   PATIENT GOALS: to get in and out of the car without assisting leg, get rid of the swelling, control the swelling, improve mobility     OBJECTIVE:   COGNITION: Overall cognitive status: Within functional limits for tasks assessed      OBSERVATIONS / OTHER ASSESSMENTS: fullness visible throughout R LE especially at R thigh   POSTURE: forward head and rounded shoulders     LYMPHEDEMA ASSESSMENTS:    LOWER EXTREMITY LANDMARK RIGHT 02/11/23 RIGHT  06/06/24  At groin     30 cm proximal to suprapatella   60.3  20 cm proximal to suprapatella 61.6 58.1  10 cm proximal to suprapatella 51 49  At midpatella / popliteal crease 40.9 39.3  30 cm proximal to floor at lateral plantar foot 37.6 38.2  20 cm proximal to floor at lateral plantar foot 32.3 32  10 cm proximal  to floor at lateral plantar foot 21.8 22.4  Circumference of ankle/heel     5 cm proximal to 1st MTP joint 22.5 21.6   Across MTP joint 22.2 22.8  Around proximal great toe 7.5 7.8  (Blank rows = not tested)   LOWER EXTREMITY LANDMARK LEFT 02/11/23 LEFT 06/06/24  At groin     30 cm proximal to suprapatella   58  20 cm proximal to suprapatella 54.5 54  10 cm proximal to suprapatella 47 45.5  At midpatella / popliteal crease 39.9 36  30 cm proximal to floor at lateral plantar foot 37 36.9  20 cm proximal to floor at lateral plantar foot 29.4 28.5  10 cm proximal to floor at lateral plantar foot 21.4 21.5  Circumference of ankle/heel     5 cm proximal to 1st MTP joint 21.4 21.5  Across MTP joint 22 21.5  Around proximal great toe 7.4 7  (Blank rows = not tested)    LOWER EXTREMITY MMT:  MMT Right eval  Hip flexion 5/5  Hip extension   Hip abduction   Hip adduction 3/5  Hip internal rotation   Hip external rotation   Knee flexion 5/5  Knee extension 5/5  Ankle dorsiflexion 5/5  Ankle plantarflexion   Ankle inversion   Ankle eversion    (Blank rows = not tested)  MMT LEFT eval  Hip flexion 5/5  Hip extension   Hip abduction   Hip adduction 4/5  Hip internal rotation   Hip external rotation   Knee flexion 5/5  Knee extension 5/5  Ankle dorsiflexion 5/5  Ankle plantarflexion   Ankle inversion   Ankle eversion    (Blank rows = not tested)    LLIS:  Flowsheet Row Outpatient Rehab from 06/06/2024 in [facility]  Lymphedema Life Impact Scale Total Score 50 %   Lower Extremity Functional Score: 64 / 80 = 80.0 %                                                                                                                            TREATMENT DATE:  06/07/24: Spent entire session demonstrating pt how to apply compression bandaging to RLE and having pt return demo as follows with pt seated edge of mat: TG soft small from foot to knee, TG soft medium from below knee to groin, Artiflex from foot to knee (extra at popliteal fold) and then from just below knee  to groin, 1 6 cm at foot in HAS pattern, 1 8 cm from ankle to knee, 1 10 cm from ankle to knee in figure 8, 1 12 cm from just below knee to mid thigh with x behind knee then 12 cm from just below knee to groin in spiral with TG soft folded over. Had pt return demo each step and wrote out instructions for pt. Educated pt when to remove- see education section  06/06/24- educated pt about the obturator nerve and  how it can lead to the weakness and pain she has been having and how lymphedema in the RLE can further impact this   PATIENT EDUCATION:  Education details:don't keep bandages on more than 2 days, re wrap when they get loose, remove if there is increased pain, swelling in toes, turns blue, numbness and tingling that does not go away when you move Person educated: Patient Education method: Explanation Education comprehension: verbalized understanding  HOME EXERCISE PROGRAM: None yet  ASSESSMENT:  CLINICAL IMPRESSION: Began instructing pt in compression bandaging technique today and had her return demo each step. Wrote down instructions so pt can practice re wrapping at home. Educated pt when to remove bandages. She was able to don her shoe at end of session and left session with bandages intact.   OBJECTIVE IMPAIRMENTS: decreased knowledge of condition, decreased knowledge of use of DME, decreased mobility, difficulty walking, decreased ROM, decreased strength, increased edema, impaired flexibility, and pain.   ACTIVITY LIMITATIONS: squatting, stairs, bed mobility, and locomotion level  PARTICIPATION LIMITATIONS: community activity  PERSONAL FACTORS: Time since onset of injury/illness/exacerbation and 1 comorbidity: diabetes are also affecting patient's functional outcome.   REHAB POTENTIAL: Good  CLINICAL DECISION MAKING: Evolving/moderate complexity  EVALUATION COMPLEXITY: Moderate   GOALS: Goals reviewed with patient? Yes  SHORT TERM GOALS: Target date: 07/04/24  Pt will be  independent in compression bandaging for long term management of lymphedema.  Baseline: Goal status: INITIAL  2.  Pt will demonstrate a 1 cm decrease in edema 20 cm superior to suprapatella to decrease risk of infection.  Baseline:  Goal status: INITIAL  3.  Pt will be independent in initial HEP for LE strengthening/stretching.  Baseline:  Goal status: INITIAL   LONG TERM GOALS: Target date: 08/01/2024   Pt will demonstrate a 2 cm decrease in edema 20 cm superior to suprapatella to decrease risk of infection.  Baseline:  Goal status: INITIAL  2.  Pt will obtain appropriate compression garments for long term management of RLE lymphedema. Baseline:  Goal status: INITIAL  3.  Pt will report she is able to get in and out of the car without lifting her leg Baseline:  Goal status: INITIAL  4.  Pt will be able to lie comfortably in bed on her back without increased pain.  Baseline:  Goal status: INITIAL  5.  Pt will demonstrate 3+/5 strength in R hip adductors to allow improved mobility.  Baseline:  Goal status: INITIAL   PLAN:  PT FREQUENCY: 2x/week  PT DURATION: 8 weeks  PLANNED INTERVENTIONS: 97164- PT Re-evaluation, 97110-Therapeutic exercises, 97530- Therapeutic activity, 97112- Neuromuscular re-education, 97535- Self Care, 02859- Manual therapy, [phone]- Orthotic Initial, [phone]- Orthotic/Prosthetic subsequent, Patient/Family education, Balance training, Joint mobilization, Therapeutic exercises, Therapeutic activity, Neuromuscular re-education, Gait training, and Self Care  PLAN FOR NEXT SESSION: instruct in bandaging technique for RLE, begin complete CDT for RLE, R hip adductor strengthening, R LE strengthening, eventually measure for RLE garment once maximally reduced   [employer], PT 06/07/2024, 10:10 AM

## 2024-06-09 ENCOUNTER — Telehealth: Payer: Self-pay | Admitting: Oncology

## 2024-06-09 ENCOUNTER — Encounter: Payer: Self-pay | Admitting: Psychiatry

## 2024-06-09 NOTE — Telephone Encounter (Signed)
 Called [name]  regarding her Mychart message asking about the Gyn Oncology Cancer Conference results from Monday. Advised her the recommendation was for surgical resection followed by tumor origin testing and systemic therapy. Let her know that we will call her with potential surgery dates.  She verbalized understanding and agreement of the plan.

## 2024-06-12 ENCOUNTER — Telehealth: Payer: Self-pay | Admitting: Psychiatry

## 2024-06-12 ENCOUNTER — Ambulatory Visit

## 2024-06-12 ENCOUNTER — Encounter: Payer: Self-pay | Admitting: Hematology and Oncology

## 2024-06-12 DIAGNOSIS — I890 Lymphedema, not elsewhere classified: Secondary | ICD-10-CM

## 2024-06-12 DIAGNOSIS — M6281 Muscle weakness (generalized): Secondary | ICD-10-CM

## 2024-06-12 DIAGNOSIS — R262 Difficulty in walking, not elsewhere classified: Secondary | ICD-10-CM

## 2024-06-12 DIAGNOSIS — C549 Malignant neoplasm of corpus uteri, unspecified: Secondary | ICD-10-CM

## 2024-06-12 NOTE — Therapy (Signed)
 OUTPATIENT PHYSICAL THERAPY  LOWER EXTREMITY ONCOLOGY TREATMENT  Patient Name: Lisa  YICEL Zavala MRN: [id number] DOB:1951-06-12, 73 y.o., female Today's Date: 06/12/2024  END OF SESSION:  PT End of Session - 06/12/24 1512     Visit Number 3    Number of Visits 17    Date for Recertification  08/01/24    PT Start Time 1458    PT Stop Time 1600    PT Time Calculation (min) 62 min    Activity Tolerance Patient tolerated treatment well    Behavior During Therapy WFL for tasks assessed/performed          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   Top of Left Hand(Tx p Bx)   Past Surgical History:  Procedure Laterality Date   CARPAL TUNNEL RELEASE     BILAT     CATARACT EXTRACTION     BILAT    ECTOPIC PREGNANCY SURGERY     in the setting of prior BTL, lapartomy   EYE SURGERY     IR IMAGING GUIDED PORT INSERTION  12/02/2022   KNEE CARTILAGE SURGERY     RIGHT    (MENISCUS)   LAPAROTOMY N/A 02/16/2023   Procedure: MINI LAPAROTOMY;  Surgeon: Eldonna Mays, MD;  Location: [facility];  Service: Gynecology;  Laterality: N/A;   LYMPH NODE DISSECTION Right 02/16/2023   Procedure: RIGHT PELVIC LYMPH NODE DISSECTION;  Surgeon: Eldonna Mays, MD;  Location: [facility];  Service: Gynecology;  Laterality: Right;   MOHS SURGERY     left nose/inner eye for basal cell   TONSILLECTOMY     TOTAL HIP ARTHROPLASTY Left 12/19/2015   TOTAL HIP ARTHROPLASTY Left 12/19/2015   Procedure: LEFT  TOTAL HIP ARTHROPLASTY;  Surgeon: Maude LELON Right, MD;  Location: [facility];  Service: Orthopedics;  Laterality: Left;   TRIGGER FINGER RELEASE     TUBAL LIGATION     Patient Active Problem List   Diagnosis Date Noted   Insulin  dependent type 1 diabetes mellitus (HCC) 02/08/2024   Menopausal symptoms 02/08/2024   DKA (diabetic ketoacidosis) (HCC) 10/31/2023   Colitis 10/31/2023   Anemia due to antineoplastic chemotherapy 08/27/2023   Rash and nonspecific skin eruption 08/27/2023   Abdominal pain 06/01/2023   Peripheral neuropathy due to chemotherapy 03/19/2023   Family history of uterine cancer    Family history of kidney cancer    Hot flashes 01/15/2023   Multiple thyroid  nodules 12/21/2022   Acute deep vein thrombosis (DVT) of right lower extremity (HCC) 11/20/2022   Mild anemia 11/20/2022   Uterine cancer (HCC) 11/19/2022   Unilateral primary osteoarthritis, left knee 08/05/2022   Unilateral primary osteoarthritis, right knee 08/05/2022   Bilateral primary osteoarthritis of knee 05/06/2022   Primary osteoarthritis of left hip 12/19/2015   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   Hypothyroidism due to Hashimoto's thyroiditis  10/15/2015   Degenerative disc disease    Arthritis     PCP: Ike Ditty, MD  REFERRING PROVIDER: Eleanor Epps, NP  REFERRING DIAG:  C54.9 (ICD-10-CM) - Malignant neoplasm of body of uterus, unspecified site (HCC) R60.0 (ICD-10-CM) - Lower extremity edema  THERAPY DIAG:  Lymphedema, not elsewhere classified  Muscle weakness (generalized)  Difficulty in walking, not elsewhere classified  Malignant neoplasm of body of uterus, unspecified site [facility])  ONSET DATE: 02/16/23  Rationale for Evaluation and Treatment: Rehabilitation  SUBJECTIVE:                                                                                                                                                                                            SUBJECTIVE STATEMENT: I have rewrapped my leg since I was here last and I think it went okay. My husband ad I think we can see improvement in my leg already too. I did take it off when I played tennis because I didn't feel safe wearing it while I played.   PERTINENT HISTORY: Underwent total hysterectomy and bilateral salpingo oophorectomy with omenectomy on 02/16/23, Uterine cancer 10/23/22 with pelvic lymphadenopathy did neoadj chemo, DVT R LE, needs R TKA, type 1 diabetes from Keytruda , has recurrence in L inguinal nodes and is planning on undergoing dissection of these nodes  PAIN:  Are you having pain? No  PRECAUTIONS: Other: L THA 2017, active cancer   WEIGHT BEARING RESTRICTIONS: No   FALLS:  Has patient fallen in last 6 months? no   LIVING ENVIRONMENT: Lives with: lives with their spouse, [name] Lives in: House/apartment Stairs: Yes; Internal: 14 steps; on right going up Has following equipment at home: Single point cane, Walker - 2 wheeled, Crutches, and bed side commode   OCCUPATION: part time, administrative in a school    LEISURE: walks daily for at least 30 min, was playing tennis 3x/wk   HAND DOMINANCE: right    PRIOR LEVEL OF FUNCTION: Independent   PATIENT GOALS: to get in and out of the car without assisting leg, get rid of the swelling, control the swelling, improve mobility     OBJECTIVE:   COGNITION: Overall cognitive status: Within functional limits for tasks assessed      OBSERVATIONS / OTHER ASSESSMENTS: fullness visible throughout R LE especially at R thigh   POSTURE: forward head and rounded shoulders     LYMPHEDEMA ASSESSMENTS:    LOWER EXTREMITY LANDMARK RIGHT 02/11/23 RIGHT  06/06/24 Right 06/12/24  At groin      30 cm proximal to suprapatella   60.3 57.9  20 cm proximal to suprapatella 61.6 58.1 53.7  10 cm proximal to suprapatella 51 49 45.5  At midpatella / popliteal crease 40.9 39.3 38.1  30 cm proximal to floor at lateral plantar foot  37.6 38.2 34.6  20 cm proximal to floor at lateral plantar foot 32.3 32 29.1  10 cm proximal to floor at lateral plantar foot 21.8 22.4 21.1  Circumference of ankle/heel      5 cm proximal to 1st MTP joint 22.5 21.6 21.4  Across MTP joint 22.2 22.8 20.2  Around proximal great toe 7.5 7.8 7.4  (Blank rows = not tested)   LOWER EXTREMITY LANDMARK LEFT 02/11/23 LEFT 06/06/24  At groin     30 cm proximal to suprapatella   58  20 cm proximal to suprapatella 54.5 54  10 cm proximal to suprapatella 47 45.5  At midpatella / popliteal crease 39.9 36  30 cm proximal to floor at lateral plantar foot 37 36.9  20 cm proximal to floor at lateral plantar foot 29.4 28.5  10 cm proximal to floor at lateral plantar foot 21.4 21.5  Circumference of ankle/heel     5 cm proximal to 1st MTP joint 21.4 21.5  Across MTP joint 22 21.5  Around proximal great toe 7.4 7  (Blank rows = not tested)    LOWER EXTREMITY MMT:  MMT Right eval  Hip flexion 5/5  Hip extension   Hip abduction   Hip adduction 3/5  Hip internal rotation   Hip external rotation   Knee flexion 5/5  Knee extension 5/5  Ankle dorsiflexion 5/5  Ankle plantarflexion   Ankle inversion   Ankle eversion    (Blank rows = not tested)  MMT LEFT eval  Hip flexion 5/5  Hip extension   Hip abduction   Hip adduction 4/5  Hip internal rotation   Hip external rotation   Knee flexion 5/5  Knee extension 5/5  Ankle dorsiflexion 5/5  Ankle plantarflexion   Ankle inversion   Ankle eversion    (Blank rows = not tested)    LLIS:  Flowsheet Row Outpatient Rehab from 06/06/2024 in [facility]  Lymphedema Life Impact Scale Total Score 50 %   Lower Extremity Functional Score: 64 / 80 = 80.0 %                                                                                                                            TREATMENT DATE:  06/12/24: Self Care Assessed pts compression bandage while removing bandages  explaining importance of even spacing. She had also had extra bandage and had just started bringing bandage back down leg at knee and then again at thigh. So explained that even spacing will correct this issue because the bandage needs to be evenly spread on leg for most efficacy. Then began instructing pt in self MLD as time allowed, see below. Spent time first educating her about basics of anatomy of lymphtaic system and basic principles of MLD.  Compression  Bandaging Pt video recorder therapist applying bandages so she could have this as reference at home when reapplying later.  TG soft to knee to groin, and then to foot up to knee: Artiflex from foot to below pop fossa , 1 6 cm at foot in combined Roman sandal and ASH pattern, 1 8 cm spiral from ankle to knee, 1 10 cm from ankle to knee in figure 8, folded knee high stockinette over top of bandage, then applied artiflex from knee with extra at pop fossa, to groin; 1 12 cm from just below knee to mid thigh with X behind knee then 12 cm at knee to groin in spiral with TG soft folded over. Reviewed technique and spacing with pt while applying. Also encouraged her to wear her bike shorts over bandage to help t slow inevitable slippage of bandage.  Manual Therapy MLD: Left pts bandage on after instruction due to time: Began educating her on the pretreat of MLD as time allowed at end of session after bandaging instruction. SCF, 5 diaphragmtaic breaths (spent time educating pt in proper diaphragmatic breathing and encouraged her to cont working on this at home), Rt axillary lymph nodes, and Rt inguino-axillary anastomosis.  06/07/24: Spent entire session demonstrating pt how to apply compression bandaging to RLE and having pt return demo as follows with pt seated edge of mat: TG soft small from foot to knee, TG soft medium from below knee to groin, Artiflex from foot to knee (extra at popliteal fold) and then from just below knee to groin, 1 6 cm at foot in  HAS pattern, 1 8 cm from ankle to knee, 1 10 cm from ankle to knee in figure 8, 1 12 cm from just below knee to mid thigh with x behind knee then 12 cm from just below knee to groin in spiral with TG soft folded over. Had pt return demo each step and wrote out instructions for pt. Educated pt when to remove- see education section  06/06/24- educated pt about the obturator nerve and how it can lead to the weakness and pain she has been having and how lymphedema in the RLE can further impact this   PATIENT EDUCATION:  Education details:don't keep bandages on more than 2 days, re wrap when they get loose, remove if there is increased pain, swelling in toes, turns blue, numbness and tingling that does not go away when you move Person educated: Patient Education method: Explanation Education comprehension: verbalized understanding  HOME EXERCISE PROGRAM: None yet  ASSESSMENT:  CLINICAL IMPRESSION: Continued with education and instruction of compression bandaging. Overall pt had applied them in correct order. She did benefit from review of proper spacing of bandages, hers were too wide allowing for extra bandage at top. Also corrected her foot technique and advised her to have foot bandage end at ankle, not on foot where it can easily unravel. Pt verbalized good understanding of all instructions and will be ready to start learning full sequence of self MLD next after another review of her bandaging when we remove it at next session. Was able to begin instructing her in pre treat steps today and started instructing in light pressure and skin stretch only.   OBJECTIVE IMPAIRMENTS: decreased knowledge of condition, decreased knowledge of use of DME, decreased mobility, difficulty walking, decreased ROM, decreased strength, increased edema, impaired flexibility, and pain.   ACTIVITY LIMITATIONS: squatting, stairs, bed mobility, and locomotion level  PARTICIPATION LIMITATIONS: community  activity  PERSONAL FACTORS: Time since onset  of injury/illness/exacerbation and 1 comorbidity: diabetes are also affecting patient's functional outcome.   REHAB POTENTIAL: Good  CLINICAL DECISION MAKING: Evolving/moderate complexity  EVALUATION COMPLEXITY: Moderate   GOALS: Goals reviewed with patient? Yes  SHORT TERM GOALS: Target date: 07/04/24  Pt will be independent in compression bandaging for long term management of lymphedema.  Baseline: Goal status: INITIAL  2.  Pt will demonstrate a 1 cm decrease in edema 20 cm superior to suprapatella to decrease risk of infection.  Baseline:  Goal status: INITIAL  3.  Pt will be independent in initial HEP for LE strengthening/stretching.  Baseline:  Goal status: INITIAL   LONG TERM GOALS: Target date: 08/01/2024   Pt will demonstrate a 2 cm decrease in edema 20 cm superior to suprapatella to decrease risk of infection.  Baseline:  Goal status: INITIAL  2.  Pt will obtain appropriate compression garments for long term management of RLE lymphedema. Baseline:  Goal status: INITIAL  3.  Pt will report she is able to get in and out of the car without lifting her leg Baseline:  Goal status: INITIAL  4.  Pt will be able to lie comfortably in bed on her back without increased pain.  Baseline:  Goal status: INITIAL  5.  Pt will demonstrate 3+/5 strength in R hip adductors to allow improved mobility.  Baseline:  Goal status: INITIAL   PLAN:  PT FREQUENCY: 2x/week  PT DURATION: 8 weeks  PLANNED INTERVENTIONS: [id number]- PT Re-evaluation, 97110-Therapeutic exercises, 97530- Therapeutic activity, 97112- Neuromuscular re-education, 97535- Self Care, [id number]- Manual therapy, [phone]- Orthotic Initial, [phone]- Orthotic/Prosthetic subsequent, Patient/Family education, Balance training, Joint mobilization, Therapeutic exercises, Therapeutic activity, Neuromuscular re-education, Gait training, and Self Care  PLAN FOR NEXT SESSION: Cont to  review bandaging technique for Rt LE prn,  cont instruction of self MLD and issue handout for this; begin complete CDT for R LE, R hip adductor strengthening, R LE strengthening, eventually measure for RLE garment once maximally reduced   Aden Berwyn Caldron, PTA 06/12/2024, 5:26 PM

## 2024-06-12 NOTE — Telephone Encounter (Signed)
 Called patient with tumor board discussion.  Will plan to get patient scheduled for surgery for removal of the pelvic lymph node.  Tentative date of 07/11/2024.  Will also work to get patient added on for a preoperative telephone visit.  All questions answered.  Will have clinic reach out to get everything scheduled.

## 2024-06-14 ENCOUNTER — Encounter: Payer: Self-pay | Admitting: Physical Therapy

## 2024-06-14 ENCOUNTER — Ambulatory Visit: Admitting: Physical Therapy

## 2024-06-14 DIAGNOSIS — R262 Difficulty in walking, not elsewhere classified: Secondary | ICD-10-CM

## 2024-06-14 DIAGNOSIS — C549 Malignant neoplasm of corpus uteri, unspecified: Secondary | ICD-10-CM

## 2024-06-14 DIAGNOSIS — I890 Lymphedema, not elsewhere classified: Secondary | ICD-10-CM

## 2024-06-14 DIAGNOSIS — M6281 Muscle weakness (generalized): Secondary | ICD-10-CM

## 2024-06-14 NOTE — Therapy (Signed)
 OUTPATIENT PHYSICAL THERAPY  LOWER EXTREMITY ONCOLOGY TREATMENT  Patient Name: [name] MRN: [id number] DOB:Oct 12, 1950, 73 y.o., female Today's Date: 06/14/2024  END OF SESSION:  PT End of Session - 06/14/24 0804     Visit Number 4    Number of Visits 17    Date for Recertification  08/01/24    PT Start Time 0803    PT Stop Time 0851    PT Time Calculation (min) 48 min    Activity Tolerance Patient tolerated treatment well    Behavior During Therapy WFL for tasks assessed/performed          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   Top of Left Hand(Tx p Bx)   Past Surgical History:  Procedure Laterality Date   CARPAL TUNNEL RELEASE     BILAT     CATARACT EXTRACTION     BILAT    ECTOPIC PREGNANCY SURGERY     in the setting of prior BTL, lapartomy   EYE SURGERY     IR IMAGING GUIDED PORT INSERTION  12/02/2022   KNEE CARTILAGE SURGERY     RIGHT    (MENISCUS)   LAPAROTOMY N/A 02/16/2023   Procedure: MINI LAPAROTOMY;  Surgeon: Eldonna Mays, MD;  Location: [facility];  Service: Gynecology;  Laterality: N/A;   LYMPH NODE DISSECTION Right 02/16/2023   Procedure: RIGHT PELVIC LYMPH NODE DISSECTION;  Surgeon: Eldonna Mays, MD;  Location: [facility];  Service: Gynecology;  Laterality: Right;   MOHS SURGERY     left nose/inner eye for basal cell   TONSILLECTOMY     TOTAL HIP ARTHROPLASTY Left 12/19/2015   TOTAL HIP ARTHROPLASTY Left 12/19/2015   Procedure: LEFT  TOTAL HIP ARTHROPLASTY;  Surgeon: Maude LELON Right, MD;  Location: [facility];  Service: Orthopedics;  Laterality: Left;   TRIGGER FINGER RELEASE     TUBAL LIGATION     Patient Active Problem List   Diagnosis Date Noted   Insulin  dependent type 1 diabetes mellitus (HCC) 02/08/2024   Menopausal symptoms 02/08/2024   DKA (diabetic ketoacidosis) (HCC) 10/31/2023   Colitis 10/31/2023   Anemia due to antineoplastic chemotherapy 08/27/2023   Rash and nonspecific skin eruption 08/27/2023   Abdominal pain 06/01/2023   Peripheral neuropathy due to chemotherapy 03/19/2023   Family history of uterine cancer    Family history of kidney cancer    Hot flashes 01/15/2023   Multiple thyroid  nodules 12/21/2022   Acute deep vein thrombosis (DVT) of right lower extremity (HCC) 11/20/2022   Mild anemia 11/20/2022   Uterine cancer (HCC) 11/19/2022   Unilateral primary osteoarthritis, left knee 08/05/2022   Unilateral primary osteoarthritis, right knee 08/05/2022   Bilateral primary osteoarthritis of knee 05/06/2022   Primary osteoarthritis of left hip 12/19/2015   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   Hypothyroidism due to Hashimoto's thyroiditis  10/15/2015   Degenerative disc disease    Arthritis     PCP: Ike Ditty, MD  REFERRING PROVIDER: Eleanor Epps, NP  REFERRING DIAG:  C54.9 (ICD-10-CM) - Malignant neoplasm of body of uterus, unspecified site (HCC) R60.0 (ICD-10-CM) - Lower extremity edema  THERAPY DIAG:  Lymphedema, not elsewhere classified  Muscle weakness (generalized)  Difficulty in walking, not elsewhere classified  Malignant neoplasm of body of uterus, unspecified site [facility])  ONSET DATE: 02/16/23  Rationale for Evaluation and Treatment: Rehabilitation  SUBJECTIVE:                                                                                                                                                                                            SUBJECTIVE STATEMENT: I took the bandages off to play tennis but left on the TG soft and my exercise shorts and then I rewrapped it. I am having surgery on January 13th.   PERTINENT HISTORY: Underwent total hysterectomy and bilateral salpingo oophorectomy with omenectomy on 02/16/23, Uterine cancer 10/23/22 with pelvic lymphadenopathy did neoadj chemo, DVT R LE, needs R TKA, type 1 diabetes from [place] , has recurrence in L inguinal nodes and is planning on undergoing dissection of these nodes  PAIN:  Are you having pain? No  PRECAUTIONS: Other: L THA 2017, active cancer   WEIGHT BEARING RESTRICTIONS: No   FALLS:  Has patient fallen in last 6 months? no   LIVING ENVIRONMENT: Lives with: lives with their spouse, [name] Lives in: House/apartment Stairs: Yes; Internal: 14 steps; on right going up Has following equipment at home: Single point cane, Walker - 2 wheeled, Crutches, and bed side commode   OCCUPATION: part time, administrative in a school    LEISURE: walks daily for at least 30 min, was playing tennis 3x/wk   HAND DOMINANCE: right    PRIOR LEVEL OF FUNCTION: Independent   PATIENT GOALS: to get in and out of the car without assisting leg, get rid of the swelling, control the swelling, improve mobility     OBJECTIVE:   COGNITION: Overall cognitive status: Within functional limits for tasks assessed      OBSERVATIONS / OTHER ASSESSMENTS: fullness visible throughout R LE especially at R thigh   POSTURE: forward head and rounded shoulders     LYMPHEDEMA ASSESSMENTS:    LOWER EXTREMITY LANDMARK RIGHT 02/11/23 RIGHT  06/06/24 Right 06/12/24  At groin      30 cm proximal to suprapatella   60.3 57.9  20 cm proximal to suprapatella 61.6 58.1 53.7  10 cm proximal to suprapatella 51 49 45.5  At midpatella / popliteal crease 40.9 39.3 38.1  30 cm proximal to floor at lateral plantar foot 37.6 38.2 34.6  20 cm proximal to floor at lateral plantar foot 32.3 32 29.1  10  cm proximal to floor at lateral plantar foot 21.8 22.4 21.1  Circumference of ankle/heel      5 cm proximal to 1st MTP joint 22.5 21.6 21.4  Across MTP joint 22.2 22.8 20.2  Around proximal great toe 7.5 7.8 7.4  (Blank rows = not tested)   LOWER EXTREMITY LANDMARK LEFT 02/11/23 LEFT 06/06/24  At groin     30 cm proximal to suprapatella   58  20 cm proximal to suprapatella 54.5 54  10 cm proximal to suprapatella 47 45.5  At midpatella / popliteal crease 39.9 36  30 cm proximal to floor at lateral plantar foot 37 36.9  20 cm proximal to floor at lateral plantar foot 29.4 28.5  10 cm proximal to floor at lateral plantar foot 21.4 21.5  Circumference of ankle/heel     5 cm proximal to 1st MTP joint 21.4 21.5  Across MTP joint 22 21.5  Around proximal great toe 7.4 7  (Blank rows = not tested)    LOWER EXTREMITY MMT:  MMT Right eval  Hip flexion 5/5  Hip extension   Hip abduction   Hip adduction 3/5  Hip internal rotation   Hip external rotation   Knee flexion 5/5  Knee extension 5/5  Ankle dorsiflexion 5/5  Ankle plantarflexion   Ankle inversion   Ankle eversion    (Blank rows = not tested)  MMT LEFT eval  Hip flexion 5/5  Hip extension   Hip abduction   Hip adduction 4/5  Hip internal rotation   Hip external rotation   Knee flexion 5/5  Knee extension 5/5  Ankle dorsiflexion 5/5  Ankle plantarflexion   Ankle inversion   Ankle eversion    (Blank rows = not tested)    LLIS:  Flowsheet Row Outpatient Rehab from 06/06/2024 in [facility]  Lymphedema Life Impact Scale Total Score 50 %   Lower Extremity Functional Score: 64 / 80 = 80.0 %                                                                                                                            TREATMENT DATE:  06/14/24:  Orthotic Fit: Discussed flat knit vs circular knit and measured pt for flat knit day time garment and night time garment for management of  lymphedema. Pt fit in to a Exo Strong Thigh High Flat knit off the shelf with silicone band to keep it from sliding size Medium, Average Length. For the night time garment she fit in to an off the shelf Circaid Profile Thigh High Size III - short with energy over sleeve for additional compression with blue giraffe print.  Manual Therapy: Removed bandages at beginning of session Lotion from foot to thigh then TG soft to knee to groin, and then  to foot up to knee: Artiflex from foot to below pop fossa , 1 6 cm at foot in combined Roman sandal and ASH pattern, 1 8 cm spiral from ankle to knee, 1 10 cm from ankle to knee in figure 8, folded knee high stockinette over top of bandage, then applied artiflex from knee with extra at pop fossa, to groin; 1 12 cm from just below knee to mid thigh with X behind knee then 12 cm at knee to groin in spiral with TG soft folded over.  06/12/24: Self Care Assessed pts compression bandage while removing bandages explaining importance of even spacing. She had also had extra bandage and had just started bringing bandage back down leg at knee and then again at thigh. So explained that even spacing will correct this issue because the bandage needs to be evenly spread on leg for most efficacy. Then began instructing pt in self MLD as time allowed, see below. Spent time first educating her about basics of anatomy of lymphtaic system and basic principles of MLD.  Compression Bandaging Pt video recorder therapist applying bandages so she could have this as reference at home when reapplying later.  TG soft to knee to groin, and then to foot up to knee: Artiflex from foot to below pop fossa , 1 6 cm at foot in combined Roman sandal and ASH pattern, 1 8 cm spiral from ankle to knee, 1 10 cm from ankle to knee in figure 8, folded knee high stockinette over top of bandage, then applied artiflex from knee with extra at pop fossa, to groin; 1 12 cm from just below knee to mid thigh with  X behind knee then 12 cm at knee to groin in spiral with TG soft folded over. Reviewed technique and spacing with pt while applying. Also encouraged her to wear her bike shorts over bandage to help t slow inevitable slippage of bandage.  Manual Therapy MLD: Left pts bandage on after instruction due to time: Began educating her on the pretreat of MLD as time allowed at end of session after bandaging instruction. SCF, 5 diaphragmtaic breaths (spent time educating pt in proper diaphragmatic breathing and encouraged her to cont working on this at home), Rt axillary lymph nodes, and Rt inguino-axillary anastomosis.  06/07/24: Spent entire session demonstrating pt how to apply compression bandaging to RLE and having pt return demo as follows with pt seated edge of mat: TG soft small from foot to knee, TG soft medium from below knee to groin, Artiflex from foot to knee (extra at popliteal fold) and then from just below knee to groin, 1 6 cm at foot in HAS pattern, 1 8 cm from ankle to knee, 1 10 cm from ankle to knee in figure 8, 1 12 cm from just below knee to mid thigh with x behind knee then 12 cm from just below knee to groin in spiral with TG soft folded over. Had pt return demo each step and wrote out instructions for pt. Educated pt when to remove- see education section  06/06/24- educated pt about the obturator nerve and how it can lead to the weakness and pain she has been having and how lymphedema in the RLE can further impact this   PATIENT EDUCATION:  Education details:don't keep bandages on more than 2 days, re wrap when they get loose, remove if there is increased pain, swelling in toes, turns blue, numbness and tingling that does not go away when you move Person educated: Patient  Education method: Explanation Education comprehension: verbalized understanding  HOME EXERCISE PROGRAM: None yet  ASSESSMENT:  CLINICAL IMPRESSION: Pt will have her next lymph node dissection on January 13th  2026 on her LLE. She currently has lymphedema of her R LE from previous total hysterectomy and bilateral salpingo oophorectomy with omenectomy on 02/16/23 for treatment of uterine cancer. Her surgery was extensive in her groin and she developed lymphedema after this. 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 She has reduced greatly with compression bandaging and would benefit from compression garments for day and night time for long term management of lymphedema. She would benefit from receiving these garments prior to her next surgery since she may have mobility issues and difficulty applying compression bandaging following her surgery on Jan 13. She will need flat knit 20-2mm Hg thigh high since her swelling is from foot to groin with a silicone border to keep it from sliding down. Also the flat knit is more containing than circular knit. She will also need a circaid profile thigh high with the oversleeve for additioanl compression (ready made) to control her lymphedema while she sleeps.  OBJECTIVE IMPAIRMENTS: decreased knowledge of condition, decreased knowledge of use of DME, decreased mobility, difficulty walking, decreased ROM, decreased strength, increased edema, impaired flexibility, and pain.   ACTIVITY LIMITATIONS: squatting, stairs, bed mobility, and locomotion level  PARTICIPATION LIMITATIONS: community activity  PERSONAL FACTORS: Time since onset of injury/illness/exacerbation and 1 comorbidity: diabetes are also affecting patient's functional outcome.   REHAB POTENTIAL: Good  CLINICAL DECISION MAKING: Evolving/moderate complexity  EVALUATION COMPLEXITY: Moderate   GOALS: Goals reviewed with patient? Yes  SHORT TERM GOALS: Target date: 07/04/24  Pt will be independent in compression bandaging for long term management of lymphedema.  Baseline: Goal status: INITIAL  2.  Pt will demonstrate a 1 cm decrease in edema 20 cm superior to suprapatella to  decrease risk of infection.  Baseline:  Goal status: INITIAL  3.  Pt will be independent in initial HEP for LE strengthening/stretching.  Baseline:  Goal status: INITIAL   LONG TERM GOALS: Target date: 08/01/2024   Pt will demonstrate a 2 cm decrease in edema 20 cm superior to suprapatella to decrease risk of infection.  Baseline:  Goal status: INITIAL  2.  Pt will obtain appropriate compression garments for long term management of RLE lymphedema. Baseline:  Goal status: INITIAL  3.  Pt will report she is able to get in and out of the car without lifting her leg Baseline:  Goal status: INITIAL  4.  Pt will be able to lie comfortably in bed on her back without increased pain.  Baseline:  Goal status: INITIAL  5.  Pt will demonstrate 3+/5 strength in R hip adductors to allow improved mobility.  Baseline:  Goal status: INITIAL   PLAN:  PT FREQUENCY: 2x/week  PT DURATION: 8 weeks  PLANNED INTERVENTIONS: [id number]- PT Re-evaluation, 97110-Therapeutic exercises, 97530- Therapeutic activity, 97112- Neuromuscular re-education, 97535- Self Care, [id number]- Manual therapy, [phone]- Orthotic Initial, [phone]- Orthotic/Prosthetic subsequent, Patient/Family education, Balance training, Joint mobilization, Therapeutic exercises, Therapeutic activity, Neuromuscular re-education, Gait training, and Self Care  PLAN FOR NEXT SESSION: Cont to review bandaging technique for Rt LE prn,  cont instruction of self MLD and issue handout for this; begin complete CDT for R LE, R hip adductor strengthening, R LE strengthening, eventually measure for RLE garment once maximally reduced, info sent to [facility] on 06/14/24   [facility], PT 06/14/2024,  12:05 PM

## 2024-06-20 ENCOUNTER — Telehealth: Payer: Self-pay | Admitting: *Deleted

## 2024-06-20 ENCOUNTER — Ambulatory Visit

## 2024-06-20 NOTE — Telephone Encounter (Signed)
 Called and gave the patient pre op appts

## 2024-06-21 ENCOUNTER — Ambulatory Visit

## 2024-06-21 DIAGNOSIS — R262 Difficulty in walking, not elsewhere classified: Secondary | ICD-10-CM

## 2024-06-21 DIAGNOSIS — I890 Lymphedema, not elsewhere classified: Secondary | ICD-10-CM

## 2024-06-21 DIAGNOSIS — M6281 Muscle weakness (generalized): Secondary | ICD-10-CM

## 2024-06-21 DIAGNOSIS — C549 Malignant neoplasm of corpus uteri, unspecified: Secondary | ICD-10-CM

## 2024-06-21 NOTE — Patient Instructions (Addendum)
 [name]

## 2024-06-21 NOTE — Therapy (Signed)
 " OUTPATIENT PHYSICAL THERAPY  LOWER EXTREMITY ONCOLOGY TREATMENT  Patient Name: [name] MRN: [id number] DOB:10-08-1950, 73 y.o., female Today's Date: 06/21/2024  END OF SESSION:  PT End of Session - 06/21/24 0912     Visit Number 5    Number of Visits 17    Date for Recertification  08/01/24    PT Start Time 0904    PT Stop Time 1000    PT Time Calculation (min) 56 min    Activity Tolerance Patient tolerated treatment well    Behavior During Therapy WFL for tasks assessed/performed          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   Top of Left Hand(Tx p Bx)   Past Surgical History:  Procedure Laterality Date   CARPAL TUNNEL RELEASE     BILAT     CATARACT EXTRACTION     BILAT    ECTOPIC PREGNANCY SURGERY     in the setting of prior BTL, lapartomy   EYE SURGERY     IR IMAGING GUIDED PORT INSERTION  12/02/2022   KNEE CARTILAGE SURGERY     RIGHT    (MENISCUS)   LAPAROTOMY N/A 02/16/2023   Procedure: MINI LAPAROTOMY;  Surgeon: Eldonna Mays, MD;  Location: [facility];  Service: Gynecology;  Laterality: N/A;   LYMPH NODE DISSECTION Right 02/16/2023   Procedure: RIGHT PELVIC LYMPH NODE DISSECTION;  Surgeon: Eldonna Mays, MD;  Location: [facility];  Service: Gynecology;  Laterality: Right;   MOHS SURGERY     left nose/inner eye for basal cell   TONSILLECTOMY     TOTAL HIP ARTHROPLASTY Left 12/19/2015   TOTAL HIP ARTHROPLASTY Left 12/19/2015   Procedure: LEFT  TOTAL HIP ARTHROPLASTY;  Surgeon: Maude LELON Right, MD;  Location: [facility];  Service: Orthopedics;  Laterality: Left;   TRIGGER FINGER RELEASE     TUBAL LIGATION     Patient Active Problem List   Diagnosis Date Noted   Insulin  dependent type 1 diabetes mellitus (HCC) 02/08/2024   Menopausal symptoms 02/08/2024   DKA (diabetic ketoacidosis) (HCC) 10/31/2023   Colitis 10/31/2023   Anemia due to antineoplastic chemotherapy 08/27/2023   Rash and nonspecific skin eruption 08/27/2023   Abdominal pain 06/01/2023   Peripheral neuropathy due to chemotherapy 03/19/2023   Family history of uterine cancer    Family history of kidney cancer    Hot flashes 01/15/2023   Multiple thyroid  nodules 12/21/2022   Acute deep vein thrombosis (DVT) of right lower extremity (HCC) 11/20/2022   Mild anemia 11/20/2022   Uterine cancer (HCC) 11/19/2022   Unilateral primary osteoarthritis, left knee 08/05/2022   Unilateral primary osteoarthritis, right knee 08/05/2022   Bilateral primary osteoarthritis of knee 05/06/2022   Primary osteoarthritis of left hip 12/19/2015   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   Hypothyroidism due to Hashimoto's  thyroiditis 10/15/2015   Degenerative disc disease    Arthritis     PCP: Ike Ditty, MD  REFERRING PROVIDER: Eleanor Epps, NP  REFERRING DIAG:  C54.9 (ICD-10-CM) - Malignant neoplasm of body of uterus, unspecified site (HCC) R60.0 (ICD-10-CM) - Lower extremity edema  THERAPY DIAG:  Lymphedema, not elsewhere classified  Muscle weakness (generalized)  Difficulty in walking, not elsewhere classified  Malignant neoplasm of body of uterus, unspecified site [facility])  ONSET DATE: 02/16/23  Rationale for Evaluation and Treatment: Rehabilitation  SUBJECTIVE:                                                                                                                                                                                            SUBJECTIVE STATEMENT: My new garments came in! I just brought everything.   PERTINENT HISTORY: Underwent total hysterectomy and bilateral salpingo oophorectomy with omenectomy on 02/16/23, Uterine cancer 10/23/22 with pelvic lymphadenopathy did neoadj chemo, DVT R LE, needs R TKA, type 1 diabetes from Keytruda , has recurrence in L inguinal nodes and is planning on undergoing dissection of these nodes  PAIN:  Are you having pain? No  PRECAUTIONS: Other: L THA 2017, active cancer   WEIGHT BEARING RESTRICTIONS: No   FALLS:  Has patient fallen in last 6 months? no   LIVING ENVIRONMENT: Lives with: lives with their spouse, [name] Lives in: House/apartment Stairs: Yes; Internal: 14 steps; on right going up Has following equipment at home: Single point cane, Walker - 2 wheeled, Crutches, and bed side commode   OCCUPATION: part time, administrative in a school    LEISURE: walks daily for at least 30 min, was playing tennis 3x/wk   HAND DOMINANCE: right    PRIOR LEVEL OF FUNCTION: Independent   PATIENT GOALS: to get in and out of the car without assisting leg, get rid of the swelling, control the swelling, improve mobility     OBJECTIVE:   COGNITION: Overall cognitive status: Within functional limits for tasks assessed      OBSERVATIONS / OTHER ASSESSMENTS: fullness visible throughout R LE especially at R thigh   POSTURE: forward head and rounded shoulders     LYMPHEDEMA ASSESSMENTS:    LOWER EXTREMITY LANDMARK RIGHT 02/11/23 RIGHT  06/06/24 Right 06/12/24 Right 06/21/24  At groin       30 cm proximal to suprapatella   60.3 57.9 56.4  20 cm proximal to suprapatella 61.6 58.1 53.7 53.6  10 cm proximal to suprapatella 51 49 45.5 45.8  At midpatella / popliteal crease 40.9 39.3 38.1 37.1  30 cm proximal to floor at lateral plantar foot 37.6 38.2 34.6 34.9  20 cm proximal to floor at lateral plantar foot 32.3 32 29.1 29.1  10 cm proximal to floor at lateral plantar foot 21.8  22.4 21.1 21.1  Circumference of ankle/heel       5 cm proximal to 1st MTP joint 22.5 21.6 21.4 21.8  Across MTP joint 22.2 22.8 20.2 20.8  Around proximal great toe 7.5 7.8 7.4 7.8  (Blank rows = not tested)   LOWER EXTREMITY LANDMARK LEFT 02/11/23 LEFT 06/06/24  At groin     30 cm proximal to suprapatella   58  20 cm proximal to suprapatella 54.5 54  10 cm proximal to suprapatella 47 45.5  At midpatella / popliteal crease 39.9 36  30 cm proximal to floor at lateral plantar foot 37 36.9  20 cm proximal to floor at lateral plantar foot 29.4 28.5  10 cm proximal to floor at lateral plantar foot 21.4 21.5  Circumference of ankle/heel     5 cm proximal to 1st MTP joint 21.4 21.5  Across MTP joint 22 21.5  Around proximal great toe 7.4 7  (Blank rows = not tested)    LOWER EXTREMITY MMT:  MMT Right eval  Hip flexion 5/5  Hip extension   Hip abduction   Hip adduction 3/5  Hip internal rotation   Hip external rotation   Knee flexion 5/5  Knee extension 5/5  Ankle dorsiflexion 5/5  Ankle plantarflexion   Ankle inversion   Ankle eversion    (Blank rows = not tested)  MMT LEFT eval  Hip flexion 5/5  Hip extension   Hip abduction   Hip adduction 4/5  Hip internal rotation   Hip external rotation   Knee flexion 5/5  Knee extension 5/5  Ankle dorsiflexion 5/5  Ankle plantarflexion   Ankle inversion   Ankle eversion    (Blank rows = not tested)    LLIS:  Flowsheet Row Outpatient Rehab from 06/06/2024 in [facility]  Lymphedema Life Impact Scale Total Score 50 %   Lower Extremity Functional Score: 64 / 80 = 80.0 %                                                                                                                            TREATMENT DATE:  06/21/24: Orthotic Fit Training Pt brought her new day and nighttime compression garment and spent most of session instructing pt in proper donning and doffing of both. Also instructed her  in use of slippy gator with day time garment. She was able to return excellent demo and verbalize good understanding of proper fit. Both garments fit well and pt reports these feeling good as well.  Manual Therapy Circumference measurements taken.  MLD review with pt performing on herself mirroring therapist demo and PTA offering hand over hand cuing prn for correct pressure and directionality of skin stretches. Handout issued as well for further reinforcement.   06/14/24:  Orthotic Fit: Discussed flat knit vs  circular knit and measured pt for flat knit day time garment and night time garment for management of lymphedema. Pt fit in to a Exo Strong Thigh High Flat knit off the shelf with silicone band to keep it from sliding size Medium, Average Length. For the night time garment she fit in to an off the shelf Circaid Profile Thigh High Size III - short with energy over sleeve for additional compression with blue giraffe print.  Manual Therapy: Removed bandages at beginning of session Lotion from foot to thigh then TG soft to knee to groin, and then to foot up to knee: Artiflex from foot to below pop fossa , 1 6 cm at foot in combined Roman sandal and ASH pattern, 1 8 cm spiral from ankle to knee, 1 10 cm from ankle to knee in figure 8, folded knee high stockinette over top of bandage, then applied artiflex from knee with extra at pop fossa, to groin; 1 12 cm from just below knee to mid thigh with X behind knee then 12 cm at knee to groin in spiral with TG soft folded over.  06/12/24: Self Care Assessed pts compression bandage while removing bandages explaining importance of even spacing. She had also had extra bandage and had just started bringing bandage back down leg at knee and then again at thigh. So explained that even spacing will correct this issue because the bandage needs to be evenly spread on leg for most efficacy. Then began instructing pt in self MLD as time allowed, see below. Spent  time first educating her about basics of anatomy of lymphtaic system and basic principles of MLD.  Compression Bandaging Pt video recorder therapist applying bandages so she could have this as reference at home when reapplying later.  TG soft to knee to groin, and then to foot up to knee: Artiflex from foot to below pop fossa , 1 6 cm at foot in combined Roman sandal and ASH pattern, 1 8 cm spiral from ankle to knee, 1 10 cm from ankle to knee in figure 8, folded knee high stockinette over top of bandage, then applied artiflex from knee with extra at pop fossa, to groin; 1 12 cm from just below knee to mid thigh with X behind knee then 12 cm at knee to groin in spiral with TG soft folded over. Reviewed technique and spacing with pt while applying. Also encouraged her to wear her bike shorts over bandage to help t slow inevitable slippage of bandage.  Manual Therapy MLD: Left pts bandage on after instruction due to time: Began educating her on the pretreat of MLD as time allowed at end of session after bandaging instruction. SCF, 5 diaphragmtaic breaths (spent time educating pt in proper diaphragmatic breathing and encouraged her to cont working on this at home), Rt axillary lymph nodes, and Rt inguino-axillary anastomosis.  06/07/24: Spent entire session demonstrating pt how to apply compression bandaging to RLE and having pt return demo as follows with pt seated edge of mat: TG soft small from foot to knee, TG soft medium from below knee to groin, Artiflex from foot to knee (extra at popliteal fold) and then from just below knee to groin, 1 6 cm at foot in HAS pattern, 1 8 cm from ankle to knee, 1 10 cm from ankle to knee in figure 8, 1 12 cm from just below knee to mid thigh with x behind knee then 12 cm from just below knee to groin in spiral with TG  soft folded over. Had pt return demo each step and wrote out instructions for pt. Educated pt when to remove- see education section  06/06/24- educated pt  about the obturator nerve and how it can lead to the weakness and pain she has been having and how lymphedema in the RLE can further impact this   PATIENT EDUCATION:  Education details:don't keep bandages on more than 2 days, re wrap when they get loose, remove if there is increased pain, swelling in toes, turns blue, numbness and tingling that does not go away when you move Person educated: Patient Education method: Explanation Education comprehension: verbalized understanding  HOME EXERCISE PROGRAM: None yet  ASSESSMENT:  CLINICAL IMPRESSION: Pt brought her new day and nighttime garments. Spent most of session instructing pt in proper donning and doffing of both garments, including use of slippy gator. Pt was able to return excellent demo and verbalize understanding of how how garments should fit leg. Also advised her to get rubber gloves (garden or kitchen) to help with adjusting daytime garment once on. Then reviewed self MLD with pt with her returning therapist demo and hand over hand technique for correct pressure and directionality of stretch prn. She is going to reduce appts to 2-3 more visits before surgery to reassess circumference measurements and work towards independence with self MLD.   OBJECTIVE IMPAIRMENTS: decreased knowledge of condition, decreased knowledge of use of DME, decreased mobility, difficulty walking, decreased ROM, decreased strength, increased edema, impaired flexibility, and pain.   ACTIVITY LIMITATIONS: squatting, stairs, bed mobility, and locomotion level  PARTICIPATION LIMITATIONS: community activity  PERSONAL FACTORS: Time since onset of injury/illness/exacerbation and 1 comorbidity: diabetes are also affecting patient's functional outcome.   REHAB POTENTIAL: Good  CLINICAL DECISION MAKING: Evolving/moderate complexity  EVALUATION COMPLEXITY: Moderate   GOALS: Goals reviewed with patient? Yes  SHORT TERM GOALS: Target date: 07/04/24  Pt will be  independent in compression bandaging for long term management of lymphedema.  Baseline: Goal status: INITIAL  2.  Pt will demonstrate a 1 cm decrease in edema 20 cm superior to suprapatella to decrease risk of infection.  Baseline:  Goal status: INITIAL  3.  Pt will be independent in initial HEP for LE strengthening/stretching.  Baseline:  Goal status: INITIAL   LONG TERM GOALS: Target date: 08/01/2024   Pt will demonstrate a 2 cm decrease in edema 20 cm superior to suprapatella to decrease risk of infection.  Baseline:  Goal status: INITIAL  2.  Pt will obtain appropriate compression garments for long term management of RLE lymphedema. Baseline:  Goal status: INITIAL  3.  Pt will report she is able to get in and out of the car without lifting her leg Baseline:  Goal status: INITIAL  4.  Pt will be able to lie comfortably in bed on her back without increased pain.  Baseline:  Goal status: INITIAL  5.  Pt will demonstrate 3+/5 strength in R hip adductors to allow improved mobility.  Baseline:  Goal status: INITIAL   PLAN:  PT FREQUENCY: 2x/week  PT DURATION: 8 weeks  PLANNED INTERVENTIONS: [id number]- PT Re-evaluation, 97110-Therapeutic exercises, 97530- Therapeutic activity, 97112- Neuromuscular re-education, 97535- Self Care, [id number]- Manual therapy, [phone]- Orthotic Initial, [phone]- Orthotic/Prosthetic subsequent, Patient/Family education, Balance training, Joint mobilization, Therapeutic exercises, Therapeutic activity, Neuromuscular re-education, Gait training, and Self Care  PLAN FOR NEXT SESSION: Freq reduced now that pt has new compression garments. Will come until she feels confident with her self MLD and to monitor effectiveness/fit of new  garments.    Aden Berwyn Caldron, PTA 06/21/2024, 10:27 AM  Start with circles near neck placing opposite hand behind collarbone. Then direction of circle is straight back and in towards neck. (Make sure Rt hand is behind port  line, not on)  Deep Effective Breath   Standing, sitting, or laying down place both hands on the belly. Take a deep breath IN, expanding the belly; then breath OUT, contracting the belly. Repeat __5__ times. Do __2-3__ sessions per day and before each self massage.   Inguinal Nodes to Axilla - Clear   On involved side, at armpit, make _5__ in-place circles. Then from hip proceed in sections to armpit with stationary circles or pumps _5_ times, this is your pathway. Do _1__ time per day.  Copyright  [employer]. All rights reserved.  LEG: Knee to Hip - Clear   Pump up outer thigh of involved leg from knee to outer hip. Then do stationary circles from inner to outer thigh, then do outer thigh again. Next, interlace fingers behind knee and make in-place circles. Do _5_ times of each sequence.  Do _1__ time per day.  Copyright  [employer]. All rights reserved.  LEG: Ankle to Hip Sweep   Hands on sides of ankle of involved leg, pump _5__ times up both sides of lower leg. Do _2-3_ times. Do __1_ time per day.  Copyright  [employer]. All rights reserved.  FOOT: Dorsum of Foot and Toes Massage   One hand on top of foot make _5_ stationary circles or pumps, then either on top of toes or each individual toe do _5_ pumps. Then retrace all steps pumping back up both sides of lower leg, outer thigh, and then pathway. Finish with what you started with, _5_ circles at involved side arm pit. All _2-3_ times at each sequence. Do _1__ time per day. "

## 2024-06-26 ENCOUNTER — Inpatient Hospital Stay: Admitting: Gynecologic Oncology

## 2024-06-26 ENCOUNTER — Inpatient Hospital Stay: Attending: Gynecologic Oncology | Admitting: Psychiatry

## 2024-06-26 ENCOUNTER — Ambulatory Visit: Admitting: Physical Therapy

## 2024-06-26 DIAGNOSIS — R1031 Right lower quadrant pain: Secondary | ICD-10-CM | POA: Diagnosis not present

## 2024-06-26 DIAGNOSIS — C549 Malignant neoplasm of corpus uteri, unspecified: Secondary | ICD-10-CM | POA: Diagnosis not present

## 2024-06-26 DIAGNOSIS — R61 Generalized hyperhidrosis: Secondary | ICD-10-CM | POA: Diagnosis not present

## 2024-06-26 DIAGNOSIS — R590 Localized enlarged lymph nodes: Secondary | ICD-10-CM | POA: Diagnosis not present

## 2024-06-26 DIAGNOSIS — G5781 Other specified mononeuropathies of right lower limb: Secondary | ICD-10-CM

## 2024-06-26 DIAGNOSIS — R600 Localized edema: Secondary | ICD-10-CM | POA: Diagnosis not present

## 2024-06-26 NOTE — H&P (View-Only) (Signed)
 Gynecologic Oncology Telehealth Follow-up Note  I connected with Lisa  D Zavala on 07/03/2024 at  4:45 PM EST by telephone and verified that I am speaking with the correct person using two identifiers.  I discussed the limitations, risks, security and privacy concerns of performing an evaluation and management service by telemedicine and the availability of in-person appointments. I also discussed with the patient that there may be a patient responsible charge related to this service. The patient expressed understanding and agreed to proceed.  Other persons participating in the visit and their role in the encounter: none.  Patient's location: home Provider's location: [facility]  Date of Service: 06/26/2024 Referring Provider: Marie-Lyne Lavoie, MD  [facility]  Assessment & Plan: [name] is a 73 y.o. 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 Now with imaging findings suggestive of isolated recurrence to the left pelvic sidewall lymph nodes. Presents for treatment discussion.  Gyn malignancy: - Completed adjuvant chemo followed by maintenance pembro, d/c'ed due to IRAE diabetes. - CT a/p on 05/09/24 completed due to pelvic symptoms. Noted a left external iliac lymph node enlarged in size. Subsequent PET with avidity in this location suggestive of isolated recurrence. - Tumor board discussion reviewed. - Plan for surgical excision for treatment and pathologic confirmation of recurrence. Discussed that systemic therapy will likely be discussed  following surgery. Can send tumor for tumor origin testing as well as HER2 testing given that original site of disease not clearly known and slight variance in next lines of therapy if this were uterine in origin versus ovary/tube.  - Prior molecular testing/NGS:  >> TMB high, HRD neg, MSS  >> Mutations: ERBB2 amplification equivocal, ARID1A, NF2, TP53  >> FOLR1 neg  Reviewed surgical plan for robotic assisted laparoscopic removal of involved pelvic lymph nodes and any other tumor debulking if encountered.  Patient was consented for: robotic-assisted laparoscopic pelvic lymph node debulking on 07/04/24 (originally scheduled for 1/13/6 but move up due to opening).  The risks of surgery were discussed in detail and she understands these to including but not limited to bleeding requiring a blood transfusion, infection, injury to adjacent organs (including but not limited to the bowels, bladder, ureters, nerves, blood vessels), thromboembolic events, wound separation, hernia, possible risk of lymphedema and lymphocyst if lymphadenectomy performed, unforseen complication, possible need for re-exploration, and medical complications such as heart attack, stroke, pneumonia.  If the patient experiences any of these events, she understands that her hospitalization or recovery may be prolonged and that she may need to take additional medications for a prolonged period. The patient will receive DVT and antibiotic prophylaxis as indicated. She voiced a clear understanding. She had the opportunity to ask questions and informed consent was obtained today. She wishes to proceed.   She does not require preoperative clearance. Her METs are >4.  All preoperative instructions were reviewed. Postoperative expectations were also reviewed. Written handouts were provided to the patient.  Patient works and would like to return to work when [employer]. Discussed likely return to work 2-4 weeks postop but duration can be  determined by patient's symptoms/desires. Will not require  restrictions for extended duration.   Right groin pain, obturator nerve injury and repair: - Improved with resumption of PT, right leg sleeve - Continue gabapentin . Taking 200mg  nightly. Does not feel the need to add daytime doses at this time. 300mg  made her too groggy.   Lymphedema/lower extremity edema: -Stable - PT as above  Hotflashes/nightsweats/poor sleep - Using gabapentin  200 mg nightly which seems to help some.  Also tylenol  PM. Continue. - Pt was started on vivelle  dot 0.05mg /24hr by Dr. Lonn (tumor ER neg). Has self discontinued.   T1DM - Currently on lantus  9u qAM/4u at bedtime and Novolog  4-6u TID. - Adjust meds preop per anesthesia.    RTC postop  [name], MD Gynecologic Oncology   Medical Decision Making I personally spent  TOTAL 25 minutes via phone in the care of this patient.     ----------------------- Reason for Visit: Surveillance  Treatment History: Oncology History Overview Note  Er neg, MSI stable, PD-L1 80% No genetic testing done, did not meet criteria   Uterine cancer (HCC)  10/23/2022 Imaging   1. 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 2. Multiple prominent palpable lymph nodes in the right groin measuring up to 2.1 cm, nonspecific. 3. Small right popliteal fossa Baker's cyst.   10/25/2022 Imaging   Ct imaging 1. Pelvic lymphadenopathy with the most prominent lymph node along the right iliac chain measuring 5.6 x 2.8 cm. Metastatic lymphadenopathy or lymphoproliferative disease cannot be excluded.  2. 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 3. Prominent right inguinal lymph nodes. 4. 8 mm sclerotic bone lesion within the right ischium, indeterminate. 5. Aortic  atherosclerosis.   Aortic Atherosclerosis (ICD10-I70.0).       11/11/2022 Pathology Results   SURGICAL PATHOLOGY CASE: [id number] PATIENT: Lisa  Zavala Surgical Pathology Report  Specimen Submitted: A. Lymph node, right pelvic  Clinical History: Pelvic lymphadenopathy  DIAGNOSIS: A. LYMPH NODE, RIGHT PELVIC; CT-GUIDED CORE NEEDLE BIOPSY: - METASTATIC POORLY DIFFERENTIATED CARCINOMA, SEE COMMENT.  Comment: Immunohistochemical studies show tumor cells to be positive for CK7, MOC-31, GATA-3, CK5/6, and calretinin (subset). P16 is strongly and diffusely positive. P53 demonstrates an absence of staining within tumor cells, indicative of a null staining pattern. PAX-8, WT-1, D240, TTF-1, cdx-2, and p40 are negative. The pattern of immunohistochemical staining is non-specific. Based on the morphologic features and pattern of immunohistochemical staining the differential diagnosis includes metastatic urothelial carcinoma, metastatic breast carcinoma, and metastatic serous carcinoma of gyn origin. Correlation with radiographic findings is required.  There is sufficient tissue present for ancillary molecular testing.  IHC slides were prepared by [facility], [place], [place]. All controls stained appropriately.   11/16/2022 PET scan   1. Examination is positive for tracer avid adenopathy within the retroperitoneum, bilateral pelvis and right inguinal region. Findings are compatible with nodal metastasis. Primary neoplasm is not apparent on the current exam.  2. No signs of solid organ metastasis within the abdomen or pelvis. 3. No signs of tracer avid disease above the diaphragm. 4. Asymmetric subcutaneous edema is identified involving the visualized portions of the right lower extremity. 5. 5 mm perifissural nodule in the left mid lung is too small to characterize by PET-CT. 6. Increased uptake within both lobes of thyroid  gland, right greater than  left. Correlation with thyroid  function tests advised. 7.  Aortic  Atherosclerosis (ICD10-I70.0).   11/19/2022 Initial Diagnosis   Gynecologic malignancy (HCC)   11/20/2022 Cancer Staging   Staging form: Corpus Uteri - Carcinoma and Carcinosarcoma, AJCC 8th Edition - Clinical stage from 11/20/2022: FIGO Stage IVB (cTX, cN2a, pM1) - Signed by [name] Hicks, MD on 11/20/2022 Stage prefix: Initial diagnosis   12/02/2022 Procedure   Ultrasound and fluoroscopically guided right internal jugular single lumen power port catheter insertion. Tip in the SVC/RA junction. Catheter ready for use.   12/04/2022 - 12/04/2022 Chemotherapy   Patient is on Treatment Plan : UTERINE Carboplatin  AUC 6 + Paclitaxel  q21d     12/04/2022 - 10/19/2023 Chemotherapy   Patient is on Treatment Plan : UTERINE Pembrolizumab  (200), Paclitaxel  (175), Carboplatin  (5) q21d x 6 cycles / Pembrolizumab  (400) q42d     12/15/2022 Imaging   1. No adnexal mass. No uterine mass lesion evident and there is no substantial thickening of the endometrium by MRI. Low signal intensity fibrous stroma of the cervix appears preserved although high signal intensity of the cervical mucosa is somewhat prominent. This could be correlated with Pap smear as clinically warranted. 2. Stable right pelvic sidewall and groin lymphadenopathy. 3. Small Bartholin's cysts bilaterally.   02/05/2023 Imaging   CT CHEST ABDOMEN PELVIS W CONTRAST  Result Date: 02/05/2023 CLINICAL DATA:  History of endometrial cancer, monitor. High-risk. * Tracking Code: BO * EXAM: CT CHEST, ABDOMEN, AND PELVIS WITH CONTRAST TECHNIQUE: Multidetector CT imaging of the chest, abdomen and pelvis was performed following the standard protocol during bolus administration of intravenous contrast. RADIATION DOSE REDUCTION: This exam was performed according to the departmental dose-optimization program which includes automated exposure control, adjustment of the mA and/or kV according to patient size and/or  use of iterative reconstruction technique. CONTRAST:  OMNIPAQUE  IOHEXOL  300 MG/ML  SOLN COMPARISON:  Multiple priors including MRI pelvis December 15, 2022 PET-CT Nov 16, 2022 and CT abdomen pelvis October 25, 2022. FINDINGS: CT CHEST FINDINGS Cardiovascular: Right chest wall Port-A-Cath with the tip in the right atrium. Aortic atherosclerosis. Normal caliber thoracic aorta. No central pulmonary embolus on this nondedicated study. Normal size heart. No significant pericardial effusion/thickening Mediastinum/Nodes: No suspicious thyroid  nodule. No pathologically enlarged mediastinal, hilar or axillary lymph nodes the esophagus is grossly unremarkable. Lungs/Pleura: Stable scattered tiny pulmonary nodules. For instance: -2 mm right upper lobe pulmonary nodule on image 29/3, unchanged -2 mm pulmonary nodule along the right major fissure on image 73/3, unchanged -4 mm pulmonary nodule along the left major fissure on image 73/3, unchanged. No pleural effusion.  No pneumothorax. Musculoskeletal: No suspicious chest wall lesion. No aggressive lytic or blastic lesion of bone. CT ABDOMEN PELVIS FINDINGS Hepatobiliary: Diffuse hepatic steatosis. Gallbladder is unremarkable. No biliary ductal dilation Pancreas: No pancreatic ductal dilation or evidence of acute inflammation Spleen: No splenomegaly. Adrenals/Urinary Tract: Bilateral adrenal glands appear normal. No hydronephrosis. Kidneys demonstrate symmetric enhancement. No suspicious renal mass. Urinary bladder is not well evaluated due to underdistention and streak artifact from left hip arthroplasty. Stomach/Bowel: No radiopaque enteric contrast material was administered. Hyperdensity along the posterior aspect of the stomach on image 53/2 appears similar on delayed imaging and is compatible with ingested material. No pathologic dilation of large or small bowel. No evidence of acute bowel inflammation. Vascular/Lymphatic: Normal caliber abdominal aorta. Aortic  atherosclerosis. The portal, splenic and superior mesenteric veins are patent. Decreased size of the retroperitoneal, bilateral pelvic sidewall/iliac side chain and right inguinal lymph nodes. For reference: -Retrocaval lymph node measures 6 mm in short axis on  image 69/2 previously 11 mm -Right sidewall lymph node measures 1.5 cm in short axis on image 110/2 previously 2.9 cm Reproductive: Evaluation of the pelvic reproductive structures is limited by streak artifact from left hip arthroplasty. Other: No significant abdominopelvic free fluid. No discrete peritoneal or omental nodularity. Musculoskeletal: No aggressive lytic or blastic lesion of bone. Multilevel degenerative changes spine. Left total hip arthroplasty. Degenerative change of the right hip. IMPRESSION: 1. Decreased size of the retroperitoneal, bilateral pelvic sidewall/iliac side chain and right inguinal lymph nodes, compatible with treatment response. 2. Stable scattered tiny pulmonary nodules, nonspecific but favored benign. Suggest continued attention on follow-up imaging 3. No evidence of new or progressive metastatic disease within the chest, abdomen or pelvis. 4. Diffuse hepatic steatosis. 5.  Aortic Atherosclerosis (ICD10-I70.0). Electronically Signed   By: Reyes Holder M.D.   On: 02/05/2023 15:03   XR Wrist Complete Right  Result Date: 01/13/2023 Right wrist  3 views: Right wrist distal radius fracture remains unchanged in overall position alignment.  Fracture is healed.  No acute findings otherwise.     02/16/2023 Pathology Results    SURGICAL PATHOLOGY CASE: [id number] PATIENT: Ayvah  Blick Surgical Pathology Report  Clinical History: poorly differentiated carcinoma of likely GYN origin  FINAL MICROSCOPIC DIAGNOSIS:  A. UTERUS, CERVIX, BILATERAL FALLOPIAN TUBES, AND OVARIES, RESECTION: Cervix     Nabothian cysts     Negative for dysplasia or malignancy Endometrium     Atrophic     Negative for atypia,  hyperplasia or malignancy Myometrium     Unremarkable with focal vascular calcifications     Negative for malignancy Right ovary     Focal paraovarian, inactive endometriosis     Negative for malignancy Left ovary     Unremarkable     Negative for endometriosis or malignancy Right fallopian tube     Not identified in entirely submitted right adnexa Left fallopian tube     Not identified in entirely submitted left adnexa  B. RIGHT PELVIC LYMPH NODE, EXCISION: Lymph node with hyalin fibrosis, focal necrosis and dystrophic calcifications Focal perinodal fat necrosis Negative for residual, viable carcinoma See comment  C. SEGMENT OF OBTURATOR NERVE, EXCISION: Benign nerve Negative for malignancy  D. OMENTUM: Benign adipose tissue Negative for malignancy  COMMENT: B. The right pelvic lymph node is entirely submitted and shows large areas of hyalin fibrosis focally associated with necrosis and dystrophic calcifications.  The findings are consistent with tumor regression although no residual, viable carcinoma is identified.     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 No palpable right lymphadenopathy. On entry to abdomen, normal upper abdominal survey with smooth diaphragm, liver, stomach and normal appearing omentum and bowel. Nodule in the gallbladder fossa (image in media). In the pelvis, filmy adhesions of the sigmoid colon to the left pelvic sidewall. Otherwise small, normal appearing uterus and bilateral fallopian  tubes and ovaries. Right pelvic sidewall with enlarged lymph node, approximately 4x2cm, densely adherent to the pelvic sidewall, external iliac vein. Densely adherent and encasing the obturator nerve and artery. With careful dissection, unavoidable avulsion injury occurred to the obturator artery. Due to the  densely adherent nature of the lymph node, the nerve and artery were initially inseparable. In obtaining control of the bleeding from the obturator artery, unavoidable crush and cautery injury to the obturator nerve. Following removal of the involved node, the affected portion of the obturator nerve was resected and well reapproximated. No other gross evidence of disease in the abdomen or pelvis.    05/21/2023 Imaging   CT CHEST ABDOMEN PELVIS W CONTRAST  Result Date: 05/21/2023 CLINICAL DATA:  History of endometrial cancer, monitor. * Tracking Code: BO *. EXAM: CT CHEST, ABDOMEN, AND PELVIS WITH CONTRAST TECHNIQUE: Multidetector CT imaging of the chest, abdomen and pelvis was performed following the standard protocol during bolus administration of intravenous contrast. RADIATION DOSE REDUCTION: This exam was performed according to the departmental dose-optimization program which includes automated exposure control, adjustment of the mA and/or kV according to patient size and/or use of iterative reconstruction technique. CONTRAST:  OMNIPAQUE  IOHEXOL  300 MG/ML  SOLN COMPARISON:  Multiple priors including most recent CT February 05, 2023. FINDINGS: CT CHEST FINDINGS Cardiovascular: Accessed right chest Port-A-Cath with tip in the right atrium. Aortic atherosclerosis. No central pulmonary embolus on this nondedicated study. Normal size heart. No significant pericardial effusion/thickening. Mediastinum/Nodes: No suspicious thyroid  nodule. No pathologically enlarged mediastinal, hilar or axillary lymph nodes. The esophagus is grossly unremarkable. Lungs/Pleura: Stable scattered tiny pulmonary nodules. No new  suspicious pulmonary nodules or masses. For reference: -right upper lobe pulmonary nodule measuring 2 mm on image 27/302 -pulmonary nodule along the right major fissure measuring 2 mm on image 66/302 -pulmonary nodule along the left major fissure measuring 4 mm on image 63/302. Musculoskeletal: No aggressive lytic or blastic lesion of bone. CT ABDOMEN PELVIS FINDINGS Hepatobiliary: No suspicious hepatic lesion. Gallbladder is unremarkable. No biliary ductal dilation. Pancreas: No pancreatic ductal dilation or evidence of acute inflammation. Spleen: No splenomegaly. Adrenals/Urinary Tract: Bilateral adrenal glands appear normal. No hydronephrosis. Kidneys demonstrate symmetric enhancement. Urinary bladder not well evaluated due to streak artifact from left hip arthroplasty. Stomach/Bowel: No radiopaque enteric contrast material administered. Stomach is unremarkable for degree of distension. No pathologic dilation of small or large bowel. No evidence of acute bowel inflammation. Vascular/Lymphatic: Normal caliber abdominal aorta. Aortic atherosclerosis. The portal, splenic and superior mesenteric veins are patent. Decreased size of the retroperitoneal, pelvic and inguinal lymph nodes although evaluation is limited by streak artifact from left hip arthroplasty. For reference: -retrocaval lymph node measures 6 mm on image 70/301 previously 11 mm -right pelvic sidewall lymph node measures 13 mm in short axis on image 103/301 previously 15 mm. -right inguinal lymph node measures 7 mm in short axis on image 121/301 previously 9 mm Reproductive: Evaluation of the pelvic structures is limited by streak artifact from left hip arthroplasty. Other: No significant abdominopelvic free fluid. No discrete peritoneal or omental nodularity. Musculoskeletal: No aggressive lytic or blastic lesion of bone. Left total hip arthroplasty. Similar sclerotic foci in the left iliac bone and right acetabulum favored to reflect bone islands.  IMPRESSION: 1. Decreased size of the retroperitoneal pelvic and inguinal lymph nodes. 2. Stable scattered tiny pulmonary nodules, favored benign. 3. No evidence of new or progressive disease in the chest, abdomen or pelvis. 4.  Aortic Atherosclerosis (ICD10-I70.0). Electronically Signed   By: Reyes Holder M.D.   On: 05/21/2023 16:26      10/31/2023 Imaging   Mild pericolonic inflammatory stranding surrounding the hepatic flexure of the colon possibly reflecting a mild infectious or inflammatory colitis. No evidence of obstruction or perforation.  01/28/2024 Imaging   CT CHEST ABDOMEN PELVIS W CONTRAST Result Date: 02/07/2024 EXAM: CT CHEST, ABDOMEN AND PELVIS WITH CONTRAST 01/28/2024 03:42:33 PM TECHNIQUE: CT of the chest, abdomen and pelvis was performed with the administration of intravenous contrast. Multiplanar reformatted images are provided for review. Automated exposure control, iterative reconstruction, and/or weight based adjustment of the mA/kV was utilized to reduce the radiation dose to as low as reasonably achievable. COMPARISON: CTAB 10/31/2023 and CT chest, abdomen, and pelvis 05/21/2023. CLINICAL HISTORY: Staging uterine cancer. Malignant neoplasm of uterus. FINDINGS: CHEST: MEDIASTINUM: Mild cardiac enlargement. No enlarged mediastinum or hilar lymph nodes. THORACIC LYMPH NODES: No mediastinal, hilar or axillary lymphadenopathy. LUNGS AND PLEURA: Stable subpleural nodule within the lung superior segment of the left lower lobe abutting the oblique fissure measuring 6 mm, image 68/302. Several additional less than 5 mm lung nodules are also unchanged in the interval. no new or suspicious lung nodules. No pleural effusion or pneumothorax. ABDOMEN AND PELVIS: LIVER: The liver is unremarkable. GALLBLADDER AND BILE DUCTS: Gallbladder is unremarkable. No biliary ductal dilatation. SPLEEN: No acute abnormality. PANCREAS: No acute abnormality. ADRENAL GLANDS: No acute abnormality. KIDNEYS, URETERS AND  BLADDER: No stones in the kidneys or ureters. No hydronephrosis. No perinephric or periureteral stranding. Urinary bladder is unremarkable. GI AND BOWEL: No pathologic dilatation of the bowel loops. Mild diffuse stool burden noted within the colon. There is no bowel obstruction. REPRODUCTIVE ORGANS: Uterus appears surgically absent. No adnexal mass identified. PERITONEUM AND RETROPERITONEUM: No ascites. No peritoneal nodule or mass identified. VASCULATURE: Aortic atherosclerotic calcification. ABDOMINAL AND PELVIS LYMPH NODES: No enlarged lymph nodes within the abdomen. The left pelvic sidewall lymph node is unchanged, measuring 9 mm (image 104/301). 1 cm right inguinal lymph node is stable, image 121/3. REPRODUCTIVE ORGANS: Uterus appears surgically absent. No adnexal mass identified. BONES AND SOFT TISSUES: Previous left hip arthroplasty. Stable sclerotic lesion within the right ischium measuring 7 mm, favored to represent a benign bone island, image 118/301. Small bone island within the left iliac bone is also unchanged, image 95/301. No acute or suspicious osseous lesions. No focal soft tissue abnormality. IMPRESSION: 1. No evidence of metastatic disease in the chest, abdomen, or pelvis. 2. Stable subpleural nodule within the lung superior segment of the left lower lobe abutting the oblique fissure measuring 6 mm, with several additional less than 5 mm lung nodules unchanged in the interval. No new or suspicious lung nodules. Electronically signed by: Waddell Calk MD 02/07/2024 05:29 AM EDT RP Workstation: [id number]   US  THYROID  Result Date: 01/22/2024 CLINICAL DATA:  Prior ultrasound follow-up. History of isthmic thyroid  nodule seen in 2023. EXAM: THYROID  ULTRASOUND TECHNIQUE: Ultrasound examination of the thyroid  gland and adjacent soft tissues was performed. COMPARISON:  Prior thyroid  ultrasound 12/16/2021 FINDINGS: Parenchymal Echotexture: Markedly heterogenous Isthmus: 0.3 cm Right lobe: 3.3 x 0.8 x  0.9 cm Left lobe: 2.3 x 0.6 x 0.8 cm _________________________________________________________ Estimated total number of nodules >/= 1 cm: 0 Number of spongiform nodules >/=  2 cm not described below (TR1): 0 Number of mixed cystic and solid nodules >/= 1.5 cm not described below (TR2): 0 _________________________________________________________ Small and diffusely heterogeneous thyroid  gland. No discrete thyroid  nodules are identified. The previously noted isthmic nodule is no longer visible and appears to have resolved. IMPRESSION: 1. Interval resolution of previously identified nodule in the thyroid  isthmus. No thyroid  nodules are present on today's exam. 2. Small/atrophied and diffusely heterogeneous thyroid  gland. The above is in keeping with the ACR TI-RADS recommendations - J  Am Coll Radiol 2017;14:587-595. Electronically Signed   By: Wilkie Lent M.D.   On: 01/22/2024 09:38      05/23/2024 PET scan   NM PET Image Restage (PS) Skull Base to Thigh (F-18 FDG) Result Date: 05/30/2024 EXAM: PET AND CT SKULL BASE TO MID THIGH 05/23/2024 12:40:16 PM TECHNIQUE: RADIOPHARMACEUTICAL: 6.4 mCi F-18 FDG Uptake time 60 minutes. Glucose level 81 mg/dl. Blood pool SUV 2.8. PET imaging was acquired from the base of the skull to the mid thighs. Non-contrast enhanced computed tomography was obtained for attenuation correction and anatomic localization. COMPARISON: 05/09/2024 abdominopelvic CT. Prior PET of 11/16/2022. CLINICAL HISTORY: Metastatic disease evaluation; Per Dr. 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 FINDINGS: HEAD AND NECK: No hypermetabolic cervical lymph nodes are identified. CHEST: There are no hypermetabolic mediastinal, hilar or axillary lymph nodes. No hypermetabolic pulmonary activity or suspicious nodularity. Port a cath tip at high right atrium. Thoracic aortic atherosclerosis. Nodule on the left major fissure of 5 mm is similar and likely a  subpleural lymph node on image 35 / 7. ABDOMEN AND PELVIS: There is no hypermetabolic activity within the liver, adrenal glands, spleen or pancreas. The left pelvic sidewall node of interest on the recent diagnostic CT is hypermetabolic. Example at 2.1 x 1.3 cm and SUV 9.0 on image 162 / 3. Compare 1.4 cm and SUV 6.6 on the prior PET. The other hypermetabolic pelvic adenopathy has resolved. No residual abdominal retroperitoneum nodal hypermetabolism. Abdominal aortic atherosclerosis. Right perimidline small fat containing ventral abdominal wall hernia. Beam hardening artifact degrees evaluation of the pelvis secondary to left hip arthroplasty. Hysterectomy. BONES AND SOFT TISSUE: There is no hypermetabolic activity to suggest osseous metastatic disease. Right ischial sclerotic lesion is likely a bone island and present on the prior PET. IMPRESSION: 1. Isolated left pelvic sidewall hypermetabolic adenopathy, confirming recurrent/progressive disease since 01/28/24. 2. When compared to the most recent PET of 11/16/22, response to therapy throughout the remainder of the abdomen and pelvis. 3. Aortic atherosclerosis (ICD-10 I70.0). Electronically signed by: Rockey Kilts MD 05/30/2024 04:05 PM EST RP Workstation: [id number]   CT ABDOMEN PELVIS W CONTRAST Result Date: 05/11/2024 EXAM: CT ABDOMEN AND PELVIS WITH CONTRAST 05/09/2024 01:53:52 PM TECHNIQUE: CT of the abdomen and pelvis was performed with the administration of 75 mL of iohexol  (OMNIPAQUE ) 300 MG/ML solution. Multiplanar reformatted images are provided for review. Automated exposure control, iterative reconstruction, and/or weight-based adjustment of the mA/kV was utilized to reduce the radiation dose to as low as reasonably achievable. COMPARISON: CT Abdomen Pelvis with contrast 10/31/2023. CLINICAL HISTORY: Abdominal pain, acute, nonlocalized; Genital cancer, monitor. * Tracking Code: BO * FINDINGS: LOWER CHEST: Normal heart size. Clear lung bases. LIVER:  Normal, without mass or intrahepatic biliary duct dilatation. GALLBLADDER AND BILE DUCTS: Gallbladder is unremarkable. No biliary ductal dilatation. SPLEEN: Normal in size and morphology. PANCREAS: Normal, without duct dilatation or acute inflammation. ADRENAL GLANDS: No acute abnormality. KIDNEYS, URETERS AND BLADDER: No renal mass or hydronephrosis. Normal urinary bladder. Mildly degraded evaluation of the bladder secondary to beam hardening artifact from left hip arthroplasty. GI AND BOWEL: Normal stomach, without wall thickening. Normal small bowel caliber. Colonic stool burden suggests constipation. Appendix not visualized. No right lower quadrant inflammation. PERITONEUM AND RETROPERITONEUM: No ascites. No free air. Left hip arthroplasty. VASCULATURE: Aorta is normal in caliber. Aortic atherosclerosis. LYMPH NODES: No abdominal adenopathy. Bilateral inguinal nodes of up to 1.0 cm are similar and likely reactive. A left external iliac node measures  12 x 19 mm on image 61/301. this area is obscured by artifact on the prior estimated at 9 x 16 mm on that exam. REPRODUCTIVE ORGANS: No acute abnormality. BONES AND SOFT TISSUES: Presumed bone island within the right ischium at 8 mm, similar. Degenerative disc disease at the Lumbosacral junction. Trace L4-L5 anterolisthesis. No acute osseous abnormality. No focal soft tissue abnormality. IMPRESSION: 1. Status post hysterectomy. Mild left external iliac adenopathy, new or increased since the prior exam; early nodal metastasis cannot be excluded. Consider PET versus CT follow-up at 3 months. 2. Aortic atherosclerosis. 3. Colonic stool burden suggests constipation. 4. Mild degradation secondary to beam hardening artifact from left hip arthroplasty. Electronically signed by: Rockey Kilts MD 05/11/2024 06:12 PM EST RP Workstation: [id number]        Interval History: Pt reports that since her last visit she went back to PT which has helped a lot. She is also now  using a sleeve on her leg. She continues with gabapentin  at night and tylenol  PM which helps with sleep. Feels like her leg is back to normal and is more mobile with pain improved. Has used miralax  once a week twice with improvement in her bowels and no more diarrhea.   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   Top of Left Hand(Tx p Bx)    Past Surgical History:  Procedure Laterality Date   CARPAL TUNNEL RELEASE     BILAT     CATARACT EXTRACTION     BILAT    ECTOPIC PREGNANCY SURGERY     in the setting of prior BTL, lapartomy   EYE SURGERY     IR IMAGING GUIDED PORT INSERTION  12/02/2022   KNEE CARTILAGE SURGERY     RIGHT    (MENISCUS)   LAPAROTOMY N/A 02/16/2023   Procedure: MINI LAPAROTOMY;  Surgeon: Eldonna Mays, MD;  Location: [facility];  Service: Gynecology;  Laterality: N/A;   LYMPH NODE DISSECTION Right 02/16/2023   Procedure: RIGHT PELVIC LYMPH NODE DISSECTION;  Surgeon: Eldonna Mays, MD;  Location: [facility];  Service: Gynecology;  Laterality: Right;   MOHS SURGERY     left nose/inner eye for basal cell   TONSILLECTOMY     TOTAL HIP ARTHROPLASTY Left 12/19/2015   TOTAL HIP ARTHROPLASTY Left 12/19/2015   Procedure: LEFT TOTAL HIP ARTHROPLASTY;  Surgeon: Maude LELON Right, MD;  Location: [facility];  Service: Orthopedics;  Laterality: Left;   TRIGGER FINGER RELEASE     TUBAL LIGATION      Family History  Problem Relation Age of Onset    Uterine cancer Mother 31   Heart attack Mother    Hypertension Mother    Kidney cancer Father 24   Lung cancer Father    Diabetes Sister    Thyroid  disease Neg Hx    Colon cancer Neg Hx  Breast cancer Neg Hx    Ovarian cancer Neg Hx    Endometrial cancer Neg Hx    Pancreatic cancer Neg Hx    Prostate cancer Neg Hx     Social History   Socioeconomic History   Marital status: Married    Spouse name: Not on file   Number of children: Not on file   Years of education: Not on file   Highest education level: Not on file  Occupational History   Not on file  Tobacco Use   Smoking status: Never   Smokeless tobacco: Never  Vaping Use   Vaping status: Never Used  Substance and Sexual Activity   Alcohol  use: Yes    Alcohol /week: 0.0 standard drinks of alcohol     Comment: Rare   Drug use: No   Sexual activity: Yes    Birth control/protection: Surgical, Post-menopausal    Comment: Tubal lig-1st intercourse 73 yo-Fewer than 5 partners  Other Topics Concern   Not on file  Social History Narrative   Not on file   Social Drivers of Health   Tobacco Use: Low Risk (06/21/2024)   Patient History    Smoking Tobacco Use: Never    Smokeless Tobacco Use: Never    Passive Exposure: Not on file  Financial Resource Strain: Not on file  Food Insecurity: Low Risk (12/20/2023)   Received from [facility]   Epic    Within the past 12 months, you worried that your food would run out before you got money to buy more: Never true    Within the past 12 months, the food you bought just didn't last and you didn't have money to get more. : Never true  Transportation Needs: No Transportation Needs (12/20/2023)   Received from [facility]    In the past 12 months, has lack of reliable transportation kept you from medical appointments, meetings, work or from getting things needed for daily living? : No  Physical Activity: Not on file  Stress: Not on file  Social Connections:  Socially Integrated (10/31/2023)   Social Connection and Isolation Panel    Frequency of Communication with Friends and Family: More than three times a week    Frequency of Social Gatherings with Friends and Family: Three times a week    Attends Religious Services: More than 4 times per year    Active Member of Clubs or Organizations: Yes    Attends Banker Meetings: More than 4 times per year    Marital Status: Married  Depression (PHQ2-9): Low Risk (10/30/2022)   Depression (PHQ2-9)    PHQ-2 Score: 0  Alcohol  Screen: Not on file  Housing: Low Risk (12/20/2023)   Received from [facility]   Epic    What is your living situation today?: I have a steady place to live    Think about the place you live. Do you have problems with any of the following? Choose all that apply:: None/None on this list  Utilities: Low Risk (12/20/2023)   Received from [facility]   Utilities    In the past 12 months has the electric, gas, oil, or water  company threatened to shut off services in your home? : No  Health Literacy: Not on file    Current Medications:  Current Outpatient Medications:    acetaminophen  (TYLENOL ) 500 MG tablet, Take 500-1,000 mg by mouth every 6 (six) hours as needed (for muscle pain or soreness)., Disp: , Rfl:    aspirin   EC 81 MG tablet, Take 81 mg by mouth at bedtime. Swallow whole., Disp: , Rfl:    Blood Glucose Monitoring Suppl (BLOOD GLUCOSE MONITOR SYSTEM) w/Device KIT, Use to check blood sugar three times daily, Disp: 1 kit, Rfl: 0   calcium carbonate (TUMS) 500 MG chewable tablet, Chew 1 tablet by mouth daily., Disp: , Rfl:    Carboxymethylcellulose Sodium (THERATEARS OP), Place 1 drop into both eyes 3 (three) times daily., Disp: , Rfl:    Cholecalciferol (VITAMIN D ) 50 MCG (2000 UT) tablet, Take 2,000 Units by mouth daily., Disp: , Rfl:    Continuous Glucose Sensor (FREESTYLE LIBRE 3 PLUS SENSOR) MISC, Inject 1 Device into the skin continuous. Change every 15  days, Disp: 6 each, Rfl: 3   diclofenac  sodium (VOLTAREN ) 1 % GEL, Apply 2-4 g topically 4 (four) times daily., Disp: 3 Tube, Rfl: 3   DULoxetine  (CYMBALTA ) 30 MG capsule, Take 1 capsule (30 mg total) by mouth daily., Disp: 30 capsule, Rfl: 3   gabapentin  (NEURONTIN ) 100 MG capsule, Take 100-200 mg by mouth daily as needed (for hot flashes)., Disp: , Rfl:    Glucose Blood (BLOOD GLUCOSE TEST STRIPS) STRP, Use to check blood sugar three times daily, Disp: 100 strip, Rfl: 0   insulin  aspart (NOVOLOG ) 100 UNIT/ML FlexPen, Inject 4-8 Units into the skin 3 (three) times daily with meals., Disp: 30 mL, Rfl: 3   insulin  glargine (LANTUS ) 100 UNIT/ML Solostar Pen, Inject 20 Units into the skin daily., Disp: 15 mL, Rfl: 3   Insulin  Pen Needle (PEN NEEDLES) 31G X 5 MM MISC, Use to inject insulin  as directed, Disp: 300 each, Rfl: 5   Lancet Device MISC, 1 each by Does not apply route 3 (three) times daily. May dispense any manufacturer covered by patient's insurance., Disp: 1 each, Rfl: 0   Lancets MISC, Use to check blood sugar three times daily, Disp: 100 each, Rfl: 0   levothyroxine  (SYNTHROID ) 125 MCG tablet, Take 1 tablet (125 mcg total) by mouth daily before breakfast., Disp: 90 tablet, Rfl: 3   lidocaine -prilocaine  (EMLA ) cream, Apply 1 Application topically as needed., Disp: 30 g, Rfl: 3   melatonin 5 MG TABS, Take 5 mg by mouth at bedtime as needed (sleep)., Disp: , Rfl:    meloxicam  (MOBIC ) 7.5 MG tablet, Take 1 tablet (7.5 mg total) by mouth daily. (Patient taking differently: Take 7.5 mg by mouth daily. Only as needed), Disp: 30 tablet, Rfl: 1   Multiple Vitamin (MULTIVITAMIN) tablet, Take 1 tablet by mouth daily with breakfast., Disp: , Rfl:    rosuvastatin (CRESTOR) 5 MG tablet, Take 5 mg by mouth., Disp: , Rfl:   Review of Symptoms: Complete 10-system review is positive for: none  Physical Exam: Deferred given limitations of phone visit.    Laboratory & Radiologic Studies: NM PET Image  Restage (PS) Skull Base to Thigh (F-18 FDG) 05/23/2024  Narrative EXAM: PET AND CT SKULL BASE TO MID THIGH 05/23/2024 12:40:16 PM  TECHNIQUE:  RADIOPHARMACEUTICAL: 6.4 mCi F-18 FDG Uptake time 60 minutes. Glucose level 81 mg/dl. Blood pool SUV 2.8.  PET imaging was acquired from the base of the skull to the mid thighs. Non-contrast enhanced computed tomography was obtained for attenuation correction and anatomic localization.  COMPARISON: 05/09/2024 abdominopelvic CT. Prior PET of 11/16/2022.  CLINICAL HISTORY: Metastatic disease evaluation; Per Dr. 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  FINDINGS:  HEAD AND NECK: No hypermetabolic cervical lymph nodes are identified.  CHEST: There are no hypermetabolic mediastinal, hilar or axillary lymph nodes. No hypermetabolic pulmonary activity or suspicious nodularity. Port a cath tip at high right atrium. Thoracic aortic atherosclerosis. Nodule on the left major fissure of 5 mm is similar and likely a subpleural lymph node on image 35 / 7.  ABDOMEN AND PELVIS: There is no hypermetabolic activity within the liver, adrenal glands, spleen or pancreas. The left pelvic sidewall node of interest on the recent diagnostic CT is hypermetabolic. Example at 2.1 x 1.3 cm and SUV 9.0 on image 162 / 3. Compare 1.4 cm and SUV 6.6 on the prior PET. The other hypermetabolic pelvic adenopathy has resolved. No residual abdominal retroperitoneum nodal hypermetabolism. Abdominal aortic atherosclerosis. Right perimidline small fat containing ventral abdominal wall hernia. Beam hardening artifact degrees evaluation of the pelvis secondary to left hip arthroplasty. Hysterectomy.  BONES AND SOFT TISSUE: There is no hypermetabolic activity to suggest osseous metastatic disease. Right ischial sclerotic lesion is likely a bone island and present on the prior PET.  IMPRESSION: 1. Isolated left  pelvic sidewall hypermetabolic adenopathy, confirming recurrent/progressive disease since 01/28/24. 2. When compared to the most recent PET of 11/16/22, response to therapy throughout the remainder of the abdomen and pelvis. 3. Aortic atherosclerosis (ICD-10 I70.0).  Electronically signed by: Rockey Kilts MD 05/30/2024 04:05 PM EST RP Workstation: [id number]   CT ABDOMEN PELVIS W CONTRAST 05/09/2024  Narrative EXAM: CT ABDOMEN AND PELVIS WITH CONTRAST 05/09/2024 01:53:52 PM  TECHNIQUE: CT of the abdomen and pelvis was performed with the administration of 75 mL of iohexol  (OMNIPAQUE ) 300 MG/ML solution. Multiplanar reformatted images are provided for review. Automated exposure control, iterative reconstruction, and/or weight-based adjustment of the mA/kV was utilized to reduce the radiation dose to as low as reasonably achievable.  COMPARISON: CT Abdomen Pelvis with contrast 10/31/2023.  CLINICAL HISTORY: Abdominal pain, acute, nonlocalized; Genital cancer, monitor. * Tracking Code: BO *  FINDINGS:  LOWER CHEST: Normal heart size. Clear lung bases.  LIVER: Normal, without mass or intrahepatic biliary duct dilatation.  GALLBLADDER AND BILE DUCTS: Gallbladder is unremarkable. No biliary ductal dilatation.  SPLEEN: Normal in size and morphology.  PANCREAS: Normal, without duct dilatation or acute inflammation.  ADRENAL GLANDS: No acute abnormality.  KIDNEYS, URETERS AND BLADDER: No renal mass or hydronephrosis. Normal urinary bladder. Mildly degraded evaluation of the bladder secondary to beam hardening artifact from left hip arthroplasty.  GI AND BOWEL: Normal stomach, without wall thickening. Normal small bowel caliber. Colonic stool burden suggests constipation. Appendix not visualized. No right lower quadrant inflammation.  PERITONEUM AND RETROPERITONEUM: No ascites. No free air. Left hip arthroplasty.  VASCULATURE: Aorta is normal in caliber. Aortic  atherosclerosis.  LYMPH NODES: No abdominal adenopathy. Bilateral inguinal nodes of up to 1.0 cm are similar and likely reactive. A left external iliac node measures 12 x 19 mm on image 61/301. this area is obscured by artifact on the prior estimated at 9 x 16 mm on that exam.  REPRODUCTIVE ORGANS: No acute abnormality.  BONES AND SOFT TISSUES: Presumed bone island within the right ischium at 8 mm, similar. Degenerative disc disease at the Lumbosacral junction. Trace L4-L5 anterolisthesis. No acute osseous abnormality. No focal soft tissue abnormality.  IMPRESSION: 1. Status post hysterectomy. Mild left external iliac adenopathy, new or increased since the prior exam; early nodal metastasis cannot be excluded. Consider PET versus CT follow-up at 3 months. 2. Aortic atherosclerosis. 3. Colonic stool burden suggests constipation. 4. Mild degradation secondary to beam hardening artifact from left hip  arthroplasty.  Electronically signed by: Rockey Kilts MD 05/11/2024 06:12 PM EST RP Workstation: [id number]

## 2024-06-26 NOTE — Progress Notes (Signed)
 Gynecologic Oncology Telehealth Follow-up Note  I connected with [name] on 07/03/2024 at  4:45 PM EST by telephone and verified that I am speaking with the correct person using two identifiers.  I discussed the limitations, risks, security and privacy concerns of performing an evaluation and management service by telemedicine and the availability of in-person appointments. I also discussed with the patient that there may be a patient responsible charge related to this service. The patient expressed understanding and agreed to proceed.  Other persons participating in the visit and their role in the encounter: none.  Patient's location: home Provider's location: [facility]  Date of Service: 06/26/2024 Referring Provider: Marie-Lyne Lavoie, MD  [facility]  Assessment & Plan: [name] is a 73 y.o. 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 Now with imaging findings suggestive of isolated recurrence to the left pelvic sidewall lymph nodes. Presents for treatment discussion.  Gyn malignancy: - Completed adjuvant chemo followed by maintenance pembro, d/c'ed due to IRAE diabetes. - CT a/p on 05/09/24 completed due to pelvic symptoms. Noted a left external iliac lymph node enlarged in size. Subsequent PET with avidity in this location suggestive of isolated recurrence. - Tumor board discussion reviewed. - Plan for surgical excision for treatment and pathologic confirmation of recurrence. Discussed that systemic therapy will likely be discussed  following surgery. Can send tumor for tumor origin testing as well as HER2 testing given that original site of disease not clearly known and slight variance in next lines of therapy if this were uterine in origin versus ovary/tube.  - Prior molecular testing/NGS:  >> TMB high, HRD neg, MSS  >> Mutations: ERBB2 amplification equivocal, ARID1A, NF2, TP53  >> FOLR1 neg  Reviewed surgical plan for robotic assisted laparoscopic removal of involved pelvic lymph nodes and any other tumor debulking if encountered.  Patient was consented for: robotic-assisted laparoscopic pelvic lymph node debulking on 07/04/24 (originally scheduled for 1/13/6 but move up due to opening).  The risks of surgery were discussed in detail and she understands these to including but not limited to bleeding requiring a blood transfusion, infection, injury to adjacent organs (including but not limited to the bowels, bladder, ureters, nerves, blood vessels), thromboembolic events, wound separation, hernia, possible risk of lymphedema and lymphocyst if lymphadenectomy performed, unforseen complication, possible need for re-exploration, and medical complications such as heart attack, stroke, pneumonia.  If the patient experiences any of these events, she understands that her hospitalization or recovery may be prolonged and that she may need to take additional medications for a prolonged period. The patient will receive DVT and antibiotic prophylaxis as indicated. She voiced a clear understanding. She had the opportunity to ask questions and informed consent was obtained today. She wishes to proceed.   She does not require preoperative clearance. Her METs are >4.  All preoperative instructions were reviewed. Postoperative expectations were also reviewed. Written handouts were provided to the patient.  Patient works and would like to return to work when [employer]. Discussed likely return to work 2-4 weeks postop but duration can be  determined by patient's symptoms/desires. Will not require  restrictions for extended duration.   Right groin pain, obturator nerve injury and repair: - Improved with resumption of PT, right leg sleeve - Continue gabapentin . Taking 200mg  nightly. Does not feel the need to add daytime doses at this time. 300mg  made her too groggy.   Lymphedema/lower extremity edema: -Stable - PT as above  Hotflashes/nightsweats/poor sleep - Using gabapentin  200 mg nightly which seems to help some.  Also tylenol  PM. Continue. - Pt was started on vivelle  dot 0.05mg /24hr by Dr. Lonn (tumor ER neg). Has self discontinued.   T1DM - Currently on lantus  9u qAM/4u at bedtime and Novolog  4-6u TID. - Adjust meds preop per anesthesia.    RTC postop  [name], MD Gynecologic Oncology   Medical Decision Making I personally spent  TOTAL 25 minutes via phone in the care of this patient.     ----------------------- Reason for Visit: Surveillance  Treatment History: Oncology History Overview Note  Er neg, MSI stable, PD-L1 80% No genetic testing done, did not meet criteria   Uterine cancer (HCC)  10/23/2022 Imaging   1. 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 2. Multiple prominent palpable lymph nodes in the right groin measuring up to 2.1 cm, nonspecific. 3. Small right popliteal fossa Baker's cyst.   10/25/2022 Imaging   Ct imaging 1. Pelvic lymphadenopathy with the most prominent lymph node along the right iliac chain measuring 5.6 x 2.8 cm. Metastatic lymphadenopathy or lymphoproliferative disease cannot be excluded.  2. 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 3. Prominent right inguinal lymph nodes. 4. 8 mm sclerotic bone lesion within the right ischium, indeterminate. 5. Aortic  atherosclerosis.   Aortic Atherosclerosis (ICD10-I70.0).       11/11/2022 Pathology Results   SURGICAL PATHOLOGY CASE: [id number] PATIENT: [name] Surgical Pathology Report  Specimen Submitted: A. Lymph node, right pelvic  Clinical History: Pelvic lymphadenopathy  DIAGNOSIS: A. LYMPH NODE, RIGHT PELVIC; CT-GUIDED CORE NEEDLE BIOPSY: - METASTATIC POORLY DIFFERENTIATED CARCINOMA, SEE COMMENT.  Comment: Immunohistochemical studies show tumor cells to be positive for CK7, MOC-31, GATA-3, CK5/6, and calretinin (subset). P16 is strongly and diffusely positive. P53 demonstrates an absence of staining within tumor cells, indicative of a null staining pattern. PAX-8, WT-1, D240, TTF-1, cdx-2, and p40 are negative. The pattern of immunohistochemical staining is non-specific. Based on the morphologic features and pattern of immunohistochemical staining the differential diagnosis includes metastatic urothelial carcinoma, metastatic breast carcinoma, and metastatic serous carcinoma of gyn origin. Correlation with radiographic findings is required.  There is sufficient tissue present for ancillary molecular testing.  IHC slides were prepared by [facility], [place], [place]. All controls stained appropriately.   11/16/2022 PET scan   1. Examination is positive for tracer avid adenopathy within the retroperitoneum, bilateral pelvis and right inguinal region. Findings are compatible with nodal metastasis. Primary neoplasm is not apparent on the current exam.  2. No signs of solid organ metastasis within the abdomen or pelvis. 3. No signs of tracer avid disease above the diaphragm. 4. Asymmetric subcutaneous edema is identified involving the visualized portions of the right lower extremity. 5. 5 mm perifissural nodule in the left mid lung is too small to characterize by PET-CT. 6. Increased uptake within both lobes of thyroid  gland, right greater than  left. Correlation with thyroid  function tests advised. 7.  Aortic  Atherosclerosis (ICD10-I70.0).   11/19/2022 Initial Diagnosis   Gynecologic malignancy (HCC)   11/20/2022 Cancer Staging   Staging form: Corpus Uteri - Carcinoma and Carcinosarcoma, AJCC 8th Edition - Clinical stage from 11/20/2022: FIGO Stage IVB (cTX, cN2a, pM1) - Signed by Lonn Hicks, MD on 11/20/2022 Stage prefix: Initial diagnosis   12/02/2022 Procedure   Ultrasound and fluoroscopically guided right internal jugular single lumen power port catheter insertion. Tip in the SVC/RA junction. Catheter ready for use.   12/04/2022 - 12/04/2022 Chemotherapy   Patient is on Treatment Plan : UTERINE Carboplatin  AUC 6 + Paclitaxel  q21d     12/04/2022 - 10/19/2023 Chemotherapy   Patient is on Treatment Plan : UTERINE Pembrolizumab  (200), Paclitaxel  (175), Carboplatin  (5) q21d x 6 cycles / Pembrolizumab  (400) q42d     12/15/2022 Imaging   1. No adnexal mass. No uterine mass lesion evident and there is no substantial thickening of the endometrium by MRI. Low signal intensity fibrous stroma of the cervix appears preserved although high signal intensity of the cervical mucosa is somewhat prominent. This could be correlated with Pap smear as clinically warranted. 2. Stable right pelvic sidewall and groin lymphadenopathy. 3. Small Bartholin's cysts bilaterally.   02/05/2023 Imaging   CT CHEST ABDOMEN PELVIS W CONTRAST  Result Date: 02/05/2023 CLINICAL DATA:  History of endometrial cancer, monitor. High-risk. * Tracking Code: BO * EXAM: CT CHEST, ABDOMEN, AND PELVIS WITH CONTRAST TECHNIQUE: Multidetector CT imaging of the chest, abdomen and pelvis was performed following the standard protocol during bolus administration of intravenous contrast. RADIATION DOSE REDUCTION: This exam was performed according to the departmental dose-optimization program which includes automated exposure control, adjustment of the mA and/or kV according to patient size and/or  use of iterative reconstruction technique. CONTRAST:  OMNIPAQUE  IOHEXOL  300 MG/ML  SOLN COMPARISON:  Multiple priors including MRI pelvis December 15, 2022 PET-CT Nov 16, 2022 and CT abdomen pelvis October 25, 2022. FINDINGS: CT CHEST FINDINGS Cardiovascular: Right chest wall Port-A-Cath with the tip in the right atrium. Aortic atherosclerosis. Normal caliber thoracic aorta. No central pulmonary embolus on this nondedicated study. Normal size heart. No significant pericardial effusion/thickening Mediastinum/Nodes: No suspicious thyroid  nodule. No pathologically enlarged mediastinal, hilar or axillary lymph nodes the esophagus is grossly unremarkable. Lungs/Pleura: Stable scattered tiny pulmonary nodules. For instance: -2 mm right upper lobe pulmonary nodule on image 29/3, unchanged -2 mm pulmonary nodule along the right major fissure on image 73/3, unchanged -4 mm pulmonary nodule along the left major fissure on image 73/3, unchanged. No pleural effusion.  No pneumothorax. Musculoskeletal: No suspicious chest wall lesion. No aggressive lytic or blastic lesion of bone. CT ABDOMEN PELVIS FINDINGS Hepatobiliary: Diffuse hepatic steatosis. Gallbladder is unremarkable. No biliary ductal dilation Pancreas: No pancreatic ductal dilation or evidence of acute inflammation Spleen: No splenomegaly. Adrenals/Urinary Tract: Bilateral adrenal glands appear normal. No hydronephrosis. Kidneys demonstrate symmetric enhancement. No suspicious renal mass. Urinary bladder is not well evaluated due to underdistention and streak artifact from left hip arthroplasty. Stomach/Bowel: No radiopaque enteric contrast material was administered. Hyperdensity along the posterior aspect of the stomach on image 53/2 appears similar on delayed imaging and is compatible with ingested material. No pathologic dilation of large or small bowel. No evidence of acute bowel inflammation. Vascular/Lymphatic: Normal caliber abdominal aorta. Aortic  atherosclerosis. The portal, splenic and superior mesenteric veins are patent. Decreased size of the retroperitoneal, bilateral pelvic sidewall/iliac side chain and right inguinal lymph nodes. For reference: -Retrocaval lymph node measures 6 mm in short axis on  image 69/2 previously 11 mm -Right sidewall lymph node measures 1.5 cm in short axis on image 110/2 previously 2.9 cm Reproductive: Evaluation of the pelvic reproductive structures is limited by streak artifact from left hip arthroplasty. Other: No significant abdominopelvic free fluid. No discrete peritoneal or omental nodularity. Musculoskeletal: No aggressive lytic or blastic lesion of bone. Multilevel degenerative changes spine. Left total hip arthroplasty. Degenerative change of the right hip. IMPRESSION: 1. Decreased size of the retroperitoneal, bilateral pelvic sidewall/iliac side chain and right inguinal lymph nodes, compatible with treatment response. 2. Stable scattered tiny pulmonary nodules, nonspecific but favored benign. Suggest continued attention on follow-up imaging 3. No evidence of new or progressive metastatic disease within the chest, abdomen or pelvis. 4. Diffuse hepatic steatosis. 5.  Aortic Atherosclerosis (ICD10-I70.0). Electronically Signed   By: Reyes Holder M.D.   On: 02/05/2023 15:03   XR Wrist Complete Right  Result Date: 01/13/2023 Right wrist  3 views: Right wrist distal radius fracture remains unchanged in overall position alignment.  Fracture is healed.  No acute findings otherwise.     02/16/2023 Pathology Results    SURGICAL PATHOLOGY CASE: [id number] PATIENT: [name] Surgical Pathology Report  Clinical History: poorly differentiated carcinoma of likely GYN origin  FINAL MICROSCOPIC DIAGNOSIS:  A. UTERUS, CERVIX, BILATERAL FALLOPIAN TUBES, AND OVARIES, RESECTION: Cervix     Nabothian cysts     Negative for dysplasia or malignancy Endometrium     Atrophic     Negative for atypia,  hyperplasia or malignancy Myometrium     Unremarkable with focal vascular calcifications     Negative for malignancy Right ovary     Focal paraovarian, inactive endometriosis     Negative for malignancy Left ovary     Unremarkable     Negative for endometriosis or malignancy Right fallopian tube     Not identified in entirely submitted right adnexa Left fallopian tube     Not identified in entirely submitted left adnexa  B. RIGHT PELVIC LYMPH NODE, EXCISION: Lymph node with hyalin fibrosis, focal necrosis and dystrophic calcifications Focal perinodal fat necrosis Negative for residual, viable carcinoma See comment  C. SEGMENT OF OBTURATOR NERVE, EXCISION: Benign nerve Negative for malignancy  D. OMENTUM: Benign adipose tissue Negative for malignancy  COMMENT: B. The right pelvic lymph node is entirely submitted and shows large areas of hyalin fibrosis focally associated with necrosis and dystrophic calcifications.  The findings are consistent with tumor regression although no residual, viable carcinoma is identified.     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 No palpable right lymphadenopathy. On entry to abdomen, normal upper abdominal survey with smooth diaphragm, liver, stomach and normal appearing omentum and bowel. Nodule in the gallbladder fossa (image in media). In the pelvis, filmy adhesions of the sigmoid colon to the left pelvic sidewall. Otherwise small, normal appearing uterus and bilateral fallopian  tubes and ovaries. Right pelvic sidewall with enlarged lymph node, approximately 4x2cm, densely adherent to the pelvic sidewall, external iliac vein. Densely adherent and encasing the obturator nerve and artery. With careful dissection, unavoidable avulsion injury occurred to the obturator artery. Due to the  densely adherent nature of the lymph node, the nerve and artery were initially inseparable. In obtaining control of the bleeding from the obturator artery, unavoidable crush and cautery injury to the obturator nerve. Following removal of the involved node, the affected portion of the obturator nerve was resected and well reapproximated. No other gross evidence of disease in the abdomen or pelvis.    05/21/2023 Imaging   CT CHEST ABDOMEN PELVIS W CONTRAST  Result Date: 05/21/2023 CLINICAL DATA:  History of endometrial cancer, monitor. * Tracking Code: BO *. EXAM: CT CHEST, ABDOMEN, AND PELVIS WITH CONTRAST TECHNIQUE: Multidetector CT imaging of the chest, abdomen and pelvis was performed following the standard protocol during bolus administration of intravenous contrast. RADIATION DOSE REDUCTION: This exam was performed according to the departmental dose-optimization program which includes automated exposure control, adjustment of the mA and/or kV according to patient size and/or use of iterative reconstruction technique. CONTRAST:  OMNIPAQUE  IOHEXOL  300 MG/ML  SOLN COMPARISON:  Multiple priors including most recent CT February 05, 2023. FINDINGS: CT CHEST FINDINGS Cardiovascular: Accessed right chest Port-A-Cath with tip in the right atrium. Aortic atherosclerosis. No central pulmonary embolus on this nondedicated study. Normal size heart. No significant pericardial effusion/thickening. Mediastinum/Nodes: No suspicious thyroid  nodule. No pathologically enlarged mediastinal, hilar or axillary lymph nodes. The esophagus is grossly unremarkable. Lungs/Pleura: Stable scattered tiny pulmonary nodules. No new  suspicious pulmonary nodules or masses. For reference: -right upper lobe pulmonary nodule measuring 2 mm on image 27/302 -pulmonary nodule along the right major fissure measuring 2 mm on image 66/302 -pulmonary nodule along the left major fissure measuring 4 mm on image 63/302. Musculoskeletal: No aggressive lytic or blastic lesion of bone. CT ABDOMEN PELVIS FINDINGS Hepatobiliary: No suspicious hepatic lesion. Gallbladder is unremarkable. No biliary ductal dilation. Pancreas: No pancreatic ductal dilation or evidence of acute inflammation. Spleen: No splenomegaly. Adrenals/Urinary Tract: Bilateral adrenal glands appear normal. No hydronephrosis. Kidneys demonstrate symmetric enhancement. Urinary bladder not well evaluated due to streak artifact from left hip arthroplasty. Stomach/Bowel: No radiopaque enteric contrast material administered. Stomach is unremarkable for degree of distension. No pathologic dilation of small or large bowel. No evidence of acute bowel inflammation. Vascular/Lymphatic: Normal caliber abdominal aorta. Aortic atherosclerosis. The portal, splenic and superior mesenteric veins are patent. Decreased size of the retroperitoneal, pelvic and inguinal lymph nodes although evaluation is limited by streak artifact from left hip arthroplasty. For reference: -retrocaval lymph node measures 6 mm on image 70/301 previously 11 mm -right pelvic sidewall lymph node measures 13 mm in short axis on image 103/301 previously 15 mm. -right inguinal lymph node measures 7 mm in short axis on image 121/301 previously 9 mm Reproductive: Evaluation of the pelvic structures is limited by streak artifact from left hip arthroplasty. Other: No significant abdominopelvic free fluid. No discrete peritoneal or omental nodularity. Musculoskeletal: No aggressive lytic or blastic lesion of bone. Left total hip arthroplasty. Similar sclerotic foci in the left iliac bone and right acetabulum favored to reflect bone islands.  IMPRESSION: 1. Decreased size of the retroperitoneal pelvic and inguinal lymph nodes. 2. Stable scattered tiny pulmonary nodules, favored benign. 3. No evidence of new or progressive disease in the chest, abdomen or pelvis. 4.  Aortic Atherosclerosis (ICD10-I70.0). Electronically Signed   By: Reyes Holder M.D.   On: 05/21/2023 16:26      10/31/2023 Imaging   Mild pericolonic inflammatory stranding surrounding the hepatic flexure of the colon possibly reflecting a mild infectious or inflammatory colitis. No evidence of obstruction or perforation.  01/28/2024 Imaging   CT CHEST ABDOMEN PELVIS W CONTRAST Result Date: 02/07/2024 EXAM: CT CHEST, ABDOMEN AND PELVIS WITH CONTRAST 01/28/2024 03:42:33 PM TECHNIQUE: CT of the chest, abdomen and pelvis was performed with the administration of intravenous contrast. Multiplanar reformatted images are provided for review. Automated exposure control, iterative reconstruction, and/or weight based adjustment of the mA/kV was utilized to reduce the radiation dose to as low as reasonably achievable. COMPARISON: CTAB 10/31/2023 and CT chest, abdomen, and pelvis 05/21/2023. CLINICAL HISTORY: Staging uterine cancer. Malignant neoplasm of uterus. FINDINGS: CHEST: MEDIASTINUM: Mild cardiac enlargement. No enlarged mediastinum or hilar lymph nodes. THORACIC LYMPH NODES: No mediastinal, hilar or axillary lymphadenopathy. LUNGS AND PLEURA: Stable subpleural nodule within the lung superior segment of the left lower lobe abutting the oblique fissure measuring 6 mm, image 68/302. Several additional less than 5 mm lung nodules are also unchanged in the interval. no new or suspicious lung nodules. No pleural effusion or pneumothorax. ABDOMEN AND PELVIS: LIVER: The liver is unremarkable. GALLBLADDER AND BILE DUCTS: Gallbladder is unremarkable. No biliary ductal dilatation. SPLEEN: No acute abnormality. PANCREAS: No acute abnormality. ADRENAL GLANDS: No acute abnormality. KIDNEYS, URETERS AND  BLADDER: No stones in the kidneys or ureters. No hydronephrosis. No perinephric or periureteral stranding. Urinary bladder is unremarkable. GI AND BOWEL: No pathologic dilatation of the bowel loops. Mild diffuse stool burden noted within the colon. There is no bowel obstruction. REPRODUCTIVE ORGANS: Uterus appears surgically absent. No adnexal mass identified. PERITONEUM AND RETROPERITONEUM: No ascites. No peritoneal nodule or mass identified. VASCULATURE: Aortic atherosclerotic calcification. ABDOMINAL AND PELVIS LYMPH NODES: No enlarged lymph nodes within the abdomen. The left pelvic sidewall lymph node is unchanged, measuring 9 mm (image 104/301). 1 cm right inguinal lymph node is stable, image 121/3. REPRODUCTIVE ORGANS: Uterus appears surgically absent. No adnexal mass identified. BONES AND SOFT TISSUES: Previous left hip arthroplasty. Stable sclerotic lesion within the right ischium measuring 7 mm, favored to represent a benign bone island, image 118/301. Small bone island within the left iliac bone is also unchanged, image 95/301. No acute or suspicious osseous lesions. No focal soft tissue abnormality. IMPRESSION: 1. No evidence of metastatic disease in the chest, abdomen, or pelvis. 2. Stable subpleural nodule within the lung superior segment of the left lower lobe abutting the oblique fissure measuring 6 mm, with several additional less than 5 mm lung nodules unchanged in the interval. No new or suspicious lung nodules. Electronically signed by: Waddell Calk MD 02/07/2024 05:29 AM EDT RP Workstation: [id number]   US  THYROID  Result Date: 01/22/2024 CLINICAL DATA:  Prior ultrasound follow-up. History of isthmic thyroid  nodule seen in 2023. EXAM: THYROID  ULTRASOUND TECHNIQUE: Ultrasound examination of the thyroid  gland and adjacent soft tissues was performed. COMPARISON:  Prior thyroid  ultrasound 12/16/2021 FINDINGS: Parenchymal Echotexture: Markedly heterogenous Isthmus: 0.3 cm Right lobe: 3.3 x 0.8 x  0.9 cm Left lobe: 2.3 x 0.6 x 0.8 cm _________________________________________________________ Estimated total number of nodules >/= 1 cm: 0 Number of spongiform nodules >/=  2 cm not described below (TR1): 0 Number of mixed cystic and solid nodules >/= 1.5 cm not described below (TR2): 0 _________________________________________________________ Small and diffusely heterogeneous thyroid  gland. No discrete thyroid  nodules are identified. The previously noted isthmic nodule is no longer visible and appears to have resolved. IMPRESSION: 1. Interval resolution of previously identified nodule in the thyroid  isthmus. No thyroid  nodules are present on today's exam. 2. Small/atrophied and diffusely heterogeneous thyroid  gland. The above is in keeping with the ACR TI-RADS recommendations - J  Am Coll Radiol 2017;14:587-595. Electronically Signed   By: Wilkie Lent M.D.   On: 01/22/2024 09:38      05/23/2024 PET scan   NM PET Image Restage (PS) Skull Base to Thigh (F-18 FDG) Result Date: 05/30/2024 EXAM: PET AND CT SKULL BASE TO MID THIGH 05/23/2024 12:40:16 PM TECHNIQUE: RADIOPHARMACEUTICAL: 6.4 mCi F-18 FDG Uptake time 60 minutes. Glucose level 81 mg/dl. Blood pool SUV 2.8. PET imaging was acquired from the base of the skull to the mid thighs. Non-contrast enhanced computed tomography was obtained for attenuation correction and anatomic localization. COMPARISON: 05/09/2024 abdominopelvic CT. Prior PET of 11/16/2022. CLINICAL HISTORY: Metastatic disease evaluation; Per Dr. 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 FINDINGS: HEAD AND NECK: No hypermetabolic cervical lymph nodes are identified. CHEST: There are no hypermetabolic mediastinal, hilar or axillary lymph nodes. No hypermetabolic pulmonary activity or suspicious nodularity. Port a cath tip at high right atrium. Thoracic aortic atherosclerosis. Nodule on the left major fissure of 5 mm is similar and likely a  subpleural lymph node on image 35 / 7. ABDOMEN AND PELVIS: There is no hypermetabolic activity within the liver, adrenal glands, spleen or pancreas. The left pelvic sidewall node of interest on the recent diagnostic CT is hypermetabolic. Example at 2.1 x 1.3 cm and SUV 9.0 on image 162 / 3. Compare 1.4 cm and SUV 6.6 on the prior PET. The other hypermetabolic pelvic adenopathy has resolved. No residual abdominal retroperitoneum nodal hypermetabolism. Abdominal aortic atherosclerosis. Right perimidline small fat containing ventral abdominal wall hernia. Beam hardening artifact degrees evaluation of the pelvis secondary to left hip arthroplasty. Hysterectomy. BONES AND SOFT TISSUE: There is no hypermetabolic activity to suggest osseous metastatic disease. Right ischial sclerotic lesion is likely a bone island and present on the prior PET. IMPRESSION: 1. Isolated left pelvic sidewall hypermetabolic adenopathy, confirming recurrent/progressive disease since 01/28/24. 2. When compared to the most recent PET of 11/16/22, response to therapy throughout the remainder of the abdomen and pelvis. 3. Aortic atherosclerosis (ICD-10 I70.0). Electronically signed by: Rockey Kilts MD 05/30/2024 04:05 PM EST RP Workstation: [id number]   CT ABDOMEN PELVIS W CONTRAST Result Date: 05/11/2024 EXAM: CT ABDOMEN AND PELVIS WITH CONTRAST 05/09/2024 01:53:52 PM TECHNIQUE: CT of the abdomen and pelvis was performed with the administration of 75 mL of iohexol  (OMNIPAQUE ) 300 MG/ML solution. Multiplanar reformatted images are provided for review. Automated exposure control, iterative reconstruction, and/or weight-based adjustment of the mA/kV was utilized to reduce the radiation dose to as low as reasonably achievable. COMPARISON: CT Abdomen Pelvis with contrast 10/31/2023. CLINICAL HISTORY: Abdominal pain, acute, nonlocalized; Genital cancer, monitor. * Tracking Code: BO * FINDINGS: LOWER CHEST: Normal heart size. Clear lung bases. LIVER:  Normal, without mass or intrahepatic biliary duct dilatation. GALLBLADDER AND BILE DUCTS: Gallbladder is unremarkable. No biliary ductal dilatation. SPLEEN: Normal in size and morphology. PANCREAS: Normal, without duct dilatation or acute inflammation. ADRENAL GLANDS: No acute abnormality. KIDNEYS, URETERS AND BLADDER: No renal mass or hydronephrosis. Normal urinary bladder. Mildly degraded evaluation of the bladder secondary to beam hardening artifact from left hip arthroplasty. GI AND BOWEL: Normal stomach, without wall thickening. Normal small bowel caliber. Colonic stool burden suggests constipation. Appendix not visualized. No right lower quadrant inflammation. PERITONEUM AND RETROPERITONEUM: No ascites. No free air. Left hip arthroplasty. VASCULATURE: Aorta is normal in caliber. Aortic atherosclerosis. LYMPH NODES: No abdominal adenopathy. Bilateral inguinal nodes of up to 1.0 cm are similar and likely reactive. A left external iliac node measures  12 x 19 mm on image 61/301. this area is obscured by artifact on the prior estimated at 9 x 16 mm on that exam. REPRODUCTIVE ORGANS: No acute abnormality. BONES AND SOFT TISSUES: Presumed bone island within the right ischium at 8 mm, similar. Degenerative disc disease at the Lumbosacral junction. Trace L4-L5 anterolisthesis. No acute osseous abnormality. No focal soft tissue abnormality. IMPRESSION: 1. Status post hysterectomy. Mild left external iliac adenopathy, new or increased since the prior exam; early nodal metastasis cannot be excluded. Consider PET versus CT follow-up at 3 months. 2. Aortic atherosclerosis. 3. Colonic stool burden suggests constipation. 4. Mild degradation secondary to beam hardening artifact from left hip arthroplasty. Electronically signed by: Rockey Kilts MD 05/11/2024 06:12 PM EST RP Workstation: [id number]        Interval History: Pt reports that since her last visit she went back to PT which has helped a lot. She is also now  using a sleeve on her leg. She continues with gabapentin  at night and tylenol  PM which helps with sleep. Feels like her leg is back to normal and is more mobile with pain improved. Has used miralax  once a week twice with improvement in her bowels and no more diarrhea.   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   Top of Left Hand(Tx p Bx)    Past Surgical History:  Procedure Laterality Date   CARPAL TUNNEL RELEASE     BILAT     CATARACT EXTRACTION     BILAT    ECTOPIC PREGNANCY SURGERY     in the setting of prior BTL, lapartomy   EYE SURGERY     IR IMAGING GUIDED PORT INSERTION  12/02/2022   KNEE CARTILAGE SURGERY     RIGHT    (MENISCUS)   LAPAROTOMY N/A 02/16/2023   Procedure: MINI LAPAROTOMY;  Surgeon: Eldonna Mays, MD;  Location: [facility];  Service: Gynecology;  Laterality: N/A;   LYMPH NODE DISSECTION Right 02/16/2023   Procedure: RIGHT PELVIC LYMPH NODE DISSECTION;  Surgeon: Eldonna Mays, MD;  Location: [facility];  Service: Gynecology;  Laterality: Right;   MOHS SURGERY     left nose/inner eye for basal cell   TONSILLECTOMY     TOTAL HIP ARTHROPLASTY Left 12/19/2015   TOTAL HIP ARTHROPLASTY Left 12/19/2015   Procedure: LEFT TOTAL HIP ARTHROPLASTY;  Surgeon: Maude LELON Right, MD;  Location: [facility];  Service: Orthopedics;  Laterality: Left;   TRIGGER FINGER RELEASE     TUBAL LIGATION      Family History  Problem Relation Age of Onset    Uterine cancer Mother 31   Heart attack Mother    Hypertension Mother    Kidney cancer Father 24   Lung cancer Father    Diabetes Sister    Thyroid  disease Neg Hx    Colon cancer Neg Hx  Breast cancer Neg Hx    Ovarian cancer Neg Hx    Endometrial cancer Neg Hx    Pancreatic cancer Neg Hx    Prostate cancer Neg Hx     Social History   Socioeconomic History   Marital status: Married    Spouse name: Not on file   Number of children: Not on file   Years of education: Not on file   Highest education level: Not on file  Occupational History   Not on file  Tobacco Use   Smoking status: Never   Smokeless tobacco: Never  Vaping Use   Vaping status: Never Used  Substance and Sexual Activity   Alcohol  use: Yes    Alcohol /week: 0.0 standard drinks of alcohol     Comment: Rare   Drug use: No   Sexual activity: Yes    Birth control/protection: Surgical, Post-menopausal    Comment: Tubal lig-1st intercourse 73 yo-Fewer than 5 partners  Other Topics Concern   Not on file  Social History Narrative   Not on file   Social Drivers of Health   Tobacco Use: Low Risk (06/21/2024)   Patient History    Smoking Tobacco Use: Never    Smokeless Tobacco Use: Never    Passive Exposure: Not on file  Financial Resource Strain: Not on file  Food Insecurity: Low Risk (12/20/2023)   Received from [facility]   Epic    Within the past 12 months, you worried that your food would run out before you got money to buy more: Never true    Within the past 12 months, the food you bought just didn't last and you didn't have money to get more. : Never true  Transportation Needs: No Transportation Needs (12/20/2023)   Received from [facility]    In the past 12 months, has lack of reliable transportation kept you from medical appointments, meetings, work or from getting things needed for daily living? : No  Physical Activity: Not on file  Stress: Not on file  Social Connections:  Socially Integrated (10/31/2023)   Social Connection and Isolation Panel    Frequency of Communication with Friends and Family: More than three times a week    Frequency of Social Gatherings with Friends and Family: Three times a week    Attends Religious Services: More than 4 times per year    Active Member of Clubs or Organizations: Yes    Attends Banker Meetings: More than 4 times per year    Marital Status: Married  Depression (PHQ2-9): Low Risk (10/30/2022)   Depression (PHQ2-9)    PHQ-2 Score: 0  Alcohol  Screen: Not on file  Housing: Low Risk (12/20/2023)   Received from [facility]   Epic    What is your living situation today?: I have a steady place to live    Think about the place you live. Do you have problems with any of the following? Choose all that apply:: None/None on this list  Utilities: Low Risk (12/20/2023)   Received from [facility]   Utilities    In the past 12 months has the electric, gas, oil, or water  company threatened to shut off services in your home? : No  Health Literacy: Not on file    Current Medications:  Current Outpatient Medications:    acetaminophen  (TYLENOL ) 500 MG tablet, Take 500-1,000 mg by mouth every 6 (six) hours as needed (for muscle pain or soreness)., Disp: , Rfl:    aspirin   EC 81 MG tablet, Take 81 mg by mouth at bedtime. Swallow whole., Disp: , Rfl:    Blood Glucose Monitoring Suppl (BLOOD GLUCOSE MONITOR SYSTEM) w/Device KIT, Use to check blood sugar three times daily, Disp: 1 kit, Rfl: 0   calcium carbonate (TUMS) 500 MG chewable tablet, Chew 1 tablet by mouth daily., Disp: , Rfl:    Carboxymethylcellulose Sodium (THERATEARS OP), Place 1 drop into both eyes 3 (three) times daily., Disp: , Rfl:    Cholecalciferol (VITAMIN D ) 50 MCG (2000 UT) tablet, Take 2,000 Units by mouth daily., Disp: , Rfl:    Continuous Glucose Sensor (FREESTYLE LIBRE 3 PLUS SENSOR) MISC, Inject 1 Device into the skin continuous. Change every 15  days, Disp: 6 each, Rfl: 3   diclofenac  sodium (VOLTAREN ) 1 % GEL, Apply 2-4 g topically 4 (four) times daily., Disp: 3 Tube, Rfl: 3   DULoxetine  (CYMBALTA ) 30 MG capsule, Take 1 capsule (30 mg total) by mouth daily., Disp: 30 capsule, Rfl: 3   gabapentin  (NEURONTIN ) 100 MG capsule, Take 100-200 mg by mouth daily as needed (for hot flashes)., Disp: , Rfl:    Glucose Blood (BLOOD GLUCOSE TEST STRIPS) STRP, Use to check blood sugar three times daily, Disp: 100 strip, Rfl: 0   insulin  aspart (NOVOLOG ) 100 UNIT/ML FlexPen, Inject 4-8 Units into the skin 3 (three) times daily with meals., Disp: 30 mL, Rfl: 3   insulin  glargine (LANTUS ) 100 UNIT/ML Solostar Pen, Inject 20 Units into the skin daily., Disp: 15 mL, Rfl: 3   Insulin  Pen Needle (PEN NEEDLES) 31G X 5 MM MISC, Use to inject insulin  as directed, Disp: 300 each, Rfl: 5   Lancet Device MISC, 1 each by Does not apply route 3 (three) times daily. May dispense any manufacturer covered by patient's insurance., Disp: 1 each, Rfl: 0   Lancets MISC, Use to check blood sugar three times daily, Disp: 100 each, Rfl: 0   levothyroxine  (SYNTHROID ) 125 MCG tablet, Take 1 tablet (125 mcg total) by mouth daily before breakfast., Disp: 90 tablet, Rfl: 3   lidocaine -prilocaine  (EMLA ) cream, Apply 1 Application topically as needed., Disp: 30 g, Rfl: 3   melatonin 5 MG TABS, Take 5 mg by mouth at bedtime as needed (sleep)., Disp: , Rfl:    meloxicam  (MOBIC ) 7.5 MG tablet, Take 1 tablet (7.5 mg total) by mouth daily. (Patient taking differently: Take 7.5 mg by mouth daily. Only as needed), Disp: 30 tablet, Rfl: 1   Multiple Vitamin (MULTIVITAMIN) tablet, Take 1 tablet by mouth daily with breakfast., Disp: , Rfl:    rosuvastatin (CRESTOR) 5 MG tablet, Take 5 mg by mouth., Disp: , Rfl:   Review of Symptoms: Complete 10-system review is positive for: none  Physical Exam: Deferred given limitations of phone visit.    Laboratory & Radiologic Studies: NM PET Image  Restage (PS) Skull Base to Thigh (F-18 FDG) 05/23/2024  Narrative EXAM: PET AND CT SKULL BASE TO MID THIGH 05/23/2024 12:40:16 PM  TECHNIQUE:  RADIOPHARMACEUTICAL: 6.4 mCi F-18 FDG Uptake time 60 minutes. Glucose level 81 mg/dl. Blood pool SUV 2.8.  PET imaging was acquired from the base of the skull to the mid thighs. Non-contrast enhanced computed tomography was obtained for attenuation correction and anatomic localization.  COMPARISON: 05/09/2024 abdominopelvic CT. Prior PET of 11/16/2022.  CLINICAL HISTORY: Metastatic disease evaluation; Per Dr. 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  FINDINGS:  HEAD AND NECK: No hypermetabolic cervical lymph nodes are identified.  CHEST: There are no hypermetabolic mediastinal, hilar or axillary lymph nodes. No hypermetabolic pulmonary activity or suspicious nodularity. Port a cath tip at high right atrium. Thoracic aortic atherosclerosis. Nodule on the left major fissure of 5 mm is similar and likely a subpleural lymph node on image 35 / 7.  ABDOMEN AND PELVIS: There is no hypermetabolic activity within the liver, adrenal glands, spleen or pancreas. The left pelvic sidewall node of interest on the recent diagnostic CT is hypermetabolic. Example at 2.1 x 1.3 cm and SUV 9.0 on image 162 / 3. Compare 1.4 cm and SUV 6.6 on the prior PET. The other hypermetabolic pelvic adenopathy has resolved. No residual abdominal retroperitoneum nodal hypermetabolism. Abdominal aortic atherosclerosis. Right perimidline small fat containing ventral abdominal wall hernia. Beam hardening artifact degrees evaluation of the pelvis secondary to left hip arthroplasty. Hysterectomy.  BONES AND SOFT TISSUE: There is no hypermetabolic activity to suggest osseous metastatic disease. Right ischial sclerotic lesion is likely a bone island and present on the prior PET.  IMPRESSION: 1. Isolated left  pelvic sidewall hypermetabolic adenopathy, confirming recurrent/progressive disease since 01/28/24. 2. When compared to the most recent PET of 11/16/22, response to therapy throughout the remainder of the abdomen and pelvis. 3. Aortic atherosclerosis (ICD-10 I70.0).  Electronically signed by: Rockey Kilts MD 05/30/2024 04:05 PM EST RP Workstation: [id number]   CT ABDOMEN PELVIS W CONTRAST 05/09/2024  Narrative EXAM: CT ABDOMEN AND PELVIS WITH CONTRAST 05/09/2024 01:53:52 PM  TECHNIQUE: CT of the abdomen and pelvis was performed with the administration of 75 mL of iohexol  (OMNIPAQUE ) 300 MG/ML solution. Multiplanar reformatted images are provided for review. Automated exposure control, iterative reconstruction, and/or weight-based adjustment of the mA/kV was utilized to reduce the radiation dose to as low as reasonably achievable.  COMPARISON: CT Abdomen Pelvis with contrast 10/31/2023.  CLINICAL HISTORY: Abdominal pain, acute, nonlocalized; Genital cancer, monitor. * Tracking Code: BO *  FINDINGS:  LOWER CHEST: Normal heart size. Clear lung bases.  LIVER: Normal, without mass or intrahepatic biliary duct dilatation.  GALLBLADDER AND BILE DUCTS: Gallbladder is unremarkable. No biliary ductal dilatation.  SPLEEN: Normal in size and morphology.  PANCREAS: Normal, without duct dilatation or acute inflammation.  ADRENAL GLANDS: No acute abnormality.  KIDNEYS, URETERS AND BLADDER: No renal mass or hydronephrosis. Normal urinary bladder. Mildly degraded evaluation of the bladder secondary to beam hardening artifact from left hip arthroplasty.  GI AND BOWEL: Normal stomach, without wall thickening. Normal small bowel caliber. Colonic stool burden suggests constipation. Appendix not visualized. No right lower quadrant inflammation.  PERITONEUM AND RETROPERITONEUM: No ascites. No free air. Left hip arthroplasty.  VASCULATURE: Aorta is normal in caliber. Aortic  atherosclerosis.  LYMPH NODES: No abdominal adenopathy. Bilateral inguinal nodes of up to 1.0 cm are similar and likely reactive. A left external iliac node measures 12 x 19 mm on image 61/301. this area is obscured by artifact on the prior estimated at 9 x 16 mm on that exam.  REPRODUCTIVE ORGANS: No acute abnormality.  BONES AND SOFT TISSUES: Presumed bone island within the right ischium at 8 mm, similar. Degenerative disc disease at the Lumbosacral junction. Trace L4-L5 anterolisthesis. No acute osseous abnormality. No focal soft tissue abnormality.  IMPRESSION: 1. Status post hysterectomy. Mild left external iliac adenopathy, new or increased since the prior exam; early nodal metastasis cannot be excluded. Consider PET versus CT follow-up at 3 months. 2. Aortic atherosclerosis. 3. Colonic stool burden suggests constipation. 4. Mild degradation secondary to beam hardening artifact from left hip  arthroplasty.  Electronically signed by: Rockey Kilts MD 05/11/2024 06:12 PM EST RP Workstation: [id number]

## 2024-06-27 ENCOUNTER — Ambulatory Visit: Admitting: Physical Therapy

## 2024-06-27 ENCOUNTER — Encounter: Payer: Self-pay | Admitting: Gynecologic Oncology

## 2024-06-28 ENCOUNTER — Inpatient Hospital Stay: Admitting: Gynecologic Oncology

## 2024-06-28 ENCOUNTER — Other Ambulatory Visit: Payer: Self-pay | Admitting: Gynecologic Oncology

## 2024-06-28 ENCOUNTER — Telehealth: Payer: Self-pay | Admitting: *Deleted

## 2024-06-28 DIAGNOSIS — C549 Malignant neoplasm of corpus uteri, unspecified: Secondary | ICD-10-CM

## 2024-06-28 MED ORDER — SENNOSIDES-DOCUSATE SODIUM 8.6-50 MG PO TABS: 2.0000 | ORAL_TABLET | Freq: Every day | ORAL | 0 refills | Status: DC

## 2024-06-28 NOTE — Patient Instructions (Signed)
 Preparing for your Surgery  Plan for surgery on July 11, 2024 with Dr. [name] at [facility]. You will be scheduled for a robotic assisted pelvic lymphadenectomy (removal of pelvic lymph node) and lymph node debulking.   Pre-operative Testing -You will receive a phone call from presurgical testing at [facility] to arrange for a pre-operative appointment and lab work.  -Bring your insurance card, copy of an advanced directive if applicable, medication list  -At that visit, you will be asked to sign a consent for a possible blood transfusion in case a transfusion becomes necessary during surgery.  The need for a blood transfusion is rare but having consent is a necessary part of your care.     -You can continue taking your baby aspirin  (81 mg) with the last dose being the morning on the day before surgery. DO NOT TAKE the morning of surgery.  -You will need to hold your mobic  for at least 10 days before surgery to decrease risk of bleeding.  -Do not take supplements such as fish oil (omega 3), red yeast rice, turmeric before your surgery. STOP TAKING AT LEAST 10 DAYS BEFORE SURGERY. You want to avoid medications with aspirin  in them including headache powders such as BC or Goody's), Excedrin migraine.  -If you are taking a GLP-1 medication/injection such as Ozempic, Mounjaro, [id number], this needs to be held before surgery for at least 7 days before.  Day Before Surgery at Home -You will be asked to take in a light diet the day before surgery. You will be advised you can have clear liquids up until 3 hours before your surgery.    Eat a light diet the day before surgery.  Examples including soups, broths, toast, yogurt, mashed potatoes.  AVOID GAS PRODUCING FOODS AND BEVERAGES. Things to avoid include carbonated beverages (fizzy beverages, sodas), raw fruits and raw vegetables (uncooked), or beans.   If your bowels are filled with gas, your surgeon will have  difficulty visualizing your pelvic organs which increases your surgical risks.  Your role in recovery Your role is to become active as soon as directed by your doctor, while still giving yourself time to heal.  Rest when you feel tired. You will be asked to do the following in order to speed your recovery:  - Cough and breathe deeply. This helps to clear and expand your lungs and can prevent pneumonia after surgery.  - STAY ACTIVE WHEN YOU GET HOME. Do mild physical activity. Walking or moving your legs help your circulation and body functions return to normal. Do not try to get up or walk alone the first time after surgery.   -If you develop swelling on one leg or the other, pain in the back of your leg, redness/warmth in one of your legs, please call the office or go to the Emergency Room to have a doppler to rule out a blood clot. For shortness of breath, chest pain-seek care in the Emergency Room as soon as possible. - Actively manage your pain. Managing your pain lets you move in comfort. We will ask you to rate your pain on a scale of zero to 10. It is your responsibility to tell your doctor or nurse where and how much you hurt so your pain can be treated.  Special Considerations -If you are diabetic, you may be placed on insulin  after surgery to have closer control over your blood sugars to promote healing and recovery.  This does not mean that you  will be discharged on insulin .  If applicable, your oral antidiabetics will be resumed when you are tolerating a solid diet.  -Your final pathology results from surgery should be available around one week after surgery and the results will be relayed to you when available.  -Dr. Olam Mill is the surgeon that assists your GYN Oncologist with surgery.  If you end up staying the night, the next day after your surgery you will either see Dr. Viktoria, Dr. Eldonna, or Dr. Olam Mill.  -FMLA forms can be faxed to [phone] and please  allow 5-7 business days for completion.  Pain Management After Surgery -You will be prescribed your pain medication and bowel regimen medications before surgery so that you can have these available when you are discharged from the hospital. The pain medication is for use ONLY AFTER surgery and a new prescription will not be given.   -Make sure that you have Tylenol  and Ibuprofen  (OR MOBIC , DO NOT TAKE WITH NSAIDS SUCH AS IBUPROFEN  SINCE THEY WORK SIMILARLY). IF YOU ARE ABLE TO TAKE THESE MEDICATIONS at home to use on a regular basis after surgery for pain control. We recommend alternating the medications every hour to six hours since they work differently and are processed in the body differently for pain relief.  -Review the attached handout on narcotic use and their risks and side effects.   Bowel Regimen -You will be prescribed Sennakot-S to take nightly to prevent constipation especially if you are taking the narcotic pain medication intermittently.  It is important to prevent constipation and drink adequate amounts of liquids. You can stop taking this medication when you are not taking pain medication and you are back on your normal bowel routine.  Risks of Surgery Risks of surgery are low but include bleeding, infection, damage to surrounding structures, re-operation, blood clots, and very rarely death.   Blood Transfusion Information (For the consent to be signed before surgery)  We will be checking your blood type before surgery so in case of emergencies, we will know what type of blood you would need.                                            WHAT IS A BLOOD TRANSFUSION?  A transfusion is the replacement of blood or some of its parts. Blood is made up of multiple cells which provide different functions. Red blood cells carry oxygen and are used for blood loss replacement. White blood cells fight against infection. Platelets control bleeding. Plasma helps clot blood. Other blood  products are available for specialized needs, such as hemophilia or other clotting disorders. BEFORE THE TRANSFUSION  Who gives blood for transfusions?  You may be able to donate blood to be used at a later date on yourself (autologous donation). Relatives can be asked to donate blood. This is generally not any safer than if you have received blood from a stranger. The same precautions are taken to ensure safety when a relative's blood is donated. Healthy volunteers who are fully evaluated to make sure their blood is safe. This is blood bank blood. Transfusion therapy is the safest it has ever been in the practice of medicine. Before blood is taken from a donor, a complete history is taken to make sure that person has no history of diseases nor engages in risky social behavior (examples are intravenous drug use or  sexual activity with multiple partners). The donor's travel history is screened to minimize risk of transmitting infections, such as malaria. The donated blood is tested for signs of infectious diseases, such as HIV and hepatitis. The blood is then tested to be sure it is compatible with you in order to minimize the chance of a transfusion reaction. If you or a relative donates blood, this is often done in anticipation of surgery and is not appropriate for emergency situations. It takes many days to process the donated blood. RISKS AND COMPLICATIONS Although transfusion therapy is very safe and saves many lives, the main dangers of transfusion include:  Getting an infectious disease. Developing a transfusion reaction. This is an allergic reaction to something in the blood you were given. Every precaution is taken to prevent this. The decision to have a blood transfusion has been considered carefully by your caregiver before blood is given. Blood is not given unless the benefits outweigh the risks.  AFTER SURGERY INSTRUCTIONS  Return to work: 4-6 weeks if applicable  Activity: 1. Be up and  out of the bed during the day.  Take a nap if needed.  You may walk up steps but be careful and use the hand rail.  Stair climbing will tire you more than you think, you may need to stop part way and rest.   2. No lifting or straining for 6 weeks over 10 pounds. No pushing, pulling, straining for 6 weeks.  3. No driving for 4-89 days when the following criteria have been met: Do not drive if you are taking narcotic pain medicine and make sure that your reaction time has returned.   4. You can shower as soon as the next day after surgery. Shower daily.  Use your regular soap and water  (not directly on the incision) and pat your incision(s) dry afterwards; don't rub.  No tub baths or submerging your body in water  until cleared by your surgeon. If you have the soap that was given to you by pre-surgical testing that was used before surgery, you do not need to use it afterwards because this can irritate your incisions.   5. No sexual activity and nothing in the vagina for 2-4 weeks.  6. You may experience a small amount of clear drainage from your incisions, which is normal.  If the drainage persists, increases, or changes color please call the office.  7. Do not use creams, lotions, or ointments such as neosporin on your incisions after surgery until advised by your surgeon because they can cause removal of the dermabond glue on your incisions.    8. Take Tylenol  or ibuprofen  (OR MOBIC , DO NOT TAKE TOGETHER SINCE THEY WORK SIMILARLY) first for pain if you are able to take these medications and only use narcotic pain medication for severe pain not relieved by the Tylenol  or Ibuprofen  (OR MOBIC ).  Monitor your Tylenol  intake to a max of 4,000 mg in a 24 hour period.   Diet: 1. Low sodium Heart Healthy Diet is recommended but you are cleared to resume your normal (before surgery) diet after your procedure.  2. It is safe to use a laxative, such as Miralax  or Colace, if you have difficulty moving your  bowels before surgery. You have been prescribed Sennakot-S to take at bedtime every evening after surgery to keep bowel movements regular and to prevent constipation.    Wound Care: 1. Keep clean and dry.  Shower daily.  Reasons to call the Doctor: Fever - Oral  temperature greater than 100.4 degrees Fahrenheit Foul-smelling vaginal discharge Difficulty urinating Nausea and vomiting Increased pain at the site of the incision that is unrelieved with pain medicine. Difficulty breathing with or without chest pain New calf pain especially if only on one side Sudden, continuing increased vaginal bleeding with or without clots.   Contacts: For questions or concerns you should contact:  Dr. [name] at [phone]  Eleanor Epps, NP at [phone]  After Hours: call [phone] and have the GYN Oncologist paged/contacted (after 5 pm or on the weekends). You will speak with an after hours RN and let he or she know you have had surgery.  Messages sent via mychart are for non-urgent matters and are not responded to after hours so for urgent needs, please call the after hours number.

## 2024-06-28 NOTE — Progress Notes (Signed)
 Patient here for a pre-operative appointment prior to her scheduled surgery on 07/11/2024. She is scheduled for a robotic assisted pelvic lymphadenectomy and lymph node debulking. The surgery was discussed in detail.  See after visit summary for additional details.      Discussed post-op pain management in detail including the aspects of the enhanced recovery pathway.  We discussed the use of tylenol  post-op and to monitor for a maximum of 4,000 mg in a 24 hour period.  Also let her know that sennakot will be prescribed to be used after surgery and to hold if having loose stools.  Discussed bowel regimen in detail.     Discussed the use of SCDs and measures to take at home to prevent DVT including frequent mobility.  Reportable signs and symptoms of DVT discussed. Post-operative instructions discussed and expectations for after surgery. Incisional care discussed as well including reportable signs and symptoms including erythema, drainage, wound separation.     30 minutes spent with the patient.  Verbalizing understanding of material discussed. No needs or concerns voiced at the end of the visit.   Advised patient to call for any needs.  Advised that her post-operative medications had been prescribed and could be picked up at any time.    This appointment is included in the global surgical bundle as pre-operative teaching and has no charge.

## 2024-06-28 NOTE — Telephone Encounter (Signed)
 Spoke with [name] and relayed message from provider that Dr. Eldonna has an opening on Tuesday, January 6 th for her procedure. Pt agreed to changing her surgery date from 1/13 to 1/6 and thanked the office for calling. Advised patient that Darryle Law should reach out with a new date for her pre-op either 1/2 or 1/5.

## 2024-06-30 ENCOUNTER — Ambulatory Visit: Admitting: Physical Therapy

## 2024-06-30 ENCOUNTER — Encounter: Payer: Self-pay | Admitting: Gynecologic Oncology

## 2024-06-30 NOTE — Progress Notes (Signed)
 " FAMILY MEDICINE - [facility] [place] [address]  June 30, 2024  Patient ID: [name] is a 74 y.o. female   Assessment/Plan:    Problem List Items Addressed This Visit       Endocrine   Type 1 diabetes mellitus with hyperglycemia    (CMD) (Chronic)   Relevant Orders   Albumin, Random Urine     Other   Hyperlipidemia LDL goal <70 - Primary   Chronic, stable, LDL goal < 70; due to DM - check lipids - cont statin - last CMP reviewed in Care Everywhere      Relevant Orders   Lipid Panel With Reflex Direct LDL   Other Visit Diagnoses       Encounter for Medicare annual wellness exam         Patient verbalizes understanding and in agreement with the above plan. All questions answered.   Medication side effects discussed with patient. Advised patient to call clinic or return for visit if these symptoms occur.  Goals of care discussed with patient including med compliance and adequate follow up. There are no known barriers to discussed goal. Return in about 6 months (around 12/28/2024) for Annual physical.  Ike Ditty, MD  Subjective:   Chief Complaint  Patient presents with   Hyperlipidemia   Medicare Annual Wellness Visit Subsequent   HPI: Patient has a history of hyperlipidemia. Patient takes crestor  as directed, no side effects Compliant with low fat diet, and exercises: Yes BMI: Body mass index is 24.09 kg/m. Denies CP/SOB/ HA/ motor or sensory deficits  Last lipids controlled: No No other complaints.   Medications Current Outpatient Medications  Medication Instructions   blood-glucose sensor (FreeStyle Libre 3 Sensor) 1 Device   calcium carbonate (TUMS) 500 mg (200 mg calcium) chewable tablet 1 tablet, Daily   Eliquis  5 mg, oral, 2 times daily - Transplant   ergocalciferol , vitamin D2, (VITAMIN D2 ORAL) Take by mouth.   gabapentin  (NEURONTIN ) 100 mg capsule Take 1-3 capsules nightly   levothyroxine  (SYNTHROID ) 75  mcg   meloxicam  (MOBIC ) 15 mg, Daily   multivitamin (THERAGRAN) tab tablet Take  by mouth.   rosuvastatin (CRESTOR) 5 mg, oral, Nightly    Allergies Sulfa (sulfonamide antibiotics) and Ibuprofen    Medical History Problem List[1]  Social History Social History[2]   Surgical History Surgical History[3]  Family History Family History[4]   I have reviewed and (if needed) updated the patient's problem list, medications, allergies, past medical and surgical history, social and family history.  ROS: Constitutional: No chills, fever, change in appetite, change in weight, malaise/fatigue.  HEENT: No headache, no ear pain or drainage, sore throat, no difficulty hearing.  Eyes: No visual changes or discharge, blurry vision, double vision. Cardiovascular:  No chest pain, orthopnea, palpitations, lower extremity edema. Respiratory: No cough, no shortness of breath, dyspnea on exertion, wheezing.   GI: No abdominal pain,  bowel changes, nausea, vomiting, diarrhea, constipation. Gu: No hematuria, dysuria, flank pain, frequency or urgency.  Neuro: No dizziness, seizures, tingling, numbness. Musculoskeletal: No joint pain, no myalgias. Skin: No rashes, abnormal bruising. Psychiatric/Behavioral: No mood changes, sleep changes, depression, anxiety, SI/HI. All other systems negative.  Objective:   Vital Signs Vitals:   06/30/24 1001  BP: (!) 97/59  Pulse: 77  Resp: 16  SpO2: 99%  Weight: 61.7 kg (136 lb)  Height: 1.6 m (5' 3)   Body mass index is 24.09 kg/m.  Constitutional: Well-developed and well-nourished. Sitting comfortably conversing  normally. Pleasant. Good eye contact. Eyes: Conjunctivae clear and pink, no pallor. PERRLA. Anicteric. Ears, Mouth, Nose: MMM. Oropharynx is normal, no erythema or exudates, uvula midline.  Neck: Symmetric. No visible masses. Trachea midline. No JVD. Cardiovascular: +S1S2, regular rate and rhythm, no murmurs, gallops or rubs appreciated.  Distal pulses 2+ all extremities. Respiratory: Normal effort, clear to auscultation bilaterally. No wheezes, rales or rhonchi noted.  Skin: Skin is warm and dry. + varicose veins RLE, mild dermatitis noted. Psychiatric: Alert and oriented x3. Behavior is Cooperative and Polite. Mood euthymyic. Affect is appropriate.   Diabetic Foot Exam Performed: Yes Diabetic Foot Exam Result:  [x]  Normal The patient's foot has been visually inspected with no ulcers, deformities, or abnormal toenails.  The patient can see the bottom of his/her feet and shoes fit properly. The patient can feel the 10g nylon filament on both feet for the 1st, 3rd, 5th metatarsals and big toes.  The patient's pedal pulses are palpable on both feet at dorsalis pedis and/or posterior tibial.  (If normal selected no need to complete below this line.) []  Abnormal  []  Abnormal with neuropathy []  Bilateral amputation lower extremities  Note - This document was created using the aid of voice recognition Scientist, clinical (histocompatibility and immunogenetics).  I agree the documentation is accurate and complete. Ike Jenkins Ditty, MD This document serves as a record of services personally performed by Ike Ditty COME .  It was created on their behalf by Delon Barrio, CMA, a trained medical scribe, and Certified Medical Assistant (CMA). During the course of documenting the history, physical exam and medical decision making, I was functioning as a stage manager. The creation of this record is the providers dictation and/or activities during the visit.  Electronically signed by Delon Barrio, CMA 06/30/2024 7:37 AM  Separate note for AWV  [facility]  - FAMILY MEDICINE Medicare Wellness Visit Type:: Subsequent Annual Wellness Visit  Name: [name] Date of Birth: [birth year] Age: 74 y.o. 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 Does the patient have a support system (family, friend, church, conservation officer, nature, etc)?: Yes Do you have any dental concerns?: No In the past month, have you experienced a change in your bladder control?: No Do you have any difficulty obtaining your medications?: No Do you have trouble consistently taking or remembering to take all of your medications as prescribed?: No Patient rates overall stress level as: None Does stress affect daily life?: No Typical amount of pain: some Does pain affect daily life?: No Are you currently prescribed opioids?: No  Depression Screening  Behavioral Health Screening  Patient Health Questionnaire-2 Score: 0 (06/30/2024 10:06 AM)      Patient's Depression screening/score = Negative   Depression Plan: Normal/Negative Screening  Social History (Tobacco/Drugs/Sexual Activity) [name]  reports that she has never smoked. She has never used smokeless tobacco. Tobacco Use?: No How many times in the past year have you used a recreational drug or used a prescription medication for nonmedical reasons?: None Risk factors for sexually transmitted infections (i.e., multiple sexual partners): No Are you bothered by sexual problems?: No  Alcohol  Screening How often do you have a drink containing alcohol ?: Never How many standard drinks containing alcohol  do you have on a typical day?: Never, 1 or 2 drinks How often do you have six or more drinks on one occasion?: Never Audit-C Score: 0  Physical Activity Regular exercise?: Yes Exercise frequency (times per week): 6 Exercise intensity: moderate (like brisk walking)  Diet How many meals a day?: Other Eats fruit and vegetables daily?: Yes Most meals are obtained by: preparing their own meals, eating out  Home and Transportation Safety All rugs have non-skid backing?: Yes All stairs or steps have  railings?: Yes Grab bars in the bathtub or shower?: (!) No Have non-skid surface in bathtub or shower?: Yes Good home lighting?: Yes Regular seat belt use?: Yes  Activities of Daily Living Feed self?: Yes Bathe self?: Yes Dress self?: Yes Use toilet without assistance?: Yes Walk without assistance?: Yes  Instrumental Activities of Daily Living Manage finances?: Yes Shop for themselves?: Yes Prepare meals?: Yes Use the telephone?: Yes Manage medications?: Yes Performs basic housework/laundry?: Yes Drives?: Yes Primary transportation is: driving  Hearing Concerns about hearing?: No Uses hearing aids?: No Hear whispered voice? (Observed): Yes  Vision Concerns about vision?: No Vision exam performed?: (!) No  Fall Risk Is the patient ambulatory?: Yes One or more falls in the last year:: No Feels unsteady when walking:: No  Cognitive Assessment Has a diagnosis of dementia or cognitive impairment?: No Are there any memory concerns by the patient, others, or providers?: No  Advance Directives Living will?: (!) No Advance directive information provided to patient: Yes Healthcare POA?: (!) No Who is your in case of emergency contact?: Bazinet,Matt Relationship to patient: Spouse Emergency contact's phone number: [phone]  Other History I reviewed and updated the following risk factors and conditions as appropriate: Reviewed/Updated: Problem List, Medical History, Surgical History, Family History, Medications, Allergies Reviewed/Updated: Vital Signs (height, weight, and BP), Immunizations, Health Maintenance Patient Care Team Updated: Done  Vital Signs BP (!) 97/59   Pulse 77   Resp 16   Ht 1.6 m (5' 3)   Wt 61.7 kg (136 lb)   SpO2 99%   BMI 24.09 kg/m   Screening and Immunizations Health Maintenance Status       Date Due Completion Dates   Diabetes:  Quantitative uACR for Kidney Evaluation Never done ---   Diabetes: Foot Exam Never done ---   ZOSTER  VACCINE (2 of 2) 10/15/2017 08/20/2017   Diabetes:  eGFR for Kidney Evaluation 11/05/2022 11/04/2021   COVID-19 Vaccine (7 - 2025-26 season) 06/30/2025 (Originally 02/28/2024) 04/19/2022, 11/20/2021   Diabetes: Hemoglobin A1C 11/28/2024 05/30/2024   Comprehensive Annual Visit 06/30/2025 06/30/2024, 12/20/2023   Depression Screening 06/30/2025 06/30/2024   Diabetes: Retinopathy Screening Combo 09/12/2025 09/13/2023, 09/13/2023   Breast Cancer Screening (Mammogram) 02/02/2026 02/03/2024, 12/04/2021   DTaP/Tdap/Td Vaccines (2 - Td or Tdap) 12/15/2026 12/14/2016   Colorectal Cancer Screening 11/28/2031 11/27/2021, 11/27/2021      Immunization History  Administered Date(s) Administered   Influenza Vaccine, Quadrivalent, Adjuvanted 03/21/2022   Influenza, High-dose Seasonal, Quadrivalent, Preservative Free 03/21/2022   Influenza, high-dose, trivalent, PF 03/21/2022, 03/07/2023, 03/19/2024   Moderna Covid-19, mRNA,LNP-S,PF 12+ Yrs 04/19/2022   Novel Influenza H1n1-09 06/07/2008   Pfizer SARS-CoV-2 Bivalent 12+ yrs 11/20/2021   Pfizer SARS-CoV-2 Primary Series 12+ yrs 07/21/2019, 08/08/2019, 04/30/2020, 12/04/2020   Pneumococcal Conjugate Vaccine 20-Valent (PREVNAR-20) 6 wks+ 12/20/2023   Pneumococcal Polysaccharide Vaccine, 23 Valent (PNEUMOVAX-23) 2Y+ 06/05/2004   RSV recombinant,PF(AREXVY)50Y+, not Medicare 12/29/2023   RSV, PF (ABRYSVO) 12/29/2023   TDAP VACCINE (BOOSTRIX,ADACEL) 7Y+ 12/14/2016   Varicella Zoster (SHINGRIX) 18Y+ 08/20/2017   Assessment/Plan: Subsequent Annual Wellness Visit: The topics above were reviewed with the patient.  Healthy lifestyle principles reviewed.  Recommendations provided when indicated.  Follow up 1 year for next wellness visit.  New Medications Ordered This Visit  Medications   calcium carbonate (TUMS) 500 mg (200 mg calcium) chewable tablet    Sig: Chew 1 tablet daily.   Patient Care Team: Ike Jenkins Ditty, MD as PCP - General (Family Medicine) Ike Jenkins Ditty, MD as PCP - Attributed Laymon JINNY Bring, RN as Nurse Navigator  Electronically signed by: Ike Jenkins Ditty, MD 06/30/2024 10:27 AM      [1] Patient Active Problem List Diagnosis   Hypothyroidism due to Hashimoto's thyroiditis   Primary osteoarthritis of left hip   Status post total replacement of left hip   Degenerative disc disease   Primary osteoarthritis involving multiple joints   Unilateral primary osteoarthritis, left knee   Unilateral primary osteoarthritis, right knee   Acute deep vein thrombosis (DVT) of femoral vein of right lower extremity    (CMD)   Mild anemia   Uterine cancer    (CMD)   Astigmatism of both eyes with presbyopia   Pinguecula of both eyes   Bilateral posterior capsular opacification   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   Pseudophakia, both eyes   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   Anemia due to antineoplastic chemotherapy   DKA (diabetic ketoacidosis) (CMD)   Hot flashes   Multiple thyroid  nodules   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)   Type 1 diabetes mellitus with hyperglycemia    (CMD)   Hyperlipidemia LDL goal <70  [2] Social History Socioeconomic History   Marital status: Married  Tobacco Use   Smoking status: Never   Smokeless tobacco: Never  Substance and Sexual Activity   Alcohol  use: Not Currently    Comment: Less than one a month   Drug use: Never   Sexual activity: Not Currently    Partners: Male    Birth control/protection: Post-menopausal   Social Drivers of Health   Living Situation: Low Risk (06/30/2024)   Living Situation    What is your living situation today?: I have a steady place to live    Think about the place you live. Do you have problems with any of the following? Choose all that apply:: None/None on this list  Food Insecurity: Low Risk (06/30/2024)   Food vital sign    Within the past 12 months, you worried that your food  would run out before you got money to buy more: Never true    Within the past 12 months, the food you bought just didn't last and you didn't have money to get more: Never true  Transportation Needs: No Transportation Needs (06/30/2024)   Transportation    In the past 12 months, has lack of reliable transportation kept you from medical appointments, meetings, work or from getting things needed for daily living? : No  Utilities: Low Risk (06/30/2024)   Utilities    In the past 12 months has the electric, gas, oil, or water  company threatened to shut off services in your home? : No  Safety: Low Risk (06/30/2024)   Safety    How often does anyone, including family and friends, physically hurt you?: Never    How often does anyone, including family and friends, insult or talk down to you?: Never    How often does anyone, including family and friends, threaten you with harm?: Never    How often does anyone, including family and friends, scream or curse at you?: Never  Alcohol  Screening:  Not At Risk (06/30/2024)   Alcohol     Audit C Alcohol  risk score: 0  Tobacco Use: Low Risk (06/30/2024)   Patient History    Smoking Tobacco Use: Never    Smokeless Tobacco Use: Never  Depression: Not At Risk (06/30/2024)   PHQ-2    PHQ-2 Score: 0  Social Connections: Socially Integrated (10/31/2023)   Received [facility]   Social Connection and Isolation Panel    In a typical week, how many times do you talk on the phone with family, friends, or neighbors?: More than three times a week    How often do you get together with friends or relatives?: Three times a week    How often do you attend church or religious services?: More than 4 times per year    Do you belong to any clubs or organizations such as church groups, unions, fraternal or athletic groups, or school groups?: Yes    How often do you attend meetings of the clubs or organizations you belong to?: More than 4 times per year    Are you  married, widowed, divorced, separated, never married, or living with a partner?: Married  [3] Past Surgical History: Procedure Laterality Date   CARPAL TUNNEL RELEASE     Procedure: CARPAL TUNNEL RELEASE   CATARACT EXTRACTION Bilateral    Procedure: CATARACT EXTRACTION   ECTOPIC PREGNANCY     Procedure: ECTOPIC PREGNANCY SURGERY   EYE SURGERY     Cataracts   HIP SURGERY Left    Procedure: HIP SURGERY   JOINT REPLACEMENT  June 2017   Left hip   LAPAROSCOPIC HYSTERECTOMY     LYMPH NODE BIOPSY  May15, 2024   TONSILLECTOMY     Procedure: TONSILLECTOMY   TOTAL ABDOMINAL HYSTERECTOMY  02/18/2024   TUBAL LIGATION  1978?   Tubular preg 1983  [4] Family History Problem Relation Name Age of Onset   Heart failure Mother Blima CROME. Deese    COPD Mother Blima CROME. Deese    Uterine cancer Mother Blima CROME. Deese    Arthritis Mother Blima CROME. Deese    Cancer Mother Blima CROME. Deese        Uterus   Heart disease Mother Blima CROME. Deese    Diabetes Sister Mliss CHARM Molt    Kidney cancer Father Lamar DOROTHA Ingles    Alcohol  abuse Father Lamar DOROTHA Ingles    Cancer Father Lamar DOROTHA Ingles        Kidney, lung   Glaucoma Neg Hx     Macular degeneration Neg Hx     Breast cancer Neg Hx    "

## 2024-07-02 NOTE — Progress Notes (Signed)
 COVID Vaccine received:  []  No [x]  Yes Date of any COVID positive Test in last 61 days:none  PCP -  Ike Ditty, MD at [facility]  Cardiologist -   Chest x-ray - 2017       02-05-2023  CT C/A/P Epic EKG -  11-02-2023  Epic Stress Test -  ECHO -  Cardiac Cath -  CT Coronary Calcium score:   Pacemaker / ICD device [x]  No []  Yes   Spinal Cord Stimulator:[x]  No []  Yes       History of Sleep Apnea? [x]  No []  Yes   CPAP used?- [x]  No []  Yes    Medication on DOS: Levothyroxine , Gabapentin , Tylenol  prn and  Eye Drops   Patient has: []  NO Hx DM   []  Pre-DM   [x]  DM1  []   DM2 Does the patient monitor blood sugar?   []  N/A   []  No [x]  Yes  Last A1c was: 8.0   on  05-30-24    Does patient have a [employer] or Dexcom? []  No [x]  Yes   Fasting Blood Sugar Ranges-  Checks Blood Sugar continuous times a day Insulin  glargine (Lantus )- 9 units q am and 4 units q pm Insulin  Aspart (Novolog ) SS ac tid   Blood Thinner / Instructions:  Aspirin  Instructions:  ASA 81 mg, continue but none DOS per MD   Activity level: Able to walk up 2 flights of stairs without becoming significantly short of breath or having chest pain?   [x]    Yes   []  No,  would have:  Patient can perform ADLs without assistance.  [x]   Yes    []  No   Comments: Has Port-a-cath on right upper anterior chest  Anesthesia review: IDDM type 1, hx DKA, PONV & slow to wake up, hx DVT d/t chemo tx,   Patient denies any S&S of respiratory illness or Covid - no shortness of breath, fever, cough or chest pain at PAT appointment.  Patient verbalized understanding and agreement to the Pre-Surgical Instructions that were given to them at this PAT appointment. Patient was also educated of the need to review these PAT instructions again prior to her surgery.I reviewed the appropriate phone numbers to call if they have any and questions or concerns.

## 2024-07-02 NOTE — Patient Instructions (Signed)
 SURGICAL WAITING ROOM VISITATION Patients having surgery or a procedure may have no more than 2 support people in the waiting area - these visitors may rotate in the visitor waiting room.   If the patient needs to stay at the hospital during part of their recovery, the visitor guidelines for inpatient rooms apply.  PRE-OP VISITATION  Pre-op nurse will coordinate an appropriate time for 1 support person to accompany the patient in pre-op.  This support person may not rotate.  This visitor will be contacted when the time is appropriate for the visitor to come back in the pre-op area.  Temporary Visitor Restrictions   Children ages 18 and under will not be able to visit patients in [facility] under most circumstances. Visitation is not restricted outside of hospitals unless noted otherwise in the [facility] and Location Specific Visitation Guidelines at :       [email or web address].com/.  Visitors with respiratory illnesses are discouraged from visiting and should remain at home.  You are not required to quarantine at this time prior to your surgery. However, you must do this: Hand Hygiene often Do NOT share personal items Notify your provider if you are in close contact with someone who has COVID or you develop fever 100.4 or greater, new onset of sneezing, cough, sore throat, shortness of breath or body aches.  If you test positive for Covid or have been in contact with anyone that has tested positive in the last 10 days please notify you surgeon.    Your procedure is scheduled on:  Tuesday  07-04-2024  Report to [facility] Main Entrance: [address] entrance where the [employer] is available.   Report to admitting at: 06:45  AM  Call this number if you have any questions or problems the morning of surgery [phone]  FOLLOW ANY ADDITIONAL PRE OP INSTRUCTIONS YOU RECEIVED FROM YOUR SURGEON'S OFFICE!!!  Eat a light diet the day before surgery.  Examples including soups,  broths, toast, yogurt, mashed potatoes.  AVOID GAS PRODUCING FOODS. Things to avoid include carbonated beverages (fizzy beverages, sodas), raw fruits and raw vegetables (uncooked), or beans.   How to Manage Your Diabetes Before and After Surgery  Why is it important to control my blood sugar before and after surgery? Improving blood sugar levels before and after surgery helps healing and can limit problems. A way of improving blood sugar control is eating a healthy diet by:  Eating less sugar and carbohydrates  Increasing activity/exercise  Talking with your doctor about reaching your blood sugar goals High blood sugars (greater than 180 mg/dL) can raise your risk of infections and slow your recovery, so you will need to focus on controlling your diabetes during the weeks before surgery. Make sure that the doctor who takes care of your diabetes knows about your planned surgery including the date and location.  How do I manage my blood sugar before surgery? Check your blood sugar at least 4 times a day, starting 2 days before surgery, to make sure that the level is not too high or low. Check your blood sugar the morning of your surgery when you wake up and every 2 hours until you get to the Short Stay unit. If your blood sugar is less than 70 mg/dL, you will need to treat for low blood sugar: Do not take insulin . Treat a low blood sugar (less than 70 mg/dL) with  cup of clear juice (cranberry or apple), 4 glucose tablets, OR glucose gel. Recheck blood  sugar in 15 minutes after treatment (to make sure it is greater than 70 mg/dL). If your blood sugar is not greater than 70 mg/dL on recheck, call [phone] for further instructions. Report your blood sugar to the short stay nurse when you get to Short Stay.  If you are admitted to the hospital after surgery: Your blood sugar will be checked by the staff and you will probably be given insulin  after surgery (instead of oral diabetes medicines)  to make sure you have good blood sugar levels. The goal for blood sugar control after surgery is 80-180 mg/dL.   WHAT DO I DO ABOUT MY DIABETES MEDICATION?  Insulin  glargine (Lantus )-  Day BEFORE surgery: take 9 units in the morning and 3 units in the evening.  DAY OF SURGERY: Take 7 units   Insulin  Aspart (Novolog )  Day BEFORE surgery; take as usual with meals but no bedtime dose.   DAY OF SURGERY: If your CBG is greater than 220 mg/dL, you may take  of your sliding scale (correction) dose of insulin .    IF you have any questions, call the nurse at [phone]   Do not eat food after Midnight the night prior to your surgery/procedure.  After Midnight you may have the following liquids until  06:00 AM DAY OF SURGERY  Clear Liquid Diet Water  Black Coffee (sugar ok, NO MILK/CREAM OR CREAMERS)  Tea (sugar ok, NO MILK/CREAM OR CREAMERS) regular and decaf                             Plain Jell-O  with no fruit (NO RED)                                           Fruit ices (not with fruit pulp, NO RED)                                     Popsicles (NO RED)                                                                  Juice: NO CITRUS JUICES: only apple, WHITE grape, WHITE cranberry Sports drinks like Gatorade or Powerade (NO RED)              Oral Hygiene is also important to reduce your risk of infection.        Remember - BRUSH YOUR TEETH THE MORNING OF SURGERY WITH YOUR REGULAR TOOTHPASTE  Do NOT smoke after Midnight the night before surgery.  STOP TAKING all Vitamins, Herbs and supplements 1 week before your surgery.   Take ONLY these medicines the morning of surgery with A SIP OF WATER : Levothyroxine , you may take Gabapentin  and Tylenol  if needed. You may use your Eye drops if needed.                    You may not have any metal on your body including hair pins, jewelry, and body piercing  Do not wear make-up, lotions, powders, perfumes or deodorant  Do not wear nail  polish including gel and S&S, artificial / acrylic nails, or any other type of covering on natural nails including finger and toenails. If you have artificial nails, gel coating, etc., that needs to be removed by a nail salon, Please have this removed prior to surgery. Not doing so may mean that your surgery could be cancelled or delayed if the Surgeon or anesthesia staff feels like they are unable to monitor you safely.   Do not shave 48 hours prior to surgery to avoid nicks in your skin which may contribute to postoperative infections.   Contacts, Hearing Aids, dentures or bridgework may not be worn into surgery. DENTURES WILL BE REMOVED PRIOR TO SURGERY PLEASE DO NOT APPLY Poly grip OR ADHESIVES!!!  Patients discharged on the day of surgery will not be allowed to drive home.  Someone NEEDS to stay with you for the first 24 hours after anesthesia.  Do not bring your home medications to the hospital. The Pharmacy will dispense medications listed on your medication list to you during your admission in the Hospital.  Special Instructions: Bring a copy of your healthcare power of attorney and living will documents the day of surgery, if you wish to have them scanned into your [facility] Records- EPIC  Please read over the following fact sheets you were given: IF YOU HAVE QUESTIONS ABOUT YOUR PRE-OP INSTRUCTIONS, PLEASE CALL [phone]   [facility] - Preparing for Surgery        Before surgery, you can play an important role.  Because skin is not sterile, your skin needs to be as free of germs as possible.  You can reduce the number of germs on your skin by washing with CHG (chlorahexidine gluconate) soap before surgery.  CHG is an antiseptic cleaner which kills germs and bonds with the skin to continue killing germs even after washing. Please DO NOT use if you have an allergy to CHG or antibacterial soaps.  If your skin becomes reddened/irritated stop using the CHG and inform your nurse  when you arrive at Short Stay. Do not shave (including legs and underarms) for at least 48 hours prior to the first CHG shower.  You may shave your face/neck.  Please follow these instructions carefully:  1.  Shower with CHG Soap the night before surgery ONLY (DO NOT USE THE CHG SOAP THE MORNING OF SURGERY).  2.  If you choose to wash your hair, wash your hair first as usual with your normal  shampoo.  3.  After you shampoo, rinse your hair and body thoroughly to remove the shampoo.                             4.  Use CHG as you would any other liquid soap.  You can apply chg directly to the skin and wash.  Gently with a scrungie or clean washcloth.  5.  Apply the CHG Soap to your body ONLY FROM THE NECK DOWN.   Do not use on face/ open                           Wound or open sores. Avoid contact with eyes, ears mouth and genitals (private parts).                       Wash face,  Genitals (private parts) with your normal soap.  6.  Wash thoroughly, paying special attention to the area where your  surgery  will be performed.  7.  Thoroughly rinse your body with warm water  from the neck down.  8.  DO NOT shower/wash with your normal soap after using and rinsing off the CHG Soap.                9.  Pat yourself dry with a clean towel.            10.  Wear clean pajamas.            11.  Place clean sheets on your bed the night of your first shower and do not  sleep with pets.  Day of Surgery : Do not apply any CHG, lotions/deodorants the morning of surgery.  Please wear clean clothes to the hospital/surgery center.   FAILURE TO FOLLOW THESE INSTRUCTIONS MAY RESULT IN THE CANCELLATION OF YOUR SURGERY  PATIENT SIGNATURE_________________________________  NURSE SIGNATURE__________________________________  ________________________________________________________________________

## 2024-07-03 ENCOUNTER — Encounter: Payer: Self-pay | Admitting: Hematology and Oncology

## 2024-07-03 ENCOUNTER — Encounter (HOSPITAL_COMMUNITY): Payer: Self-pay

## 2024-07-03 ENCOUNTER — Encounter (HOSPITAL_COMMUNITY)
Admission: RE | Admit: 2024-07-03 | Discharge: 2024-07-03 | Disposition: A | Discharge status: Home / Self Care | Source: Ambulatory Visit | Attending: Psychiatry | Admitting: Psychiatry

## 2024-07-03 ENCOUNTER — Encounter: Payer: Self-pay | Admitting: Psychiatry

## 2024-07-03 ENCOUNTER — Other Ambulatory Visit: Payer: Self-pay

## 2024-07-03 ENCOUNTER — Telehealth: Payer: Self-pay | Admitting: *Deleted

## 2024-07-03 VITALS — BP 110/60 | HR 84 | Temp 98.6°F | Resp 16 | Ht 63.0 in | Wt 136.0 lb

## 2024-07-03 DIAGNOSIS — Z8542 Personal history of malignant neoplasm of other parts of uterus: Secondary | ICD-10-CM | POA: Insufficient documentation

## 2024-07-03 DIAGNOSIS — Z01812 Encounter for preprocedural laboratory examination: Secondary | ICD-10-CM | POA: Insufficient documentation

## 2024-07-03 DIAGNOSIS — Z01818 Encounter for other preprocedural examination: Secondary | ICD-10-CM

## 2024-07-03 DIAGNOSIS — E109 Type 1 diabetes mellitus without complications: Secondary | ICD-10-CM | POA: Insufficient documentation

## 2024-07-03 DIAGNOSIS — R590 Localized enlarged lymph nodes: Secondary | ICD-10-CM

## 2024-07-03 DIAGNOSIS — C579 Malignant neoplasm of female genital organ, unspecified: Secondary | ICD-10-CM | POA: Insufficient documentation

## 2024-07-03 DIAGNOSIS — C55 Malignant neoplasm of uterus, part unspecified: Secondary | ICD-10-CM

## 2024-07-03 HISTORY — DX: Lymphedema, not elsewhere classified: I89.0

## 2024-07-03 HISTORY — DX: Type 2 diabetes mellitus without complications: E11.9

## 2024-07-03 LAB — COMPREHENSIVE METABOLIC PANEL WITH GFR
ALT: 13 U/L (ref 0–44)
AST: 21 U/L (ref 15–41)
Albumin: 3.8 g/dL (ref 3.5–5.0)
Alkaline Phosphatase: 96 U/L (ref 38–126)
Anion gap: 8 (ref 5–15)
BUN: 22 mg/dL (ref 8–23)
CO2: 28 mmol/L (ref 22–32)
Calcium: 9 mg/dL (ref 8.9–10.3)
Chloride: 99 mmol/L (ref 98–111)
Creatinine, Ser: 0.85 mg/dL (ref 0.44–1.00)
GFR, Estimated: >60 mL/min
Glucose, Bld: 298 mg/dL — ABNORMAL HIGH (ref 70–99)
Potassium: 4.3 mmol/L (ref 3.5–5.1)
Sodium: 134 mmol/L — ABNORMAL LOW (ref 135–145)
Total Bilirubin: 0.4 mg/dL (ref 0.0–1.2)
Total Protein: 6.6 g/dL (ref 6.5–8.1)

## 2024-07-03 LAB — GLUCOSE, CAPILLARY: Glucose-Capillary: 298 mg/dL — ABNORMAL HIGH (ref 70–99)

## 2024-07-03 LAB — CBC
HCT: 33.7 % — ABNORMAL LOW (ref 36.0–46.0)
Hemoglobin: 10.9 g/dL — ABNORMAL LOW (ref 12.0–15.0)
MCH: 30.7 pg (ref 26.0–34.0)
MCHC: 32.3 g/dL (ref 30.0–36.0)
MCV: 94.9 fL (ref 80.0–100.0)
Platelets: 240 K/uL (ref 150–400)
RBC: 3.55 MIL/uL — ABNORMAL LOW (ref 3.87–5.11)
RDW: 12.9 % (ref 11.5–15.5)
WBC: 6.3 K/uL (ref 4.0–10.5)
nRBC: 0 % (ref 0.0–0.2)

## 2024-07-03 NOTE — Telephone Encounter (Signed)
Telephone call to check on pre-operative status.  Patient compliant with pre-operative instructions.  Reinforced nothing to eat after midnight. Clear liquids until 0550. Patient to arrive at 0650.  No questions or concerns voiced.  Instructed to call for any needs. 

## 2024-07-03 NOTE — Anesthesia Preprocedure Evaluation (Addendum)
"                                    Anesthesia Evaluation  Patient identified by MRN, date of birth, ID band Patient awake    Reviewed: Allergy & Precautions, NPO status , Patient's Chart, lab work & pertinent test results  History of Anesthesia Complications (+) PONV and history of anesthetic complications  Airway Mallampati: II  TM Distance: >3 FB Neck ROM: Full    Dental no notable dental hx.    Pulmonary neg pulmonary ROS   Pulmonary exam normal        Cardiovascular negative cardio ROS Normal cardiovascular exam     Neuro/Psych  Neuromuscular disease    GI/Hepatic negative GI ROS, Neg liver ROS,,,  Endo/Other  diabetes, Insulin  DependentHypothyroidism    Renal/GU negative Renal ROS     Musculoskeletal   Abdominal   Peds  Hematology  (+) Blood dyscrasia, anemia   Anesthesia Other Findings Gynecologic malignancy  Reproductive/Obstetrics                              Anesthesia Physical Anesthesia Plan  ASA: 3  Anesthesia Plan: General   Post-op Pain Management:    Induction: Intravenous  PONV Risk Score and Plan: 4 or greater and Ondansetron , Dexamethasone , Propofol  infusion, Midazolam , Treatment may vary due to age or medical condition and Amisulpride   Airway Management Planned: Oral ETT  Additional Equipment:   Intra-op Plan:   Post-operative Plan: Extubation in OR  Informed Consent: I have reviewed the patients History and Physical, chart, labs and discussed the procedure including the risks, benefits and alternatives for the proposed anesthesia with the patient or authorized representative who has indicated his/her understanding and acceptance.     Dental advisory given  Plan Discussed with: CRNA  Anesthesia Plan Comments: (PAT note from 1/5)         Anesthesia Quick Evaluation  "

## 2024-07-03 NOTE — Progress Notes (Signed)
 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  Prior anesthesia complications include PONV, prolonged emergence, hx of convulsions after spinal anesthesia  Patient follows with Oncology for uterine cancer diagnosed in 2024. She is s/p chemotherapy and now scheduled for surgery. 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   Seen by Gyn-Onc on 10/27 and patient reported groin pain. CT abdomen/pelvis ordered which showed new left iliac adenopathy. PET scan done on 11/25 confirmed progressive disease. Now scheduled for surgery above.   Seen by Endocrine on 05/30/24. A1c levels noted to be improving. CBG at pre op was 298.   VS: BP 110/60   Pulse 84   Temp 37 C (Oral)   Resp 16   Ht 5' 3 (1.6 m)   Wt 61.7 kg   SpO2 96%   BMI 24.09 kg/m   PROVIDERS: Alray Loader, MD   LABS: Labs reviewed: Acceptable for surgery. (all labs ordered are listed, but only abnormal results are displayed)  Labs Reviewed  GLUCOSE, CAPILLARY - Abnormal; Notable for the following components:      Result Value   Glucose-Capillary 298 (*)    All other components within normal limits  CBC  COMPREHENSIVE METABOLIC PANEL WITH GFR  TYPE AND SCREEN     PET scan 05/23/24:  IMPRESSION: 1. Isolated left pelvic sidewall hypermetabolic adenopathy, confirming recurrent/progressive disease since 01/28/24. 2. When compared to the most recent PET of 11/16/22, response to therapy throughout the remainder of the  abdomen and pelvis. 3. Aortic atherosclerosis (ICD-10 I70.0).   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   Top of Left Hand(Tx p Bx)    Past Surgical History:  Procedure Laterality Date   CARPAL TUNNEL RELEASE     BILAT     CATARACT EXTRACTION     BILAT    ECTOPIC PREGNANCY SURGERY     in the setting of prior BTL, lapartomy   IR IMAGING GUIDED PORT INSERTION  12/02/2022   KNEE CARTILAGE SURGERY     RIGHT    (MENISCUS)   LAPAROTOMY N/A 02/16/2023   Procedure: MINI LAPAROTOMY;  Surgeon: Eldonna Mays, MD;  Location[facility];  Service: Gynecology;  Laterality: N/A;   LYMPH NODE DISSECTION Right 02/16/2023   Procedure:  RIGHT PELVIC LYMPH NODE DISSECTION;  Surgeon: Eldonna Mays, MD;  Location[facility];  Service: Gynecology;  Laterality: Right;   MOHS SURGERY     left nose/inner eye for basal cell   TONSILLECTOMY     TOTAL HIP ARTHROPLASTY Left 12/19/2015   Procedure: LEFT TOTAL HIP ARTHROPLASTY;  Surgeon: Maude LELON Right, MD;  Location: [facility];  Service: Orthopedics;  Laterality: Left;   TRIGGER FINGER RELEASE     TUBAL LIGATION      MEDICATIONS:  acetaminophen  (TYLENOL ) 500 MG tablet   aspirin  EC 81 MG tablet   Blood Glucose Monitoring Suppl (BLOOD GLUCOSE MONITOR SYSTEM) w/Device KIT   calcium carbonate (TUMS) 500 MG chewable tablet   Carboxymethylcellulose Sodium (THERATEARS  OP)   Cholecalciferol (VITAMIN D ) 50 MCG (2000 UT) tablet   Continuous Glucose Sensor (FREESTYLE LIBRE 3 PLUS SENSOR) MISC   diclofenac  sodium (VOLTAREN ) 1 % GEL   DULoxetine  (CYMBALTA ) 30 MG capsule   gabapentin  (NEURONTIN ) 100 MG capsule   Glucose Blood (BLOOD GLUCOSE TEST STRIPS) STRP   insulin  aspart (NOVOLOG ) 100 UNIT/ML FlexPen   insulin  glargine (LANTUS ) 100 UNIT/ML Solostar Pen   Insulin  Pen Needle (PEN NEEDLES) 31G X 5 MM MISC   Lancet Device MISC   Lancets MISC   levothyroxine  (SYNTHROID ) 125 MCG tablet   lidocaine -prilocaine  (EMLA ) cream   melatonin 5 MG TABS   meloxicam  (MOBIC ) 7.5 MG tablet   Multiple Vitamin (MULTIVITAMIN) tablet   rosuvastatin (CRESTOR) 5 MG tablet   senna-docusate (SENOKOT-S) 8.6-50 MG tablet   No current facility-administered medications for this encounter.   [name] Short Stay/Anesthesiology [facility] Phone [phone] 07/03/2024 2:18 PM       "

## 2024-07-04 ENCOUNTER — Ambulatory Visit (HOSPITAL_COMMUNITY)
Admission: RE | Admit: 2024-07-04 | Discharge: 2024-07-04 | Disposition: A | Discharge status: Home / Self Care | Attending: Psychiatry | Admitting: Psychiatry

## 2024-07-04 ENCOUNTER — Ambulatory Visit (HOSPITAL_COMMUNITY): Admitting: Medical

## 2024-07-04 ENCOUNTER — Ambulatory Visit (HOSPITAL_COMMUNITY): Admitting: Anesthesiology

## 2024-07-04 ENCOUNTER — Encounter (HOSPITAL_COMMUNITY): Payer: Self-pay | Admitting: Psychiatry

## 2024-07-04 ENCOUNTER — Encounter (HOSPITAL_COMMUNITY): Admission: RE | Disposition: A | Payer: Self-pay | Source: Home / Self Care | Attending: Psychiatry

## 2024-07-04 ENCOUNTER — Ambulatory Visit: Admitting: Physical Therapy

## 2024-07-04 DIAGNOSIS — C775 Secondary and unspecified malignant neoplasm of intrapelvic lymph nodes: Secondary | ICD-10-CM | POA: Insufficient documentation

## 2024-07-04 DIAGNOSIS — Z794 Long term (current) use of insulin: Secondary | ICD-10-CM | POA: Insufficient documentation

## 2024-07-04 DIAGNOSIS — E119 Type 2 diabetes mellitus without complications: Secondary | ICD-10-CM

## 2024-07-04 DIAGNOSIS — C579 Malignant neoplasm of female genital organ, unspecified: Secondary | ICD-10-CM | POA: Diagnosis present

## 2024-07-04 DIAGNOSIS — R590 Localized enlarged lymph nodes: Secondary | ICD-10-CM | POA: Diagnosis not present

## 2024-07-04 DIAGNOSIS — K682 Retroperitoneal fibrosis: Secondary | ICD-10-CM | POA: Diagnosis not present

## 2024-07-04 DIAGNOSIS — I700 Atherosclerosis of aorta: Secondary | ICD-10-CM | POA: Diagnosis not present

## 2024-07-04 DIAGNOSIS — Z8542 Personal history of malignant neoplasm of other parts of uterus: Secondary | ICD-10-CM | POA: Insufficient documentation

## 2024-07-04 DIAGNOSIS — Z9221 Personal history of antineoplastic chemotherapy: Secondary | ICD-10-CM | POA: Insufficient documentation

## 2024-07-04 DIAGNOSIS — Z01818 Encounter for other preprocedural examination: Secondary | ICD-10-CM

## 2024-07-04 DIAGNOSIS — E109 Type 1 diabetes mellitus without complications: Secondary | ICD-10-CM | POA: Insufficient documentation

## 2024-07-04 DIAGNOSIS — E039 Hypothyroidism, unspecified: Secondary | ICD-10-CM | POA: Insufficient documentation

## 2024-07-04 DIAGNOSIS — C55 Malignant neoplasm of uterus, part unspecified: Secondary | ICD-10-CM | POA: Diagnosis not present

## 2024-07-04 DIAGNOSIS — Z1509 Genetic susceptibility to other malignant neoplasm: Secondary | ICD-10-CM | POA: Insufficient documentation

## 2024-07-04 LAB — GLUCOSE, CAPILLARY
Glucose-Capillary: 156 mg/dL — ABNORMAL HIGH (ref 70–99)
Glucose-Capillary: 193 mg/dL — ABNORMAL HIGH (ref 70–99)
Glucose-Capillary: 151 mg/dL — ABNORMAL HIGH (ref 70–99)

## 2024-07-04 LAB — TYPE AND SCREEN
ABO/RH(D): O POS
Antibody Screen: NEGATIVE

## 2024-07-04 SURGERY — LYMPHADENECTOMY, PELVIS, ROBOT-ASSISTED
Anesthesia: General | Laterality: Bilateral

## 2024-07-04 MED ORDER — ONDANSETRON HCL 4 MG/2ML IJ SOLN: 4.0000 mg | Freq: Once | INTRAMUSCULAR | Status: DC | PRN

## 2024-07-04 MED ORDER — HEMOSTATIC AGENTS (NO CHARGE) OPTIME
TOPICAL | Status: DC | PRN
  Administered 2024-07-04: 1 via TOPICAL

## 2024-07-04 MED ORDER — PROPOFOL 10 MG/ML IV BOLUS
INTRAVENOUS | Status: AC
  Filled 2024-07-04: qty 20

## 2024-07-04 MED ORDER — BUPIVACAINE HCL 0.25 % IJ SOLN
INTRAMUSCULAR | Status: DC | PRN
  Administered 2024-07-04: 15 mL

## 2024-07-04 MED ORDER — INSULIN ASPART 100 UNIT/ML IJ SOLN
INTRAMUSCULAR | Status: DC | PRN
  Administered 2024-07-04: 2 [IU] via SUBCUTANEOUS

## 2024-07-04 MED ORDER — STERILE WATER FOR IRRIGATION IR SOLN
Status: DC | PRN
  Administered 2024-07-04: 1000 mL

## 2024-07-04 MED ORDER — ROCURONIUM BROMIDE 100 MG/10ML IV SOLN
INTRAVENOUS | Status: DC | PRN
  Administered 2024-07-04: 60 mg via INTRAVENOUS
  Administered 2024-07-04 (×2): 20 mg via INTRAVENOUS

## 2024-07-04 MED ORDER — PROPOFOL 1000 MG/100ML IV EMUL
INTRAVENOUS | Status: AC
  Filled 2024-07-04: qty 100

## 2024-07-04 MED ORDER — AMISULPRIDE (ANTIEMETIC) 5 MG/2ML IV SOLN
INTRAVENOUS | Status: DC | PRN
  Administered 2024-07-04: 5 mg via INTRAVENOUS

## 2024-07-04 MED ORDER — LACTATED RINGERS IR SOLN
Status: DC | PRN
  Administered 2024-07-04: 1000 mL

## 2024-07-04 MED ORDER — PROPOFOL 500 MG/50ML IV EMUL
INTRAVENOUS | Status: DC | PRN
  Administered 2024-07-04: 150 ug/kg/min via INTRAVENOUS

## 2024-07-04 MED ORDER — FENTANYL CITRATE (PF) 50 MCG/ML IJ SOSY
25.0000 ug | PREFILLED_SYRINGE | INTRAMUSCULAR | Status: DC | PRN
  Administered 2024-07-04: 50 ug via INTRAVENOUS

## 2024-07-04 MED ORDER — KETOROLAC TROMETHAMINE 15 MG/ML IJ SOLN
INTRAMUSCULAR | Status: AC
  Filled 2024-07-04: qty 1

## 2024-07-04 MED ORDER — FENTANYL CITRATE (PF) 50 MCG/ML IJ SOSY
PREFILLED_SYRINGE | INTRAMUSCULAR | Status: AC
  Filled 2024-07-04: qty 1

## 2024-07-04 MED ORDER — FENTANYL CITRATE (PF) 250 MCG/5ML IJ SOLN
INTRAMUSCULAR | Status: DC | PRN
  Administered 2024-07-04: 150 ug via INTRAVENOUS

## 2024-07-04 MED ORDER — LIDOCAINE HCL (CARDIAC) PF 100 MG/5ML IV SOSY
PREFILLED_SYRINGE | INTRAVENOUS | Status: DC | PRN
  Administered 2024-07-04: 60 mg via INTRAVENOUS

## 2024-07-04 MED ORDER — FENTANYL CITRATE (PF) 250 MCG/5ML IJ SOLN
INTRAMUSCULAR | Status: AC
  Filled 2024-07-04: qty 5

## 2024-07-04 MED ORDER — AMISULPRIDE (ANTIEMETIC) 5 MG/2ML IV SOLN: 10.0000 mg | Freq: Once | INTRAVENOUS | Status: DC | PRN

## 2024-07-04 MED ORDER — HEPARIN SODIUM (PORCINE) 5000 UNIT/ML IJ SOLN
5000.0000 [IU] | INTRAMUSCULAR | Status: AC
  Administered 2024-07-04: 5000 [IU] via SUBCUTANEOUS
  Filled 2024-07-04: qty 1

## 2024-07-04 MED ORDER — SCOPOLAMINE 1 MG/3DAYS TD PT72
1.0000 | MEDICATED_PATCH | TRANSDERMAL | Status: DC
  Administered 2024-07-04: 1 mg via TRANSDERMAL
  Filled 2024-07-04: qty 1

## 2024-07-04 MED ORDER — KETOROLAC TROMETHAMINE 15 MG/ML IJ SOLN
15.0000 mg | Freq: Once | INTRAMUSCULAR | Status: AC | PRN
  Administered 2024-07-04: 15 mg via INTRAVENOUS

## 2024-07-04 MED ORDER — LACTATED RINGERS IV SOLN: INTRAVENOUS | Status: DC

## 2024-07-04 MED ORDER — ACETAMINOPHEN 500 MG PO TABS
1000.0000 mg | ORAL_TABLET | ORAL | Status: AC
  Administered 2024-07-04: 1000 mg via ORAL
  Filled 2024-07-04: qty 2

## 2024-07-04 MED ORDER — MIDAZOLAM HCL 2 MG/2ML IJ SOLN
INTRAMUSCULAR | Status: AC
  Filled 2024-07-04: qty 2

## 2024-07-04 MED ORDER — SUGAMMADEX SODIUM 200 MG/2ML IV SOLN
INTRAVENOUS | Status: DC | PRN
  Administered 2024-07-04: 150 mg via INTRAVENOUS

## 2024-07-04 MED ORDER — CHLORHEXIDINE GLUCONATE 0.12 % MT SOLN
15.0000 mL | Freq: Once | OROMUCOSAL | Status: AC
  Administered 2024-07-04: 15 mL via OROMUCOSAL

## 2024-07-04 MED ORDER — AMISULPRIDE (ANTIEMETIC) 5 MG/2ML IV SOLN
INTRAVENOUS | Status: AC
  Filled 2024-07-04: qty 2

## 2024-07-04 MED ORDER — PROPOFOL 10 MG/ML IV BOLUS
INTRAVENOUS | Status: DC | PRN
  Administered 2024-07-04: 100 mg via INTRAVENOUS

## 2024-07-04 MED ORDER — SILVER NITRATE-POT NITRATE 75-25 % EX MISC
CUTANEOUS | Status: AC
  Filled 2024-07-04: qty 10

## 2024-07-04 MED ORDER — MIDAZOLAM HCL 5 MG/5ML IJ SOLN
INTRAMUSCULAR | Status: DC | PRN
  Administered 2024-07-04 (×2): 1 mg via INTRAVENOUS

## 2024-07-04 MED ORDER — BUPIVACAINE HCL (PF) 0.25 % IJ SOLN
INTRAMUSCULAR | Status: AC
  Filled 2024-07-04: qty 30

## 2024-07-04 MED ORDER — DEXAMETHASONE SOD PHOSPHATE PF 10 MG/ML IJ SOLN
4.0000 mg | INTRAMUSCULAR | Status: AC
  Administered 2024-07-04: 4 mg via INTRAVENOUS

## 2024-07-04 MED ORDER — ONDANSETRON HCL 4 MG/2ML IJ SOLN
INTRAMUSCULAR | Status: DC | PRN
  Administered 2024-07-04: 4 mg via INTRAVENOUS

## 2024-07-04 MED ORDER — ORAL CARE MOUTH RINSE: 15.0000 mL | Freq: Once | OROMUCOSAL | Status: AC

## 2024-07-04 MED ORDER — LACTATED RINGERS IV SOLN: INTRAVENOUS | Status: DC | PRN

## 2024-07-04 MED ORDER — SUGAMMADEX SODIUM 200 MG/2ML IV SOLN
INTRAVENOUS | Status: AC
  Filled 2024-07-04: qty 2

## 2024-07-04 SURGICAL SUPPLY — 64 items
APPLICATOR SURGIFLO ENDO (HEMOSTASIS) IMPLANT
BAG LAPAROSCOPIC 12 15 PORT 16 (BASKET) IMPLANT
BLADE SURG SZ10 CARB STEEL (BLADE) IMPLANT
CHLORAPREP W/TINT 26 (MISCELLANEOUS) ×1 IMPLANT
COVER BACK TABLE 60X90IN (DRAPES) ×1 IMPLANT
COVER TIP SHEARS 8 DVNC (MISCELLANEOUS) ×1 IMPLANT
DERMABOND ADVANCED .7 DNX12 (GAUZE/BANDAGES/DRESSINGS) ×1 IMPLANT
DRAPE ARM DVNC X/XI (DISPOSABLE) ×4 IMPLANT
DRAPE COLUMN DVNC XI (DISPOSABLE) ×1 IMPLANT
DRAPE SHEET LG 3/4 BI-LAMINATE (DRAPES) ×1 IMPLANT
DRAPE SURG IRRIG POUCH 19X23 (DRAPES) ×1 IMPLANT
DRIVER NDLE MEGA SUTCUT DVNCXI (INSTRUMENTS) ×1 IMPLANT
DRSG OPSITE POSTOP 4X6 (GAUZE/BANDAGES/DRESSINGS) IMPLANT
DRSG OPSITE POSTOP 4X8 (GAUZE/BANDAGES/DRESSINGS) IMPLANT
ELECT REM PT RETURN 15FT ADLT (MISCELLANEOUS) ×1 IMPLANT
FORCEPS BPLR FENES DVNC XI (FORCEP) ×1 IMPLANT
FORCEPS PROGRASP DVNC XI (FORCEP) ×1 IMPLANT
GAUZE 4X4 16PLY ~~LOC~~+RFID DBL (SPONGE) ×1 IMPLANT
GLOVE BIO SURGEON STRL SZ 6.5 (GLOVE) ×1 IMPLANT
GLOVE BIOGEL PI IND STRL 6.5 (GLOVE) ×2 IMPLANT
GLOVE BIOGEL PI MICRO STRL 6 (GLOVE) ×4 IMPLANT
GOWN STRL REUS W/ TWL LRG LVL3 (GOWN DISPOSABLE) ×4 IMPLANT
GRASPER SUT TROCAR 14GX15 (MISCELLANEOUS) IMPLANT
HOLDER FOLEY CATH W/STRAP (MISCELLANEOUS) IMPLANT
IRRIGATION SUCT STRKRFLW 2 WTP (MISCELLANEOUS) ×1 IMPLANT
KIT PROCEDURE DVNC SI (MISCELLANEOUS) IMPLANT
KIT TURNOVER KIT A (KITS) ×1 IMPLANT
LIGASURE IMPACT 36 18CM CVD LR (INSTRUMENTS) IMPLANT
MANIPULATOR ADVINCU DEL 3.0 PL (MISCELLANEOUS) IMPLANT
MANIPULATOR ADVINCU DEL 3.5 PL (MISCELLANEOUS) IMPLANT
MANIPULATOR UTERINE 4.5 ZUMI (MISCELLANEOUS) IMPLANT
NEEDLE HYPO 21X1.5 SAFETY (NEEDLE) ×1 IMPLANT
NEEDLE INSUFFLATION 14GA 120MM (NEEDLE) IMPLANT
NEEDLE SPNL 20GX3.5 QUINCKE YW (NEEDLE) IMPLANT
OBTURATOR OPTICALSTD 8 DVNC (TROCAR) ×1 IMPLANT
PACK ROBOT GYN CUSTOM WL (TRAY / TRAY PROCEDURE) ×1 IMPLANT
PAD ARMBOARD POSITIONER FOAM (MISCELLANEOUS) ×1 IMPLANT
PAD POSITIONING PINK XL (MISCELLANEOUS) ×1 IMPLANT
PENCIL SMOKE EVACUATOR (MISCELLANEOUS) IMPLANT
PORT ACCESS TROCAR AIRSEAL 12 (TROCAR) IMPLANT
SCISSORS LAP 5X45 EPIX DISP (ENDOMECHANICALS) IMPLANT
SCISSORS MNPLR CVD DVNC XI (INSTRUMENTS) ×1 IMPLANT
SCRUB CHG 4% DYNA-HEX 4OZ (MISCELLANEOUS) ×2 IMPLANT
SEAL UNIV 5-12 XI (MISCELLANEOUS) ×4 IMPLANT
SET TRI-LUMEN FLTR TB AIRSEAL (TUBING) ×1 IMPLANT
SOLN STERILE WATER BTL 1000 ML (IV SOLUTION) ×1 IMPLANT
SPIKE FLUID TRANSFER (MISCELLANEOUS) ×1 IMPLANT
SPONGE T-LAP 18X18 ~~LOC~~+RFID (SPONGE) IMPLANT
SURGIFLO W/THROMBIN 8M KIT (HEMOSTASIS) IMPLANT
SUT MNCRL AB 4-0 PS2 18 (SUTURE) IMPLANT
SUT PDS AB 1 TP1 54 (SUTURE) IMPLANT
SUT VIC AB 0 CT1 27XBRD ANTBC (SUTURE) IMPLANT
SUT VIC AB 2-0 CT1 TAPERPNT 27 (SUTURE) IMPLANT
SUT VIC AB 4-0 PS2 18 (SUTURE) ×2 IMPLANT
SUT VICRYL 0 27 CT2 27 ABS (SUTURE) ×1 IMPLANT
SYR 10ML LL (SYRINGE) IMPLANT
SYSTEM BAG RETRIEVAL 10MM (BASKET) IMPLANT
SYSTEM WOUND ALEXIS 18CM MED (MISCELLANEOUS) IMPLANT
TRAP SPECIMEN MUCUS 40CC (MISCELLANEOUS) IMPLANT
TRAY FOLEY MTR SLVR 16FR STAT (SET/KITS/TRAYS/PACK) ×1 IMPLANT
TROCAR PORT AIRSEAL 5X120 (TROCAR) IMPLANT
TROCAR XCEL NON-BLD 5MMX100MML (ENDOMECHANICALS) IMPLANT
UNDERPAD 30X36 HEAVY ABSORB (UNDERPADS AND DIAPERS) ×2 IMPLANT
YANKAUER SUCT BULB TIP 10FT TU (MISCELLANEOUS) IMPLANT

## 2024-07-04 NOTE — Discharge Instructions (Addendum)
 AFTER SURGERY INSTRUCTIONS   Return to work: 4-6 weeks if applicable  There is a small dressing covering the incision on the left abdomen. Leave this in place for the next 48 hours then you can remove the dressing. You will notice dermabond on the incision underneath.  Wait to resume aspirin  until tomorrow. Do not resume mobic  (meloxicam ) for at least 2 days after surgery.   Activity: 1. Be up and out of the bed during the day.  Take a nap if needed.  You may walk up steps but be careful and use the hand rail.  Stair climbing will tire you more than you think, you may need to stop part way and rest.    2. No lifting or straining for 6 weeks over 10 pounds. No pushing, pulling, straining for 6 weeks.   3. No driving for 4-89 days when the following criteria have been met: Do not drive if you are taking narcotic pain medicine and make sure that your reaction time has returned.    4. You can shower as soon as the next day after surgery. Shower daily.  Use your regular soap and water  (not directly on the incision) and pat your incision(s) dry afterwards; don't rub.  No tub baths or submerging your body in water  until cleared by your surgeon. If you have the soap that was given to you by pre-surgical testing that was used before surgery, you do not need to use it afterwards because this can irritate your incisions.    5. No sexual activity and nothing in the vagina for 4-6 weeks.   6. You may experience a small amount of clear drainage from your incisions, which is normal.  If the drainage persists, increases, or changes color please call the office.   7. Do not use creams, lotions, or ointments such as neosporin on your incisions after surgery until advised by your surgeon because they can cause removal of the dermabond glue on your incisions.     8. Take Tylenol  or ibuprofen  (OR MOBIC , DO NOT TAKE TOGETHER SINCE THEY WORK SIMILARLY) first for pain if you are able to take these medications and  only use narcotic pain medication for severe pain not relieved by the Tylenol  or Ibuprofen  (OR MOBIC ).  Monitor your Tylenol  intake to a max of 4,000 mg in a 24 hour period.    Diet: 1. Low sodium Heart Healthy Diet is recommended but you are cleared to resume your normal (before surgery) diet after your procedure.   2. It is safe to use a laxative, such as Miralax  or Colace, if you have difficulty moving your bowels before surgery. You have been prescribed Sennakot-S to take at bedtime every evening after surgery to keep bowel movements regular and to prevent constipation.     Wound Care: 1. Keep clean and dry.  Shower daily.   Reasons to call the Doctor: Fever - Oral temperature greater than 100.4 degrees Fahrenheit Foul-smelling vaginal discharge Difficulty urinating Nausea and vomiting Increased pain at the site of the incision that is unrelieved with pain medicine. Difficulty breathing with or without chest pain New calf pain especially if only on one side Sudden, continuing increased vaginal bleeding with or without clots.   Contacts: For questions or concerns you should contact:   Dr. [name] at [phone]   Eleanor Epps, NP at [phone]   After Hours: call [phone] and have the GYN Oncologist paged/contacted (after 5 pm or on the weekends). You will speak  with an after hours RN and let he or she know you have had surgery.   Messages sent via mychart are for non-urgent matters and are not responded to after hours so for urgent needs, please call the after hours number.

## 2024-07-04 NOTE — Interval H&P Note (Signed)
 History and Physical Interval Note:  07/04/2024 7:19 AM  Lisa  D [facility]  has presented today for surgery, with the diagnosis of gyn malignancy.  The various methods of treatment have been discussed with the patient and family. After consideration of risks, benefits and other options for treatment, the patient has consented to  Procedures with comments: LYMPHADENECTOMY, PELVIS, ROBOT-ASSISTED (N/A) - ROBOTIC ASSISTED PELVIC LYMPH NODE DEBULKING as a surgical intervention.  The patient's history has been reviewed, patient examined, no change in status, stable for surgery.  I have reviewed the patient's chart and labs.  Questions were answered to the patient's satisfaction.     Nirvana Blanchett

## 2024-07-04 NOTE — Anesthesia Procedure Notes (Signed)
 Procedure Name: Intubation Date/Time: 07/04/2024 9:51 AM  Performed by: Dartha Meckel, CRNAPre-anesthesia Checklist: Patient identified, Emergency Drugs available, Suction available and Patient being monitored Patient Re-evaluated:Patient Re-evaluated prior to induction Oxygen Delivery Method: Circle system utilized Preoxygenation: Pre-oxygenation with 100% oxygen Induction Type: IV induction Ventilation: Mask ventilation without difficulty Laryngoscope Size: Mac and 3 Grade View: Grade I Tube type: Oral Tube size: 7.0 mm Number of attempts: 1 Airway Equipment and Method: Stylet and Oral airway Placement Confirmation: ETT inserted through vocal cords under direct vision, positive ETCO2 and breath sounds checked- equal and bilateral Secured at: 21 cm Tube secured with: Tape Dental Injury: Teeth and Oropharynx as per pre-operative assessment

## 2024-07-04 NOTE — Op Note (Signed)
 GYNECOLOGIC ONCOLOGY OPERATIVE NOTE  Date of Service: 07/04/2024  Preoperative Diagnosis: Gynecologic malignancy, pelvic lymphadenopathy  Postoperative Diagnosis: Same, retroperitoneal fibrosis  Procedures: Robotic assisted laparoscopic bilateral pelvic lymph node debulking  Surgeon: [name], MD  Assistants: Eleanor Epps, NP  Anesthesia: General  Estimated Blood Loss: 25 mL  Fluids: 1300 ml, crystalloid  Urine Output: 100 ml, clear yellow  Findings: On entry to abdomen, upper abdominal survey with smooth diaphragm.  Approximate 1 cm nodule noted to be attached to the liver capsule near the gallbladder fossa, excised.  Otherwise normal-appearing liver, stomach, omentum, and bowel.  Filmy adhesions of the cecum to the right pelvic sidewall.  Filmy adhesions of the rectosigmoid colon to the left pelvic sidewall.  Surgically absent uterus, cervix, bilateral fallopian tubes and ovaries.  Evidence of prior right pelvic lymph node dissection with empty right obturator space and well-healed right obturator repair with suture in place.  Mildly prominent right pelvic lymph node on the external iliac artery, removed.  Left pelvic lymph nodes with several firm and prominent lymph nodes that were not enlarged but removed given appearance.  Enlarged, firm lymph node that was PET positive identified in the left obturator space, removed.  Ultimately, complete left pelvic lymphadenectomy.  No evidence of peritoneal carcinomatosis or ascites.   Specimens:  ID Type Source Tests Collected by Time Destination  1 : Liver Nodule Tissue PATH Gyn biopsy SURGICAL PATHOLOGY Masters Hoy, MD 07/04/2024 1023   2 : Left pelvic lymph nodes Tissue PATH Lymph nodes regional SURGICAL PATHOLOGY Masters Hoy, MD 07/04/2024 1053   3 : Right Pelvic Lymph Nodes Tissue PATH Lymph nodes regional SURGICAL PATHOLOGY Masters Hoy, MD 07/04/2024 1150     Complications:  None  Indications for Procedure: [name] is a 74 y.o. 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  Given that at the time of her prior surgery she had complete response to neoadjuvant chemotherapy and that this is an isolated area of recurrence, recommendation made for secondary debulking.  Prior to the procedure, all risks, benefits, and alternatives were discussed and informed surgical consent was signed.  Procedure: Patient was taken to the operating room where general anesthesia was achieved.  She was positioned in dorsal lithotomy and prepped and draped.  A foley catheter was inserted into the bladder.   A 12 mm incision was made in the left upper quadrant near Palmer's point.  The abdomen was entered with a 5 mm OptiView trocar under direct visualization.  The abdomen was insufflated, the patient placed in steep Trendelenburg, and additional trocars were placed as follows: an 8mm trocar superior to the umbilicus, two 8 mm robotic trocars in the right abdomen, and one 8 mm robotic trocar in the left abdomen.  The left upper quadrant trocar was removed and replaced with a 12 mm airseal trocar.  All trocars were placed under direct visualization.  Trendelenburg was reversed to reevaluate the nodule noted on the liver capsule near the gallbladder fossa.  This nodule was grasped and elevated and excised with electrocautery with monopolar scissors.  The nodule was removed through the assistant trocar.  The patient was placed back in strict Trendelenburg. The bowels were moved into the upper abdomen.  The DaVinci robotic surgical system was brought to the patient's bedside and docked.  Filmy adhesions  of the rectosigmoid colon to the left pelvic sidewall were lysed sharply.  The peritoneum overlying the left pelvic sidewall was incised and the  retroperitoneal space entered.  The left ureter was identified.  The pararectal and paravesical spaces were opened.  Prominent and firm lymph nodes were noted along the external iliac vessels.  The previously identified enlarged and PET positive lymph node was identified in the obturator space.  Given multiple prominent lymph nodes along with the enlarged PET avid lymph node, decision made to proceed with complete left pelvic lymphadenectomy to remove all possible involved lymph nodes. A left pelvic lymphadenectomy was performed using the following boundaries: the bifurcation of the common iliac vessels cephalad, the distal circumflex vein caudad, the genitofemoral nerve laterally, the superior vesical artery medially, and the obturator nerve at the floor of the dissection.  The enlarged obturator lymph node was noted to be densely adherent to the left iliac vein and the obturator nerve.  Meticulous blunt and sharp dissection was performed to mobilize this lymph node off of the surrounding critical structures.  The obturator nerve and extrailiac vein were visualized throughout the dissection and kept far from the area of dissection.  Several smaller lymph nodes were removed the assistant trocar.  The enlarged obturator lymph node was placed in an Endo Catch bag and stored in the upper abdomen for later retrieval.  The right pelvic sidewall was then examined.  A slightly prominent lymph node was noted along the right external iliac artery and decision made to proceed with removal.  The peritoneum overlying the right pelvic sidewall was incised.  The retroperitoneal space was entered.  The lymph node was grasped and elevated off of the external iliac artery.  Using combination of blunt sharp dissection with electrocautery, the lymph node was excised from the external iliac artery and removed the assistant trocar.  At this time, no additional abnormal findings were present.  The pelvis was irrigated.  Surgiflo was  placed in the lymph node dissection beds and hemostasis was achieved. All instruments were removed and the robot was taken from the patient's bedside.  The AirSeal trocar was removed.  The fascia at this site was bluntly stretched with a Burnard.  The Endo Catch bag with the large lymph node was then removed.  The fascia at the 12 mm incision was closed with 0 Vicryl using a PMI device. The abdomen was desufflated and all ports were removed. The skin at all incisions was closed with 4-0 Vicryl to reapproximate the subcutaneous tissue and 4-0 monocryl in a subcuticular fashion followed by surgical glue.  Patient tolerated the procedure well. Sponge, lap, and instrument counts were correct.  No surgical antibiotic prophylaxis was indicated for this procedure.  She was extubated and taken to the PACU in stable condition.  [name], MD Gynecologic Oncology

## 2024-07-04 NOTE — Transfer of Care (Signed)
 Immediate Anesthesia Transfer of Care Note  Patient: Lisa  D Zavala  Procedure(s) Performed: ROBOTIC ASSISTED LAPAROSCOPIC BILATERAL PELVIC LYMPH NODE DEBULKING (Bilateral)  Patient Location: PACU  Anesthesia Type:General  Level of Consciousness: awake and alert   Airway & Oxygen Therapy: Patient Spontanous Breathing and Patient connected to face mask oxygen  Post-op Assessment: Report given to RN and Post -op Vital signs reviewed and stable  Post vital signs: Reviewed and stable  Last Vitals:  Vitals Value Taken Time  BP 156/93 07/04/24 12:40  Temp    Pulse 63 07/04/24 12:41  Resp 15 07/04/24 12:41  SpO2 100 % 07/04/24 12:41  Vitals shown include unfiled device data.  Last Pain:  Vitals:   07/04/24 0712  TempSrc: Oral  PainSc: 0-No pain         Complications: No notable events documented.

## 2024-07-05 ENCOUNTER — Telehealth: Payer: Self-pay | Admitting: *Deleted

## 2024-07-05 NOTE — Anesthesia Postprocedure Evaluation (Signed)
"   Anesthesia Post Note  Patient: Lisa  D Zavala  Procedure(s) Performed: ROBOTIC ASSISTED LAPAROSCOPIC BILATERAL PELVIC LYMPH NODE DEBULKING (Bilateral)     Patient location during evaluation: PACU Anesthesia Type: General Level of consciousness: awake Pain management: pain level controlled Vital Signs Assessment: post-procedure vital signs reviewed and stable Respiratory status: spontaneous breathing, nonlabored ventilation and respiratory function stable Cardiovascular status: blood pressure returned to baseline and stable Postop Assessment: no apparent nausea or vomiting Anesthetic complications: no   No notable events documented.  Last Vitals:  Vitals:   07/04/24 1330 07/04/24 1337  BP: 126/64 (!) 126/94  Pulse: (!) 55 (!) 51  Resp: 10 12  Temp: 36.8 C   SpO2: 94% 97%    Last Pain:  Vitals:   07/04/24 1337  TempSrc:   PainSc: 3                  Davonne Jarnigan P Duval Macleod      "

## 2024-07-05 NOTE — Telephone Encounter (Signed)
 Spoke with [name] this morning. She states she is eating, drinking and urinating well. She has not had a BM yet but is passing gas. She is taking senokot as prescribed and encouraged her to drink plenty of water . She denies fever or chills. Incisions are dry and intact. She rates her pain 4/10. Her pain is controlled with alternating tylenol  and advil .     Instructed to call office with any fever, chills, purulent drainage, uncontrolled pain or any other questions or concerns. Patient verbalizes understanding.   Pt aware of post op appointments as well as the office number [phone] and after hours number [phone] to call if she has any questions or concerns

## 2024-07-07 ENCOUNTER — Encounter (HOSPITAL_COMMUNITY): Admission: RE | Admit: 2024-07-07 | Source: Ambulatory Visit

## 2024-07-07 ENCOUNTER — Inpatient Hospital Stay

## 2024-07-07 ENCOUNTER — Ambulatory Visit

## 2024-07-10 LAB — SURGICAL PATHOLOGY

## 2024-07-11 ENCOUNTER — Ambulatory Visit: Admitting: Physical Therapy

## 2024-07-11 DIAGNOSIS — C55 Malignant neoplasm of uterus, part unspecified: Secondary | ICD-10-CM

## 2024-07-11 DIAGNOSIS — R590 Localized enlarged lymph nodes: Secondary | ICD-10-CM

## 2024-07-12 ENCOUNTER — Encounter: Payer: Self-pay | Admitting: Oncology

## 2024-07-12 NOTE — Progress Notes (Signed)
 Sent order requisition to [facility] for NGS/RNA and Tumor origin testing per Dr. Eldonna.

## 2024-07-14 ENCOUNTER — Ambulatory Visit

## 2024-07-14 ENCOUNTER — Ambulatory Visit: Attending: Gynecologic Oncology

## 2024-07-14 DIAGNOSIS — R262 Difficulty in walking, not elsewhere classified: Secondary | ICD-10-CM | POA: Insufficient documentation

## 2024-07-14 DIAGNOSIS — C549 Malignant neoplasm of corpus uteri, unspecified: Secondary | ICD-10-CM | POA: Diagnosis present

## 2024-07-14 DIAGNOSIS — I890 Lymphedema, not elsewhere classified: Secondary | ICD-10-CM | POA: Diagnosis present

## 2024-07-14 DIAGNOSIS — M6281 Muscle weakness (generalized): Secondary | ICD-10-CM | POA: Insufficient documentation

## 2024-07-14 NOTE — Therapy (Signed)
 " OUTPATIENT PHYSICAL THERAPY  LOWER EXTREMITY ONCOLOGY TREATMENT  Patient Name: [name] MRN: [id number] DOB:11/04/1950, 74 y.o., female Today's Date: 07/14/2024  END OF SESSION:  PT End of Session - 07/14/24 1010     Visit Number 6    Number of Visits 17    Date for Recertification  08/01/24    PT Start Time 1004    PT Stop Time 1107    PT Time Calculation (min) 63 min    Activity Tolerance Patient tolerated treatment well    Behavior During Therapy WFL for tasks assessed/performed          Past Medical History:  Diagnosis Date   Anemia    hx of   Arthritis    Cataract    Complication of anesthesia    SPINAL  -  CONVULSIONS (PLACED IN WRONG PLACE)   Complication of anesthesia    show to wake up   Degenerative disc disease    Diabetes mellitus without complication (HCC)    Family history of kidney cancer    Family history of uterine cancer    Hx of blood clots    related to cancer   Hypothyroidism    Lymphedema    RLE  d/t surgery   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   Left Inner Eye(MOH's)   PONV (postoperative nausea and vomiting)    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   Top of Left Hand(Tx p Bx)   Past Surgical History:  Procedure Laterality Date   CARPAL TUNNEL RELEASE     BILAT     CATARACT EXTRACTION     BILAT    ECTOPIC PREGNANCY SURGERY     in the setting of prior BTL, lapartomy   IR IMAGING GUIDED PORT INSERTION  12/02/2022   KNEE CARTILAGE SURGERY     RIGHT    (MENISCUS)   LAPAROTOMY N/A 02/16/2023   Procedure: MINI LAPAROTOMY;  Surgeon: Lisa Mays, MD;  Location: [facility];  Service: Gynecology;  Laterality: N/A;   LYMPH NODE DISSECTION Right 02/16/2023   Procedure: RIGHT PELVIC LYMPH NODE DISSECTION;  Surgeon: Lisa Mays, MD;  Location: [facility];  Service: Gynecology;  Laterality: Right;   MOHS SURGERY     left nose/inner eye for basal cell   TONSILLECTOMY     TOTAL HIP ARTHROPLASTY Left  12/19/2015   Procedure: LEFT TOTAL HIP ARTHROPLASTY;  Surgeon: [name] Right, MD;  Location: [facility];  Service: Orthopedics;  Laterality: Left;   TRIGGER FINGER RELEASE     TUBAL LIGATION     Patient Active Problem List   Diagnosis Date Noted   Pelvic lymphadenopathy 07/04/2024   Insulin  dependent type 1 diabetes mellitus (HCC) 02/08/2024   Menopausal symptoms 02/08/2024   DKA (diabetic ketoacidosis) (HCC) 10/31/2023   Colitis 10/31/2023   Anemia due to antineoplastic chemotherapy 08/27/2023   Rash and nonspecific skin eruption 08/27/2023   Abdominal pain 06/01/2023   Peripheral neuropathy due to chemotherapy 03/19/2023   Family history of uterine cancer    Family history of kidney cancer    Hot flashes 01/15/2023   Multiple thyroid  nodules 12/21/2022   Acute deep vein thrombosis (DVT) of right lower extremity (HCC) 11/20/2022   Mild anemia 11/20/2022   Uterine cancer (HCC) 11/19/2022   Unilateral primary osteoarthritis, left knee 08/05/2022   Unilateral primary osteoarthritis, right knee 08/05/2022   Bilateral primary osteoarthritis of knee 05/06/2022   Primary osteoarthritis of left hip 12/19/2015   Status post total replacement  of left hip 12/19/2015   Hypothyroidism due to Hashimoto's thyroiditis 10/15/2015   Degenerative disc disease    Arthritis     PCP: Lisa Ditty, MD  REFERRING PROVIDER: Eleanor Epps, NP  REFERRING DIAG:  C54.9 (ICD-10-CM) - Malignant neoplasm of body of uterus, unspecified site (HCC) R60.0 (ICD-10-CM) - Lower extremity edema  THERAPY DIAG:  Lymphedema, not elsewhere classified  Muscle weakness (generalized)  Difficulty in walking, not elsewhere classified  Malignant neoplasm of body of uterus, unspecified site [facility])  ONSET DATE: 02/16/23  Rationale for Evaluation and Treatment: Rehabilitation  SUBJECTIVE:                                                                                                                                                                                            SUBJECTIVE STATEMENT:I had my pelvic lymphadenectomy. They removed 4 LN's from the Lt and ! LN from the Rt. They came back cancerous but they haven't found the source yet. They also biopsied my liver ad that came back clear. Due to that I can't lift or strain for 6 weeks. So I've been wearing my garments some as able since surgery. I have started noticing some increased edema, more proximal at my inner thigh only. I've been wearing th TG soft some for that.   PERTINENT HISTORY: Underwent total hysterectomy and bilateral salpingo oophorectomy with omenectomy on 02/16/23, Uterine cancer 10/23/22 with pelvic lymphadenopathy did neoadj chemo, DVT R LE, needs R TKA, type 1 diabetes from Keytruda , has recurrence in L inguinal nodes and is planning on undergoing dissection of these nodes  PAIN:  PAIN:  Are you having pain? Yes NPRS scale: 5/10 - up to 8/10 at night Pain location: thigh Pain orientation: Left, Medial, and Upper  PAIN TYPE: aching Pain description: intermittent  Aggravating factors: hurts every night when I lay down, no relief when I lay down in any position Relieving factors: getting up and moving   PRECAUTIONS: Other: L THA 2017, active cancer   WEIGHT BEARING RESTRICTIONS: No   FALLS:  Has patient fallen in last 6 months? no   LIVING ENVIRONMENT: Lives with: lives with their spouse, [name] Lives in: House/apartment Stairs: Yes; Internal: 14 steps; on right going up Has following equipment at home: Single point cane, Walker - 2 wheeled, Crutches, and bed side commode   OCCUPATION: part time, administrative in a school    LEISURE: walks daily for at least 30 min, was playing tennis 3x/wk   HAND DOMINANCE: right    PRIOR LEVEL OF FUNCTION: Independent   PATIENT GOALS: to get in and out of the car without assisting leg,  get rid of the swelling, control the swelling, improve mobility     OBJECTIVE:    COGNITION: Overall cognitive status: Within functional limits for tasks assessed      OBSERVATIONS / OTHER ASSESSMENTS: fullness visible throughout R LE especially at R thigh   POSTURE: forward head and rounded shoulders     LYMPHEDEMA ASSESSMENTS:    LOWER EXTREMITY LANDMARK RIGHT 02/11/23 RIGHT  06/06/24 Right 06/12/24 Right 06/21/24 Right 07/14/24  At groin        30 cm proximal to suprapatella   60.3 57.9 56.4 59   20 cm proximal to suprapatella 61.6 58.1 53.7 53.6 55.7  10 cm proximal to suprapatella 51 49 45.5 45.8 45.7  At midpatella / popliteal crease 40.9 39.3 38.1 37.1 39.8  30 cm proximal to floor at lateral plantar foot 37.6 38.2 34.6 34.9 35.4  20 cm proximal to floor at lateral plantar foot 32.3 32 29.1 29.1 28.8  10 cm proximal to floor at lateral plantar foot 21.8 22.4 21.1 21.1 21.2  Circumference of ankle/heel        5 cm proximal to 1st MTP joint 22.5 21.6 21.4 21.8 21.4  Across MTP joint 22.2 22.8 20.2 20.8 20.6  Around proximal great toe 7.5 7.8 7.4 7.8 6.9  (Blank rows = not tested)   LOWER EXTREMITY LANDMARK LEFT 02/11/23 LEFT 06/06/24 Left  07/14/24  At groin      30 cm proximal to suprapatella   58 52.8  20 cm proximal to suprapatella 54.5 54 50.5  10 cm proximal to suprapatella 47 45.5 41.8  At midpatella / popliteal crease 39.9 36 36.5  30 cm proximal to floor at lateral plantar foot 37 36.9 33.8  20 cm proximal to floor at lateral plantar foot 29.4 28.5 26.2  10 cm proximal to floor at lateral plantar foot 21.4 21.5 20  Circumference of ankle/heel      5 cm proximal to 1st MTP joint 21.4 21.5 21.3  Across MTP joint 22 21.5 21.1  Around proximal great toe 7.4 7 6.5  (Blank rows = not tested)    LOWER EXTREMITY MMT:  MMT Right eval  Hip flexion 5/5  Hip extension   Hip abduction   Hip adduction 3/5  Hip internal rotation   Hip external rotation   Knee flexion 5/5  Knee extension 5/5  Ankle dorsiflexion 5/5  Ankle plantarflexion    Ankle inversion   Ankle eversion    (Blank rows = not tested)  MMT LEFT eval  Hip flexion 5/5  Hip extension   Hip abduction   Hip adduction 4/5  Hip internal rotation   Hip external rotation   Knee flexion 5/5  Knee extension 5/5  Ankle dorsiflexion 5/5  Ankle plantarflexion   Ankle inversion   Ankle eversion    (Blank rows = not tested)    LLIS:  Flowsheet Row Outpatient Rehab from 06/06/2024 in [facility]  Lymphedema Life Impact Scale Total Score 50 %   Lower Extremity Functional Score: 64 / 80 = 80.0 %  TREATMENT DATE:  07/14/24: Self Care Saddie Raw, PT, present for part of treatment for brief reassess since pelvic lymphadenectomy done since last session. Discussed pts current functional status since surgery. She reports noticing her Rt LE has been more swollen in the thigh as she has not been able to wear her compression consistently yet due to MD restrictions to avoid strain and she has to strain to get her garments on. She has also been having trouble sleeping so has not been able to wear the nighttime garment much. Her circumference measurements were increased at her Rt thigh, not surprisingly so. Her Lt LE though is reduced circumferentially since surgery, though pt reports she has been noticing some inner thigh swelling on Lt and she has been having some shooting pain. Encouraged her that this is probable due to the surgery but she will address this with surgeon at next post op visit. She will also bring to attention new palpable lump at Rt posterior medial thigh. Discussed possibility of wearing bike shorts to help with thigh and mild abdominal swelling she has been noticing. Pt has also been wearing her TG soft on the Lt and feels relief with this so advised her that it's ok to wear this.  Manual Therapy MLD to Rt LE: Short  neck, 5 (unresisted) diaphragmatic breaths, Rt axillary nodes, Rt inguino-axillary anastomosis, then Rt LE to shin (only briefly here though) working from distal to proximal and retracing all steps spending most of time at thigh where all of lymphedema is.  Made a chip pack that pt could wear at her lower abdomen under her underwear for abdominal swelling she has been noticing after removing her LE garments.   06/21/24: Orthotic Fit Training Pt brought her new day and nighttime compression garment and spent most of session instructing pt in proper donning and doffing of both. Also instructed her in use of slippy gator with day time garment. She was able to return excellent demo and verbalize good understanding of proper fit. Both garments fit well and pt reports these feeling good as well.  Manual Therapy Circumference measurements taken.  MLD review with pt performing on herself mirroring therapist demo and PTA offering hand over hand cuing prn for correct pressure and directionality of skin stretches. Handout issued as well for further reinforcement.   06/14/24:  Orthotic Fit: Discussed flat knit vs circular knit and measured pt for flat knit day time garment and night time garment for management of lymphedema. Pt fit in to a Exo Strong Thigh High Flat knit off the shelf with silicone band to keep it from sliding size Medium, Average Length. For the night time garment she fit in to an off the shelf Circaid Profile Thigh High Size III - short with energy over sleeve for additional compression with blue giraffe print.  Manual Therapy: Removed bandages at beginning of session Lotion from foot to thigh then TG soft to knee to groin, and then to foot up to knee: Artiflex from foot to below pop fossa , 1 6 cm at foot in combined Roman sandal and ASH pattern, 1 8 cm spiral from ankle to knee, 1 10 cm from ankle to knee in figure 8, folded knee high stockinette over top of bandage, then applied artiflex  from knee with extra at pop fossa, to groin; 1 12 cm from just below knee to mid thigh with X behind knee then 12 cm at knee to groin in spiral with TG soft folded over.  PATIENT EDUCATION:  Education details:don't keep bandages on more than 2 days, re wrap when they get loose, remove if there is increased pain, swelling in toes, turns blue, numbness and tingling that does not go away when you move Person educated: Patient Education method: Explanation Education comprehension: verbalized understanding  HOME EXERCISE PROGRAM: None yet  ASSESSMENT:  CLINICAL IMPRESSION: Pt returns since her pelvic lymphadenectomy on 07/04/24. Assessed her with Saddie Raw, PT and she determined ok to cont MLD but to avoid any abdominal pressure due to her healing incisions across abdomen. See above for current functional status. After review and discussion today pt feels ready for D/C. She is independent with self MLD and is getting back to getting her garments on as she recovers from surgery. She will speak to MD about her Lt LE and issued new TG soft for her to wear on Lt LE prn. Pt has met her goals and is ready for D/C at this time. She knows she can return at any time with a new MD referral.   OBJECTIVE IMPAIRMENTS: decreased knowledge of condition, decreased knowledge of use of DME, decreased mobility, difficulty walking, decreased ROM, decreased strength, increased edema, impaired flexibility, and pain.   ACTIVITY LIMITATIONS: squatting, stairs, bed mobility, and locomotion level  PARTICIPATION LIMITATIONS: community activity  PERSONAL FACTORS: Time since onset of injury/illness/exacerbation and 1 comorbidity: diabetes are also affecting patient's functional outcome.   REHAB POTENTIAL: Good  CLINICAL DECISION MAKING: Evolving/moderate complexity  EVALUATION COMPLEXITY: Moderate   GOALS: Goals reviewed with patient? Yes  SHORT TERM GOALS: Target date: 07/04/24  Pt will be independent in  compression bandaging for long term management of lymphedema.  Baseline: Goal status:MET  2.  Pt will demonstrate a 1 cm decrease in edema 20 cm superior to suprapatella to decrease risk of infection.  Baseline:  Goal status: MET  3.  Pt will be independent in initial HEP for LE strengthening/stretching.  Baseline: Did not address this as lymphedema was primary concern before surgery and now pt does not feel limited by Rt LE weakness Goal status: DEFERRED   LONG TERM GOALS: Target date: 08/01/2024   Pt will demonstrate a 2 cm decrease in edema 20 cm superior to suprapatella to decrease risk of infection.  Baseline: 06/21/24, 07/14/24 - pt increased today but this is due to not being able to wear consistent compression since surgery, she knows how to get reduced Goal status: MET  2.  Pt will obtain appropriate compression garments for long term management of RLE lymphedema. Baseline:  Goal status: MET  3.  Pt will report she is able to get in and out of the car without lifting her leg Baseline:  Goal status: MET  4.  Pt will be able to lie comfortably in bed on her back without increased pain.  Baseline: 07/14/24 - no problems with Rt LE, now has pain with supine with LT LE but this is probable due to recent surgery and she will speak to her surgeon about this Goal status: MET - before recent surgery  5.  Pt will demonstrate 3+/5 strength in R hip adductors to allow improved mobility.  Baseline: 07/14/24 - WNL's, pt reports no limitations with ADLs other than from recent surgery Goal status:MET   PLAN:  PT FREQUENCY: 2x/week  PT DURATION: 8 weeks  PLANNED INTERVENTIONS: [id number]- PT Re-evaluation, 97110-Therapeutic exercises, 97530- Therapeutic activity, 97112- Neuromuscular re-education, 97535- Self Care, [id number]- Manual therapy, [id number]- Orthotic Initial, [id number]- Orthotic/Prosthetic subsequent, Patient/Family education, Balance training,  Joint mobilization, Therapeutic exercises,  Therapeutic activity, Neuromuscular re-education, Gait training, and Self Care  PLAN FOR NEXT SESSION: D/C this session.    Aden Berwyn Caldron, PTA 07/14/2024, 12:19 PM  Start with circles near neck placing opposite hand behind collarbone. Then direction of circle is straight back and in towards neck. (Make sure Rt hand is behind port line, not on)  Deep Effective Breath   Standing, sitting, or laying down place both hands on the belly. Take a deep breath IN, expanding the belly; then breath OUT, contracting the belly. Repeat __5__ times. Do __2-3__ sessions per day and before each self massage.   Inguinal Nodes to Axilla - Clear   On involved side, at armpit, make _5__ in-place circles. Then from hip proceed in sections to armpit with stationary circles or pumps _5_ times, this is your pathway. Do _1__ time per day.  Copyright  VHI. All rights reserved.  LEG: Knee to Hip - Clear   Pump up outer thigh of involved leg from knee to outer hip. Then do stationary circles from inner to outer thigh, then do outer thigh again. Next, interlace fingers behind knee and make in-place circles. Do _5_ times of each sequence.  Do _1__ time per day.  Copyright  VHI. All rights reserved.  LEG: Ankle to Hip Sweep   Hands on sides of ankle of involved leg, pump _5__ times up both sides of lower leg. Do _2-3_ times. Do __1_ time per day.  Copyright  VHI. All rights reserved.  FOOT: Dorsum of Foot and Toes Massage   One hand on top of foot make _5_ stationary circles or pumps, then either on top of toes or each individual toe do _5_ pumps. Then retrace all steps pumping back up both sides of lower leg, outer thigh, and then pathway. Finish with what you started with, _5_ circles at involved side arm pit. All _2-3_ times at each sequence. Do _1__ time per day. "

## 2024-07-18 ENCOUNTER — Telehealth: Payer: Self-pay | Admitting: Psychiatry

## 2024-07-18 ENCOUNTER — Ambulatory Visit

## 2024-07-18 ENCOUNTER — Telehealth: Payer: Self-pay | Admitting: Oncology

## 2024-07-18 ENCOUNTER — Telehealth: Payer: Self-pay | Admitting: Pharmacy Technician

## 2024-07-18 ENCOUNTER — Other Ambulatory Visit (HOSPITAL_COMMUNITY): Payer: Self-pay

## 2024-07-18 NOTE — Telephone Encounter (Signed)
 Left a message with apt to see Dr. Lonn on 07/28/24 at 1:00. Requested a return call.

## 2024-07-18 NOTE — Telephone Encounter (Signed)
 Pharmacy Patient Advocate Encounter   Received notification from [facility] that prior authorization for FreeStyle Libre 3 Plus Sensor  is required/requested.   Insurance verification completed.   The patient is insured through [employer].    Per test claim, medication is not covered under Part D. It has to be billed under Part B. PA not submitted at this time. Archived key: [id number]

## 2024-07-18 NOTE — Telephone Encounter (Signed)
 Called pt with pathology results. Tumor origin testing requested. Pt reports that she is overall recovering well from surgery. But does note left leg pain at night. Reviewed that one node was adherent to her left obturator nerve but this was visualized throughout surgery and intact. However, she may have some temporary neuropathy from manipulation of this nerve. She has increased her gaba at night from 100mg  to 300mg  which helped last night. Also taking tylenol . Can increase gaba further if desires. She may trial 400mg  or 500mg  if needed. Only taking at night. Keep postop visit on 07/24/24 as scheduled.

## 2024-07-21 ENCOUNTER — Ambulatory Visit: Admitting: Physical Therapy

## 2024-07-24 ENCOUNTER — Other Ambulatory Visit: Payer: Self-pay | Admitting: Oncology

## 2024-07-24 ENCOUNTER — Inpatient Hospital Stay: Admitting: Psychiatry

## 2024-07-24 DIAGNOSIS — C549 Malignant neoplasm of corpus uteri, unspecified: Secondary | ICD-10-CM

## 2024-07-24 NOTE — Progress Notes (Signed)
 Gynecologic Oncology Multi-Disciplinary Disposition Conference Note  Date of the Conference: 07/24/2024  Patient Name: [name]  Referring Provider: Dr. Lavoie Primary GYN Oncologist: Dr. Eldonna   Stage/Disposition:  Recurrent gyn carcinoma. Disposition is to chemotherapy with carboplatin /Taxol  with consideration for trastuzumab.   This Multidisciplinary conference took place involving physicians from Gynecologic Oncology, Medical Oncology, Radiation Oncology, Pathology, Radiology along with the Gynecologic Oncology Nurse Practitioner and Gynecologic Oncology Nurse Navigator.  Comprehensive assessment of the patient's malignancy, staging, need for surgery, chemotherapy, radiation therapy, and need for further testing were reviewed. Supportive measures, both inpatient and following discharge were also discussed. The recommended plan of care is documented. Greater than 35 minutes were spent correlating and coordinating this patient's care.

## 2024-07-25 ENCOUNTER — Inpatient Hospital Stay: Attending: Gynecologic Oncology | Admitting: Psychiatry

## 2024-07-25 ENCOUNTER — Encounter: Payer: Self-pay | Admitting: Psychiatry

## 2024-07-25 ENCOUNTER — Encounter: Payer: Self-pay | Admitting: Gynecologic Oncology

## 2024-07-25 ENCOUNTER — Encounter: Payer: Self-pay | Admitting: Hematology and Oncology

## 2024-07-25 ENCOUNTER — Inpatient Hospital Stay

## 2024-07-25 ENCOUNTER — Ambulatory Visit: Admitting: Physical Therapy

## 2024-07-25 VITALS — BP 126/70 | HR 79 | Temp 98.5°F | Resp 16 | Wt 138.0 lb

## 2024-07-25 DIAGNOSIS — M792 Neuralgia and neuritis, unspecified: Secondary | ICD-10-CM | POA: Diagnosis not present

## 2024-07-25 DIAGNOSIS — C775 Secondary and unspecified malignant neoplasm of intrapelvic lymph nodes: Secondary | ICD-10-CM | POA: Insufficient documentation

## 2024-07-25 DIAGNOSIS — C579 Malignant neoplasm of female genital organ, unspecified: Secondary | ICD-10-CM | POA: Insufficient documentation

## 2024-07-25 DIAGNOSIS — Z9889 Other specified postprocedural states: Secondary | ICD-10-CM | POA: Insufficient documentation

## 2024-07-25 DIAGNOSIS — Z7189 Other specified counseling: Secondary | ICD-10-CM

## 2024-07-25 DIAGNOSIS — R590 Localized enlarged lymph nodes: Secondary | ICD-10-CM

## 2024-07-25 DIAGNOSIS — C801 Malignant (primary) neoplasm, unspecified: Secondary | ICD-10-CM

## 2024-07-25 DIAGNOSIS — R600 Localized edema: Secondary | ICD-10-CM

## 2024-07-25 DIAGNOSIS — Z9221 Personal history of antineoplastic chemotherapy: Secondary | ICD-10-CM | POA: Insufficient documentation

## 2024-07-25 MED ORDER — PREGABALIN 25 MG PO CAPS: 25.0000 mg | ORAL_CAPSULE | Freq: Two times a day (BID) | ORAL | 1 refills | Status: DC

## 2024-07-25 NOTE — Progress Notes (Signed)
 This encounter was created in error - please disregard.

## 2024-07-25 NOTE — Patient Instructions (Signed)
 It was a pleasure to see you in clinic today. - Healing well - Will let you know the tumor origin testing results and urine cytology - You can try the lyrica  instead of the gabapentin . Do not take both at the same time. You can try one tablet (25mg ) and increase to two tablets (50mg ) if doing well.  Thank you very much for allowing me to provide care for you today.  I appreciate your confidence in choosing our Gynecologic Oncology team at [facility].  If you have any questions about your visit today please call our office or send us  a MyChart message and we will get back to you as soon as possible.

## 2024-07-25 NOTE — Progress Notes (Signed)
 Gynecologic Oncology Return Clinic Visit  Date of Service: 07/25/2024 Referring Provider: Marie-Lyne Lavoie, MD  [facility]  Assessment & Plan: [name]  [name] is a 74 y.o. 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 Recurrence diagnosed 04/2024 with enlarging and PET positive left pelvic lymph nodes. She presents today for postop evaluation s/p RA bilateral pelvic lymph node debulking on 07/04/24.  Postop: - Pt recovering well from surgery and healing appropriately postoperatively - Ongoing postoperative expectations reviewed.  Okay to resume normal activities and play tennis.  Gyn malignancy: - Completed adjuvant chemo followed by maintenance pembro, Lisa/c'ed due to IRAE diabetes. - CT a/p on 05/09/24 completed due to pelvic symptoms. Noted a left external iliac lymph node enlarged in size. Subsequent PET with avidity in this location suggestive of isolated recurrence. - S/p genetics consult - opted to defer testing - Prior molecular testing/NGS:             >> TMB high, HRD neg, MSS             >> Mutations: ERBB2 amplification equivocal, ARID1A, NF2, TP53             >> FOLR1 neg - Intraoperative findings and pathology results reviewed. - This specimen with HER2 3+ - Tumor origin testing sent - Will collect urine cytology to rule out urothelial cancer given this was still on the differential per the IHC stains, although per tumor board discussion this still most closely looks like high grade serous - Follow-up with Dr. Lonn to discuss resuming systemic chemo. Given HER2 3+, could consider carb/tax/herceptin versus enhertu. Otherwise, will follow-up tumor origin to see if this helps to illuminate  other considerations for next line of therapy if can clarify between uterine or tubo-ovarian origin versus non gynecologic  Right groin pain, obturator nerve injury and repair, left neuropathic pain: - Improved with resumption of PT, right leg sleeve -Reviewed that the left pain may be due to stretch from the obturator nerve removing involved lymph nodes but obturator nerve was well-visualized throughout the case and intact.  She does have some additional pain that runs down past the obturator nerve distribution so there may be some sciatic nerve inflammation as well. - Patient previously tried Cymbalta  and did not like it.  Has been taking 200-300 mg of gabapentin  nightly.  Discussed we could try Lyrica  and see if this works any better for her.  Patient open to this. - Prescription for Lyrica  25 mg twice daily sent.  She can start with this and if tolerating well can increase to 50 mg.  Patient instructed to hold gabapentin  while trialing the Lyrica .  Lymphedema/lower extremity edema: - Stable - PT as above   Hotflashes/nightsweats/poor sleep - Using gabapentin  200-300 mg nightly which seems to help some.  Also tylenol  PM. Continue.  Will trial Lyrica  as above. - Pt was started on vivelle  dot 0.05mg /24hr by Dr. Lonn (tumor ER neg). Has self discontinued.    T1DM - Currently on lantus  9u qAM/4u at bedtime and Novolog  4-6u TID.  RTC after completion of therapy.  [name], MD Gynecologic Oncology   Medical Decision Making I personally spent  TOTAL 30 minutes face-to-face and non-face-to-face in the care of this patient, which includes all pre, intra, and  post visit time on the date of service. The discussion of treatment of gyn malignancy is beyond the scope of routine postoperative care.   ----------------------- Reason for Visit: Postop/Treatment discussion  Treatment History: Oncology History Overview Note  Er neg, MSI stable, PD-L1 80% No genetic testing done, did not  meet criteria   Uterine cancer (HCC)  10/23/2022 Imaging   1. 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 2. Multiple prominent palpable lymph nodes in the right groin measuring up to 2.1 cm, nonspecific. 3. Small right popliteal fossa Baker's cyst.   10/25/2022 Imaging   Ct imaging 1. Pelvic lymphadenopathy with the most prominent lymph node along the right iliac chain measuring 5.6 x 2.8 cm. Metastatic lymphadenopathy or lymphoproliferative disease cannot be excluded.  2. 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 3. Prominent right inguinal lymph nodes. 4. 8 mm sclerotic bone lesion within the right ischium, indeterminate. 5. Aortic atherosclerosis.   Aortic Atherosclerosis (ICD10-I70.0).       11/11/2022 Pathology Results   SURGICAL PATHOLOGY CASE: [id number] PATIENT: [name]  [facility] Report  Specimen Submitted: A. Lymph node, right pelvic  Clinical History: Pelvic lymphadenopathy  DIAGNOSIS: A. LYMPH NODE, RIGHT PELVIC; CT-GUIDED CORE NEEDLE BIOPSY: - METASTATIC POORLY DIFFERENTIATED CARCINOMA, SEE COMMENT.  Comment: Immunohistochemical studies show tumor cells to be positive for CK7, MOC-31, GATA-3, CK5/6, and calretinin (subset). P16 is strongly and diffusely positive. P53 demonstrates an absence of staining within tumor cells, indicative of a null staining pattern. PAX-8, WT-1, D240, TTF-1, cdx-2, and p40 are negative. The pattern of immunohistochemical staining is non-specific. Based on the morphologic features and pattern of immunohistochemical staining the differential diagnosis includes metastatic urothelial carcinoma, metastatic breast carcinoma, and metastatic serous carcinoma of gyn origin. Correlation with radiographic findings is  required.  There is sufficient tissue present for ancillary molecular testing.  IHC slides were prepared by [facility] for Molecular Biology and Pathology, [place], [place]. All controls stained appropriately.   11/16/2022 PET scan   1. Examination is positive for tracer avid adenopathy within the retroperitoneum, bilateral pelvis and right inguinal region. Findings are compatible with nodal metastasis. Primary neoplasm is not apparent on the current exam.  2. No signs of solid organ metastasis within the abdomen or pelvis. 3. No signs of tracer avid disease above the diaphragm. 4. Asymmetric subcutaneous edema is identified involving the visualized portions of the right lower extremity. 5. 5 mm perifissural nodule in the left mid lung is too small to characterize by PET-CT. 6. Increased uptake within both lobes of thyroid  gland, right greater than left. Correlation with thyroid  function tests advised. 7.  Aortic Atherosclerosis (ICD10-I70.0).   11/19/2022 Initial Diagnosis   Gynecologic malignancy (HCC)   11/20/2022 Cancer Staging   Staging form: Corpus Uteri - Carcinoma and Carcinosarcoma, AJCC 8th Edition - Clinical stage from 11/20/2022: FIGO Stage IVB (cTX, cN2a, pM1) - Signed by Lonn Hicks, MD on 11/20/2022 Stage prefix: Initial diagnosis   12/02/2022 Procedure   Ultrasound and fluoroscopically guided right internal jugular single lumen power port catheter insertion. Tip in the SVC/RA junction. Catheter ready for use.   12/04/2022 - 12/04/2022 Chemotherapy   Patient is on Treatment Plan : UTERINE Carboplatin  AUC 6 + Paclitaxel  q21d     12/04/2022 - 10/19/2023 Chemotherapy   Patient is on Treatment Plan : UTERINE Pembrolizumab  (200), Paclitaxel  (  175), Carboplatin  (5) q21d x 6 cycles / Pembrolizumab  (400) q42d     12/15/2022 Imaging   1. No adnexal mass. No uterine mass lesion evident and there is no substantial thickening of the endometrium by MRI. Low signal intensity fibrous stroma of the  cervix appears preserved although high signal intensity of the cervical mucosa is somewhat prominent. This could be correlated with Pap smear as clinically warranted. 2. Stable right pelvic sidewall and groin lymphadenopathy. 3. Small Bartholin's cysts bilaterally.   02/05/2023 Imaging   CT CHEST ABDOMEN PELVIS W CONTRAST  Result Date: 02/05/2023 CLINICAL DATA:  History of endometrial cancer, monitor. High-risk. * Tracking Code: BO * EXAM: CT CHEST, ABDOMEN, AND PELVIS WITH CONTRAST TECHNIQUE: Multidetector CT imaging of the chest, abdomen and pelvis was performed following the standard protocol during bolus administration of intravenous contrast. RADIATION DOSE REDUCTION: This exam was performed according to the departmental dose-optimization program which includes automated exposure control, adjustment of the mA and/or kV according to patient size and/or use of iterative reconstruction technique. CONTRAST:  OMNIPAQUE  IOHEXOL  300 MG/ML  SOLN COMPARISON:  Multiple priors including MRI pelvis December 15, 2022 PET-CT Nov 16, 2022 and CT abdomen pelvis October 25, 2022. FINDINGS: CT CHEST FINDINGS Cardiovascular: Right chest wall Port-A-Cath with the tip in the right atrium. Aortic atherosclerosis. Normal caliber thoracic aorta. No central pulmonary embolus on this nondedicated study. Normal size heart. No significant pericardial effusion/thickening Mediastinum/Nodes: No suspicious thyroid  nodule. No pathologically enlarged mediastinal, hilar or axillary lymph nodes the esophagus is grossly unremarkable. Lungs/Pleura: Stable scattered tiny pulmonary nodules. For instance: -2 mm right upper lobe pulmonary nodule on image 29/3, unchanged -2 mm pulmonary nodule along the right major fissure on image 73/3, unchanged -4 mm pulmonary nodule along the left major fissure on image 73/3, unchanged. No pleural effusion.  No pneumothorax. Musculoskeletal: No suspicious chest wall lesion. No aggressive lytic or blastic lesion  of bone. CT ABDOMEN PELVIS FINDINGS Hepatobiliary: Diffuse hepatic steatosis. Gallbladder is unremarkable. No biliary ductal dilation Pancreas: No pancreatic ductal dilation or evidence of acute inflammation Spleen: No splenomegaly. Adrenals/Urinary Tract: Bilateral adrenal glands appear normal. No hydronephrosis. Kidneys demonstrate symmetric enhancement. No suspicious renal mass. Urinary bladder is not well evaluated due to underdistention and streak artifact from left hip arthroplasty. Stomach/Bowel: No radiopaque enteric contrast material was administered. Hyperdensity along the posterior aspect of the stomach on image 53/2 appears similar on delayed imaging and is compatible with ingested material. No pathologic dilation of large or small bowel. No evidence of acute bowel inflammation. Vascular/Lymphatic: Normal caliber abdominal aorta. Aortic atherosclerosis. The portal, splenic and superior mesenteric veins are patent. Decreased size of the retroperitoneal, bilateral pelvic sidewall/iliac side chain and right inguinal lymph nodes. For reference: -Retrocaval lymph node measures 6 mm in short axis on image 69/2 previously 11 mm -Right sidewall lymph node measures 1.5 cm in short axis on image 110/2 previously 2.9 cm Reproductive: Evaluation of the pelvic reproductive structures is limited by streak artifact from left hip arthroplasty. Other: No significant abdominopelvic free fluid. No discrete peritoneal or omental nodularity. Musculoskeletal: No aggressive lytic or blastic lesion of bone. Multilevel degenerative changes spine. Left total hip arthroplasty. Degenerative change of the right hip. IMPRESSION: 1. Decreased size of the retroperitoneal, bilateral pelvic sidewall/iliac side chain and right inguinal lymph nodes, compatible with treatment response. 2. Stable scattered tiny pulmonary nodules, nonspecific but favored benign. Suggest continued attention on follow-up imaging 3. No evidence of new or  progressive metastatic disease within the chest,  abdomen or pelvis. 4. Diffuse hepatic steatosis. 5.  Aortic Atherosclerosis (ICD10-I70.0). Electronically Signed   By: Reyes Holder M.Lisa.   On: 02/05/2023 15:03   XR Wrist Complete Right  Result Date: 01/13/2023 Right wrist  3 views: Right wrist distal radius fracture remains unchanged in overall position alignment.  Fracture is healed.  No acute findings otherwise.     02/16/2023 Pathology Results    SURGICAL PATHOLOGY CASE: [id number] PATIENT: Jamye  Sw[facility] Surgical Pathology Report  Clinical History: poorly differentiated carcinoma of likely GYN origin  FINAL MICROSCOPIC DIAGNOSIS:  A. UTERUS, CERVIX, BILATERAL FALLOPIAN TUBES, AND OVARIES, RESECTION: Cervix     Nabothian cysts     Negative for dysplasia or malignancy Endometrium     Atrophic     Negative for atypia, hyperplasia or malignancy Myometrium     Unremarkable with focal vascular calcifications     Negative for malignancy Right ovary     Focal paraovarian, inactive endometriosis     Negative for malignancy Left ovary     Unremarkable     Negative for endometriosis or malignancy Right fallopian tube     Not identified in entirely submitted right adnexa Left fallopian tube     Not identified in entirely submitted left adnexa  B. RIGHT PELVIC LYMPH NODE, EXCISION: Lymph node with hyalin fibrosis, focal necrosis and dystrophic calcifications Focal perinodal fat necrosis Negative for residual, viable carcinoma See comment  C. SEGMENT OF OBTURATOR NERVE, EXCISION: Benign nerve Negative for malignancy  Lisa. OMENTUM: Benign adipose tissue Negative for malignancy  COMMENT: B. The right pelvic lymph node is entirely submitted and shows large areas of hyalin fibrosis focally associated with necrosis and dystrophic calcifications.  The findings are consistent with tumor regression although no residual, viable carcinoma is identified.     02/16/2023  Surgery   GYNECOLOGIC ONCOLOGY OPERATIVE NOTE   Date of Service: 02/16/2023   Preoperative Diagnosis: Poorly differentiated carcinoma of likely GYN origin    Postoperative Diagnosis: Same, obturator nerve injury and repair   Procedures: Robotic-assisted total laparoscopic hysterectomy, bilateral salpingo-oophorectomy, tumor debulking of right pelvic lymph node and minilaparotomy for omentectomy, repair of right obturator nerve  Findings: On bimanual exam, small mobile uterus, no adnexal mass. No palpable right lymphadenopathy. On entry to abdomen, normal upper abdominal survey with smooth diaphragm, liver, stomach and normal appearing omentum and bowel. Nodule in the gallbladder fossa (image in media). In the pelvis, filmy adhesions of the sigmoid colon to the left pelvic sidewall. Otherwise small, normal appearing uterus and bilateral fallopian tubes and ovaries. Right pelvic sidewall with enlarged lymph node, approximately 4x2cm, densely adherent to the pelvic sidewall, external iliac vein. Densely adherent and encasing the obturator nerve and artery. With careful dissection, unavoidable avulsion injury occurred to the obturator artery. Due to the densely adherent nature of the lymph node, the nerve and artery were initially inseparable. In obtaining control of the bleeding from the obturator artery, unavoidable crush and cautery injury to the obturator nerve. Following removal of the involved node, the affected portion of the obturator nerve was resected and well reapproximated. No other gross evidence of disease in the abdomen or pelvis.    05/21/2023 Imaging   CT CHEST ABDOMEN PELVIS W CONTRAST  Result Date: 05/21/2023 CLINICAL DATA:  History of endometrial cancer, monitor. * Tracking Code: BO *. EXAM: CT CHEST, ABDOMEN, AND PELVIS WITH CONTRAST TECHNIQUE: Multidetector CT imaging of the chest, abdomen and pelvis was performed following the standard protocol during bolus administration of  intravenous  contrast. RADIATION DOSE REDUCTION: This exam was performed according to the departmental dose-optimization program which includes automated exposure control, adjustment of the mA and/or kV according to patient size and/or use of iterative reconstruction technique. CONTRAST:  OMNIPAQUE  IOHEXOL  300 MG/ML  SOLN COMPARISON:  Multiple priors including most recent CT February 05, 2023. FINDINGS: CT CHEST FINDINGS Cardiovascular: Accessed right chest Port-A-Cath with tip in the right atrium. Aortic atherosclerosis. No central pulmonary embolus on this nondedicated study. Normal size heart. No significant pericardial effusion/thickening. Mediastinum/Nodes: No suspicious thyroid  nodule. No pathologically enlarged mediastinal, hilar or axillary lymph nodes. The esophagus is grossly unremarkable. Lungs/Pleura: Stable scattered tiny pulmonary nodules. No new suspicious pulmonary nodules or masses. For reference: -right upper lobe pulmonary nodule measuring 2 mm on image 27/302 -pulmonary nodule along the right major fissure measuring 2 mm on image 66/302 -pulmonary nodule along the left major fissure measuring 4 mm on image 63/302. Musculoskeletal: No aggressive lytic or blastic lesion of bone. CT ABDOMEN PELVIS FINDINGS Hepatobiliary: No suspicious hepatic lesion. Gallbladder is unremarkable. No biliary ductal dilation. Pancreas: No pancreatic ductal dilation or evidence of acute inflammation. Spleen: No splenomegaly. Adrenals/Urinary Tract: Bilateral adrenal glands appear normal. No hydronephrosis. Kidneys demonstrate symmetric enhancement. Urinary bladder not well evaluated due to streak artifact from left hip arthroplasty. Stomach/Bowel: No radiopaque enteric contrast material administered. Stomach is unremarkable for degree of distension. No pathologic dilation of small or large bowel. No evidence of acute bowel inflammation. Vascular/Lymphatic: Normal caliber abdominal aorta. Aortic atherosclerosis. The  portal, splenic and superior mesenteric veins are patent. Decreased size of the retroperitoneal, pelvic and inguinal lymph nodes although evaluation is limited by streak artifact from left hip arthroplasty. For reference: -retrocaval lymph node measures 6 mm on image 70/301 previously 11 mm -right pelvic sidewall lymph node measures 13 mm in short axis on image 103/301 previously 15 mm. -right inguinal lymph node measures 7 mm in short axis on image 121/301 previously 9 mm Reproductive: Evaluation of the pelvic structures is limited by streak artifact from left hip arthroplasty. Other: No significant abdominopelvic free fluid. No discrete peritoneal or omental nodularity. Musculoskeletal: No aggressive lytic or blastic lesion of bone. Left total hip arthroplasty. Similar sclerotic foci in the left iliac bone and right acetabulum favored to reflect bone islands. IMPRESSION: 1. Decreased size of the retroperitoneal pelvic and inguinal lymph nodes. 2. Stable scattered tiny pulmonary nodules, favored benign. 3. No evidence of new or progressive disease in the chest, abdomen or pelvis. 4.  Aortic Atherosclerosis (ICD10-I70.0). Electronically Signed   By: Reyes Holder M.Lisa.   On: 05/21/2023 16:26      10/31/2023 Imaging   Mild pericolonic inflammatory stranding surrounding the hepatic flexure of the colon possibly reflecting a mild infectious or inflammatory colitis. No evidence of obstruction or perforation.   01/28/2024 Imaging   CT CHEST ABDOMEN PELVIS W CONTRAST Result Date: 02/07/2024 EXAM: CT CHEST, ABDOMEN AND PELVIS WITH CONTRAST 01/28/2024 03:42:33 PM TECHNIQUE: CT of the chest, abdomen and pelvis was performed with the administration of intravenous contrast. Multiplanar reformatted images are provided for review. Automated exposure control, iterative reconstruction, and/or weight based adjustment of the mA/kV was utilized to reduce the radiation dose to as low as reasonably achievable. COMPARISON: CTAB  10/31/2023 and CT chest, abdomen, and pelvis 05/21/2023. CLINICAL HISTORY: Staging uterine cancer. Malignant neoplasm of uterus. FINDINGS: CHEST: MEDIASTINUM: Mild cardiac enlargement. No enlarged mediastinum or hilar lymph nodes. THORACIC LYMPH NODES: No mediastinal, hilar or axillary lymphadenopathy. LUNGS AND PLEURA: Stable subpleural nodule  within the lung superior segment of the left lower lobe abutting the oblique fissure measuring 6 mm, image 68/302. Several additional less than 5 mm lung nodules are also unchanged in the interval. no new or suspicious lung nodules. No pleural effusion or pneumothorax. ABDOMEN AND PELVIS: LIVER: The liver is unremarkable. GALLBLADDER AND BILE DUCTS: Gallbladder is unremarkable. No biliary ductal dilatation. SPLEEN: No acute abnormality. PANCREAS: No acute abnormality. ADRENAL GLANDS: No acute abnormality. KIDNEYS, URETERS AND BLADDER: No stones in the kidneys or ureters. No hydronephrosis. No perinephric or periureteral stranding. Urinary bladder is unremarkable. GI AND BOWEL: No pathologic dilatation of the bowel loops. Mild diffuse stool burden noted within the colon. There is no bowel obstruction. REPRODUCTIVE ORGANS: Uterus appears surgically absent. No adnexal mass identified. PERITONEUM AND RETROPERITONEUM: No ascites. No peritoneal nodule or mass identified. VASCULATURE: Aortic atherosclerotic calcification. ABDOMINAL AND PELVIS LYMPH NODES: No enlarged lymph nodes within the abdomen. The left pelvic sidewall lymph node is unchanged, measuring 9 mm (image 104/301). 1 cm right inguinal lymph node is stable, image 121/3. REPRODUCTIVE ORGANS: Uterus appears surgically absent. No adnexal mass identified. BONES AND SOFT TISSUES: Previous left hip arthroplasty. Stable sclerotic lesion within the right ischium measuring 7 mm, favored to represent a benign bone island, image 118/301. Small bone island within the left iliac bone is also unchanged, image 95/301. No acute or  suspicious osseous lesions. No focal soft tissue abnormality. IMPRESSION: 1. No evidence of metastatic disease in the chest, abdomen, or pelvis. 2. Stable subpleural nodule within the lung superior segment of the left lower lobe abutting the oblique fissure measuring 6 mm, with several additional less than 5 mm lung nodules unchanged in the interval. No new or suspicious lung nodules. Electronically signed by: Waddell Calk MD 02/07/2024 05:29 AM EDT RP Workstation: [id number]   US  THYROID  Result Date: 01/22/2024 CLINICAL DATA:  Prior ultrasound follow-up. History of isthmic thyroid  nodule seen in 2023. EXAM: THYROID  ULTRASOUND TECHNIQUE: Ultrasound examination of the thyroid  gland and adjacent soft tissues was performed. COMPARISON:  Prior thyroid  ultrasound 12/16/2021 FINDINGS: Parenchymal Echotexture: Markedly heterogenous Isthmus: 0.3 cm Right lobe: 3.3 x 0.8 x 0.9 cm Left lobe: 2.3 x 0.6 x 0.8 cm _________________________________________________________ Estimated total number of nodules >/= 1 cm: 0 Number of spongiform nodules >/=  2 cm not described below (TR1): 0 Number of mixed cystic and solid nodules >/= 1.5 cm not described below (TR2): 0 _________________________________________________________ Small and diffusely heterogeneous thyroid  gland. No discrete thyroid  nodules are identified. The previously noted isthmic nodule is no longer visible and appears to have resolved. IMPRESSION: 1. Interval resolution of previously identified nodule in the thyroid  isthmus. No thyroid  nodules are present on today's exam. 2. Small/atrophied and diffusely heterogeneous thyroid  gland. The above is in keeping with the ACR TI-RADS recommendations - J Am Coll Radiol 2017;14:587-595. Electronically Signed   By: Wilkie Lent M.Lisa.   On: 01/22/2024 09:38      05/23/2024 PET scan   NM PET Image Restage (PS) Skull Base to Thigh (F-18 FDG) Result Date: 05/30/2024 EXAM: PET AND CT SKULL BASE TO MID THIGH 05/23/2024  12:40:16 PM TECHNIQUE: RADIOPHARMACEUTICAL: 6.4 mCi F-18 FDG Uptake time 60 minutes. Glucose level 81 mg/dl. Blood pool SUV 2.8. PET imaging was acquired from the base of the skull to the mid thighs. Non-contrast enhanced computed tomography was obtained for attenuation correction and anatomic localization. COMPARISON: 05/09/2024 abdominopelvic CT. Prior PET of 11/16/2022. CLINICAL HISTORY: Metastatic disease evaluation; Per Dr. Eldonna, poorly differentiated carcinoma of likely  GYN origin with metastatic to lymph nodes. FINDINGS: HEAD AND NECK: No hypermetabolic cervical lymph nodes are identified. CHEST: There are no hypermetabolic mediastinal, hilar or axillary lymph nodes. No hypermetabolic pulmonary activity or suspicious nodularity. Port a cath tip at high right atrium. Thoracic aortic atherosclerosis. Nodule on the left major fissure of 5 mm is similar and likely a subpleural lymph node on image 35 / 7. ABDOMEN AND PELVIS: There is no hypermetabolic activity within the liver, adrenal glands, spleen or pancreas. The left pelvic sidewall node of interest on the recent diagnostic CT is hypermetabolic. Example at 2.1 x 1.3 cm and SUV 9.0 on image 162 / 3. Compare 1.4 cm and SUV 6.6 on the prior PET. The other hypermetabolic pelvic adenopathy has resolved. No residual abdominal retroperitoneum nodal hypermetabolism. Abdominal aortic atherosclerosis. Right perimidline small fat containing ventral abdominal wall hernia. Beam hardening artifact degrees evaluation of the pelvis secondary to left hip arthroplasty. Hysterectomy. BONES AND SOFT TISSUE: There is no hypermetabolic activity to suggest osseous metastatic disease. Right ischial sclerotic lesion is likely a bone island and present on the prior PET. IMPRESSION: 1. Isolated left pelvic sidewall hypermetabolic adenopathy, confirming recurrent/progressive disease since 01/28/24. 2. When compared to the most recent PET of 11/16/22, response to therapy throughout the  remainder of the abdomen and pelvis. 3. Aortic atherosclerosis (ICD-10 I70.0). Electronically signed by: Rockey Kilts MD 05/30/2024 04:05 PM EST RP Workstation: [id number]   CT ABDOMEN PELVIS W CONTRAST Result Date: 05/11/2024 EXAM: CT ABDOMEN AND PELVIS WITH CONTRAST 05/09/2024 01:53:52 PM TECHNIQUE: CT of the abdomen and pelvis was performed with the administration of 75 mL of iohexol  (OMNIPAQUE ) 300 MG/ML solution. Multiplanar reformatted images are provided for review. Automated exposure control, iterative reconstruction, and/or weight-based adjustment of the mA/kV was utilized to reduce the radiation dose to as low as reasonably achievable. COMPARISON: CT Abdomen Pelvis with contrast 10/31/2023. CLINICAL HISTORY: Abdominal pain, acute, nonlocalized; Genital cancer, monitor. * Tracking Code: BO * FINDINGS: LOWER CHEST: Normal heart size. Clear lung bases. LIVER: Normal, without mass or intrahepatic biliary duct dilatation. GALLBLADDER AND BILE DUCTS: Gallbladder is unremarkable. No biliary ductal dilatation. SPLEEN: Normal in size and morphology. PANCREAS: Normal, without duct dilatation or acute inflammation. ADRENAL GLANDS: No acute abnormality. KIDNEYS, URETERS AND BLADDER: No renal mass or hydronephrosis. Normal urinary bladder. Mildly degraded evaluation of the bladder secondary to beam hardening artifact from left hip arthroplasty. GI AND BOWEL: Normal stomach, without wall thickening. Normal small bowel caliber. Colonic stool burden suggests constipation. Appendix not visualized. No right lower quadrant inflammation. PERITONEUM AND RETROPERITONEUM: No ascites. No free air. Left hip arthroplasty. VASCULATURE: Aorta is normal in caliber. Aortic atherosclerosis. LYMPH NODES: No abdominal adenopathy. Bilateral inguinal nodes of up to 1.0 cm are similar and likely reactive. A left external iliac node measures 12 x 19 mm on image 61/301. this area is obscured by artifact on the prior estimated at 9 x 16 mm  on that exam. REPRODUCTIVE ORGANS: No acute abnormality. BONES AND SOFT TISSUES: Presumed bone island within the right ischium at 8 mm, similar. Degenerative disc disease at the Lumbosacral junction. Trace L4-L5 anterolisthesis. No acute osseous abnormality. No focal soft tissue abnormality. IMPRESSION: 1. Status post hysterectomy. Mild left external iliac adenopathy, new or increased since the prior exam; early nodal metastasis cannot be excluded. Consider PET versus CT follow-up at 3 months. 2. Aortic atherosclerosis. 3. Colonic stool burden suggests constipation. 4. Mild degradation secondary to beam hardening artifact from left hip arthroplasty. Electronically signed by:  Rockey Kilts MD 05/11/2024 06:12 PM EST RP Workstation: [id number]      07/04/2024 Surgery   Procedures: Robotic assisted laparoscopic bilateral pelvic lymph node debulking   Findings: On entry to abdomen, upper abdominal survey with smooth diaphragm.  Approximate 1 cm nodule noted to be attached to the liver capsule near the gallbladder fossa, excised.  Otherwise normal-appearing liver, stomach, omentum, and bowel.  Filmy adhesions of the cecum to the right pelvic sidewall.  Filmy adhesions of the rectosigmoid colon to the left pelvic sidewall.  Surgically absent uterus, cervix, bilateral fallopian tubes and ovaries.  Evidence of prior right pelvic lymph node dissection with empty right obturator space and well-healed right obturator repair with suture in place.  Mildly prominent right pelvic lymph node on the external iliac artery, removed.  Left pelvic lymph nodes with several firm and prominent lymph nodes that were not enlarged but removed given appearance.  Enlarged, firm lymph node that was PET positive identified in the left obturator space, removed.  Ultimately, complete left pelvic lymphadenectomy.  No evidence of peritoneal carcinomatosis or ascites.    07/04/2024 Pathology Results   A. LIVER NODULE, BIOPSY:      Benign  hyalinized nodule with focal calcifications.      Negative for malignancy.  B. LYMPH NODE, LEFT PELVIC, LYMPHADENECTOMY:      Four lymph nodes, positive for metastatic carcinoma (4/4).      See comment.  C. LYMPH NODE, RIGHTPELVIC, LYMPHADENECTOMY:      One lymph node, positive for metastatic carcinoma (1/1).  COMMENT:  B.The specimen shows four lymph nodes with involvement by a high-grade epithelioid malignancy. The cells demonstrate two tone morphology. One population shows sheets of cells with intermediate size with moderate cytoplasm. The other population shows pleomorphic morphology with large nuclei and abundant eosinophilic cytoplasm. Extensive lymphovascular invasion is seen. Immunohistochemical stains were performed to characterize the cells. The cells are positive for GATA3, and are negative for GCDFP-15, AR, SOX10, PAX8, WT-1, and p63. CK5/6 is strongly positive in the population of intermediate size while it is patchy positive in the large pleomorphic cells. ER and PR are negative. Her2 shows positivity (3+) in the pleomorphic cells and is negative in the cells of intermediate size. The overall immunoprofile is non-specific. The site of primary could be urinary tract (urothelial carcinoma), breast, or gynecologic system. Correlation with imaging study and clinical information is recommended.      Interval History: Pt reports that she is recovering well from surgery. She is using tylenol  and gabapentin  for pain. Has been having left leg neuropathic pain which the tylenol  PM and gaba helps some.  Still persistent but some improvement over time since surgery.  Usually the pain is mostly her left inner thigh but sometimes runs down her leg further.  She is eating and drinking well. She is voiding without issue and having regular bowel movements.    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   Left Inner Eye(MOH's)   PONV (postoperative nausea and vomiting)    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   Top of Left Hand(Tx p Bx)    Past Surgical History:  Procedure Laterality Date   CARPAL TUNNEL RELEASE     BILAT     CATARACT EXTRACTION     BILAT    ECTOPIC PREGNANCY SURGERY     in the setting of prior BTL, lapartomy   IR IMAGING GUIDED PORT INSERTION  12/02/2022   KNEE CARTILAGE SURGERY     RIGHT    (MENISCUS)   LAPAROTOMY N/A 02/16/2023   Procedure: MINI LAPAROTOMY;  Surgeon: Eldonna Mays, MD;  Location: [facility];  Service: Gynecology;  Laterality: N/A;   LYMPH NODE DISSECTION Right 02/16/2023   Procedure: RIGHT PELVIC LYMPH NODE DISSECTION;  Surgeon: Eldonna Mays, MD;  Location: [facility];  Service: Gynecology;  Laterality: Right;   MOHS SURGERY     left nose/inner eye for basal cell   TONSILLECTOMY     TOTAL HIP ARTHROPLASTY Left 12/19/2015   Procedure: LEFT TOTAL HIP ARTHROPLASTY;  Surgeon: Maude LELON Right, MD;  Location: [facility];  Service: Orthopedics;  Laterality: Left;   TRIGGER FINGER RELEASE     TUBAL LIGATION      Family History  Problem Relation Age of Onset   Uterine cancer Mother 17   Heart attack Mother    Hypertension Mother    Kidney cancer Father 31   Lung cancer Father    Diabetes Sister    Thyroid  disease Neg Hx    Colon cancer Neg Hx    Breast cancer Neg Hx    Ovarian cancer Neg Hx    Endometrial cancer Neg Hx    Pancreatic cancer Neg Hx    Prostate cancer Neg Hx     Social History   Socioeconomic  History   Marital status: Married    Spouse name: Not on file   Number of children: Not on file   Years of education: Not on file   Highest education level: Not on file  Occupational History   Not on file  Tobacco Use   Smoking status: Never   Smokeless tobacco: Never  Vaping Use   Vaping status: Never Used  Substance and Sexual Activity   Alcohol  use: Yes    Alcohol /week: 0.0 standard drinks of alcohol     Comment: Rare   Drug use: No   Sexual activity: Yes    Birth control/protection: Surgical, Post-menopausal    Comment: Tubal lig-1st intercourse 74 yo-Fewer than 5 partners  Other Topics Concern   Not on file  Social History Narrative   Not on file   Social Drivers of Health   Tobacco Use: Low Risk (07/14/2024)   Patient History    Smoking Tobacco Use: Never    Smokeless Tobacco Use: Never    Passive Exposure: Not on file  Financial Resource Strain: Not on file  Food Insecurity: Low Risk (06/30/2024)   Received from [facility]   Epic    Within the past 12 months, you worried that your food would run out before you got money to buy more: Never true    Within the past 12 months, the food you bought just didn't last and you didn't have money to get more. : Never true  Transportation Needs: No Transportation Needs (06/30/2024)   Received from [facility]  Transportation    In the past 12 months, has lack of reliable transportation kept you from medical appointments, meetings, work or from getting things needed for daily living? : No  Physical Activity: Not on file  Stress: Not on file  Social Connections: Socially Integrated (10/31/2023)   Social Connection and Isolation Panel    Frequency of Communication with Friends and Family: More than three times a week    Frequency of Social Gatherings with Friends and Family: Three times a week    Attends Religious Services: More than 4 times per year    Active Member of Clubs or Organizations: Yes    Attends Tax Inspector Meetings: More than 4 times per year    Marital Status: Married  Depression (PHQ2-9): Low Risk (10/30/2022)   Depression (PHQ2-9)    PHQ-2 Score: 0  Alcohol  Screen: Not on file  Housing: Low Risk (06/30/2024)   Received from [facility]   Epic    What is your living situation today?: I have a steady place to live    Think about the place you live. Do you have problems with any of the following? Choose all that apply:: None/None on this list  Utilities: Low Risk (06/30/2024)   Received from [facility]   Utilities    In the past 12 months has the electric, gas, oil, or water  company threatened to shut off services in your home? : No  Health Literacy: Not on file    Current Medications: Current Medications[1]  Review of Symptoms: Complete 10-system review is positive for: Stable leg edema.  Physical Exam: BP 126/70 (BP Location: Left Arm, Patient Position: Sitting)   Pulse 79   Temp 98.5 F (36.9 C) (Oral)   Resp 16   Wt 138 lb (62.6 kg)   SpO2 99%   BMI 24.45 kg/m  General: Alert, oriented, no acute distress. HEENT: Normocephalic, atraumatic. Neck symmetric without masses. Sclera anicteric.  Chest: Normal work of breathing.  Cardiovascular: Regular rate Abdomen: Soft, nontender.  Normoactive bowel sounds.  No masses appreciated.  Well-healing incisions. Extremities: Grossly normal range of motion.  Warm, well perfused.  No edema bilaterally. Skin: No rashes or lesions noted.   Laboratory & Radiologic Studies: Surgical pathology (07/04/24): A. LIVER NODULE, BIOPSY:      Benign hyalinized nodule with focal calcifications.      Negative for malignancy.  B. LYMPH NODE, LEFT PELVIC, LYMPHADENECTOMY:      Four lymph nodes, positive for metastatic carcinoma (4/4).      See comment.  C. LYMPH NODE, RIGHTPELVIC, LYMPHADENECTOMY:      One lymph node, positive for metastatic carcinoma (1/1).  COMMENT: B.The specimen shows four lymph nodes with involvement by a  high-grade epithelioid malignancy. The cells demonstrate two tone morphology. One population shows sheets of cells with intermediate size with moderate cytoplasm. The other population shows pleomorphic morphology with large nuclei and abundant eosinophilic cytoplasm. Extensive lymphovascular invasion is seen. Immunohistochemical stains were performed to characterize the cells. The cells are positive for GATA3, and are negative for GCDFP-15, AR, SOX10, PAX8, WT-1, and p63. CK5/6 is strongly positive in the population of intermediate size while it is patchy positive in the large pleomorphic cells. ER and PR are negative. Her2 shows positivity (3+) in the pleomorphic cells and is negative in the cells of intermediate size. The overall immunoprofile is non-specific. The site of primary could be urinary tract (urothelial carcinoma), breast, or gynecologic system. Correlation with imaging study and clinical information  is recommended.      [1]  Current Outpatient Medications:    pregabalin  (LYRICA ) 25 MG capsule, Take 1 capsule (25 mg total) by mouth 2 (two) times daily., Disp: 60 capsule, Rfl: 1   acetaminophen  (TYLENOL ) 500 MG tablet, Take 500-1,000 mg by mouth every 6 (six) hours as needed (for muscle pain or soreness)., Disp: , Rfl:    aspirin  EC 81 MG tablet, Take 81 mg by mouth at bedtime. Swallow whole., Disp: , Rfl:    Blood Glucose Monitoring Suppl (BLOOD GLUCOSE MONITOR SYSTEM) w/Device KIT, Use to check blood sugar three times daily, Disp: 1 kit, Rfl: 0   calcium carbonate (TUMS) 500 MG chewable tablet, Chew 1 tablet by mouth daily., Disp: , Rfl:    Carboxymethylcellulose Sodium (THERATEARS OP), Place 1 drop into both eyes 3 (three) times daily., Disp: , Rfl:    Cholecalciferol (VITAMIN Lisa ) 50 MCG (2000 UT) tablet, Take 2,000 Units by mouth daily., Disp: , Rfl:    Continuous Glucose Sensor (FREESTYLE LIBRE 3 PLUS SENSOR) MISC, Inject 1 Device into the skin continuous. Change every 15  days, Disp: 6 each, Rfl: 3   diclofenac  sodium (VOLTAREN ) 1 % GEL, Apply 2-4 g topically 4 (four) times daily. (Patient not taking: Reported on 06/30/2024), Disp: 3 Tube, Rfl: 3   DULoxetine  (CYMBALTA ) 30 MG capsule, Take 1 capsule (30 mg total) by mouth daily. (Patient not taking: Reported on 06/30/2024), Disp: 30 capsule, Rfl: 3   gabapentin  (NEURONTIN ) 100 MG capsule, Take 100-200 mg by mouth daily as needed (for hot flashes)., Disp: , Rfl:    Glucose Blood (BLOOD GLUCOSE TEST STRIPS) STRP, Use to check blood sugar three times daily, Disp: 100 strip, Rfl: 0   insulin  aspart (NOVOLOG ) 100 UNIT/ML FlexPen, Inject 4-8 Units into the skin 3 (three) times daily with meals., Disp: 30 mL, Rfl: 3   insulin  glargine (LANTUS ) 100 UNIT/ML Solostar Pen, Inject 20 Units into the skin daily. (Patient taking differently: Inject 4-9 Units into the skin See admin instructions. Inject 9 units subcutaneously in the morning and take 4 units in the evening), Disp: 15 mL, Rfl: 3   Insulin  Pen Needle (PEN NEEDLES) 31G X 5 MM MISC, Use to inject insulin  as directed, Disp: 300 each, Rfl: 5   Lancet Device MISC, 1 each by Does not apply route 3 (three) times daily. May dispense any manufacturer covered by patient's insurance., Disp: 1 each, Rfl: 0   Lancets MISC, Use to check blood sugar three times daily, Disp: 100 each, Rfl: 0   levothyroxine  (SYNTHROID ) 125 MCG tablet, Take 1 tablet (125 mcg total) by mouth daily before breakfast., Disp: 90 tablet, Rfl: 3   lidocaine -prilocaine  (EMLA ) cream, Apply 1 Application topically as needed. (Patient not taking: Reported on 06/30/2024), Disp: 30 g, Rfl: 3   melatonin 5 MG TABS, Take 5 mg by mouth at bedtime as needed (sleep)., Disp: , Rfl:    meloxicam  (MOBIC ) 7.5 MG tablet, Take 1 tablet (7.5 mg total) by mouth daily. (Patient not taking: Reported on 06/30/2024), Disp: 30 tablet, Rfl: 1   Multiple Vitamin (MULTIVITAMIN) tablet, Take 1 tablet by mouth daily with breakfast., Disp: , Rfl:     rosuvastatin (CRESTOR) 5 MG tablet, Take 5 mg by mouth daily., Disp: , Rfl:    senna-docusate (SENOKOT-S) 8.6-50 MG tablet, Take 2 tablets by mouth at bedtime. For AFTER surgery, do not take if having diarrhea, Disp: 30 tablet, Rfl: 0

## 2024-07-26 ENCOUNTER — Encounter: Payer: Self-pay | Admitting: Hematology and Oncology

## 2024-07-26 ENCOUNTER — Encounter: Payer: Self-pay | Admitting: Psychiatry

## 2024-07-28 ENCOUNTER — Encounter: Payer: Self-pay | Admitting: Hematology and Oncology

## 2024-07-28 ENCOUNTER — Telehealth: Payer: Self-pay

## 2024-07-28 ENCOUNTER — Ambulatory Visit

## 2024-07-28 ENCOUNTER — Inpatient Hospital Stay: Admitting: Hematology and Oncology

## 2024-07-28 VITALS — BP 120/72 | HR 69 | Temp 97.8°F | Resp 14 | Ht 63.0 in | Wt 133.0 lb

## 2024-07-28 DIAGNOSIS — E109 Type 1 diabetes mellitus without complications: Secondary | ICD-10-CM | POA: Diagnosis not present

## 2024-07-28 DIAGNOSIS — C775 Secondary and unspecified malignant neoplasm of intrapelvic lymph nodes: Secondary | ICD-10-CM | POA: Diagnosis not present

## 2024-07-28 DIAGNOSIS — C55 Malignant neoplasm of uterus, part unspecified: Secondary | ICD-10-CM

## 2024-07-28 LAB — CYTOLOGY - NON PAP

## 2024-07-28 NOTE — Assessment & Plan Note (Signed)
 We discussed risk of uncontrolled hyperglycemia while on treatment

## 2024-07-28 NOTE — Assessment & Plan Note (Addendum)
 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, responded very well with complete response with neoadjuvant chemotherapy with combination of carboplatin , paclitaxel  and pembrolizumab  followed by surgery and maintenance immunotherapy with pembrolizumab  Er neg, MSI stable, PD-L1 80%   Her recent treatment course was complicated by development of insulin -dependent diabetes and hospitalization due to DKA Since discontinuation of treatment, she is asymptomatic from uterine cancer perspective Her CT imaging from November 2025 unfortunately shows signs of cancer relapse, confirmed on PET/CT imaging She subsequently underwent surgical resection Pathology of recent resection specimen confirmed recurrent metastatic carcinoma, high-grade, immunologic profile is slightly different than before, HER2/neu +3+ I reviewed recent molecular testing done on [facility] and compare with previous profile that was done on [facility] 1 Some of the markers are preserved including NF 2, ARID1A, p53 and TERT, as well as low tumor mutation burden and MSI stability Additional profile predicted tumor origin to be urothelial in nature  I reviewed multiple imaging studies with the patient Case was recently reviewed and discussed at the GYN oncology tumor board Due to presence of left-sided hip prosthesis, that is obscured view of her bladder We discussed risk and benefits of urology referral for evaluation with cystoscopy  Overall, in terms of adjuvant treatment, we discussed the role of combination of carboplatin , paclitaxel  with trastuzumab versus Enhertu We discussed the role of echocardiogram monitoring We discussed the role of repeat staging scan prior to initiation of treatment  After a very long discussion, she is in agreement to proceed with urology referral, echocardiogram monitoring scan as well as PET/CT imaging I  will see her back in 2 weeks to discuss treatment options

## 2024-07-28 NOTE — Progress Notes (Signed)
 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, responded very well with complete response with neoadjuvant chemotherapy with combination of carboplatin , paclitaxel  and pembrolizumab  followed by surgery and maintenance immunotherapy with pembrolizumab  Er neg, MSI stable, PD-L1 80%   Her recent treatment course was complicated by development of insulin -dependent diabetes and hospitalization due to DKA Since discontinuation of treatment, [name] is asymptomatic from uterine cancer perspective Her CT imaging from November 2025 unfortunately shows signs of cancer relapse, confirmed on PET/CT imaging [name] subsequently underwent surgical resection Pathology of recent resection specimen confirmed recurrent metastatic carcinoma, high-grade, immunologic profile is slightly different than before, HER2/neu +3+ I reviewed recent molecular testing done on [facility] and compare with previous profile that was done on foundation 1 Some of the markers are preserved including NF 2, ARID1A, p53 and TERT, as well as low tumor mutation burden and MSI stability Additional profile predicted tumor origin to be urothelial in nature  I reviewed multiple imaging studies with the patient Case was recently reviewed and discussed at the GYN oncology tumor board Due to presence of left-sided hip prosthesis, that is obscured view of her bladder We discussed risk and benefits of urology referral for evaluation with cystoscopy  Overall, in terms of adjuvant treatment, we discussed the role of combination of carboplatin , paclitaxel  with trastuzumab versus Enhertu We discussed the role of echocardiogram monitoring We discussed the role of  repeat staging scan prior to initiation of treatment  After a very long discussion, [name] is in agreement to proceed with urology referral, echocardiogram monitoring scan as well as PET/CT imaging I will see her back in 2 weeks to discuss treatment options Type 1 diabetes mellitus without complications (HCC) We discussed risk of uncontrolled hyperglycemia while on treatment  Orders Placed This Encounter  Procedures   NM PET Image Restag (PS) Skull Base To Thigh    Standing Status:   Future    Expected Date:   08/04/2024    Expiration Date:   07/28/2025    If indicated for the ordered procedure, I authorize the administration of a radiopharmaceutical per Radiology protocol:   Yes    Preferred imaging location?:   [place]   CBC with Differential (Cancer Center Only)    Standing Status:   Future    Expected Date:   08/14/2024    Expiration Date:   08/14/2025   CMP (Cancer Center only)    Standing Status:   Future    Expected Date:   08/14/2024    Expiration Date:   08/14/2025   CBC with Differential (Cancer Center Only)    Standing Status:   Future    Expected Date:   09/04/2024    Expiration Date:   09/04/2025   CMP (Cancer Center only)    Standing Status:   Future    Expected Date:   09/04/2024    Expiration Date:   09/04/2025   CBC with Differential (Cancer Center Only)    Standing Status:   Future    Expected Date:   09/25/2024    Expiration Date:   09/25/2025   CMP (Cancer Center only)    Standing Status:   Future    Expected Date:   09/25/2024    Expiration Date:  09/25/2025   CBC with Differential (Cancer Center Only)    Standing Status:   Future    Expected Date:   10/16/2024    Expiration Date:   10/16/2025   CMP (Cancer Center only)    Standing Status:   Future    Expected Date:   10/16/2024    Expiration Date:   10/16/2025   CBC with Differential (Cancer Center Only)    Standing Status:   Future    Expected Date:   11/06/2024    Expiration Date:   11/06/2025   CMP (Cancer Center  only)    Standing Status:   Future    Expected Date:   11/06/2024    Expiration Date:   11/06/2025   CBC with Differential (Cancer Center Only)    Standing Status:   Future    Expected Date:   11/27/2024    Expiration Date:   11/27/2025   CMP (Cancer Center only)    Standing Status:   Future    Expected Date:   11/27/2024    Expiration Date:   11/27/2025   Ambulatory referral to Urology    Referral Priority:   Routine    Referral Type:   Consultation    Referral Reason:   Specialty Services Required    Referred to Provider:   Roseann Adine PARAS., MD    Requested Specialty:   Urology    Number of Visits Requested:   1   ECHOCARDIOGRAM COMPLETE    Standing Status:   Future    Expected Date:   08/04/2024    Expiration Date:   07/28/2025    Perflutren DEFINITY (image enhancing agent) should be administered unless hypersensitivity or allergy exist:   Administer Perflutren    Reason for exam-Echo:   Chemo  Z09    Where should this test be performed:   [facility]    Does the patient weigh less than or greater than 250 lbs?:   Patient weighs less than 250 lbs     Lisa Bedford, MD  INTERVAL HISTORY: [name] returns for surveillance follow-up I reviewed surgical report and pathology report and we discussed treatment options [name] is healing well from surgery but still have some minor pain, well-controlled with Tylenol  and gabapentin   PHYSICAL EXAMINATION: ECOG PERFORMANCE STATUS: 1 - Symptomatic but completely ambulatory  Vitals:   07/28/24 1303  BP: 120/72  Pulse: 69  Resp: 14  Temp: 97.8 F (36.6 C)  SpO2: 99%   Filed Weights   07/28/24 1303  Weight: 133 lb (60.3 kg)    Relevant data reviewed during this visit included surgical report, pathology report, molecular studies

## 2024-07-28 NOTE — Telephone Encounter (Signed)
 Faxed referral to Dr. Roseann at [phone], received fax confirmation.

## 2024-07-30 ENCOUNTER — Other Ambulatory Visit: Payer: Self-pay

## 2024-07-31 ENCOUNTER — Inpatient Hospital Stay

## 2024-08-01 ENCOUNTER — Other Ambulatory Visit: Payer: Self-pay | Admitting: Hematology and Oncology

## 2024-08-01 ENCOUNTER — Telehealth: Payer: Self-pay | Admitting: Psychiatry

## 2024-08-01 ENCOUNTER — Telehealth: Payer: Self-pay

## 2024-08-01 ENCOUNTER — Encounter: Payer: Self-pay | Admitting: Hematology and Oncology

## 2024-08-01 NOTE — Telephone Encounter (Signed)
-----   Message from Almarie Bedford, MD sent at 08/01/2024  9:29 AM EST ----- I can see her at 1 pm on 2/17 to review PET CT, please schedule 45 mins Ask if she can do chemo on 2/18, if yes, we can arrange for her to get labs when she see me on 2/17 and first dose chemo on 2/18, order for chemo is in

## 2024-08-01 NOTE — Telephone Encounter (Signed)
 Called back and spoke with husband. Spoke with Ladaisha , appts scheduled on 2/17. She is available for chemo start on 2/18. Sent Dr. Lonn a message.

## 2024-08-01 NOTE — Telephone Encounter (Signed)
Called and left a message asking her to call the office back regarding appts.

## 2024-08-01 NOTE — Telephone Encounter (Signed)
 Called pt regarding tumor origin testing and urine cytology. She had reviewed tumor origin testing with Dr. Lonn last week and saw the urine cytology results on her mychart. Is scheduled for cystoscopy tomorrow. We will follow-up results.

## 2024-08-02 ENCOUNTER — Encounter: Payer: Self-pay | Admitting: Urology

## 2024-08-02 ENCOUNTER — Ambulatory Visit: Admitting: Urology

## 2024-08-02 VITALS — BP 117/74 | HR 77 | Ht 63.0 in | Wt 133.0 lb

## 2024-08-02 DIAGNOSIS — R8289 Other abnormal findings on cytological and histological examination of urine: Secondary | ICD-10-CM

## 2024-08-02 DIAGNOSIS — R590 Localized enlarged lymph nodes: Secondary | ICD-10-CM | POA: Diagnosis not present

## 2024-08-02 DIAGNOSIS — C779 Secondary and unspecified malignant neoplasm of lymph node, unspecified: Secondary | ICD-10-CM

## 2024-08-02 DIAGNOSIS — C55 Malignant neoplasm of uterus, part unspecified: Secondary | ICD-10-CM | POA: Diagnosis not present

## 2024-08-02 DIAGNOSIS — C799 Secondary malignant neoplasm of unspecified site: Secondary | ICD-10-CM

## 2024-08-02 LAB — MICROSCOPIC EXAMINATION

## 2024-08-02 LAB — URINALYSIS, ROUTINE W REFLEX MICROSCOPIC
Bilirubin, UA: NEGATIVE
Glucose, UA: NEGATIVE
Leukocytes,UA: NEGATIVE
Nitrite, UA: NEGATIVE
Protein,UA: NEGATIVE
Specific Gravity, UA: 1.02 (ref 1.005–1.030)
Urobilinogen, Ur: 0.2 mg/dL (ref 0.2–1.0)
pH, UA: 5.5 (ref 5.0–7.5)

## 2024-08-02 MED ORDER — CIPROFLOXACIN HCL 500 MG PO TABS
500.0000 mg | ORAL_TABLET | Freq: Once | ORAL | Status: AC
  Administered 2024-08-02: 500 mg via ORAL

## 2024-08-02 NOTE — Progress Notes (Signed)
 "  Assessment: 1. Metastatic carcinoma (HCC)   2. Pelvic lymphadenopathy   3. Malignant neoplasm of uterus, unspecified site (HCC)   4. Abnormal urine cytology     Plan: I personally reviewed the patient's chart including provider notes, lab and imaging results. I personally reviewed the CT study from 11/25 with results as noted below. Her cystoscopy did not show any mucosal abnormalities within the bladder or urethra. Bladder washings sent for urine cytology with reflex FISH. Cipro  x 1 following cystoscopy. Will contact her with results of cytology.   I discussed the potential need for further evaluation of the upper tracts if the urine cytology is abnormal. However, I advised her that I think that urothelial carcinoma is very unlikely given her presentation and findings on cystoscopy. I do not have any clinical experience using the [facility] molecular pathology test, but in doing some brief research, it appears that gynecological tumors are known problem areas for tumor of origin classifiers as some gynecologic tumors can molecularly resemble urothelial carcinoma and the RNA classifiers collapse them into urothelial-like category, particularly in cases of metastatic disease, poorly differentiated carcinoma, or treatment effect/necrosis.   Chief Complaint:  Chief Complaint  Patient presents with   Pelvic lymphadenopathy    History of Present Illness:  [name] is a 74 y.o. female who is seen in consultation from Almarie Bedford, MD for evaluation of abnormal urine cytology and molecular testing suggesting possible urothelial cancer. 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  She responded well with neoadjuvant chemotherapy and maintenance immunotherapy.  CT imaging from November 2025 showed evidence of adenopathy in the left external iliac chain.  PET scan showed left pelvic  sidewall adenopathy. She underwent surgical resection of the lymph nodes.  Pathology confirmed recurrent metastatic carcinoma, high-grade with slightly different immunologic profile.  The additional profile predicted tumor origin to be urothelial in nature with 98% confidence.  Urine cytology from 07/25/2024 showed atypical urothelial cells.   Further evaluation with cystoscopy was requested.  She has not had any lower urinary tract symptoms.  No dysuria or gross hematuria.  No flank pain.  No history of UTIs or kidney stones. No history of tobacco use.   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   Top of Left Hand(Tx p Bx)    Past Surgical History:  Past Surgical History:  Procedure Laterality Date   CARPAL TUNNEL RELEASE     BILAT     CATARACT EXTRACTION     BILAT    ECTOPIC PREGNANCY SURGERY     in the setting of prior BTL, lapartomy   IR IMAGING GUIDED PORT INSERTION  12/02/2022   KNEE CARTILAGE SURGERY     RIGHT    (MENISCUS)   LAPAROTOMY N/A 02/16/2023   Procedure: MINI LAPAROTOMY;  Surgeon: Eldonna Mays, MD;  Location: [facility];  Service: Gynecology;  Laterality: N/A;   LYMPH NODE DISSECTION Right 02/16/2023   Procedure: RIGHT PELVIC LYMPH NODE DISSECTION;  Surgeon: Eldonna Mays, MD;  Location: [facility];  Service: Gynecology;  Laterality: Right;   MOHS SURGERY      left nose/inner eye for basal cell   TONSILLECTOMY     TOTAL HIP ARTHROPLASTY Left 12/19/2015   Procedure: LEFT TOTAL HIP ARTHROPLASTY;  Surgeon: Maude LELON Right, MD;  Location: [facility];  Service: Orthopedics;  Laterality: Left;   TRIGGER FINGER RELEASE     TUBAL LIGATION      Allergies:  Allergies[1]  Family History:  Family History  Problem Relation Age of Onset   Uterine cancer Mother 71   Heart attack Mother    Hypertension Mother    Kidney cancer Father 8   Lung cancer Father    Diabetes Sister    Thyroid  disease Neg Hx    Colon cancer Neg Hx    Breast cancer Neg Hx    Ovarian cancer Neg Hx    Endometrial cancer Neg Hx    Pancreatic cancer Neg Hx    Prostate cancer Neg Hx     Social History:  Social History[2]  Review of symptoms:  Constitutional:  Negative for unexplained weight loss, night sweats, fever, chills ENT:  Negative for nose bleeds, sinus pain, painful swallowing CV:  Negative for chest pain, shortness of breath, exercise intolerance, palpitations, loss of consciousness Resp:  Negative for cough, wheezing, shortness of breath GI:  Negative for nausea, vomiting, diarrhea, bloody stools GU:  Positives noted in HPI; otherwise negative for gross hematuria, dysuria, urinary incontinence Neuro:  Negative for seizures, poor balance, limb weakness, slurred speech Psych:  Negative for lack of energy, depression, anxiety Endocrine:  Negative for polydipsia, polyuria, symptoms of hypoglycemia (dizziness, hunger, sweating) Hematologic:  Negative for anemia, purpura, petechia, prolonged or excessive bleeding, use of anticoagulants  Allergic:  Negative for difficulty breathing or choking as a result of exposure to anything; no shellfish allergy; no allergic response (rash/itch) to materials, foods  Physical exam: BP 117/74   Pulse 77   Ht 5' 3 (1.6 m)   Wt 133 lb (60.3 kg)   BMI 23.56 kg/m  GENERAL APPEARANCE:  Well appearing, well developed, well  nourished, NAD HEENT:  Atraumatic, normocephalic, oropharynx clear NECK:  Supple without lymphadenopathy or thyromegaly ABDOMEN:  Soft, non-tender, no masses EXTREMITIES:  Moves all extremities well, without clubbing, cyanosis, or edema NEUROLOGIC:  Alert and oriented x 3, normal gait, CN II-XII grossly intact MENTAL STATUS:  appropriate BACK:  Non-tender to palpation, No CVAT SKIN:  Warm, dry, and intact  Results: Results for orders placed or performed in visit on 08/02/24 (from the past 24 hours)  Urinalysis, Routine w reflex microscopic     Status: Abnormal   Collection Time: 08/02/24 12:00 AM  Result Value Ref Range   Specific Gravity, UA 1.020 1.005 - 1.030   pH, UA 5.5 5.0 - 7.5   Color, UA Yellow Yellow   Appearance Ur Clear Clear   Leukocytes,UA Negative Negative   Protein,UA Negative Negative/Trace   Glucose, UA Negative Negative   Ketones, UA Trace (A) Negative   RBC, UA Trace (A) Negative   Bilirubin, UA Negative Negative   Urobilinogen, Ur 0.2 0.2 -  1.0 mg/dL   Nitrite, UA Negative Negative   Microscopic Examination See below:    Narrative   Performed at:  [address] [facility]  Urology [facility] [address], [place], [place]  [id number] Lab Director: Greig Chang MT, Phone:  [phone]  Microscopic Examination     Status: None   Collection Time: 08/02/24 12:00 AM   Urine  Result Value Ref Range   WBC, UA 0-5 0 - 5 /hpf   RBC, Urine 0-2 0 - 2 /hpf   Epithelial Cells (non renal) 0-10 0 - 10 /hpf   Bacteria, UA Few None seen/Few   Narrative   Performed at:  [facility]  Urology [facility] [address], [place], [place]  [id number] Lab Director: Greig Chang MT, Phone:  [phone]     CYSTOSCOPY  Procedure: Flexible cystoscopy  Pre-Operative Diagnosis:  Atypical urine cytology, metastatic carcinoma   Post-Operative Diagnosis: Atypical urine cytology, metastatic carcinoma, normal bladder  Anesthesia: local with lidocaine   gel  Surgical Narrative:  After appropriate informed consent was obtained, the patient was prepped and draped in the usual sterile fashion in the supine position. She was correctly identified and the proper procedure delineated prior to proceeding. Sterile lidocaine  gel was instilled in the urethra.  The flexible cystoscope was introduced without difficulty.  Findings:  Urethra: Normal  Bladder: No mucosal lesions seen throughout the bladder  Ureteral orifices: normal with clear efflux bilaterally  Additional findings: none  A bladder wash was obtained for cytology.  She tolerated the procedure well.  A chaperone was present throughout the procedure.     [1]  Allergies Allergen Reactions   Sulfa Antibiotics Swelling and Other (See Comments)    Caused tics and the tongue became swollen for approx a week  [2]  Social History Tobacco Use   Smoking status: Never   Smokeless tobacco: Never  Vaping Use   Vaping status: Never Used  Substance Use Topics   Alcohol  use: Yes    Alcohol /week: 0.0 standard drinks of alcohol     Comment: Rare   Drug use: No   "

## 2024-08-03 ENCOUNTER — Encounter: Payer: Self-pay | Admitting: Hematology and Oncology

## 2024-08-04 ENCOUNTER — Ambulatory Visit (HOSPITAL_COMMUNITY): Admission: RE | Admit: 2024-08-04

## 2024-08-04 DIAGNOSIS — E109 Type 1 diabetes mellitus without complications: Secondary | ICD-10-CM

## 2024-08-04 DIAGNOSIS — Z79899 Other long term (current) drug therapy: Secondary | ICD-10-CM

## 2024-08-04 LAB — ECHOCARDIOGRAM COMPLETE
Area-P 1/2: 3.6 cm2
S' Lateral: 2.67 cm

## 2024-08-07 ENCOUNTER — Ambulatory Visit

## 2024-08-08 ENCOUNTER — Ambulatory Visit: Admitting: Hematology and Oncology

## 2024-08-15 ENCOUNTER — Inpatient Hospital Stay: Attending: Gynecologic Oncology

## 2024-08-15 ENCOUNTER — Inpatient Hospital Stay: Admitting: Hematology and Oncology

## 2024-08-16 ENCOUNTER — Inpatient Hospital Stay

## 2024-08-21 ENCOUNTER — Inpatient Hospital Stay: Admitting: Psychiatry

## 2024-08-29 ENCOUNTER — Ambulatory Visit: Admitting: Internal Medicine
